# Patient Record
Sex: Male | Born: 1980
Health system: Southern US, Community
[De-identification: ages and names within clinical notes are randomized; demographics above are authoritative.]

## PROBLEM LIST (undated history)

## (undated) DIAGNOSIS — R002 Palpitations: Secondary | ICD-10-CM

## (undated) DIAGNOSIS — I7789 Other specified disorders of arteries and arterioles: Secondary | ICD-10-CM

## (undated) DIAGNOSIS — I441 Atrioventricular block, second degree: Secondary | ICD-10-CM

## (undated) DIAGNOSIS — Z95810 Presence of automatic (implantable) cardiac defibrillator: Secondary | ICD-10-CM

## (undated) DIAGNOSIS — F329 Major depressive disorder, single episode, unspecified: Secondary | ICD-10-CM

## (undated) DIAGNOSIS — I4892 Unspecified atrial flutter: Secondary | ICD-10-CM

## (undated) DIAGNOSIS — I480 Paroxysmal atrial fibrillation: Secondary | ICD-10-CM

## (undated) DIAGNOSIS — F419 Anxiety disorder, unspecified: Secondary | ICD-10-CM

## (undated) DIAGNOSIS — I5022 Chronic systolic (congestive) heart failure: Secondary | ICD-10-CM

## (undated) DIAGNOSIS — J302 Other seasonal allergic rhinitis: Secondary | ICD-10-CM

## (undated) DIAGNOSIS — T7840XA Allergy, unspecified, initial encounter: Secondary | ICD-10-CM

## (undated) DIAGNOSIS — I428 Other cardiomyopathies: Secondary | ICD-10-CM

## (undated) DIAGNOSIS — F32A Depression, unspecified: Secondary | ICD-10-CM

## (undated) DIAGNOSIS — I10 Essential (primary) hypertension: Secondary | ICD-10-CM

## (undated) DIAGNOSIS — I484 Atypical atrial flutter: Secondary | ICD-10-CM

## (undated) DIAGNOSIS — G4733 Obstructive sleep apnea (adult) (pediatric): Principal | ICD-10-CM

## (undated) DIAGNOSIS — G4730 Sleep apnea, unspecified: Secondary | ICD-10-CM

## (undated) DIAGNOSIS — I152 Hypertension secondary to endocrine disorders: Secondary | ICD-10-CM

## (undated) DIAGNOSIS — Z7901 Long term (current) use of anticoagulants: Secondary | ICD-10-CM

## (undated) DIAGNOSIS — E1159 Type 2 diabetes mellitus with other circulatory complications: Secondary | ICD-10-CM

## (undated) DIAGNOSIS — L309 Dermatitis, unspecified: Secondary | ICD-10-CM

## (undated) HISTORY — DX: Other seasonal allergic rhinitis: J30.2

## (undated) HISTORY — DX: Obstructive sleep apnea (adult) (pediatric): G47.33

## (undated) HISTORY — DX: Other cardiomyopathies: I42.8

## (undated) HISTORY — DX: Palpitations: R00.2

## (undated) HISTORY — DX: Other specified disorders of arteries and arterioles: I77.89

## (undated) HISTORY — DX: Atypical atrial flutter: I48.4

## (undated) HISTORY — DX: Anxiety disorder, unspecified: F41.9

## (undated) HISTORY — DX: Chronic systolic (congestive) heart failure: I50.22

## (undated) HISTORY — DX: Hypertension secondary to endocrine disorders: I15.2

## (undated) HISTORY — DX: Sleep apnea, unspecified: G47.30

## (undated) HISTORY — DX: Allergy, unspecified, initial encounter: T78.40XA

## (undated) HISTORY — DX: Depression, unspecified: F32.A

## (undated) HISTORY — DX: Long term (current) use of anticoagulants: Z79.01

## (undated) HISTORY — DX: Type 2 diabetes mellitus with other circulatory complications: E11.59

---
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
## 2009-03-19 ENCOUNTER — Inpatient Hospital Stay (HOSPITAL_COMMUNITY): Admission: EM | Admit: 2009-03-19 | Discharge: 2009-03-26 | Payer: Self-pay | Admitting: Cardiology

## 2009-03-19 ENCOUNTER — Encounter: Payer: Self-pay | Admitting: Emergency Medicine

## 2009-03-19 IMAGING — CR DG CHEST 2V
2 series · 2 of 2 positions shown · non-contrast
Comparison: None

CLINICAL DATA: Shortness of breath and cough.

CHEST - 2 VIEW

[w chest pa]
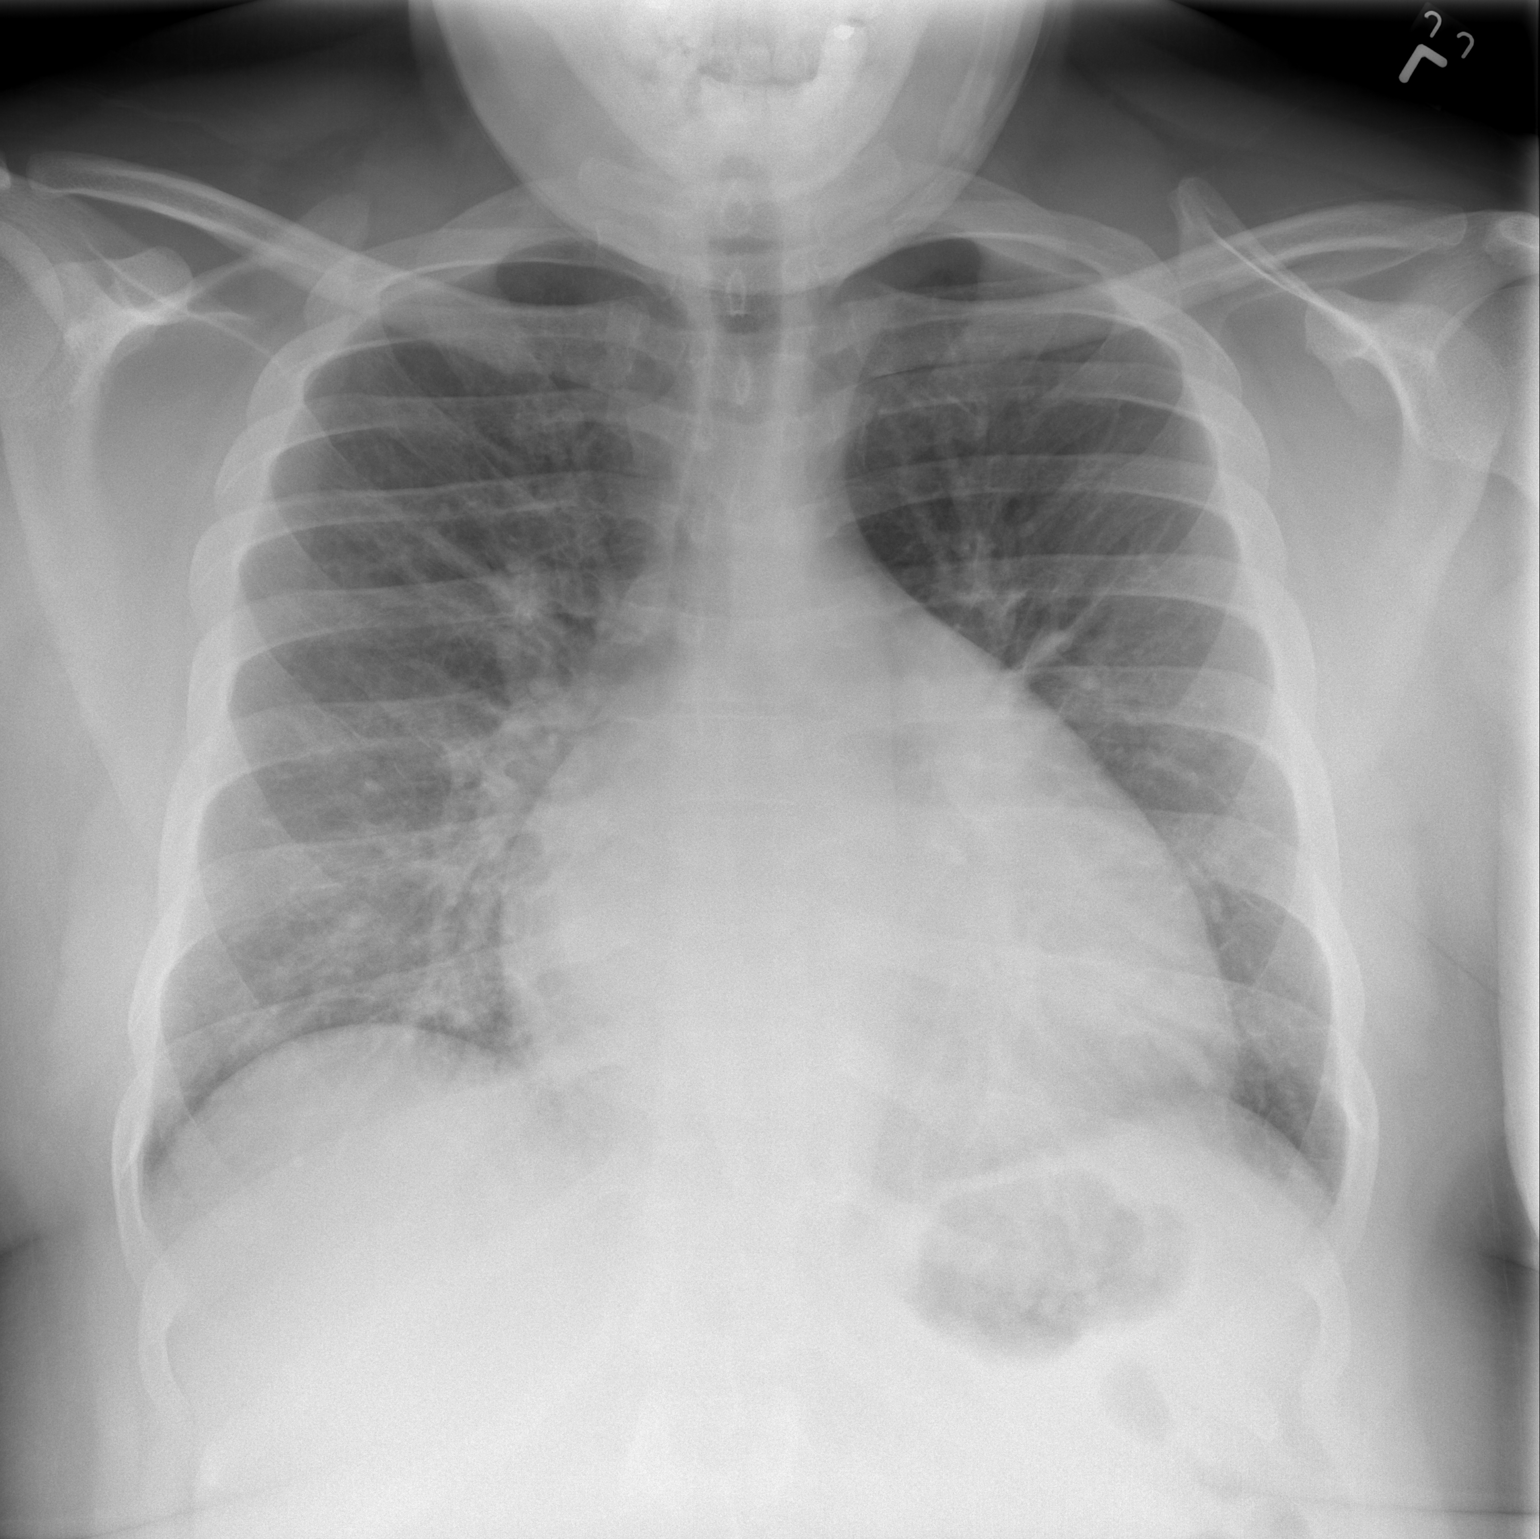

[w chest lat *]
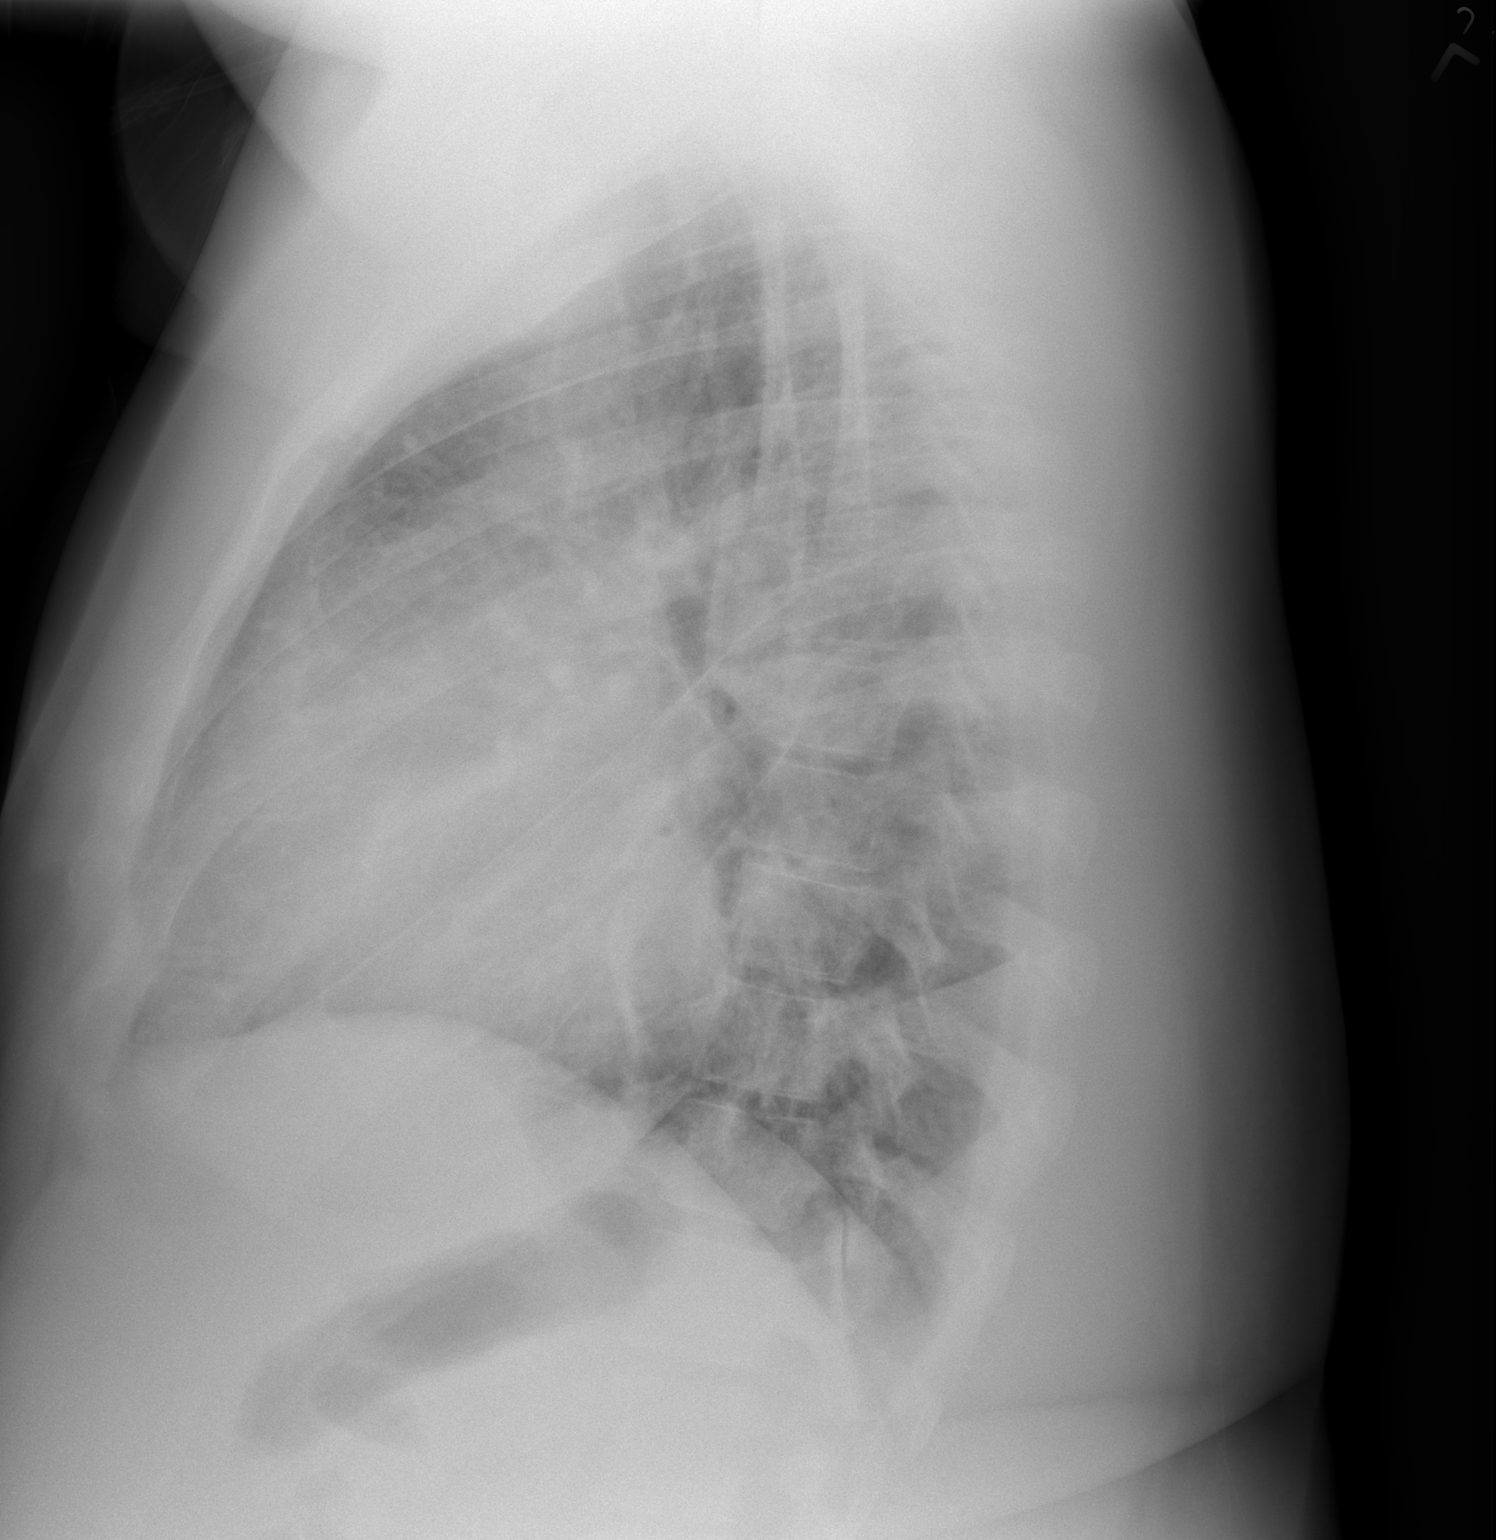

[2 of 2 positions shown; findings below may reference images not displayed]

FINDINGS: The patient has marked cardiomegaly with bilateral
interstitial pulmonary edema, mild.  There are no effusions.  The
globular configuration heart suggests that the patient could have a
pericardial effusion.

Bony structures are normal.
IMPRESSION: Marked enlargement of the cardiac silhouette  with mild vascular
congestion and mild interstitial edema.

## 2009-03-20 ENCOUNTER — Encounter (INDEPENDENT_AMBULATORY_CARE_PROVIDER_SITE_OTHER): Payer: Self-pay | Admitting: Cardiology

## 2009-03-20 IMAGING — CT CT ANGIO CHEST
4 of 6 series · 14 of 30 positions shown · IV contrast (100 ML OMNI 300)
Comparison: Chest radiograph 03/19/2009.

CLINICAL DATA: 28-year-old male with chest pain and shortness of
breath.  History of congestive heart failure.

CT ANGIOGRAPHY CHEST WITH CONTRAST
TECHNIQUE: Multidetector CT imaging of the chest was performed
using the standard protocol during bolus administration of
intravenous contrast. Multiplanar CT image reconstructions
including MIPs were obtained to evaluate the vascular anatomy.
Contrast: 100 ml [id number].

[Series 2: pe · axial · 0.77mm/px · z∈[-285,-78]mm · 6 of 250 slices shown]
[im 42/250  lung]
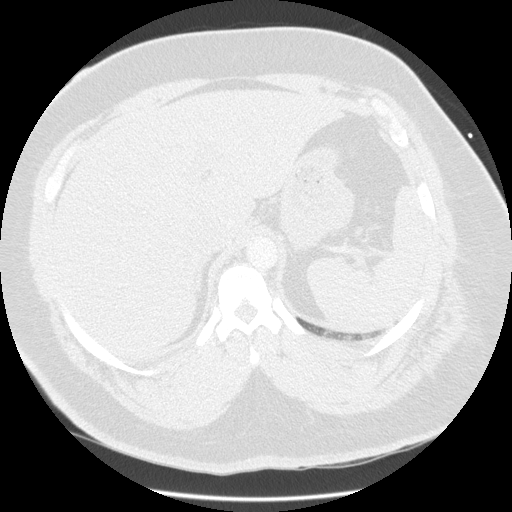
[im 84/250  mediastinal]
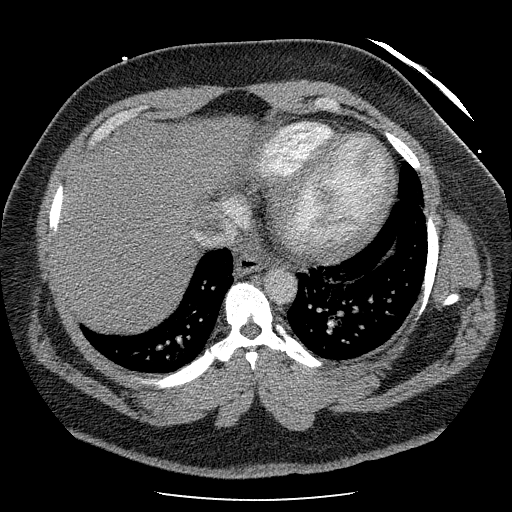
[im 125/250  lung]
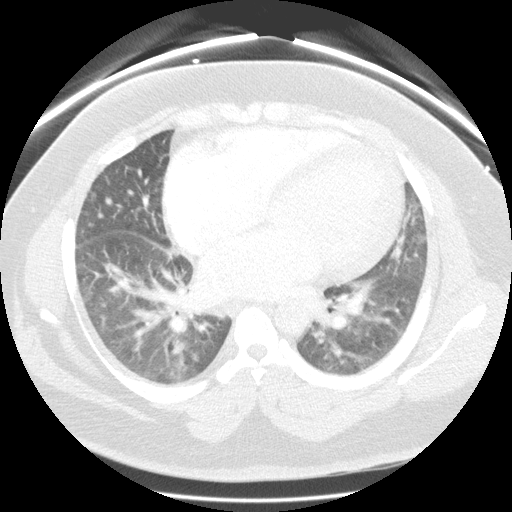
[im 135/250  mediastinal]
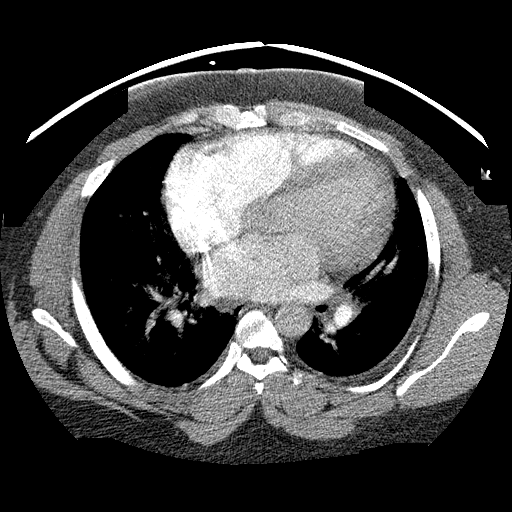
[im 167/250  lung]
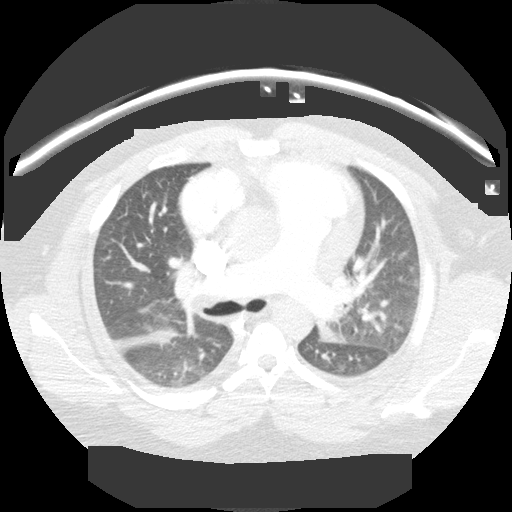
[im 208/250  mediastinal]
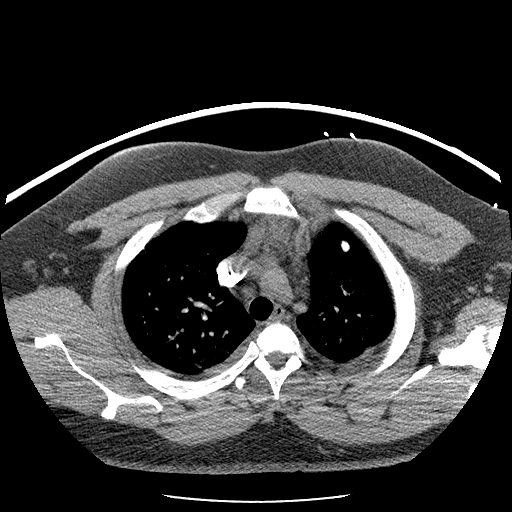

[Series 3: recon 2: pe · axial · 0.77mm/px · z∈[-180,-168]mm · 2 of 125 slices shown]
[im 63/125  lung]
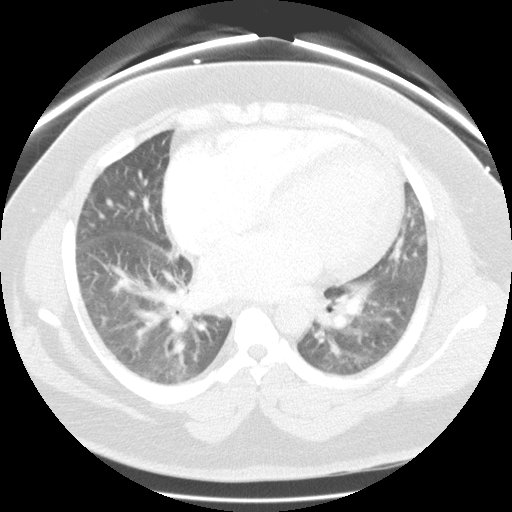
[im 68/125  lung]
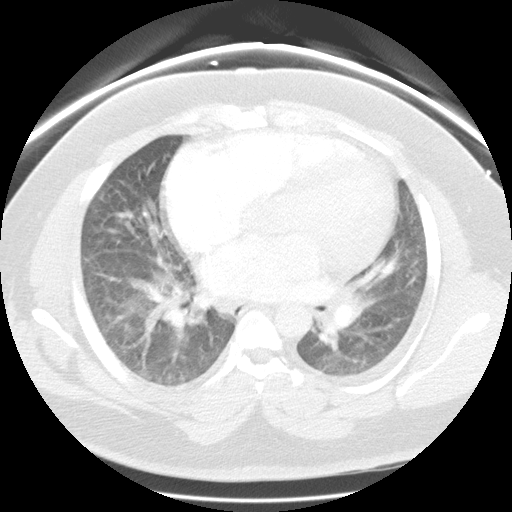

[Series 202: reformatted · sagittal · 0.77mm/px · 3 of 197 slices shown (1 of 2)]
[im 50/197  lung]
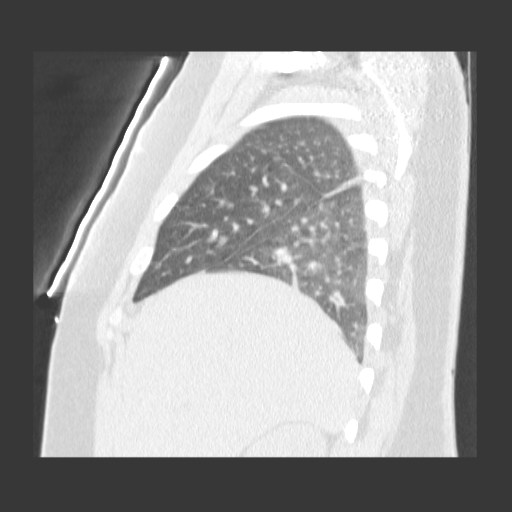
[im 99/197  lung]
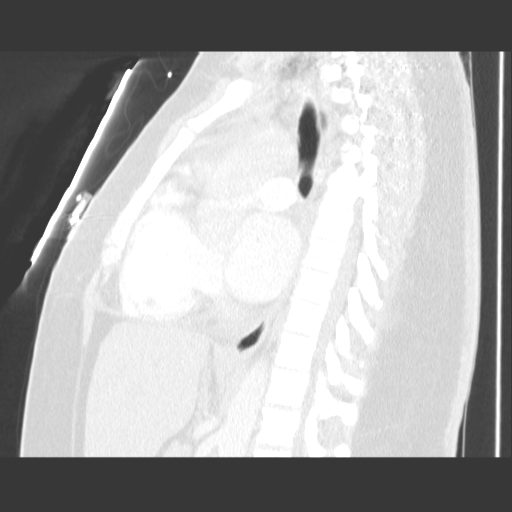
[im 148/197  lung]
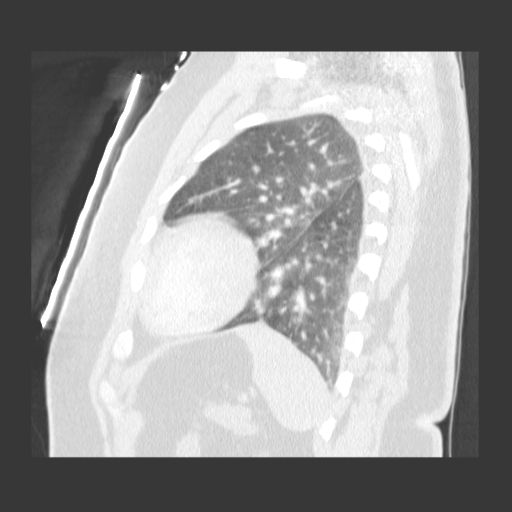

[Series 204: reformatted · sagittal · 0.77mm/px · 3 of 188 slices shown (2 of 2)]
[im 47/188  lung]
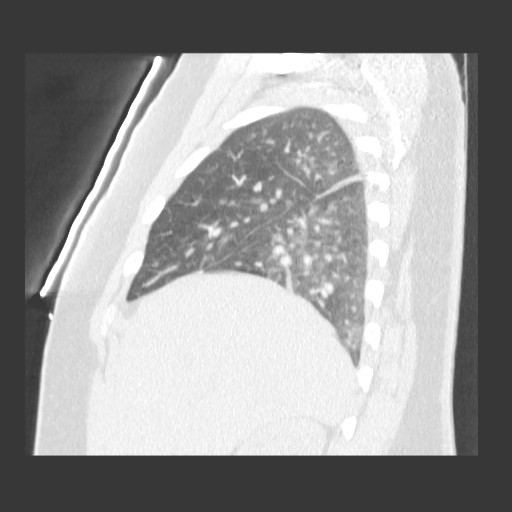
[im 94/188  lung]
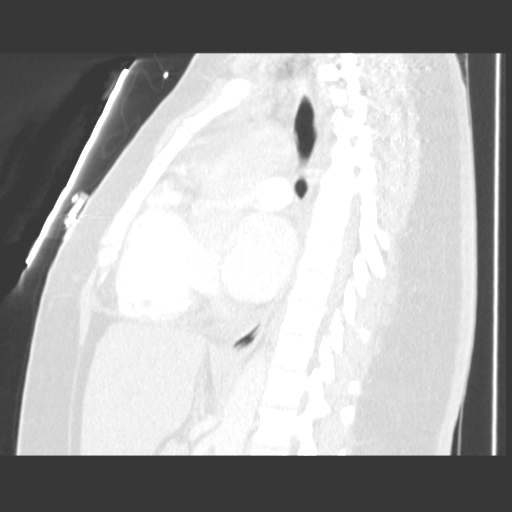
[im 141/188  lung]
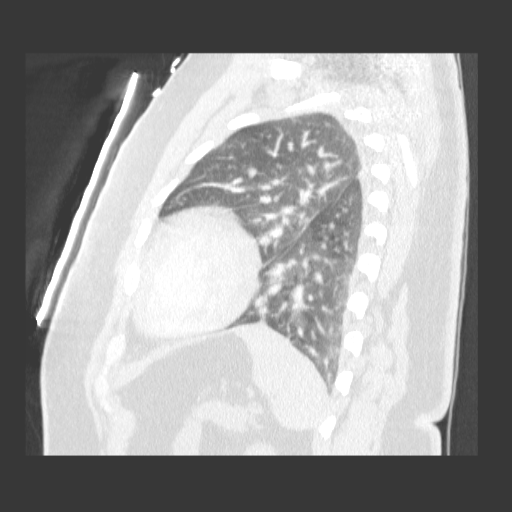

[14 of 30 positions shown; findings below may reference images not displayed]

FINDINGS: Adequate contrast bolus timing in the pulmonary arterial
tree.  Mild respiratory motion artifact. No focal filling defect
identified in the pulmonary arterial tree to suggest the presence
of acute pulmonary embolism.

Diffuse ground-glass opacity with superimposed dependent
atelectasis and mild peribronchovascular atelectasis bilaterally.
No airspace consolidation.  Major airways are patent.

Marked cardiomegaly.  No pericardial or pleural effusion.  Probable
residual thymus in the superior mediastinum given this patient's
age.  No lymphadenopathy.  Visualized upper abdominal viscera are
within normal limits.

No acute osseous abnormality identified.

Review of the MIP images confirms the above findings.
IMPRESSION: 1. No evidence of acute pulmonary embolus.
2.  Marked cardiomegaly.  Probable acute pulmonary edema with
superimposed atelectasis.  No pleural or pericardial effusion.

## 2009-03-20 IMAGING — CR DG CHEST 1V PORT
1 series · 1 of 1 positions shown · non-contrast
Comparison: 03/19/2009

CLINICAL DATA: Atrial flutter and CHF.

PORTABLE CHEST - 1 VIEW

[AP]
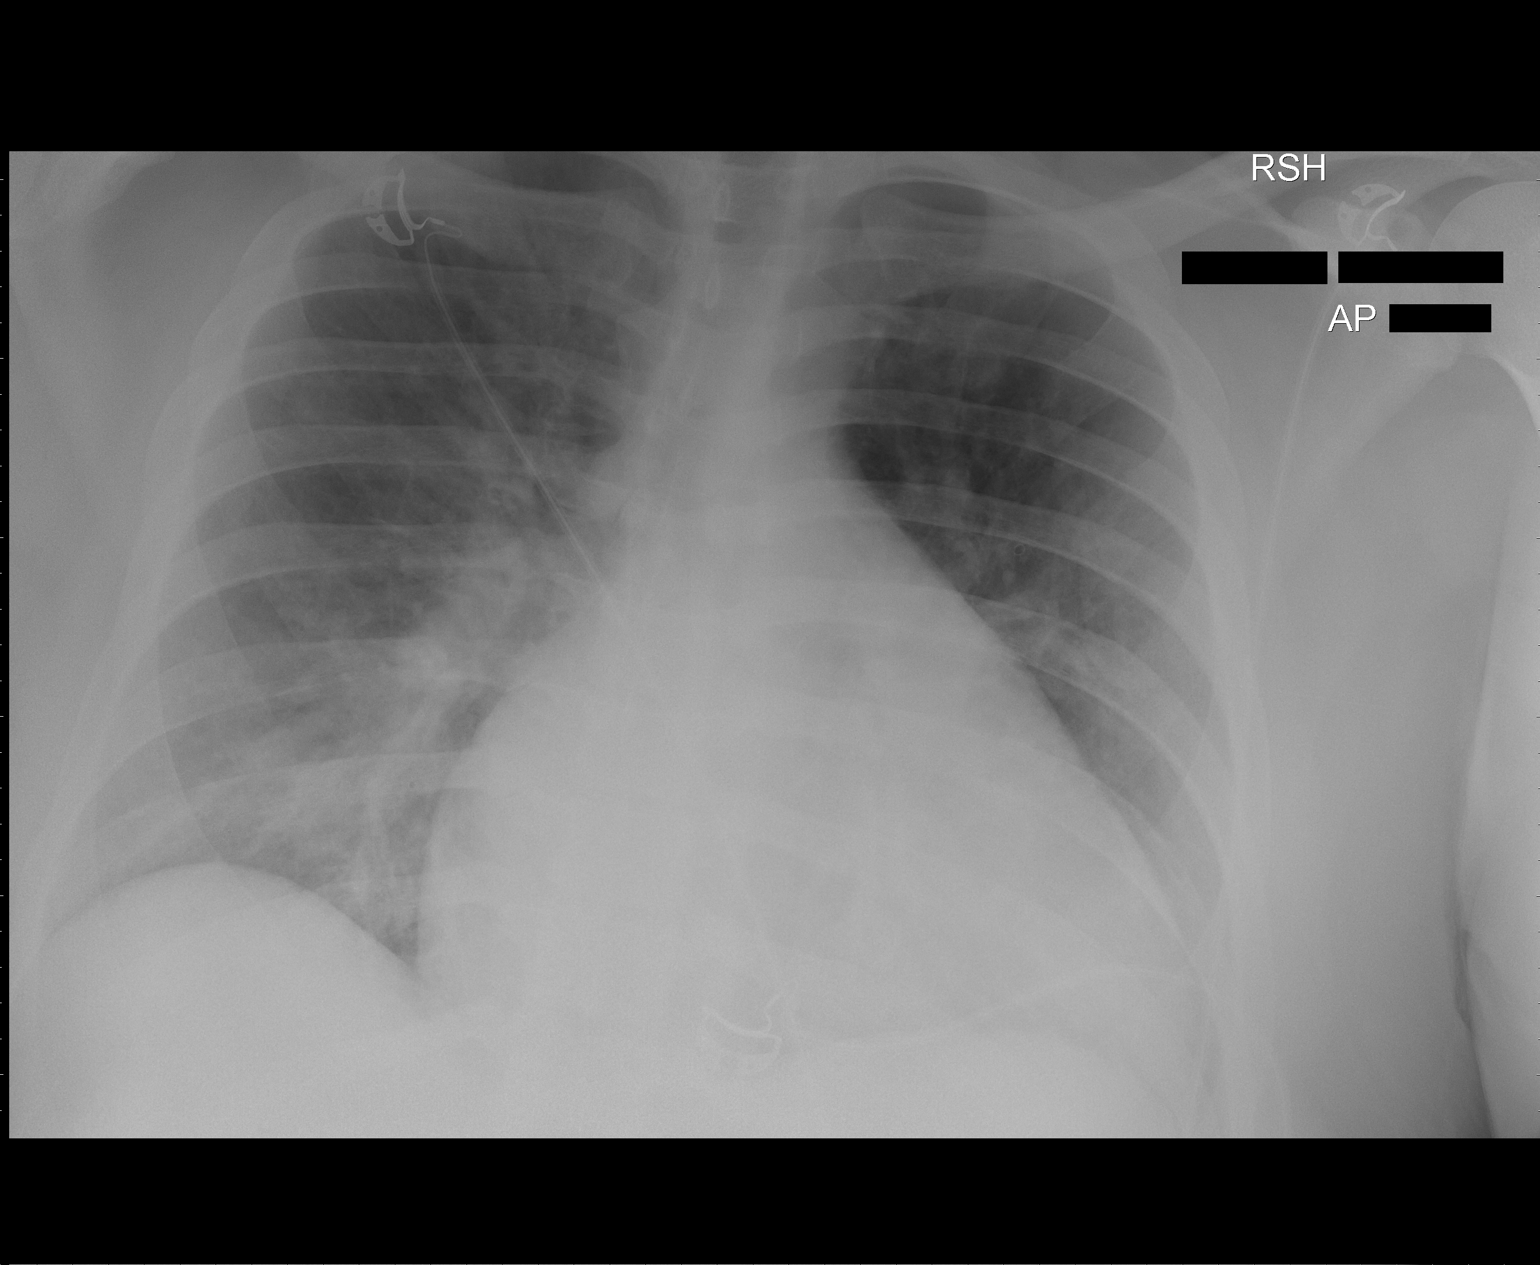

[1 of 1 positions shown; findings below may reference images not displayed]

FINDINGS: The heart remains enlarged with persistent vascular
congestion and mild interstitial edema.  No pleural effusions.
Streaky bibasilar atelectasis.  No pneumothorax.
IMPRESSION: Stable cardiac enlargement, vascular congestion and mild
interstitial edema.

## 2009-03-21 IMAGING — CR DG CHEST 1V PORT
1 series · 1 of 1 positions shown · non-contrast
Comparison: Portable chest x-ray of 03/20/2009

CLINICAL DATA: Atrial flutter, hypotension

PORTABLE CHEST - 1 VIEW

[view not recorded]
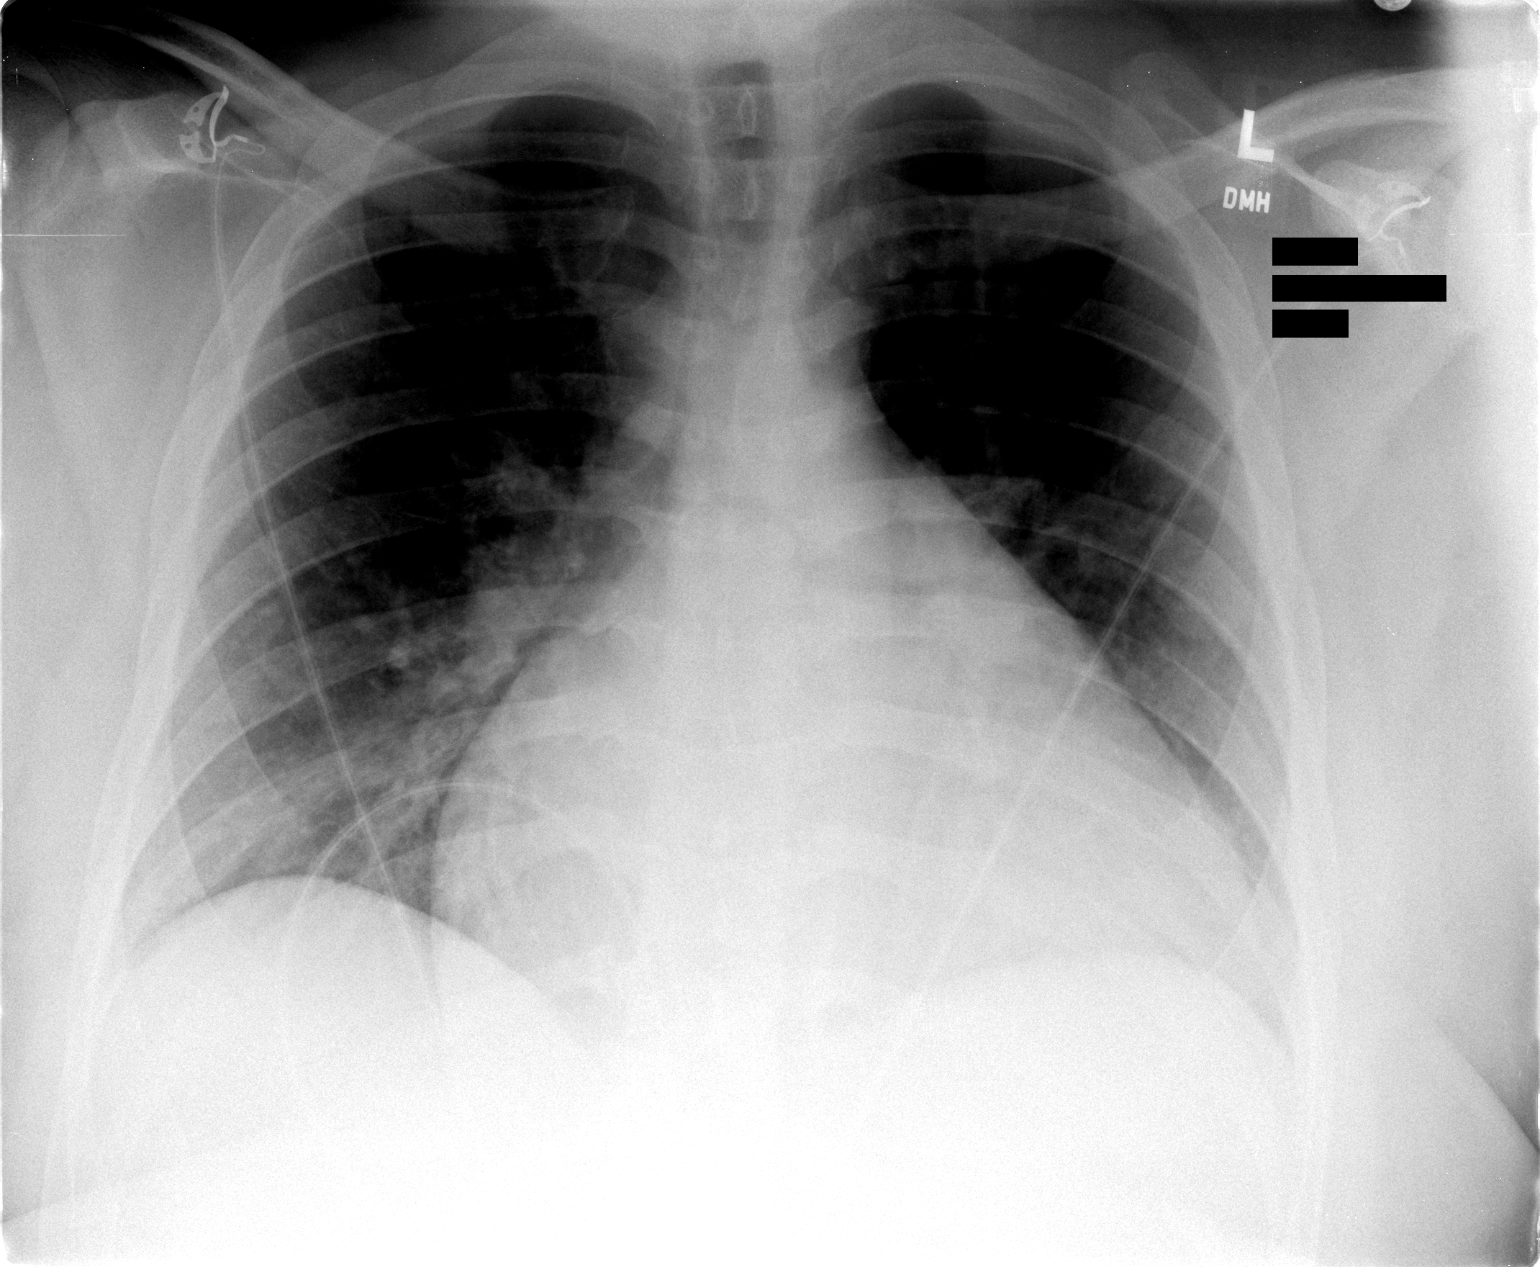

[1 of 1 positions shown; findings below may reference images not displayed]

FINDINGS: Moderate cardiomegaly is stable.  The degree of pulmonary
vascular congestion has improved slightly.  No effusion is seen.
No bony abnormality is noted.
IMPRESSION: Stable moderate cardiomegaly.  Some improvement in pulmonary
vascular congestion.

## 2009-03-24 ENCOUNTER — Encounter (INDEPENDENT_AMBULATORY_CARE_PROVIDER_SITE_OTHER): Payer: Self-pay | Admitting: Cardiology

## 2009-04-14 ENCOUNTER — Ambulatory Visit: Admission: RE | Admit: 2009-04-14 | Discharge: 2009-04-14 | Payer: Self-pay | Admitting: Cardiology

## 2010-02-10 ENCOUNTER — Encounter: Admission: RE | Admit: 2010-02-10 | Discharge: 2010-02-10 | Payer: Self-pay | Admitting: Cardiovascular Disease

## 2010-02-10 IMAGING — CR DG CHEST 2V
2 series · 2 of 2 positions shown · non-contrast
Comparison: 03/21/2009

CLINICAL DATA: Congestive heart failure.  Atrial fibrillation.

CHEST - 2 VIEW

[w chest pa]
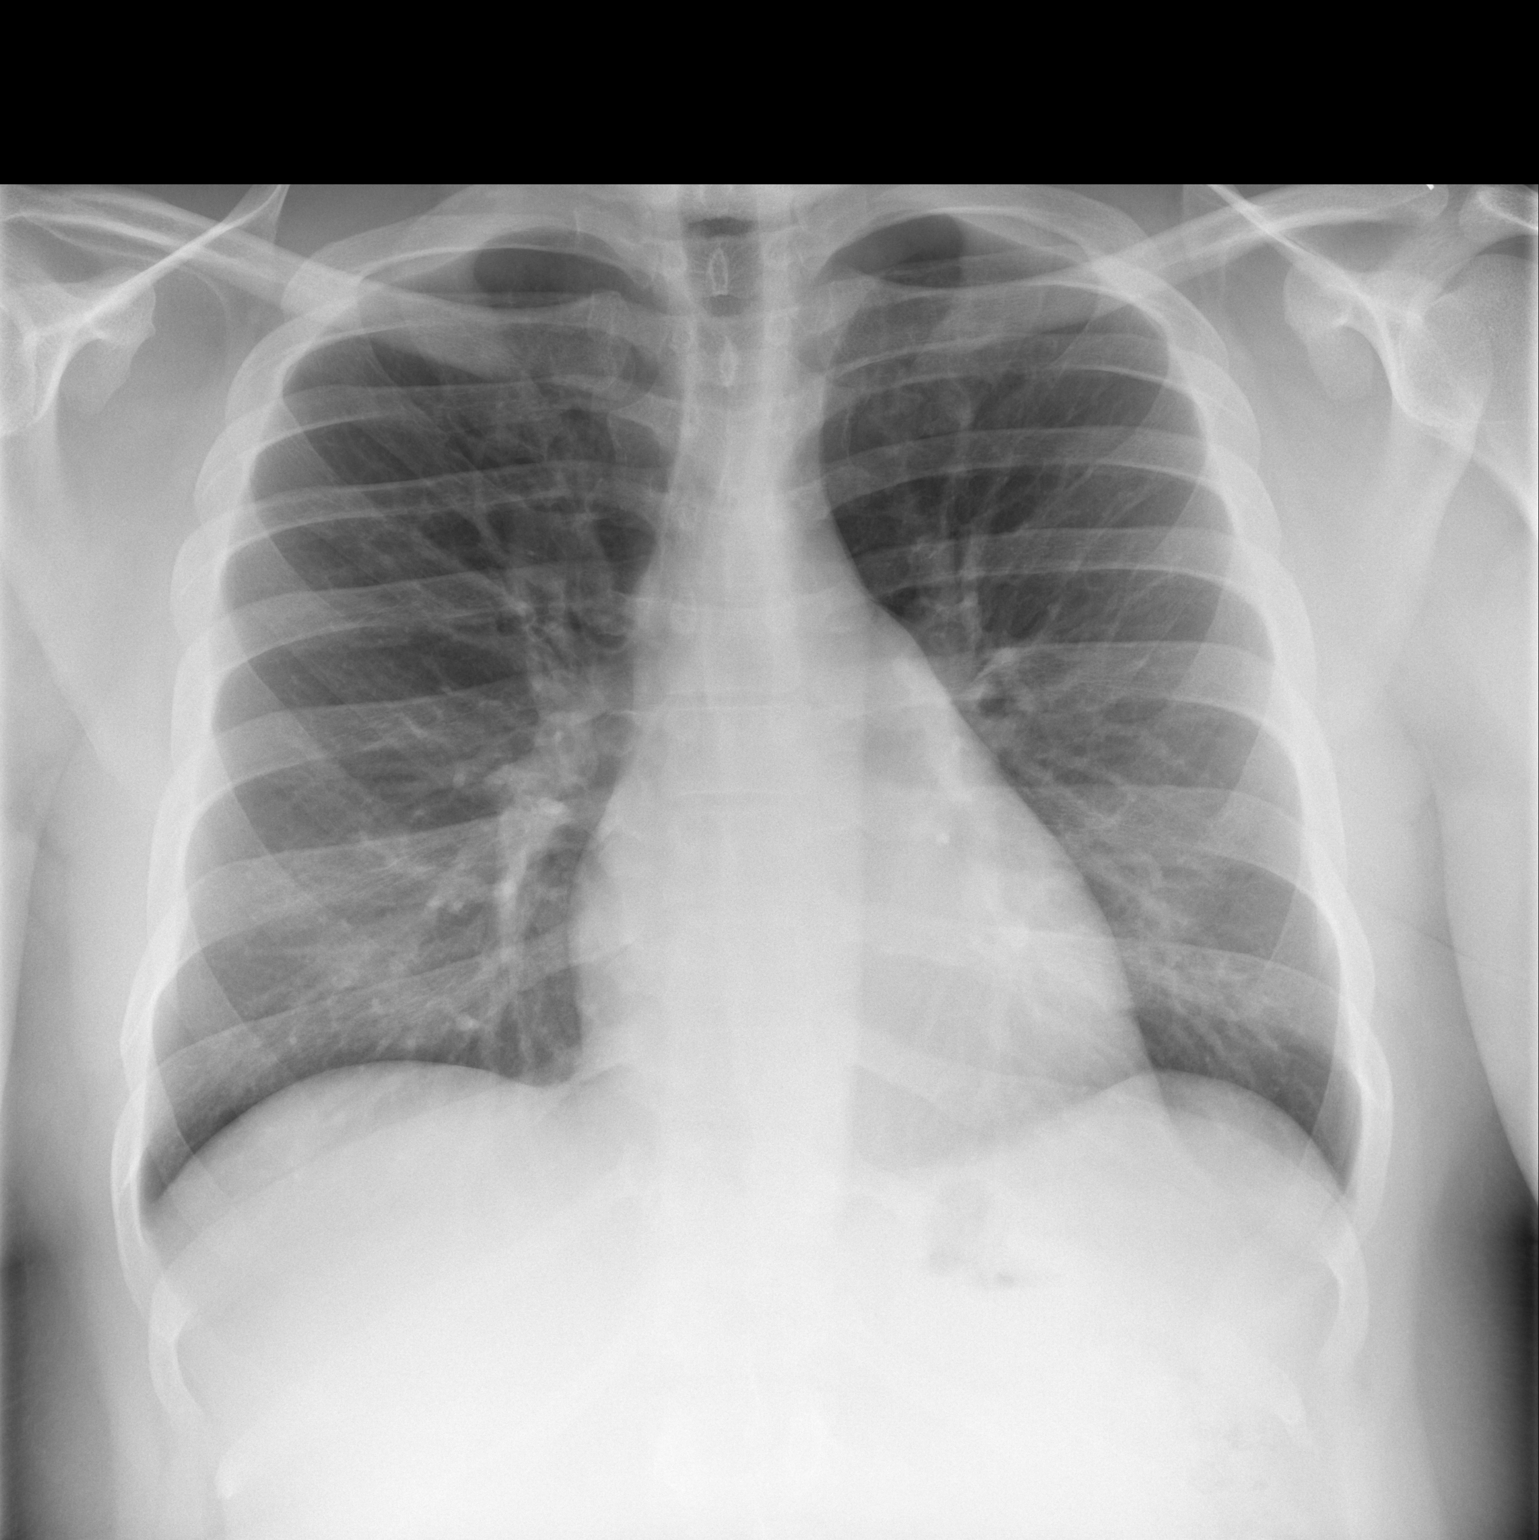

[w chest lat]
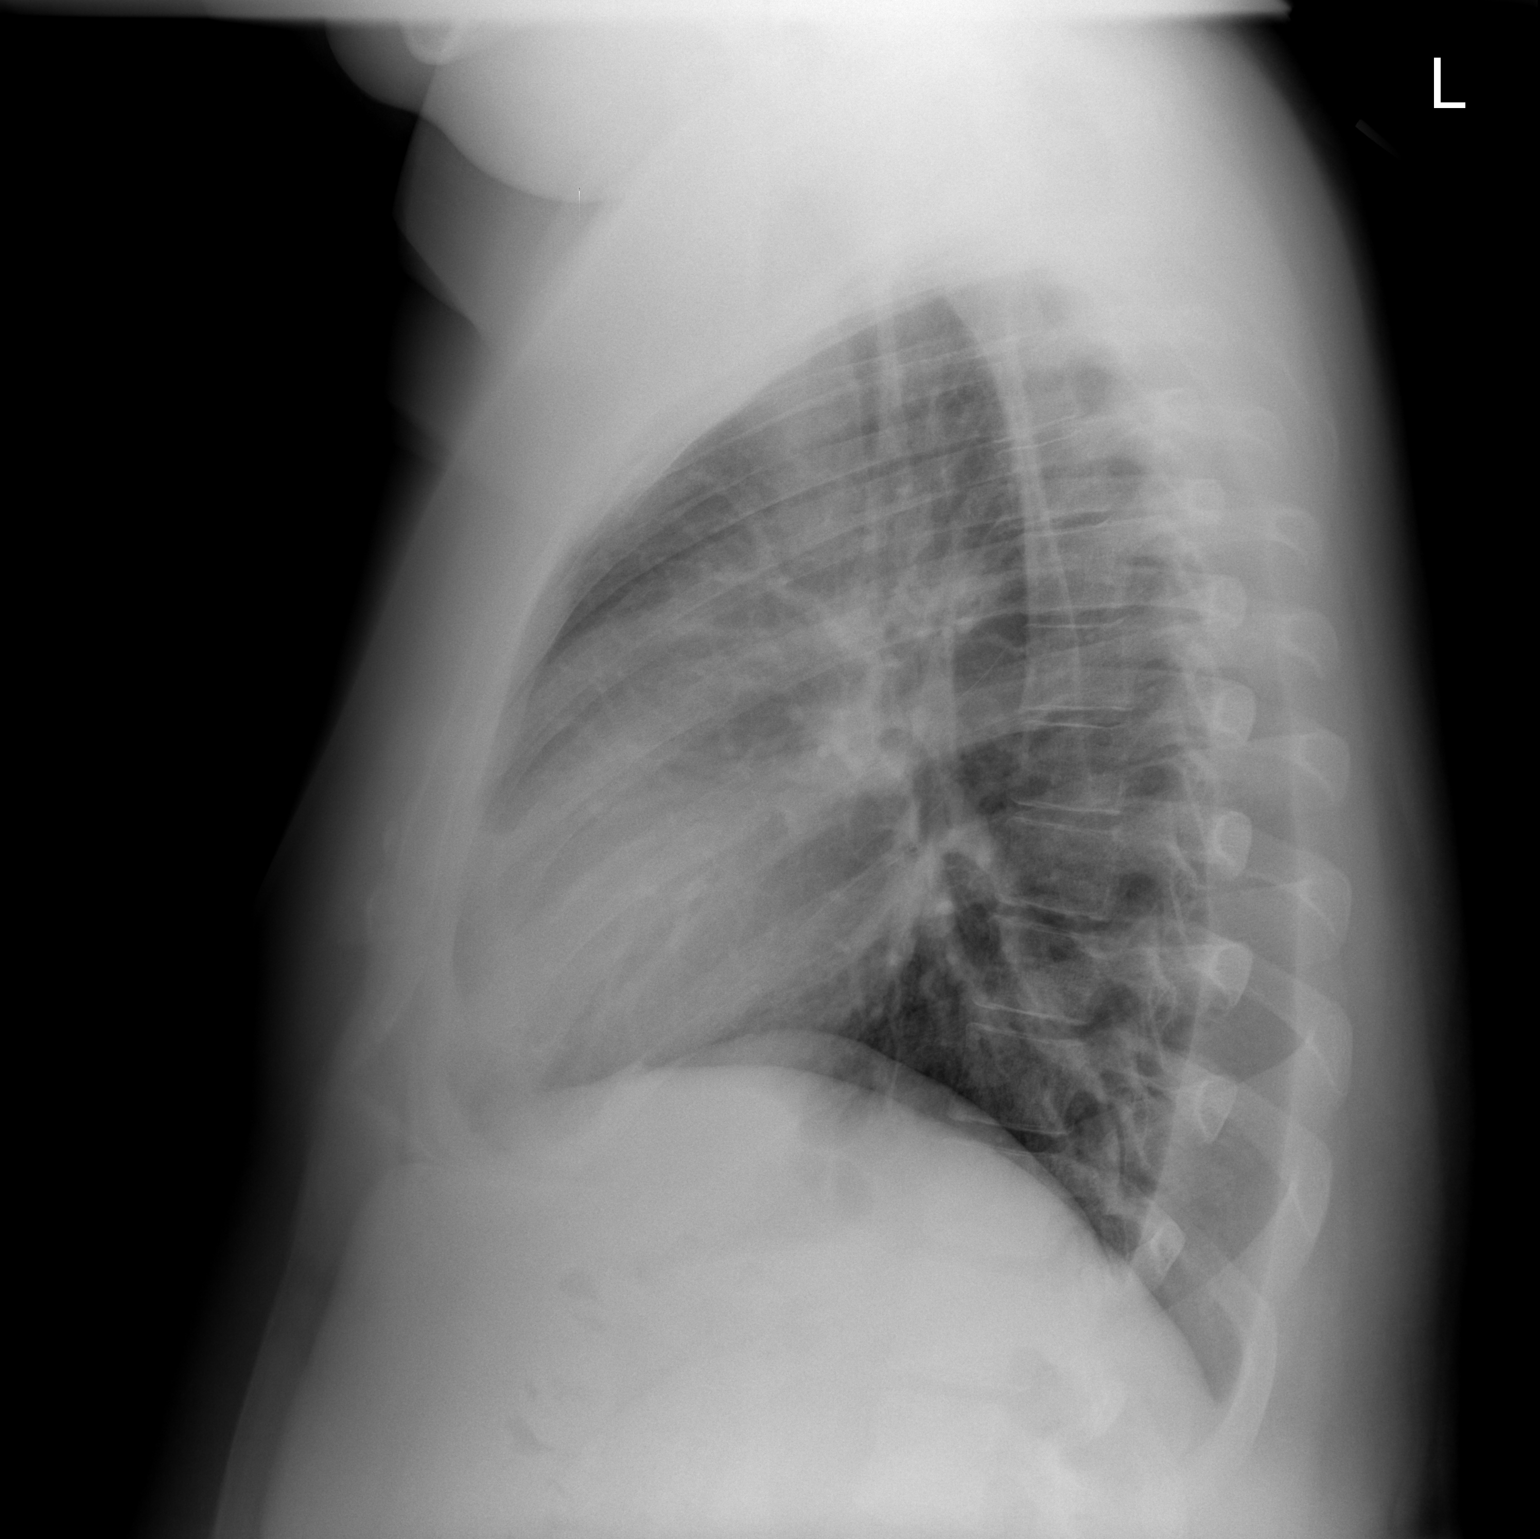

[2 of 2 positions shown; findings below may reference images not displayed]

FINDINGS: Significant decrease in size of cardiac silhouette seen
since prior exam.  The heart size and mediastinal contours are
within normal limits.  Both lungs are clear.  The visualized
skeletal structures are unremarkable.
IMPRESSION: No active cardiopulmonary disease.

## 2010-02-16 ENCOUNTER — Ambulatory Visit (HOSPITAL_COMMUNITY)
Admission: RE | Admit: 2010-02-16 | Discharge: 2010-02-17 | Payer: Self-pay | Source: Home / Self Care | Admitting: Cardiovascular Disease

## 2010-02-17 HISTORY — PX: CARDIAC DEFIBRILLATOR PLACEMENT: SHX171

## 2010-02-17 IMAGING — CR DG CHEST 2V
2 series · 2 of 2 positions shown · non-contrast
Comparison: 02/10/2010.

CLINICAL DATA: Congestive heart failure.  Status post pacemaker
placement.

CHEST - 2 VIEW

[w chest pa]
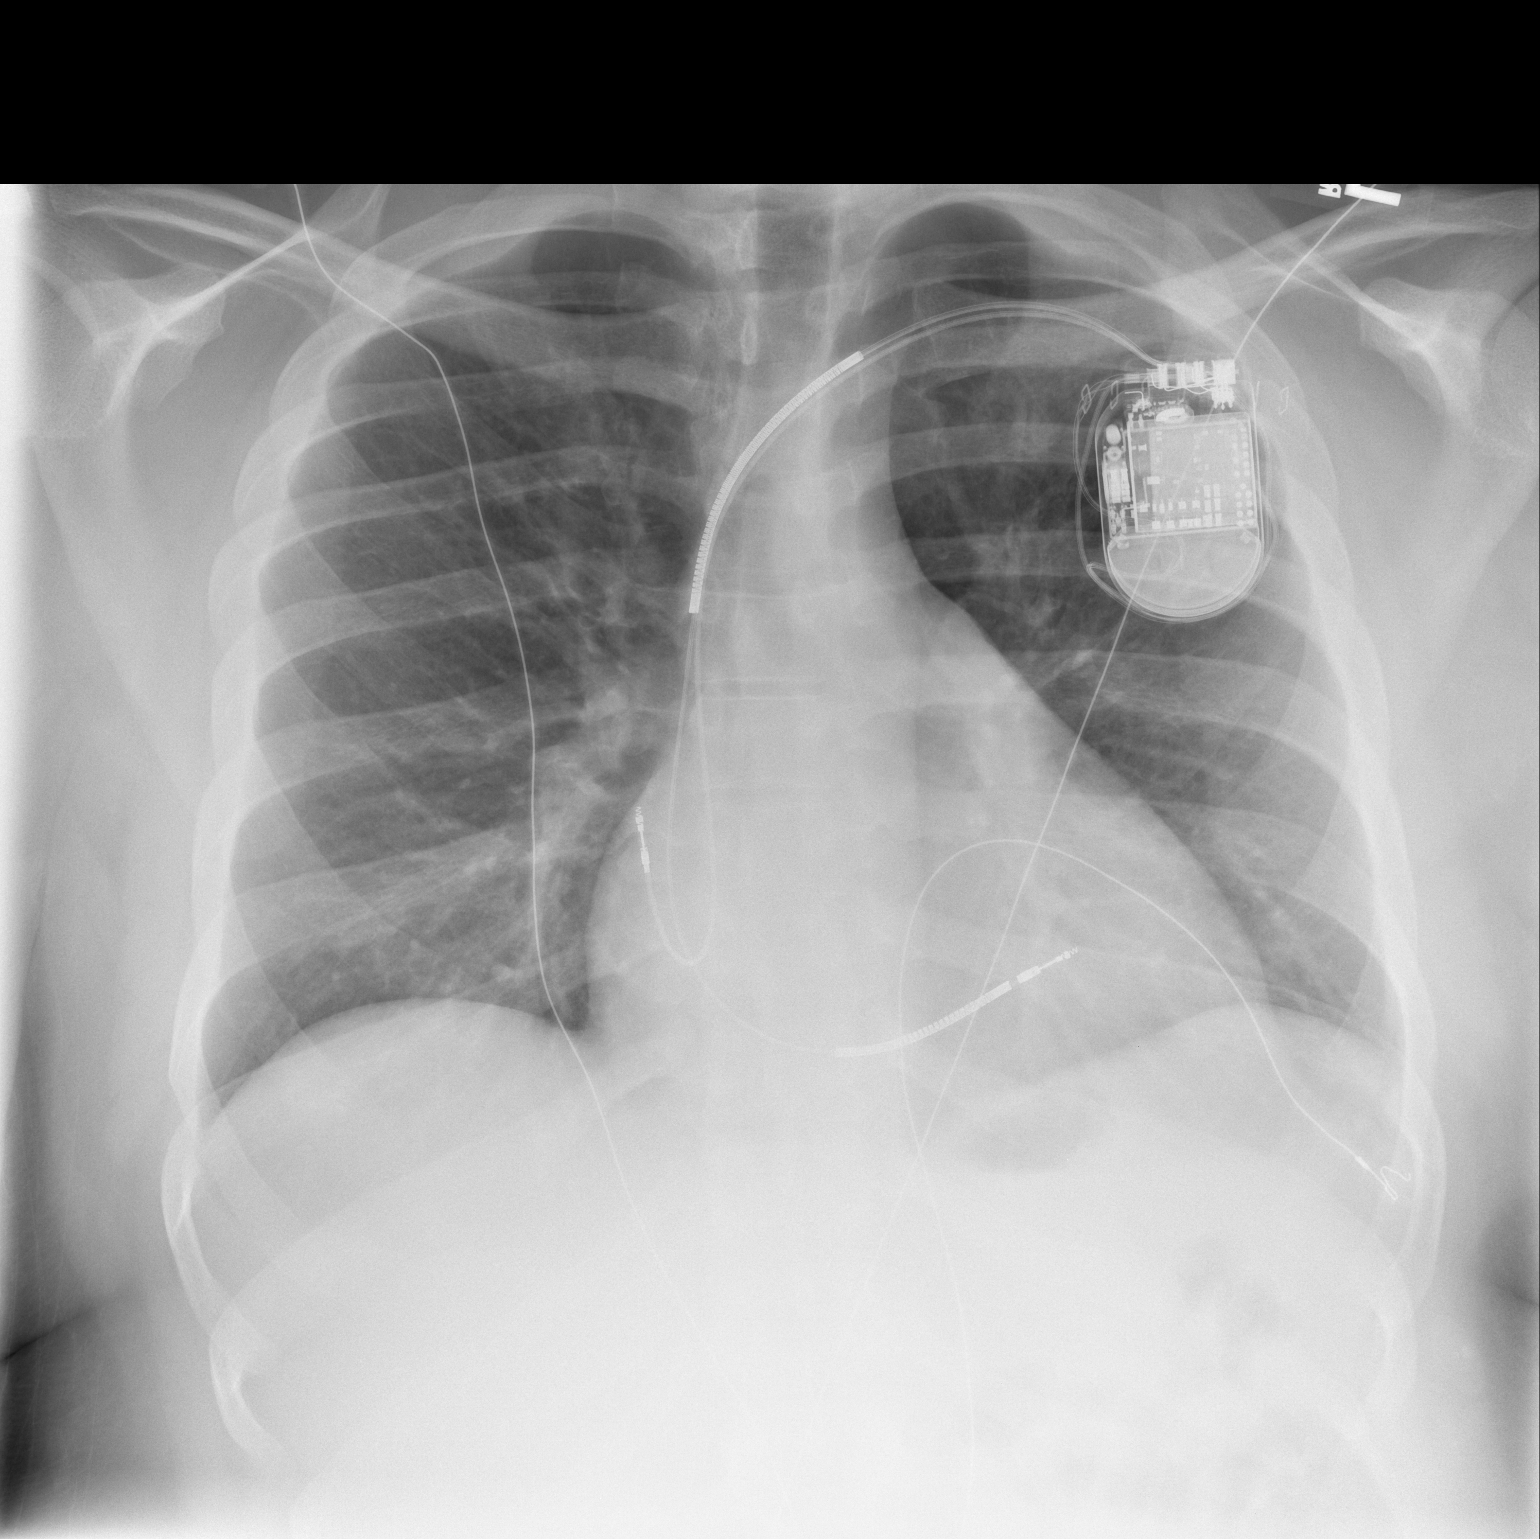

[w chest lat]
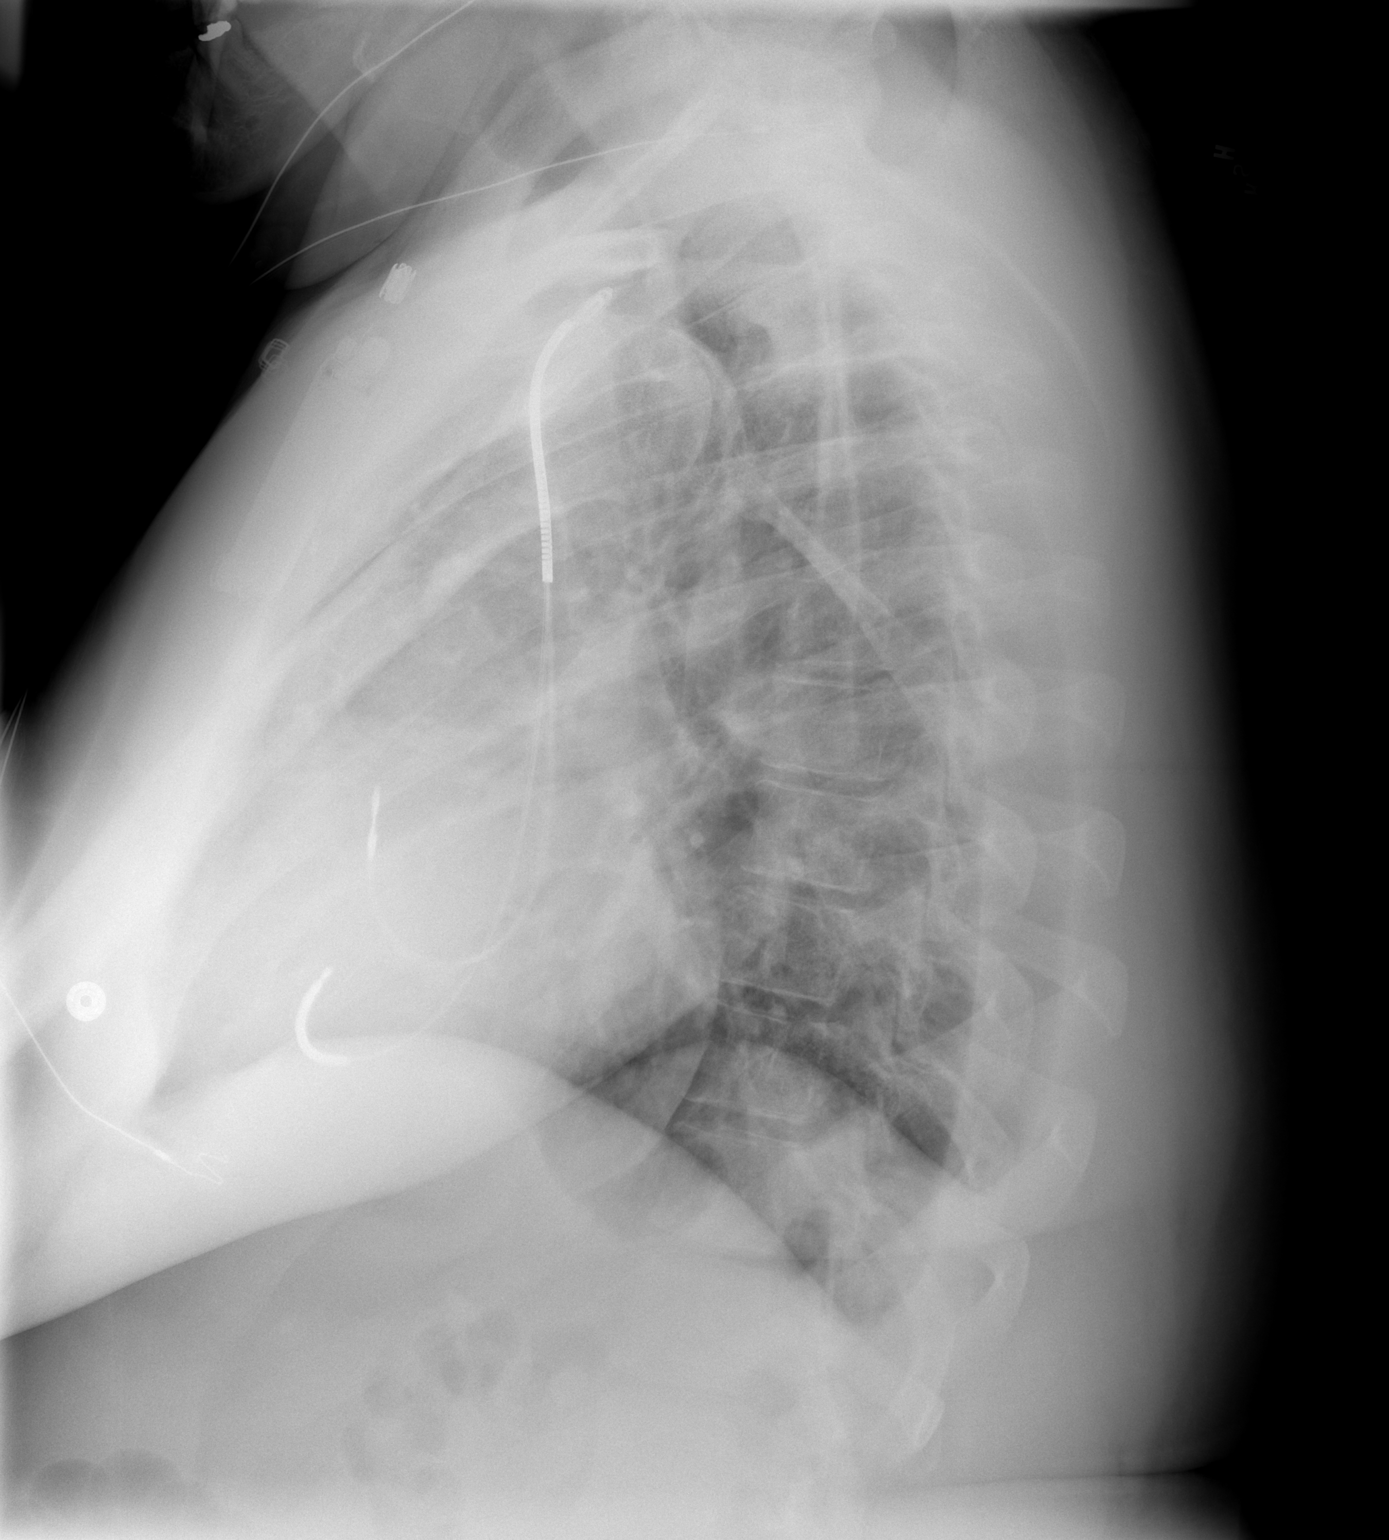

[2 of 2 positions shown; findings below may reference images not displayed]

FINDINGS: Heart size is mildly enlarged.  A dual lead pacemaker /
AICD has been placed.  The lung volumes are low.  Minimal bibasilar
atelectasis is evident.  There is no pneumothorax.  The visualized
soft tissues and bony thorax are unremarkable.
IMPRESSION: 1.  Interval placement of a dual lead pacemaker / AICD via a
subclavian approach without radiographic evidence for complication.
2.  Low volumes and mild bibasilar atelectasis.

## 2010-07-23 LAB — PROTIME-INR
INR: 1.18 (ref 0.00–1.49)
Prothrombin Time: 15.2 seconds (ref 11.6–15.2)

## 2010-07-23 LAB — SURGICAL PCR SCREEN: MRSA, PCR: NEGATIVE

## 2010-08-12 LAB — HEPATIC FUNCTION PANEL
ALT: 27 U/L (ref 0–53)
Alkaline Phosphatase: 35 U/L — ABNORMAL LOW (ref 39–117)
Indirect Bilirubin: 1.2 mg/dL — ABNORMAL HIGH (ref 0.3–0.9)
Total Protein: 7.1 g/dL (ref 6.0–8.3)

## 2010-08-12 LAB — IRON AND TIBC
Saturation Ratios: 16 % — ABNORMAL LOW (ref 20–55)
TIBC: 278 ug/dL (ref 215–435)

## 2010-08-12 LAB — APTT: aPTT: 32 seconds (ref 24–37)

## 2010-08-12 LAB — CBC
HCT: 43.6 % (ref 39.0–52.0)
HCT: 37.8 % — ABNORMAL LOW (ref 39.0–52.0)
HCT: 39.1 % (ref 39.0–52.0)
HCT: 40.5 % (ref 39.0–52.0)
HCT: 40.8 % (ref 39.0–52.0)
Hemoglobin: 12.4 g/dL — ABNORMAL LOW (ref 13.0–17.0)
Hemoglobin: 12.8 g/dL — ABNORMAL LOW (ref 13.0–17.0)
Hemoglobin: 13.4 g/dL (ref 13.0–17.0)
MCHC: 34 g/dL (ref 30.0–36.0)
MCV: 84.5 fL (ref 78.0–100.0)
MCV: 84.8 fL (ref 78.0–100.0)
Platelets: 253 10*3/uL (ref 150–400)
Platelets: 271 10*3/uL (ref 150–400)
Platelets: 214 10*3/uL (ref 150–400)
Platelets: 222 10*3/uL (ref 150–400)
Platelets: 251 10*3/uL (ref 150–400)
RBC: 4.78 MIL/uL (ref 4.22–5.81)
RBC: 4.82 MIL/uL (ref 4.22–5.81)
RDW: 14.3 % (ref 11.5–15.5)
RDW: 14.5 % (ref 11.5–15.5)
RDW: 14.4 % (ref 11.5–15.5)
RDW: 14.4 % (ref 11.5–15.5)
RDW: 14.7 % (ref 11.5–15.5)
RDW: 14.7 % (ref 11.5–15.5)
WBC: 4.2 10*3/uL (ref 4.0–10.5)
WBC: 4.7 10*3/uL (ref 4.0–10.5)
WBC: 4 10*3/uL (ref 4.0–10.5)
WBC: 4.5 10*3/uL (ref 4.0–10.5)
WBC: 4.8 10*3/uL (ref 4.0–10.5)
WBC: 5 10*3/uL (ref 4.0–10.5)
WBC: 5.5 10*3/uL (ref 4.0–10.5)

## 2010-08-12 LAB — RAPID URINE DRUG SCREEN, HOSP PERFORMED
Cocaine: NOT DETECTED
Tetrahydrocannabinol: NOT DETECTED

## 2010-08-12 LAB — BASIC METABOLIC PANEL
BUN: 11 mg/dL (ref 6–23)
Calcium: 9.1 mg/dL (ref 8.4–10.5)
Calcium: 8.4 mg/dL (ref 8.4–10.5)
Chloride: 97 mEq/L (ref 96–112)
Creatinine, Ser: 0.94 mg/dL (ref 0.4–1.5)
GFR calc Af Amer: >60 mL/min (ref >60)
GFR calc non Af Amer: >60 mL/min (ref >60)
GFR calc non Af Amer: >60 mL/min (ref >60)
GFR calc non Af Amer: >60 mL/min (ref >60)
Glucose, Bld: 98 mg/dL (ref 70–99)
Glucose, Bld: 95 mg/dL (ref 70–99)
Potassium: 3.7 mEq/L (ref 3.5–5.1)
Potassium: 3.7 mEq/L (ref 3.5–5.1)
Sodium: 134 mEq/L — ABNORMAL LOW (ref 135–145)
Sodium: 139 mEq/L (ref 135–145)

## 2010-08-12 LAB — POCT CARDIAC MARKERS
CKMB, poc: <1 ng/mL — ABNORMAL LOW (ref 1.0–8.0)
Troponin i, poc: <0.05 ng/mL (ref 0.00–0.09)

## 2010-08-12 LAB — CK TOTAL AND CKMB (NOT AT ARMC)
CK, MB: 1.7 ng/mL (ref 0.3–4.0)
Relative Index: 0.8 (ref 0.0–2.5)
Total CK: 217 U/L (ref 7–232)

## 2010-08-12 LAB — COMPREHENSIVE METABOLIC PANEL
ALT: 33 U/L (ref 0–53)
AST: 18 U/L (ref 0–37)
AST: 19 U/L (ref 0–37)
Albumin: 3.5 g/dL (ref 3.5–5.2)
Albumin: 3.7 g/dL (ref 3.5–5.2)
CO2: 22 mEq/L (ref 19–32)
CO2: 25 mEq/L (ref 19–32)
Calcium: 8.5 mg/dL (ref 8.4–10.5)
Calcium: 8.8 mg/dL (ref 8.4–10.5)
Chloride: 104 mEq/L (ref 96–112)
Creatinine, Ser: 0.94 mg/dL (ref 0.4–1.5)
Creatinine, Ser: 0.97 mg/dL (ref 0.4–1.5)
GFR calc Af Amer: >60 mL/min (ref >60)
GFR calc Af Amer: >60 mL/min (ref >60)
GFR calc non Af Amer: >60 mL/min (ref >60)
GFR calc non Af Amer: >60 mL/min (ref >60)
Sodium: 134 mEq/L — ABNORMAL LOW (ref 135–145)
Sodium: 136 mEq/L (ref 135–145)
Total Bilirubin: 2.1 mg/dL — ABNORMAL HIGH (ref 0.3–1.2)
Total Protein: 6.6 g/dL (ref 6.0–8.3)

## 2010-08-12 LAB — TROPONIN I
Troponin I: 0.02 ng/mL (ref 0.00–0.06)
Troponin I: 0.02 ng/mL (ref 0.00–0.06)

## 2010-08-12 LAB — DIFFERENTIAL
Eosinophils Absolute: 0.2 10*3/uL (ref 0.0–0.7)
Eosinophils Relative: 2 % (ref 0–5)
Eosinophils Relative: 3 % (ref 0–5)
Lymphocytes Relative: 40 % (ref 12–46)
Lymphocytes Relative: 41 % (ref 12–46)
Lymphs Abs: 1.9 10*3/uL (ref 0.7–4.0)
Lymphs Abs: 2.2 10*3/uL (ref 0.7–4.0)
Monocytes Absolute: 0.4 10*3/uL (ref 0.1–1.0)
Monocytes Absolute: 0.6 10*3/uL (ref 0.1–1.0)
Monocytes Relative: 9 % (ref 3–12)
Neutro Abs: 2.3 10*3/uL (ref 1.7–7.7)

## 2010-08-12 LAB — PROTIME-INR
INR: 1.81 — ABNORMAL HIGH (ref 0.00–1.49)
INR: 2.29 — ABNORMAL HIGH (ref 0.00–1.49)
INR: 1.12 (ref 0.00–1.49)
INR: 1.24 (ref 0.00–1.49)
INR: 1.7 — ABNORMAL HIGH (ref 0.00–1.49)
Prothrombin Time: 20.8 seconds — ABNORMAL HIGH (ref 11.6–15.2)
Prothrombin Time: 25 seconds — ABNORMAL HIGH (ref 11.6–15.2)
Prothrombin Time: 14.3 seconds (ref 11.6–15.2)
Prothrombin Time: 15.5 seconds — ABNORMAL HIGH (ref 11.6–15.2)

## 2010-08-12 LAB — LIPID PANEL
HDL: 32 mg/dL — ABNORMAL LOW (ref >39)
Total CHOL/HDL Ratio: 5.3 RATIO

## 2010-08-12 LAB — URINE MICROSCOPIC-ADD ON

## 2010-08-12 LAB — MAGNESIUM
Magnesium: 2 mg/dL (ref 1.5–2.5)
Magnesium: 2.3 mg/dL (ref 1.5–2.5)

## 2010-08-12 LAB — HEMOGLOBIN A1C: Mean Plasma Glucose: 123 mg/dL

## 2010-08-12 LAB — BRAIN NATRIURETIC PEPTIDE
Pro B Natriuretic peptide (BNP): 129 pg/mL — ABNORMAL HIGH (ref 0.0–100.0)
Pro B Natriuretic peptide (BNP): 172 pg/mL — ABNORMAL HIGH (ref 0.0–100.0)
Pro B Natriuretic peptide (BNP): 185 pg/mL — ABNORMAL HIGH (ref 0.0–100.0)

## 2010-08-12 LAB — URINALYSIS, ROUTINE W REFLEX MICROSCOPIC
Hgb urine dipstick: NEGATIVE
Leukocytes, UA: NEGATIVE
Urobilinogen, UA: 1 mg/dL (ref 0.0–1.0)

## 2010-08-12 LAB — HEPARIN LEVEL (UNFRACTIONATED)
Heparin Unfractionated: 0.44 IU/mL (ref 0.30–0.70)
Heparin Unfractionated: 0.72 IU/mL — ABNORMAL HIGH (ref 0.30–0.70)
Heparin Unfractionated: 0.28 IU/mL — ABNORMAL LOW (ref 0.30–0.70)
Heparin Unfractionated: 0.44 IU/mL (ref 0.30–0.70)
Heparin Unfractionated: 0.59 IU/mL (ref 0.30–0.70)

## 2010-08-12 LAB — TSH: TSH: 1.988 u[IU]/mL (ref 0.350–4.500)

## 2010-08-12 LAB — POCT I-STAT, CHEM 8
Calcium, Ion: 1.19 mmol/L (ref 1.12–1.32)
Chloride: 103 mEq/L (ref 96–112)
Glucose, Bld: 89 mg/dL (ref 70–99)
HCT: 44 % (ref 39.0–52.0)
TCO2: 25 mmol/L (ref 0–100)

## 2012-01-18 ENCOUNTER — Emergency Department (HOSPITAL_COMMUNITY): Payer: Medicaid Other

## 2012-01-18 ENCOUNTER — Encounter (HOSPITAL_COMMUNITY): Payer: Self-pay | Admitting: *Deleted

## 2012-01-18 ENCOUNTER — Emergency Department (HOSPITAL_COMMUNITY)
Admission: EM | Admit: 2012-01-18 | Discharge: 2012-01-18 | Disposition: A | Discharge status: Home / Self Care | Payer: Medicaid Other | Attending: Emergency Medicine | Admitting: Emergency Medicine

## 2012-01-18 DIAGNOSIS — S62609B Fracture of unspecified phalanx of unspecified finger, initial encounter for open fracture: Secondary | ICD-10-CM

## 2012-01-18 DIAGNOSIS — W268XXA Contact with other sharp object(s), not elsewhere classified, initial encounter: Secondary | ICD-10-CM | POA: Insufficient documentation

## 2012-01-18 DIAGNOSIS — Z950 Presence of cardiac pacemaker: Secondary | ICD-10-CM | POA: Insufficient documentation

## 2012-01-18 DIAGNOSIS — Z7901 Long term (current) use of anticoagulants: Secondary | ICD-10-CM | POA: Insufficient documentation

## 2012-01-18 DIAGNOSIS — Z23 Encounter for immunization: Secondary | ICD-10-CM | POA: Insufficient documentation

## 2012-01-18 DIAGNOSIS — Z7982 Long term (current) use of aspirin: Secondary | ICD-10-CM | POA: Insufficient documentation

## 2012-01-18 IMAGING — CR DG FINGER THUMB 2+V*L*
2 series · 2 of 2 positions shown · non-contrast
Comparison: None.

CLINICAL DATA: Thumb laceration.

LEFT THUMB 2+V

[view not recorded (1 of 2)]
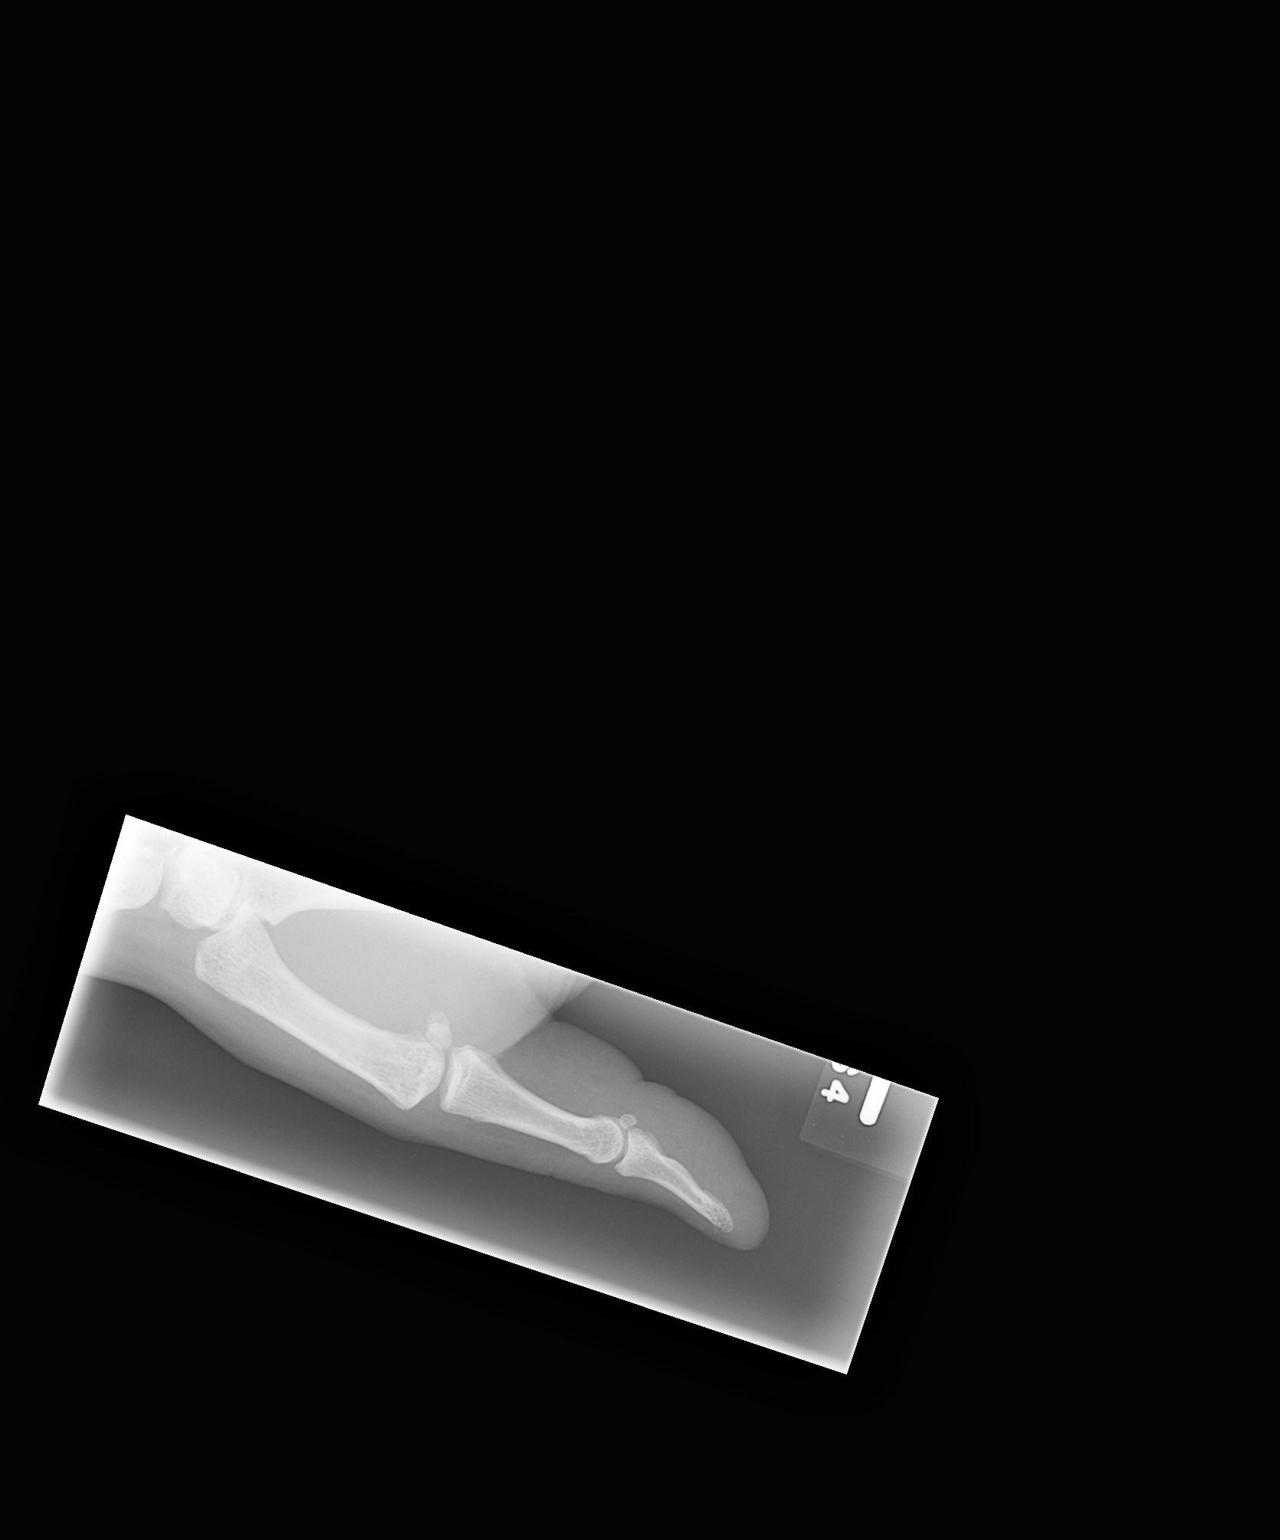

[view not recorded (2 of 2)]
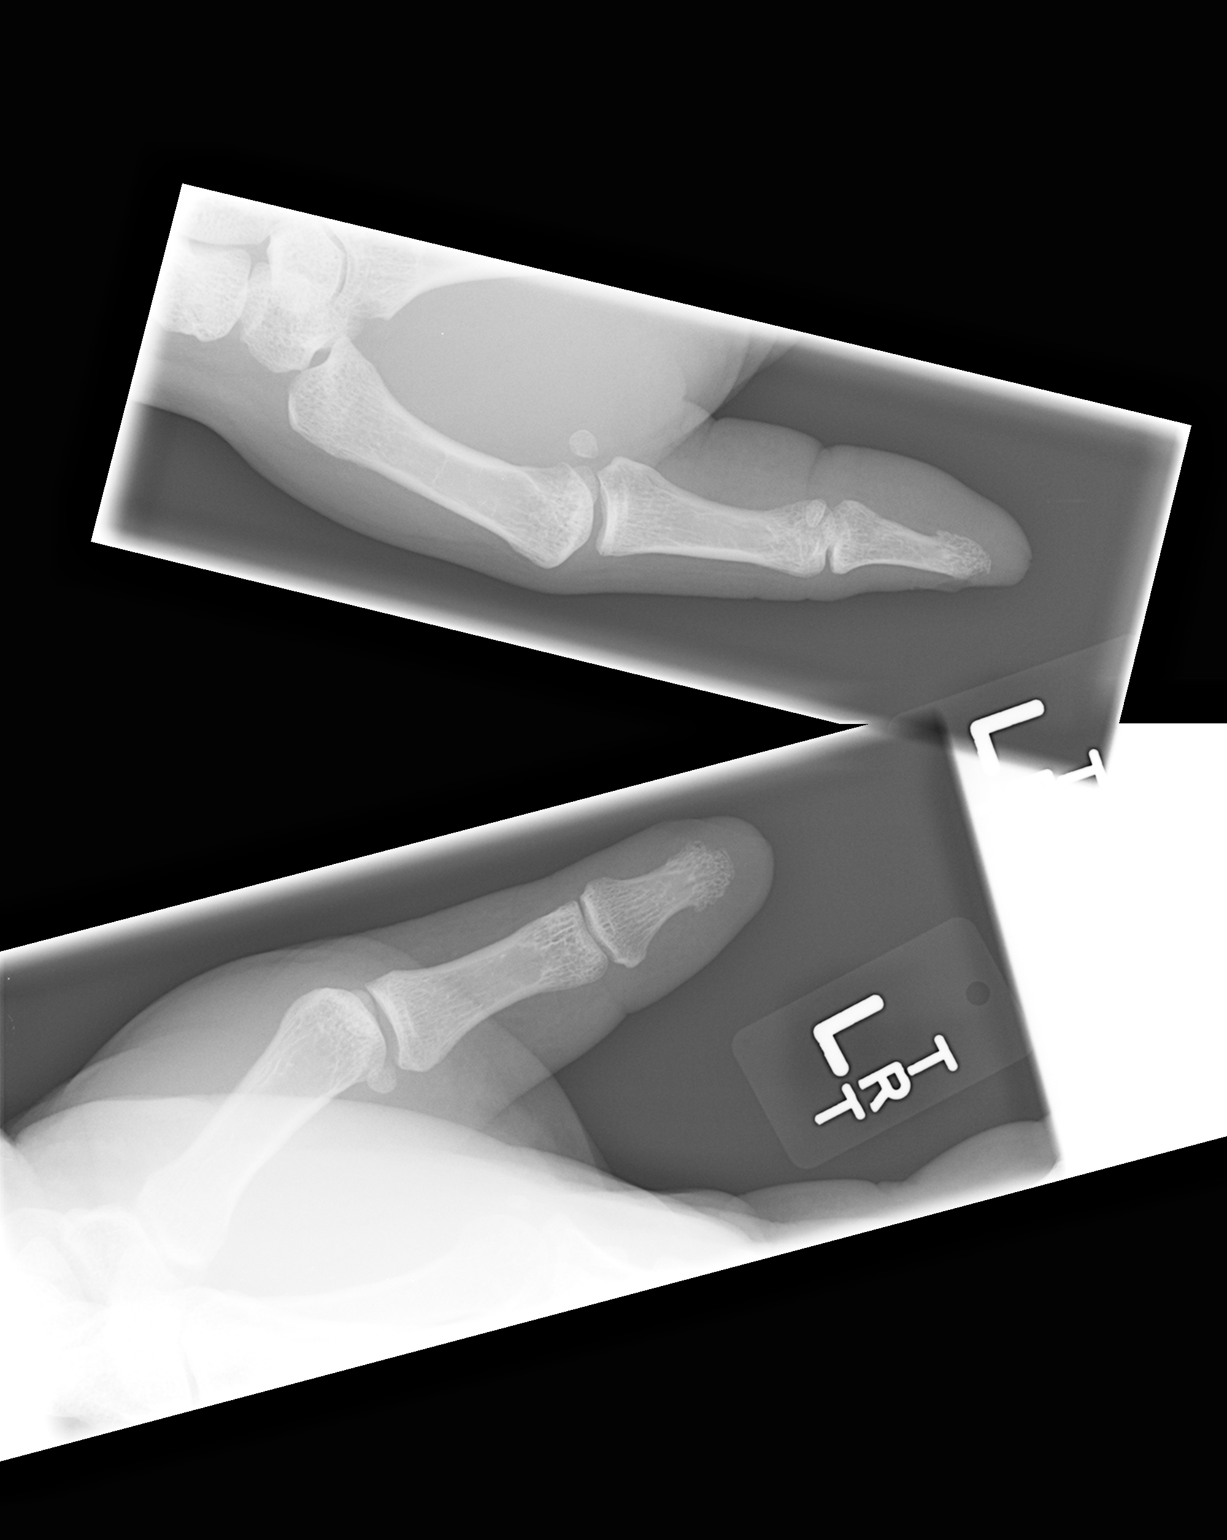

[2 of 2 positions shown; findings below may reference images not displayed]

FINDINGS: Probable small avulsed fragment off the dorsal aspect of
the tip of the left thumb distal phalanx, seen only on the lateral
view.  No additional acute bony abnormality.  Joint spaces are
maintained.  Normal bone mineralization.  No radiopaque foreign
bodies.
IMPRESSION: Probable small avulsed fragment off the dorsal tip of the left
thumb distal phalanx.

## 2012-01-18 MED ORDER — TETANUS-DIPHTHERIA TOXOIDS TD 5-2 LFU IM INJ
INJECTION | INTRAMUSCULAR | Status: AC
  Administered 2012-01-18: 0.5 mL via INTRAMUSCULAR
  Filled 2012-01-18: qty 0.5

## 2012-01-18 MED ORDER — TETANUS-DIPHTH-ACELL PERTUSSIS 5-2.5-18.5 LF-MCG/0.5 IM SUSP: 0.5000 mL | Freq: Once | INTRAMUSCULAR | Status: DC

## 2012-01-18 MED ORDER — BACITRACIN ZINC 500 UNIT/GM EX OINT
1.0000 "application " | TOPICAL_OINTMENT | Freq: Two times a day (BID) | CUTANEOUS | Status: DC
  Administered 2012-01-18: 1 via TOPICAL
  Filled 2012-01-18: qty 0.9

## 2012-01-18 MED ORDER — BUPIVACAINE HCL (PF) 0.5 % IJ SOLN
INTRAMUSCULAR | Status: AC
  Administered 2012-01-18: 11:00:00
  Filled 2012-01-18: qty 30

## 2012-01-18 MED ORDER — HYDROCODONE-ACETAMINOPHEN 5-325 MG PO TABS: 1.0000 | ORAL_TABLET | Freq: Four times a day (QID) | ORAL | Status: AC | PRN

## 2012-01-18 MED ORDER — CEPHALEXIN 500 MG PO CAPS: 500.0000 mg | ORAL_CAPSULE | Freq: Four times a day (QID) | ORAL | Status: AC

## 2012-01-18 NOTE — ED Provider Notes (Signed)
History     CSN: [id number]  Arrival date & time 01/18/12  0900   First MD Initiated Contact with Patient 01/18/12 [phone]      Chief Complaint  Patient presents with  . Laceration    (Consider location/radiation/quality/duration/timing/severity/associated sxs/prior treatment) HPI Comments: "messing around with my crossbow" and when he pulled the trigger the string struck his L thumb.  DT not UTD.  R hand dominant.  Patient is a 31 y.o. male presenting with skin laceration. The history is provided by the patient. No language interpreter was used.  Laceration  The incident occurred 1 to 2 hours ago. Pain location: L thumb. The laceration is 2 cm in size. The pain is moderate. The pain has been constant since onset. He reports no foreign bodies present. His tetanus status is out of date.    Past Medical History  Diagnosis Date  . CHF (congestive heart failure)   . Pacemaker     Past Surgical History  Procedure Date  . Pacemaker placement     No family history on file.  History  Substance Use Topics  . Smoking status: Never Smoker   . Smokeless tobacco: Not on file  . Alcohol Use: No      Review of Systems  Skin: Positive for wound.  Hematological: Bruises/bleeds easily.  All other systems reviewed and are negative.    Allergies  Review of patient's allergies indicates no known allergies.  Home Medications   Current Outpatient Rx  Name Route Sig Dispense Refill  . ASPIRIN EC 81 MG PO TBEC Oral Take 81 mg by mouth daily.    Marland Kitchen CARVEDILOL 25 MG PO TABS Oral Take 25 mg by mouth 2 (two) times daily with a meal.    . FUROSEMIDE 40 MG PO TABS Oral Take 40 mg by mouth daily as needed. Swelling    . LISINOPRIL 20 MG PO TABS Oral Take 20 mg by mouth 2 (two) times daily.    Marland Kitchen SPIRONOLACTONE 25 MG PO TABS Oral Take 25 mg by mouth daily.    . WARFARIN SODIUM 5 MG PO TABS Oral Take 5 mg by mouth daily.    . CEPHALEXIN 500 MG PO CAPS Oral Take 1 capsule (500 mg total) by  mouth 4 (four) times daily. 40 capsule 0  . HYDROCODONE-ACETAMINOPHEN 5-325 MG PO TABS Oral Take 1 tablet by mouth every 6 (six) hours as needed for pain. 20 tablet 0    BP 105/62  Pulse 57  Temp 98.6 F (37 C)  Resp 18  Ht 6\' 5"  (1.956 m)  Wt 360 lb (163.295 kg)  BMI 42.69 kg/m2  SpO2 100%  Physical Exam  Nursing note and vitals reviewed. Constitutional: He is oriented to person, place, and time. He appears well-developed and well-nourished.  HENT:  Head: Normocephalic and atraumatic.  Eyes: EOM are normal.  Neck: Normal range of motion.  Cardiovascular: Normal rate, regular rhythm, normal heart sounds and intact distal pulses.   Pulmonary/Chest: Effort normal and breath sounds normal. No respiratory distress.  Abdominal: Soft. He exhibits no distension. There is no tenderness.  Musculoskeletal:       Left hand: He exhibits decreased range of motion, tenderness and laceration. normal sensation noted. Normal strength noted.       Distal half of L thumb nail avulsed.  Continuously oozing blood.   Digital block performed with 8 ml of sensorcaine 0.5% without epi.  Laceration evident transversely across nail bed.  Wound edges are well-approximated.  Does not require suturing.  Neurological: He is alert and oriented to person, place, and time.  Skin: Skin is warm and dry.  Psychiatric: He has a normal mood and affect. Judgment normal.    ED Course  Procedures (including critical care time)  Labs Reviewed - No data to display Dg Finger Thumb Left  01/18/2012  *RADIOLOGY REPORT*  Clinical Data: Thumb laceration.  LEFT THUMB 2+V  Comparison: None.  Findings: Probable small avulsed fragment off the dorsal aspect of the tip of the left thumb distal phalanx, seen only on the lateral view.  No additional acute bony abnormality.  Joint spaces are maintained.  Normal bone mineralization.  No radiopaque foreign bodies.  IMPRESSION: Probable small avulsed fragment off the dorsal tip of the  left thumb distal phalanx.   Original Report Authenticated By: Cyndie Chime, M.D.      1. Open fracture of finger       MDM  rx-keflex  rx-hydrocodone Ice, elevation F/u with dr. Romeo Apple prn        Evalina Field, PA 01/18/12 1147

## 2012-01-18 NOTE — ED Notes (Addendum)
C/o laceration to left thumb, pt states that he was shooting his cross bow this am and his thumb got caught, bleeding noted in triage, pressure dressing applied, laceration across nail area of left thumb pt states that he takes blood thinners

## 2012-01-21 NOTE — ED Provider Notes (Signed)
Medical screening examination/treatment/procedure(s) were conducted as a shared visit with non-physician practitioner(s) and myself.  I personally evaluated the patient during the encounter.  Open fracture of distal aspect of left thumb. Wound is out of prior suturing. Good wound care. Refer to orthopedics. Tetanus up to date  Donnetta Hutching, MD 01/21/12 [phone]

## 2012-07-18 ENCOUNTER — Ambulatory Visit: Payer: Self-pay | Admitting: Cardiovascular Disease

## 2012-07-27 ENCOUNTER — Ambulatory Visit (INDEPENDENT_AMBULATORY_CARE_PROVIDER_SITE_OTHER): Payer: Medicaid Other | Admitting: Physician Assistant

## 2012-07-27 ENCOUNTER — Encounter: Payer: Self-pay | Admitting: Physician Assistant

## 2012-07-27 ENCOUNTER — Telehealth: Payer: Self-pay | Admitting: Physician Assistant

## 2012-07-27 VITALS — BP 126/70 | HR 68 | Temp 98.4°F | Ht 74.0 in | Wt >= 6400 oz

## 2012-07-27 DIAGNOSIS — R21 Rash and other nonspecific skin eruption: Secondary | ICD-10-CM | POA: Insufficient documentation

## 2012-07-27 LAB — COMPREHENSIVE METABOLIC PANEL
Alkaline Phosphatase: 47 U/L (ref 39–117)
BUN: 10 mg/dL (ref 6–23)
Creat: 0.78 mg/dL (ref 0.50–1.35)
Glucose, Bld: 88 mg/dL (ref 70–99)
Sodium: 137 mEq/L (ref 135–145)
Total Bilirubin: 0.8 mg/dL (ref 0.3–1.2)

## 2012-07-27 LAB — POCT CBC
HCT, POC: 40.2 % — AB (ref 43.5–53.7)
Lymph, poc: 1.9 (ref 0.6–3.4)
MCH, POC: 28.2 pg (ref 27–31.2)
MCHC: 33.4 g/dL (ref 31.8–35.4)
MCV: 84.4 fL (ref 80–97)
POC Granulocyte: 2.1 (ref 2–6.9)
POC LYMPH PERCENT: 43.2 %L (ref 10–50)
RDW, POC: 14.6 %
WBC: 4.4 10*3/uL — AB (ref 4.6–10.2)

## 2012-07-27 LAB — SEDIMENTATION RATE: Sed Rate: 18 mm/hr — ABNORMAL HIGH (ref 0–16)

## 2012-07-27 MED ORDER — BETAMETHASONE DIPROPIONATE 0.05 % EX CREA: TOPICAL_CREAM | Freq: Two times a day (BID) | CUTANEOUS | Status: DC

## 2012-07-27 NOTE — Telephone Encounter (Signed)
Has a rash. WTBS

## 2012-07-27 NOTE — Progress Notes (Signed)
  Subjective:    Patient ID: Danny Hinton, male    DOB: 1980/12/16, 32 y.o.   MRN: [id number]  HPI rash since age 70; used to scrap with a knife, has used cortisone 10;     Review of Systems     Objective:   Physical Exam  Brown/black velvety patches on left tibia (6x8 cm), right olecrenon process, knuckles and along the throat        Assessment & Plan:  Dermatitis r/o acanthosis nigrans  Orders Placed This Encounter  Procedures  . Comprehensive metabolic panel  . ANA  . Sedimentation Rate  . Ambulatory referral to Dermatology    Referral Priority:  Routine    Referral Type:  Consultation    Referral Reason:  Specialty Services Required    Requested Specialty:  Dermatology    Number of Visits Requested:  1  . POCT CBC   Meds ordered this encounter  Medications  . betamethasone dipropionate (DIPROLENE) 0.05 % cream    Sig: Apply topically 2 (two) times daily.    Dispense:  30 g    Refill:  0    Order Specific Question:  Supervising Provider    Answer:  Ernestina Penna [phone]

## 2012-07-28 NOTE — Progress Notes (Signed)
Quick Note:  Labs were within normal limits ______ 

## 2012-08-02 ENCOUNTER — Ambulatory Visit: Payer: Self-pay | Admitting: Physician Assistant

## 2012-08-02 ENCOUNTER — Telehealth: Payer: Self-pay | Admitting: *Deleted

## 2012-08-02 DIAGNOSIS — L309 Dermatitis, unspecified: Secondary | ICD-10-CM

## 2012-08-02 MED ORDER — BETAMETHASONE VALERATE 0.1 % EX OINT: TOPICAL_OINTMENT | Freq: Two times a day (BID) | CUTANEOUS | Status: DC

## 2012-08-02 NOTE — Telephone Encounter (Signed)
Try another med

## 2012-08-02 NOTE — Telephone Encounter (Signed)
Pharmacist stated betamethasone 0.1% ointment was pretty comparable.  Prescription changed and one refill authorized.

## 2012-08-02 NOTE — Telephone Encounter (Signed)
Insurance won't cover Diprolene and they won't share with the pharmacy what is covered.  Would you like to try another medication or go through the prior authorization process?

## 2012-08-02 NOTE — Telephone Encounter (Signed)
Insurance wouldn't cover betamethasone either so script switched to clobetasol.

## 2012-08-03 MED ORDER — CLOBETASOL PROPIONATE 0.05 % EX CREA: TOPICAL_CREAM | Freq: Two times a day (BID) | CUTANEOUS | Status: DC

## 2012-08-03 NOTE — Addendum Note (Signed)
Addended by: Carren Rang F on: 08/03/2012 03:19 PM   Modules accepted: Orders, Medications

## 2012-08-05 LAB — ICD DEVICE OBSERVATION

## 2012-08-11 ENCOUNTER — Encounter: Payer: Self-pay | Admitting: *Deleted

## 2012-08-11 NOTE — Telephone Encounter (Signed)
This encounter was created in error - please disregard.

## 2012-08-23 DIAGNOSIS — L259 Unspecified contact dermatitis, unspecified cause: Secondary | ICD-10-CM | POA: Diagnosis not present

## 2012-09-06 ENCOUNTER — Telehealth: Payer: Self-pay | Admitting: Nurse Practitioner

## 2012-09-06 ENCOUNTER — Ambulatory Visit (INDEPENDENT_AMBULATORY_CARE_PROVIDER_SITE_OTHER): Payer: Medicare Other | Admitting: General Practice

## 2012-09-06 ENCOUNTER — Encounter: Payer: Self-pay | Admitting: General Practice

## 2012-09-06 VITALS — BP 113/75 | HR 73 | Temp 99.0°F | Ht 75.0 in | Wt 395.5 lb

## 2012-09-06 DIAGNOSIS — J309 Allergic rhinitis, unspecified: Secondary | ICD-10-CM

## 2012-09-06 NOTE — Telephone Encounter (Signed)
appt made for today 

## 2012-09-06 NOTE — Patient Instructions (Signed)
Allergic Rhinitis  Allergic rhinitis is when the mucous membranes in the nose respond to allergens. Allergens are particles in the air that cause your body to have an allergic reaction. This causes you to release allergic antibodies. Through a chain of events, these eventually cause you to release histamine into the blood stream (hence the use of antihistamines). Although meant to be protective to the body, it is this release that causes your discomfort, such as frequent sneezing, congestion and an itchy runny nose.    CAUSES    The pollen allergens may come from grasses, trees, and weeds. This is seasonal allergic rhinitis, or "hay fever." Other allergens cause year-round allergic rhinitis (perennial allergic rhinitis) such as house dust mite allergen, pet dander and mold spores.    SYMPTOMS     Nasal stuffiness (congestion).   Runny, itchy nose with sneezing and tearing of the eyes.   There is often an itching of the mouth, eyes and ears.  It cannot be cured, but it can be controlled with medications.  DIAGNOSIS    If you are unable to determine the offending allergen, skin or blood testing may find it.  TREATMENT     Avoid the allergen.   Medications and allergy shots (immunotherapy) can help.   Hay fever may often be treated with antihistamines in pill or nasal spray forms. Antihistamines block the effects of histamine. There are over-the-counter medicines that may help with nasal congestion and swelling around the eyes. Check with your caregiver before taking or giving this medicine.  If the treatment above does not work, there are many new medications your caregiver can prescribe. Stronger medications may be used if initial measures are ineffective. Desensitizing injections can be used if medications and avoidance fails. Desensitization is when a patient is given ongoing shots until the body becomes less sensitive to the allergen. Make sure you follow up with your caregiver if problems continue.   SEEK MEDICAL CARE IF:     You develop fever (more than 100.5 F (38.1 C).   You develop a cough that does not stop easily (persistent).   You have shortness of breath.   You start wheezing.   Symptoms interfere with normal daily activities.  Document Released: 01/19/2001 Document Revised: 07/19/2011 Document Reviewed: 07/31/2008  Exit[employer] Patient Information 2013 [employer].

## 2012-09-06 NOTE — Progress Notes (Signed)
  Subjective:    Patient ID: Danny Hinton, male    DOB: October 21, 1980, 32 y.o.   MRN: [id number]  HPI Patient presents today with sneezing, watery eyes, red at times, and runny nose. Reports that he was out of claritin and restarted taking on Friday. Reports symptoms are controlled when taking claritin.    Review of Systems  Constitutional: Negative for fever and chills.  Respiratory: Negative for cough.   Cardiovascular: Negative for chest pain.  Skin: Negative.        Objective:   Physical Exam  Constitutional: He is oriented to person, place, and time. He appears well-developed and well-nourished.  HENT:  Nose: Right sinus exhibits no maxillary sinus tenderness and no frontal sinus tenderness. Left sinus exhibits no maxillary sinus tenderness and no frontal sinus tenderness.  Cardiovascular: Normal rate, regular rhythm and normal heart sounds.   No murmur heard. Pulmonary/Chest: Effort normal and breath sounds normal.  Neurological: He is alert and oriented to person, place, and time.  Skin: Skin is warm and dry.          Assessment & Plan:  Continue claritin as directed Avoid allergens RTO if symptoms worsen Patient verbalized understanding Raymon Mutton, FNP-C

## 2012-09-20 NOTE — Telephone Encounter (Signed)
Pt was seen 07/27/2012 by Helene Kelp

## 2012-10-03 ENCOUNTER — Other Ambulatory Visit (HOSPITAL_COMMUNITY): Payer: Self-pay | Admitting: Cardiovascular Disease

## 2012-10-04 ENCOUNTER — Other Ambulatory Visit: Payer: Self-pay | Admitting: *Deleted

## 2012-10-04 MED ORDER — SPIRONOLACTONE 25 MG PO TABS: 25.0000 mg | ORAL_TABLET | Freq: Every day | ORAL | Status: DC

## 2012-10-04 MED ORDER — WARFARIN SODIUM 5 MG PO TABS: 5.0000 mg | ORAL_TABLET | Freq: Every day | ORAL | Status: DC

## 2012-10-04 NOTE — Telephone Encounter (Signed)
Coumadin and spironolactone refilled w/5 refills.

## 2012-10-16 ENCOUNTER — Encounter (HOSPITAL_COMMUNITY): Payer: Self-pay | Admitting: Pharmacy Technician

## 2012-10-16 NOTE — Pre-Procedure Instructions (Signed)
Danny Hinton  10/16/2012   Your procedure is scheduled on:  Monday October 23, 2012.  Report to [facility] at 7:00 AM.  Call this number if you have problems the morning of surgery: [phone]   Remember:   Do not eat food or drink liquids after midnight.   Take these medicines the morning of surgery with A SIP OF WATER: Carvedilol (Coreg)   Do not wear jewelry  Do not wear lotions, powders, or colognes.  Men may shave face and neck.  Do not bring valuables to the hospital.  [facility] is not responsible  for any belongings or valuables.  Contacts, dentures or bridgework may not be worn into surgery.  Leave suitcase in the car. After surgery it may be brought to your room.  For patients admitted to the hospital, checkout time is 11:00 AM the day of discharge.   Patients discharged the day of surgery will not be allowed to drive home.  Name and phone number of your driver: Family/Friend  Special Instructions: Shower using CHG 2 nights before surgery and the night before surgery.  If you shower the day of surgery use CHG.  Use special wash - you have one bottle of CHG for all showers.  You should use approximately 1/3 of the bottle for each shower.   Please read over the following fact sheets that you were given: Pain Booklet, Coughing and Deep Breathing and Surgical Site Infection Prevention

## 2012-10-17 ENCOUNTER — Ambulatory Visit (HOSPITAL_COMMUNITY)
Admission: RE | Admit: 2012-10-17 | Discharge: 2012-10-17 | Disposition: A | Discharge status: Home / Self Care | Payer: Medicare Other | Source: Ambulatory Visit | Attending: Anesthesiology | Admitting: Anesthesiology

## 2012-10-17 ENCOUNTER — Encounter (HOSPITAL_COMMUNITY): Payer: Self-pay

## 2012-10-17 ENCOUNTER — Encounter (HOSPITAL_COMMUNITY)
Admission: RE | Admit: 2012-10-17 | Discharge: 2012-10-17 | Disposition: A | Discharge status: Home / Self Care | Payer: Medicare Other | Source: Ambulatory Visit | Attending: Oral Surgery | Admitting: Oral Surgery

## 2012-10-17 DIAGNOSIS — Z01810 Encounter for preprocedural cardiovascular examination: Secondary | ICD-10-CM | POA: Diagnosis not present

## 2012-10-17 DIAGNOSIS — Z01818 Encounter for other preprocedural examination: Secondary | ICD-10-CM | POA: Insufficient documentation

## 2012-10-17 DIAGNOSIS — Z01812 Encounter for preprocedural laboratory examination: Secondary | ICD-10-CM | POA: Diagnosis not present

## 2012-10-17 DIAGNOSIS — I10 Essential (primary) hypertension: Secondary | ICD-10-CM | POA: Diagnosis not present

## 2012-10-17 HISTORY — DX: Essential (primary) hypertension: I10

## 2012-10-17 HISTORY — DX: Paroxysmal atrial fibrillation: I48.0

## 2012-10-17 HISTORY — DX: Dermatitis, unspecified: L30.9

## 2012-10-17 LAB — CBC
HCT: 38.9 % — ABNORMAL LOW (ref 39.0–52.0)
MCV: 85.7 fL (ref 78.0–100.0)
RBC: 4.54 MIL/uL (ref 4.22–5.81)
WBC: 4.7 10*3/uL (ref 4.0–10.5)

## 2012-10-17 LAB — BASIC METABOLIC PANEL
CO2: 25 mEq/L (ref 19–32)
Chloride: 101 mEq/L (ref 96–112)
Sodium: 135 mEq/L (ref 135–145)

## 2012-10-17 LAB — APTT: aPTT: 46 seconds — ABNORMAL HIGH (ref 24–37)

## 2012-10-17 IMAGING — CR DG CHEST 2V
2 series · 2 of 2 positions shown · non-contrast
Comparison: February 17, 2010

CLINICAL DATA: Hypertension; history of cardiac arrhythmia

CHEST - 2 VIEW

[view not recorded (1 of 2)]
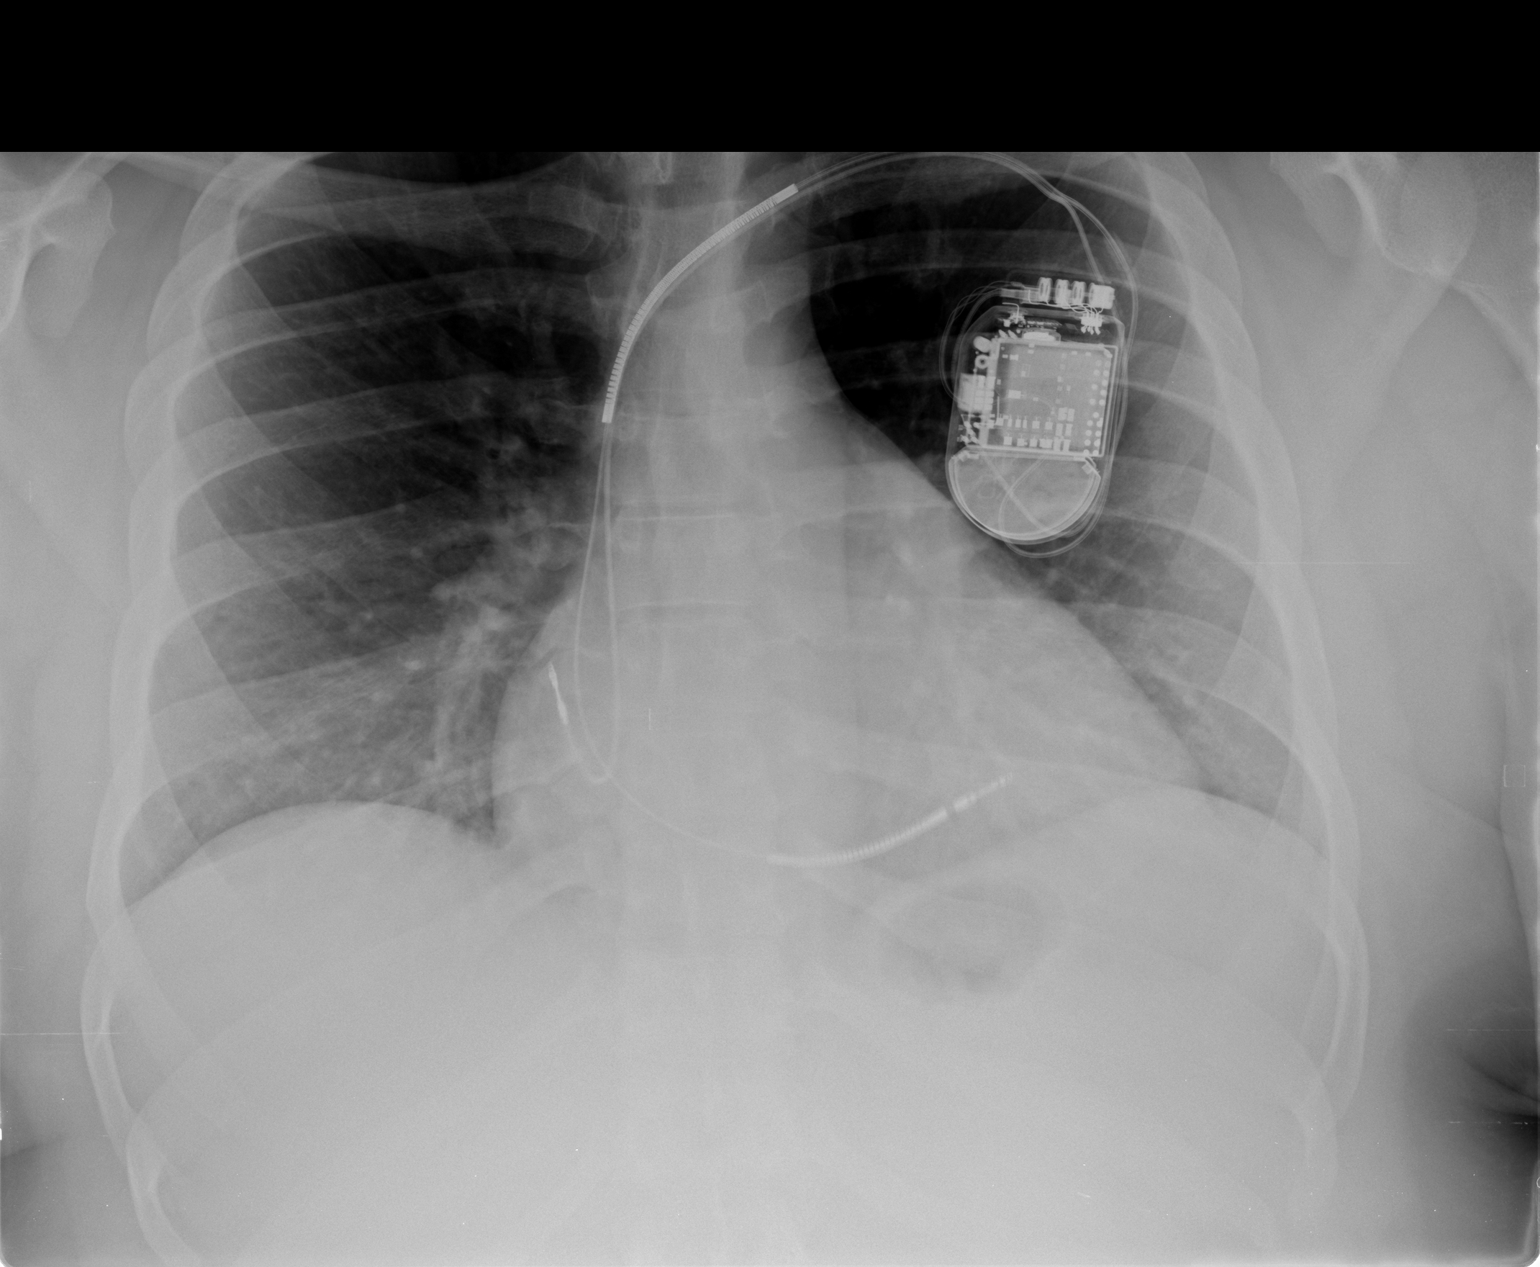

[view not recorded (2 of 2)]
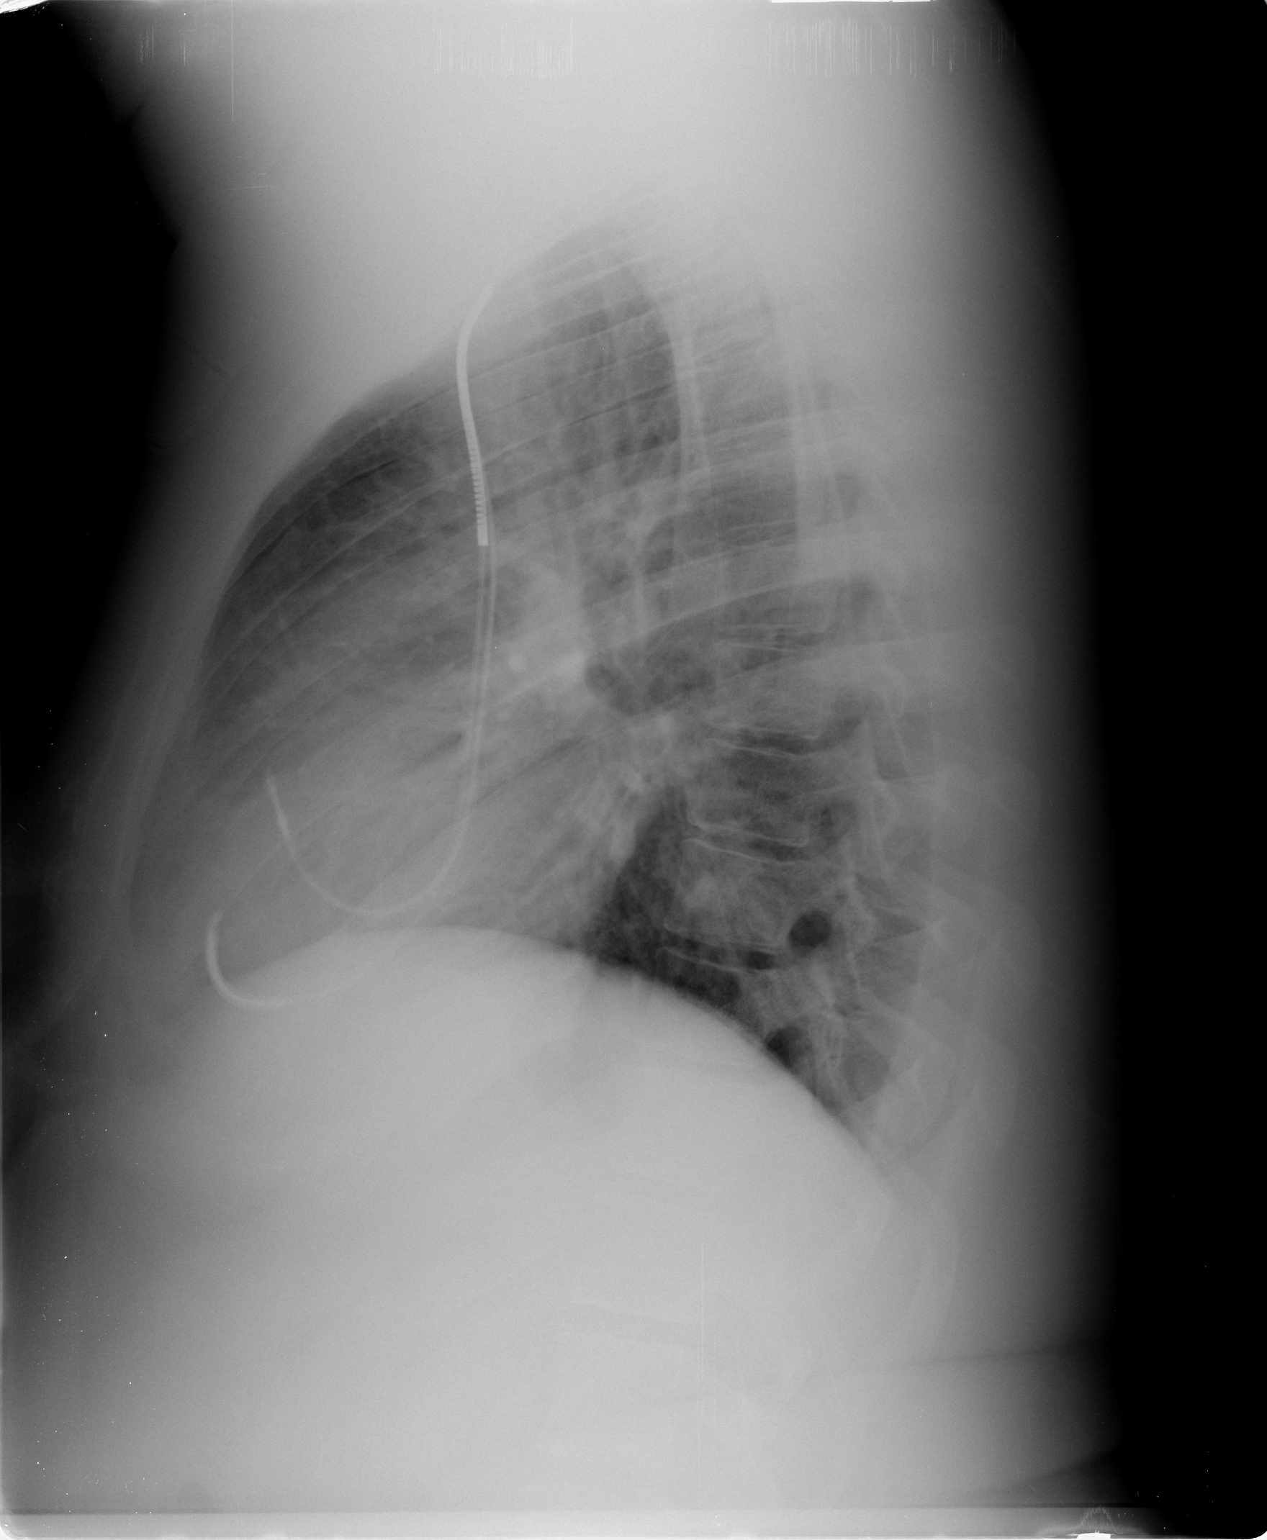

[2 of 2 positions shown; findings below may reference images not displayed]

FINDINGS: Pacemaker leads were attached to the right atrium and
right ventricle.  Heart is mildly enlarged with normal pulmonary
vascularity.  No adenopathy.  Lungs clear.  No bony lesions.
IMPRESSION: Pacemaker leads as described.  No pneumothorax.  Lungs
clear.  Heart mildly enlarged but stable.

## 2012-10-17 NOTE — Progress Notes (Signed)
Patient informed Nurse that he will be stopping coumadin tomorrow 10/18/12 per physicians orders. Nurse informed patient that if the PT/INR was abnormal that it may be repeated DOS. Patient verbalized understanding.

## 2012-10-17 NOTE — Progress Notes (Signed)
Patient denied having a stress test or cardiac cath, but informed Nurse that he had a sleep study and it was negative. Pacemaker/ICD ([employer]) form faxed to Dr. Royann Shivers at [place].

## 2012-10-18 ENCOUNTER — Encounter (HOSPITAL_COMMUNITY): Payer: Self-pay

## 2012-10-18 NOTE — Progress Notes (Signed)
Anesthesia chart review:  Patient is a 32 year old male scheduled for extraction of five teeth by Dr. Barbette Merino on 10/23/12.  History includes morbid obesity, non-smoker, HTN, non-ischemic cardiomyopathy, CHF, [employer]. [employer] AICD 02/17/10, PAF. He had an unremarkable sleep study in 2010.  Cardiologist is Dr. Royann Shivers who cleared patient for this procedure with low to moderate CV risk.  He was told he could hold his Coumadin 5 days preoperatively.    EKG on 10/17/12 showed NSR, minimal voltage criteria for LVH.  Echo on 01/08/10 [facility]) showed moderately dilated left ventricle, normal left ventricular wall thickness, left ventricular systolic function is severely reduced with EF approximately 30%. Doppler flow pattern is suggestive of impaired LV relaxation. Severe global hypokinesis of the left ventricle. No regional wall motion abnormalities noted. The right ventricle is mildly dilated. Right ventricular systolic function is mildly reduced. Trace mitral regurgitation. Mild tricuspid regurgitation. RVSP is normal. Mild pulmonic valvular regurgitation. Moderate aortic root dilation.  CXR on 10/17/12 showed: Pacemaker leads as described. No pneumothorax. Lungs clear. Heart mildly enlarged but stable.  Preoperative labs noted.  He is stopping Coumdin on 10/18/12 in anticipation for surgery. Plan to recheck PT/PTT on arrival.    If no acute changes in his CV/CHF status and follow-up labs are acceptable then I would anticipate that he could proceed as planned.  Velna Ochs [facility]Anesthesiology Phone [phone] 10/18/2012 1:09 PM

## 2012-10-19 NOTE — Progress Notes (Signed)
Re-faxed implanted cardiac device sheet to [facility]

## 2012-10-19 NOTE — H&P (Signed)
HISTORY AND PHYSICAL  Danny Hinton is a 32 y.o. male patient with CC: Dental pain  No diagnosis found.  Past Medical History  Diagnosis Date  . CHF (congestive heart failure)   . Hypertension   . Pacemaker     ICD; St Jude  . Eczema   . Non-ischemic cardiomyopathy     Dr. Royann Hinton, [facility]  . PAF (paroxysmal atrial fibrillation)     No current facility-administered medications for this encounter.   Current Outpatient Prescriptions  Medication Sig Dispense Refill  . Ascorbic Acid (VITAMIN C PO) Take 1 tablet by mouth daily.      Marland Kitchen aspirin EC 81 MG tablet Take 81 mg by mouth daily.      . carvedilol (COREG) 25 MG tablet Take 25 mg by mouth 2 (two) times daily with a meal.      . clobetasol cream (TEMOVATE) 0.05 % Apply 1 application topically 2 (two) times daily as needed (for eczema).      . furosemide (LASIX) 40 MG tablet Take 40 mg by mouth daily as needed (for swelling). Swelling      . lisinopril (PRINIVIL,ZESTRIL) 20 MG tablet Take 20 mg by mouth 2 (two) times daily.      . Omega-3 Fatty Acids (FISH OIL) 1000 MG CAPS Take 1,000 mg by mouth 2 (two) times daily.      Marland Kitchen spironolactone (ALDACTONE) 25 MG tablet Take 1 tablet (25 mg total) by mouth daily.  30 tablet  5  . warfarin (COUMADIN) 5 MG tablet Take 1 tablet (5 mg total) by mouth daily.  30 tablet  5   No Known Allergies Active Problems:   * No active hospital problems. *  Vitals: There were no vitals taken for this visit. Lab results:No results found for this or any previous visit (from the past 24 hour(s)). Radiology Results: Dg Chest 2 View  10/17/2012   *RADIOLOGY REPORT*  Clinical Data: Hypertension; history of cardiac arrhythmia  CHEST - 2 VIEW  Comparison:  February 17, 2010  Findings: Pacemaker leads were attached to the right atrium and right ventricle.  Heart is mildly enlarged with normal pulmonary vascularity.  No adenopathy.  Lungs clear.  No bony lesions.  IMPRESSION: Pacemaker leads as described.  No  pneumothorax.  Lungs clear.  Heart mildly enlarged but stable.   Original Report Authenticated By: Bretta Bang, M.D.   General appearance: alert, cooperative and morbidly obese Head: Normocephalic, without obvious abnormality, atraumatic Eyes: conjunctivae/corneas clear. PERRL, EOM's intact. Fundi benign. Ears: normal TM's and external ear canals both ears Nose: Nares normal. Septum midline. Mucosa normal. No drainage or sinus tenderness. Throat: lips, mucosa, and tongue normal; teeth and gums normal and Dental caries Teeth #'s 1, 16, 17, 31, 32 Neck: no adenopathy, supple, symmetrical, trachea midline and thyroid not enlarged, symmetric, no tenderness/mass/nodules Resp: clear to auscultation bilaterally Cardio: regular rate and rhythm, S1, S2 normal, no murmur, click, rub or gallop  Assessment: 32 BM Morbid obesity, HTN, CHF, Pacemaker, Non-ischemic cardiomyopathy with non-restorable teeth #'s 1, 16, 17, 31, 32.  Plan: Dental extractions teeth 1, 16, 17, 31, 32. General anesthesia. Day surgery   Danny Hinton 10/19/2012

## 2012-10-20 ENCOUNTER — Telehealth: Payer: Self-pay | Admitting: Cardiovascular Disease

## 2012-10-20 NOTE — Telephone Encounter (Signed)
Scheduled for surgery on Monday-still waiting for ICD form to come back-Please fax to-[phone]-Need this asap!i

## 2012-10-20 NOTE — Telephone Encounter (Signed)
Per S. Lelon Perla, CMA, form faxed yesterday.  Call received from [facility] and stated the received the fax sent yesterday.  Disregard fax received.

## 2012-10-20 NOTE — Telephone Encounter (Signed)
Returned call.  Marylene Land stated they have been faxing form since the 10th and have not received a response.  Informed form not received.  Will refax.  RN rechecked Dr. Erin Hearing inbox and found form.  Will give to B. Lassiter, CMA for Dr. Royann Shivers to review today.

## 2012-10-20 NOTE — Progress Notes (Addendum)
Nurse instructed patient to arrive at short stay at 0530. Patient verbalized understanding.

## 2012-10-22 MED ORDER — DEXTROSE 5 % IV SOLN
3.0000 g | INTRAVENOUS | Status: AC
  Administered 2012-10-23: 3 g via INTRAVENOUS
  Filled 2012-10-22: qty 3000

## 2012-10-23 ENCOUNTER — Encounter (HOSPITAL_COMMUNITY)
Admission: RE | Disposition: A | Discharge status: Home / Self Care | Payer: Self-pay | Source: Ambulatory Visit | Attending: Oral Surgery

## 2012-10-23 ENCOUNTER — Encounter (HOSPITAL_COMMUNITY): Payer: Self-pay | Admitting: *Deleted

## 2012-10-23 ENCOUNTER — Encounter (HOSPITAL_COMMUNITY): Payer: Self-pay | Admitting: Vascular Surgery

## 2012-10-23 ENCOUNTER — Ambulatory Visit (HOSPITAL_COMMUNITY)
Admission: RE | Admit: 2012-10-23 | Discharge: 2012-10-23 | Disposition: A | Discharge status: Home / Self Care | Payer: Medicaid Other | Source: Ambulatory Visit | Attending: Oral Surgery | Admitting: Oral Surgery

## 2012-10-23 ENCOUNTER — Ambulatory Visit (HOSPITAL_COMMUNITY): Payer: Medicaid Other | Admitting: Anesthesiology

## 2012-10-23 DIAGNOSIS — Z7982 Long term (current) use of aspirin: Secondary | ICD-10-CM | POA: Insufficient documentation

## 2012-10-23 DIAGNOSIS — R21 Rash and other nonspecific skin eruption: Secondary | ICD-10-CM

## 2012-10-23 DIAGNOSIS — L259 Unspecified contact dermatitis, unspecified cause: Secondary | ICD-10-CM | POA: Insufficient documentation

## 2012-10-23 DIAGNOSIS — I10 Essential (primary) hypertension: Secondary | ICD-10-CM | POA: Insufficient documentation

## 2012-10-23 DIAGNOSIS — K029 Dental caries, unspecified: Secondary | ICD-10-CM

## 2012-10-23 DIAGNOSIS — Z95810 Presence of automatic (implantable) cardiac defibrillator: Secondary | ICD-10-CM | POA: Insufficient documentation

## 2012-10-23 DIAGNOSIS — I428 Other cardiomyopathies: Secondary | ICD-10-CM | POA: Insufficient documentation

## 2012-10-23 DIAGNOSIS — Z79899 Other long term (current) drug therapy: Secondary | ICD-10-CM | POA: Insufficient documentation

## 2012-10-23 DIAGNOSIS — I471 Supraventricular tachycardia: Secondary | ICD-10-CM | POA: Diagnosis not present

## 2012-10-23 DIAGNOSIS — I509 Heart failure, unspecified: Secondary | ICD-10-CM | POA: Diagnosis not present

## 2012-10-23 DIAGNOSIS — Z7901 Long term (current) use of anticoagulants: Secondary | ICD-10-CM | POA: Insufficient documentation

## 2012-10-23 DIAGNOSIS — I4891 Unspecified atrial fibrillation: Secondary | ICD-10-CM | POA: Insufficient documentation

## 2012-10-23 HISTORY — DX: Presence of automatic (implantable) cardiac defibrillator: Z95.810

## 2012-10-23 HISTORY — PX: TOOTH EXTRACTION: SHX859

## 2012-10-23 LAB — APTT: aPTT: 34 seconds (ref 24–37)

## 2012-10-23 LAB — PROTIME-INR: INR: 1.29 (ref 0.00–1.49)

## 2012-10-23 SURGERY — DENTAL RESTORATION/EXTRACTIONS
Anesthesia: General | Site: Mouth | Wound class: Clean Contaminated

## 2012-10-23 MED ORDER — SODIUM CHLORIDE 0.9 % IR SOLN
Status: DC | PRN
  Administered 2012-10-23: 1

## 2012-10-23 MED ORDER — LACTATED RINGERS IV SOLN
INTRAVENOUS | Status: DC | PRN
  Administered 2012-10-23: 07:00:00 via INTRAVENOUS

## 2012-10-23 MED ORDER — MIDAZOLAM HCL 5 MG/5ML IJ SOLN
INTRAMUSCULAR | Status: DC | PRN
  Administered 2012-10-23: 1 mg via INTRAVENOUS

## 2012-10-23 MED ORDER — LIDOCAINE HCL (CARDIAC) 20 MG/ML IV SOLN
INTRAVENOUS | Status: DC | PRN
  Administered 2012-10-23: 80 mg via INTRAVENOUS

## 2012-10-23 MED ORDER — OXYMETAZOLINE HCL 0.05 % NA SOLN
NASAL | Status: AC
  Filled 2012-10-23: qty 15

## 2012-10-23 MED ORDER — LIDOCAINE-EPINEPHRINE 2 %-1:100000 IJ SOLN
INTRAMUSCULAR | Status: AC
  Filled 2012-10-23: qty 1

## 2012-10-23 MED ORDER — SUCCINYLCHOLINE CHLORIDE 20 MG/ML IJ SOLN
INTRAMUSCULAR | Status: DC | PRN
  Administered 2012-10-23: 140 mg via INTRAVENOUS

## 2012-10-23 MED ORDER — PROPOFOL 10 MG/ML IV BOLUS
INTRAVENOUS | Status: DC | PRN
  Administered 2012-10-23: 300 mg via INTRAVENOUS

## 2012-10-23 MED ORDER — WARFARIN SODIUM 5 MG PO TABS: ORAL_TABLET | ORAL | Status: DC

## 2012-10-23 MED ORDER — PHENYLEPHRINE HCL 10 MG/ML IJ SOLN
INTRAMUSCULAR | Status: DC | PRN
  Administered 2012-10-23: 80 ug via INTRAVENOUS

## 2012-10-23 MED ORDER — OXYCODONE-ACETAMINOPHEN 5-325 MG PO TABS: 1.0000 | ORAL_TABLET | ORAL | Status: DC | PRN

## 2012-10-23 MED ORDER — ONDANSETRON HCL 4 MG/2ML IJ SOLN
INTRAMUSCULAR | Status: DC | PRN
  Administered 2012-10-23: 4 mg via INTRAVENOUS

## 2012-10-23 MED ORDER — FENTANYL CITRATE 0.05 MG/ML IJ SOLN
INTRAMUSCULAR | Status: DC | PRN
  Administered 2012-10-23: 150 ug via INTRAVENOUS

## 2012-10-23 MED ORDER — LIDOCAINE-EPINEPHRINE 2 %-1:100000 IJ SOLN
INTRAMUSCULAR | Status: DC | PRN
  Administered 2012-10-23: 20 mL

## 2012-10-23 MED ORDER — HYDROMORPHONE HCL PF 1 MG/ML IJ SOLN: 0.2500 mg | INTRAMUSCULAR | Status: DC | PRN

## 2012-10-23 SURGICAL SUPPLY — 37 items
BLADE SURG 15 STRL LF DISP TIS (BLADE) IMPLANT
BLADE SURG 15 STRL SS (BLADE)
BUR CROSS CUT (BURR)
BUR CROSS CUT FISSURE 1.6 (BURR) ×2 IMPLANT
BUR EGG ELITE 4.0 (BURR) IMPLANT
BUR SRG MED 1.6XXCUT FSSR (BURR) IMPLANT
BUR SURG 4X8 MED (BURR) IMPLANT
BURR SRG MED 1.6XXCUT FSSR (BURR)
BURR SURG 4X8 MED (BURR)
CANISTER SUCTION 2500CC (MISCELLANEOUS) ×2 IMPLANT
CLOTH BEACON ORANGE TIMEOUT ST (SAFETY) ×2 IMPLANT
COVER SURGICAL LIGHT HANDLE (MISCELLANEOUS) ×2 IMPLANT
DECANTER SPIKE VIAL GLASS SM (MISCELLANEOUS) ×2 IMPLANT
GAUZE PACKING FOLDED 2  STR (GAUZE/BANDAGES/DRESSINGS) ×1
GAUZE PACKING FOLDED 2 STR (GAUZE/BANDAGES/DRESSINGS) ×1 IMPLANT
GAUZE SPONGE 4X4 16PLY XRAY LF (GAUZE/BANDAGES/DRESSINGS) IMPLANT
GLOVE BIO SURGEON STRL SZ 6.5 (GLOVE) ×2 IMPLANT
GLOVE BIO SURGEON STRL SZ7 (GLOVE) IMPLANT
GLOVE BIO SURGEON STRL SZ7.5 (GLOVE) ×2 IMPLANT
GLOVE BIOGEL PI IND STRL 7.0 (GLOVE) ×1 IMPLANT
GLOVE BIOGEL PI INDICATOR 7.0 (GLOVE) ×1
GOWN STRL NON-REIN LRG LVL3 (GOWN DISPOSABLE) ×2 IMPLANT
GOWN STRL REIN XL XLG (GOWN DISPOSABLE) ×2 IMPLANT
KIT BASIN OR (CUSTOM PROCEDURE TRAY) ×2 IMPLANT
KIT ROOM TURNOVER OR (KITS) ×2 IMPLANT
NEEDLE 22X1 1/2 (OR ONLY) (NEEDLE) ×2 IMPLANT
NEEDLE BLUNT 16X1.5 OR ONLY (NEEDLE) IMPLANT
NS IRRIG 1000ML POUR BTL (IV SOLUTION) ×2 IMPLANT
PAD ARMBOARD 7.5X6 YLW CONV (MISCELLANEOUS) ×4 IMPLANT
SUT CHROMIC 3 0 PS 2 (SUTURE) ×2 IMPLANT
SYR 50ML SLIP (SYRINGE) IMPLANT
TOWEL OR 17X24 6PK STRL BLUE (TOWEL DISPOSABLE) ×2 IMPLANT
TOWEL OR 17X26 10 PK STRL BLUE (TOWEL DISPOSABLE) ×2 IMPLANT
TRAY ENT MC OR (CUSTOM PROCEDURE TRAY) ×2 IMPLANT
TUBING IRRIGATION (MISCELLANEOUS) ×2 IMPLANT
WATER STERILE IRR 1000ML POUR (IV SOLUTION) IMPLANT
YANKAUER SUCT BULB TIP NO VENT (SUCTIONS) ×2 IMPLANT

## 2012-10-23 NOTE — Op Note (Signed)
10/23/2012  8:03 AM  PATIENT:  Danny Hinton  32 y.o. male  PRE-OPERATIVE DIAGNOSIS:  NON RESTORABLE TEETH #'s 1, 16, 17, 30, 31  POST-OPERATIVE DIAGNOSIS:  SAME  PROCEDURE:  Procedure(s): EXTRACTION TEETH 1, 16, 17, 30, 31  SURGEON:  Surgeon(s): [name], DDS  ANESTHESIA:   local and general  EBL:  minimal  DRAINS: none   SPECIMEN:  No Specimen  COUNTS:  YES  PLAN OF CARE: Discharge to home after PACU  PATIENT DISPOSITION:  PACU - hemodynamically stable.   PROCEDURE DETAILS: Dictation #[id number]  [name], DMD 10/23/2012 8:03 AM

## 2012-10-23 NOTE — Op Note (Signed)
NAME:  Danny Hinton, Danny Hinton                   ACCOUNT NO.:  [id number]  MEDICAL RECORD NO.:  [id number]  LOCATION:  [facility]                         FACILITY:  [facility]  PHYSICIAN:  [name], M.D.  DATE OF BIRTH:  1980-12-08  DATE OF PROCEDURE:  10/23/2012 DATE OF DISCHARGE:                              OPERATIVE REPORT   PREOPERATIVE DIAGNOSIS:  Nonrestorable teeth #1, #16, #17, #30, #31.  POSTOPERATIVE DIAGNOSIS:  Nonrestorable teeth #1, #16, #17, #30, #31.  PROCEDURE:  Extraction of teeth #1, #16, #17, #30, #31.  SURGEON:  [name], M.D.  ANESTHESIA:  General.  INDICATIONS FOR PROCEDURE:  Danny Hinton is a 32 year old male who is referred to me by his general dentist for removal of multiple nonrestorable teeth.  He has past medical history significant for congestive heart failure, hypertension, pacemaker, eczema, nonischemic cardiomyopathy, and paroxysmal atrial fibrillation, currently takes Coumadin 5 mg per day.  Because of the patient's medical condition and the need for adequate anesthesia, it was recommended that the surgery be performed using general anesthesia with airway protection via intubation.  PROCEDURE:  The patient was taken to the operating room, placed on the table in a supine position.  The patient was placed into a state of general anesthesia via intravenous medications and oral endotracheal tube was placed and marked.  The eyes were protected.  The patient was draped for the procedure.  Time-out was performed.  Posterior pharynx was suctioned and a throat pack was placed.  2% lidocaine with 1:100,000 epinephrine was infiltrated in an inferior alveolar block on the right and left side and a buccal and palatal infiltration of the maxilla around the teeth to be removed.  Bite block was placed in the right side of the mouth and a sweetheart retractor was used to retract the tongue. A 15-blade was used to make an incision around teeth #16 and #17.  The periosteum was  reflected from around these teeth.  The teeth were elevated with a 301 elevator and removed from the mouth with a dental forceps.  Sockets were curetted, irrigated, and closed with 3-0 chromic. The bite-block was repositioned to the other side of the mouth and endotracheal tube was also repositioned, then a 15-blade was used to make an incision around teeth #30 and #31, and around tooth #1.  The periosteum was reflected.  The teeth were elevated.  Tooth #30 required sectioning with the Stryker handpiece with a fissure bur under irrigation.  The teeth were then removed using the forceps.  The sockets were curetted, irrigated, and closed with 3-0 chromic.  The oral cavity was then irrigated and suctioned.  Throat pack was removed.  The patient was awakened, taken to the recovery room, breathing spontaneously in good condition.  EBL:  Minimum.  COMPLICATIONS:  None.  SPECIMENS:  None.     [name], M.D.     SMJ/MEDQ  D:  10/23/2012  T:  10/23/2012  Job:  [id number]

## 2012-10-23 NOTE — Anesthesia Preprocedure Evaluation (Signed)
Anesthesia Evaluation  Patient identified by MRN, date of birth, ID band Patient awake    Reviewed: Allergy & Precautions, H&P , NPO status , Patient's Chart, lab work & pertinent test results  Airway Mallampati: II      Dental   Pulmonary neg pulmonary ROS,          Cardiovascular hypertension, +CHF + pacemaker + Cardiac Defibrillator Rhythm:Regular Rate:Normal     Neuro/Psych    GI/Hepatic Neg liver ROS,   Endo/Other  negative endocrine ROS  Renal/GU      Musculoskeletal   Abdominal   Peds  Hematology   Anesthesia Other Findings   Reproductive/Obstetrics                           Anesthesia Physical Anesthesia Plan  ASA: IV  Anesthesia Plan: General   Post-op Pain Management:    Induction: Intravenous  Airway Management Planned: Oral ETT  Additional Equipment:   Intra-op Plan:   Post-operative Plan: Possible Post-op intubation/ventilation  Informed Consent: I have reviewed the patients History and Physical, chart, labs and discussed the procedure including the risks, benefits and alternatives for the proposed anesthesia with the patient or authorized representative who has indicated his/her understanding and acceptance.     Plan Discussed with: CRNA, Anesthesiologist and Surgeon  Anesthesia Plan Comments:         Anesthesia Quick Evaluation

## 2012-10-23 NOTE — Anesthesia Procedure Notes (Signed)
Procedure Name: Intubation Date/Time: 10/23/2012 7:44 AM Performed by: Marena Chancy Pre-anesthesia Checklist: Patient identified, Timeout performed, Emergency Drugs available, Suction available and Patient being monitored Patient Re-evaluated:Patient Re-evaluated prior to inductionOxygen Delivery Method: Circle system utilized Preoxygenation: Pre-oxygenation with 100% oxygen Intubation Type: IV induction Ventilation: Oral airway inserted - appropriate to patient size and Mask ventilation without difficulty Tube type: Oral Number of attempts: 1 Airway Equipment and Method: Video-laryngoscopy Placement Confirmation: breath sounds checked- equal and bilateral and positive ETCO2 Secured at: 24 cm Tube secured with: Tape Dental Injury: Teeth and Oropharynx as per pre-operative assessment

## 2012-10-23 NOTE — H&P (Signed)
H&P documentation  -History and Physical Reviewed  -Patient has been re-examined  -No change in the plan of care  Danny Hinton M  

## 2012-10-23 NOTE — Preoperative (Signed)
Beta Blockers   Reason not to administer Beta Blockers:took coreg today  

## 2012-10-23 NOTE — Anesthesia Postprocedure Evaluation (Signed)
  Anesthesia Post-op Note  Patient: Danny Hinton  Procedure(s) Performed: Procedure(s): EXTRACTION TEETH 1, 16, 17, 30, 31 (N/A)  Patient Location: PACU  Anesthesia Type:General  Level of Consciousness: awake  Airway and Oxygen Therapy: Patient Spontanous Breathing  Post-op Pain: mild  Post-op Assessment: Post-op Vital signs reviewed  Post-op Vital Signs: Reviewed  Complications: No apparent anesthesia complications

## 2012-10-23 NOTE — Transfer of Care (Signed)
Immediate Anesthesia Transfer of Care Note  Patient: Danny Hinton  Procedure(s) Performed: Procedure(s): EXTRACTION TEETH 1, 16, 17, 30, 31 (N/A)  Patient Location: PACU  Anesthesia Type:General  Level of Consciousness: awake, alert  and oriented  Airway & Oxygen Therapy: Patient Spontanous Breathing and Patient connected to face mask oxygen  Post-op Assessment: Report given to PACU RN and Post -op Vital signs reviewed and stable  Post vital signs: Reviewed and stable  Complications: No apparent anesthesia complications

## 2012-10-24 ENCOUNTER — Encounter (HOSPITAL_COMMUNITY): Payer: Self-pay | Admitting: Oral Surgery

## 2012-11-06 ENCOUNTER — Ambulatory Visit (INDEPENDENT_AMBULATORY_CARE_PROVIDER_SITE_OTHER): Payer: Medicare Other | Admitting: Pharmacist

## 2012-11-06 DIAGNOSIS — Z7901 Long term (current) use of anticoagulants: Secondary | ICD-10-CM

## 2012-11-06 DIAGNOSIS — I4891 Unspecified atrial fibrillation: Secondary | ICD-10-CM

## 2012-11-06 NOTE — Patient Instructions (Signed)
Anticoagulation Dose Instructions as of 11/06/2012     Danny Hinton Tue Wed Thu Fri Sat   New Dose 5 mg 5 mg 5 mg 5 mg 5 mg 5 mg 5 mg    Description       10mg  for 1 day, then resume 5mg  daily       INR was 1.9 today

## 2012-12-25 ENCOUNTER — Telehealth: Payer: Self-pay | Admitting: Pharmacist

## 2012-12-25 NOTE — Telephone Encounter (Signed)
Patient due protime - missed appt 12/07/12. Called to remind - LM to call office to make appt

## 2012-12-27 ENCOUNTER — Encounter: Payer: Self-pay | Admitting: Cardiology

## 2012-12-27 DIAGNOSIS — I5022 Chronic systolic (congestive) heart failure: Secondary | ICD-10-CM

## 2012-12-27 DIAGNOSIS — I5042 Chronic combined systolic (congestive) and diastolic (congestive) heart failure: Secondary | ICD-10-CM | POA: Insufficient documentation

## 2012-12-27 DIAGNOSIS — Z95810 Presence of automatic (implantable) cardiac defibrillator: Secondary | ICD-10-CM | POA: Insufficient documentation

## 2012-12-27 DIAGNOSIS — I10 Essential (primary) hypertension: Secondary | ICD-10-CM | POA: Insufficient documentation

## 2012-12-27 DIAGNOSIS — I428 Other cardiomyopathies: Secondary | ICD-10-CM | POA: Insufficient documentation

## 2012-12-27 DIAGNOSIS — E1159 Type 2 diabetes mellitus with other circulatory complications: Secondary | ICD-10-CM | POA: Insufficient documentation

## 2012-12-27 DIAGNOSIS — Z7901 Long term (current) use of anticoagulants: Secondary | ICD-10-CM | POA: Insufficient documentation

## 2012-12-28 ENCOUNTER — Encounter: Payer: Self-pay | Admitting: Cardiovascular Disease

## 2012-12-29 ENCOUNTER — Ambulatory Visit (INDEPENDENT_AMBULATORY_CARE_PROVIDER_SITE_OTHER): Payer: Medicare Other | Admitting: Cardiovascular Disease

## 2012-12-29 ENCOUNTER — Encounter: Payer: Self-pay | Admitting: Cardiovascular Disease

## 2012-12-29 VITALS — BP 114/60 | HR 65 | Resp 20 | Ht 77.0 in | Wt 392.6 lb

## 2012-12-29 DIAGNOSIS — I428 Other cardiomyopathies: Secondary | ICD-10-CM | POA: Diagnosis not present

## 2012-12-29 DIAGNOSIS — I4891 Unspecified atrial fibrillation: Secondary | ICD-10-CM

## 2012-12-29 DIAGNOSIS — I509 Heart failure, unspecified: Secondary | ICD-10-CM | POA: Diagnosis not present

## 2012-12-29 DIAGNOSIS — Z95810 Presence of automatic (implantable) cardiac defibrillator: Secondary | ICD-10-CM | POA: Diagnosis not present

## 2012-12-29 DIAGNOSIS — I5022 Chronic systolic (congestive) heart failure: Secondary | ICD-10-CM

## 2012-12-29 DIAGNOSIS — I10 Essential (primary) hypertension: Secondary | ICD-10-CM

## 2012-12-29 LAB — ICD DEVICE OBSERVATION
AL IMPEDENCE ICD: 440 Ohm
ATRIAL PACING ICD: 0 pct
BAMS-0001: 180 {beats}/min
RV LEAD IMPEDENCE ICD: 410 Ohm
TZON-0003ATACH: 333.3 ms

## 2012-12-29 NOTE — Patient Instructions (Addendum)
We will continue to monitor your ICD via the [facility] monitor. You will receive letters after the automatic transmissions informing you that it was received, and when the next transmission is scheduled to automatically send again.   Your physician recommends that you schedule a follow-up appointment in: 12 months

## 2012-12-29 NOTE — Progress Notes (Signed)
In office ICD interrogation. Normal function. No changes made this session. 

## 2012-12-31 ENCOUNTER — Encounter: Payer: Self-pay | Admitting: Cardiovascular Disease

## 2012-12-31 NOTE — Progress Notes (Signed)
Patient ID: Danny Hinton, male   DOB: 1980-06-30, 32 y.o.   MRN: [id number]    Reason for office visit Congestive heart failure followup  Danny has severe nonischemic cardiomyopathy with an ejection fraction of 30% or less. He has had repeated episodes of paroxysmal atrial fibrillation and is on chronic anticoagulation. He does not have known coronary disease. currently done well since his last appointment. He has been granted disability which has eased his financial problems a little bit but still has a lot of concerns. His wife has multiple sclerosis and also is on disability. He has not had any overt acute heart failure exacerbations and is fairly compliant with sodium restriction. His weight remains steady. He has not made any progress with true weight loss and remains severely obese.   A full ICD interrogation was performed in the office today. Has a dual-chamber St. Jude defibrillator that is functioning normally. The device has not recorded recent episodes of atrial fibrillation. His corvue thoracic impedance monitor shows a couple of episodes of possible hypervolemia that both resolved spontaneously. One of these episodes is ongoing but the patient does not have any signs or symptoms of heart failure. Generator and lead parameters are within normal range. Pacing thresholds were actively tested but no permanent changes are made to the device settings during this visit    No Known Allergies  Current Outpatient Prescriptions  Medication Sig Dispense Refill  . Ascorbic Acid (VITAMIN C PO) Take 1 tablet by mouth daily.      Marland Kitchen aspirin EC 81 MG tablet Take 81 mg by mouth daily.      . carvedilol (COREG) 25 MG tablet Take 25 mg by mouth 2 (two) times daily with a meal.      . clobetasol cream (TEMOVATE) 0.05 % Apply 1 application topically 2 (two) times daily as needed (for eczema).      . furosemide (LASIX) 40 MG tablet Take 40 mg by mouth daily as needed (for swelling). Swelling      . lisinopril  (PRINIVIL,ZESTRIL) 20 MG tablet Take 20 mg by mouth 2 (two) times daily.      . Omega-3 Fatty Acids (FISH OIL) 1000 MG CAPS Take 1,000 mg by mouth 2 (two) times daily.      Marland Kitchen spironolactone (ALDACTONE) 25 MG tablet Take 1 tablet (25 mg total) by mouth daily.  30 tablet  5  . warfarin (COUMADIN) 5 MG tablet Resume Warfarin tomorrow, October 24, 2012.  30 tablet  5   No current facility-administered medications for this visit.    Past Medical History  Diagnosis Date  . Chronic systolic congestive heart failure   . Hypertension   . Eczema   . PAF (paroxysmal atrial fibrillation)   . Automatic implantable cardioverter-defibrillator in situ     St Jude  . Chronic anticoagulation   . Nonischemic cardiomyopathy     EF 30% 2011 echo    Past Surgical History  Procedure Laterality Date  . Cardiac defibrillator placement  02/17/10    St. Jude Medical 45DR, model number [id number], serial number [id number]  . Tooth extraction N/A 10/23/2012    Procedure: EXTRACTION TEETH 1, 16, 17, 30, 31;  Surgeon: [name], DDS;  Location: [facility] OR;  Service: Oral Surgery;  Laterality: N/A;    Family History  Problem Relation Age of Onset  . Cancer Brother   . Heart disease Maternal Grandfather   . Diabetes Paternal Grandmother     History   Social History  .  Marital Status: Married    Spouse Name: N/A    Number of Children: N/A  . Years of Education: N/A   Occupational History  . Not on file.   Social History Main Topics  . Smoking status: Never Smoker   . Smokeless tobacco: Not on file  . Alcohol Use: No  . Drug Use: No  . Sexual Activity: Not on file   Other Topics Concern  . Not on file   Social History Narrative  . No narrative on file    Review of systems: The patient specifically denies any chest pain at rest or with exertion, dyspnea at rest or with exertion, orthopnea, paroxysmal nocturnal dyspnea, syncope, palpitations, focal neurological deficits, intermittent claudication,  lower extremity edema, unexplained weight gain, cough, hemoptysis or wheezing.  The patient also denies abdominal pain, nausea, vomiting, dysphagia, diarrhea, constipation, polyuria, polydipsia, dysuria, hematuria, frequency, urgency, abnormal bleeding or bruising, fever, chills, unexpected weight changes, mood swings, change in skin or hair texture, change in voice quality, auditory or visual problems, allergic reactions or rashes, new musculoskeletal complaints other than usual "aches and pains".   PHYSICAL EXAM BP 114/60  Pulse 65  Resp 20  Ht 6\' 5"  (1.956 m)  Wt 392 lb 9.6 oz (178.082 kg)  BMI 46.55 kg/m2  General: Alert, oriented x3, no distress, morbidly obese Head: no evidence of trauma, PERRL, EOMI, no exophtalmos or lid lag, no myxedema, no xanthelasma; normal ears, nose and oropharynx Neck: normal jugular venous pulsations and no hepatojugular reflux; brisk carotid pulses without delay and no carotid bruits Chest: clear to auscultation, no signs of consolidation by percussion or palpation, normal fremitus, symmetrical and full respiratory excursions Cardiovascular: Unable to define position and quality of the apical impulse, regular rhythm, normal first and second heart sounds, no murmurs, rubs or gallops Abdomen: no tenderness or distention, no masses by palpation, no abnormal pulsatility or arterial bruits, normal bowel sounds, no hepatosplenomegaly Extremities: no clubbing, cyanosis or edema; 2+ radial, ulnar and brachial pulses bilaterally; 2+ right femoral, posterior tibial and dorsalis pedis pulses; 2+ left femoral, posterior tibial and dorsalis pedis pulses; no subclavian or femoral bruits Neurological: grossly nonfocal   EKG: Normal sinus rhythm, borderline QRS duration 100 ms, normal QT, no repolarization abnormality  Lipid Panel     Component Value Date/Time   CHOL  Value: 170        ATP III CLASSIFICATION:  <200     mg/dL   Desirable  478-295  mg/dL   Borderline High   >=621    mg/dL   High        30/86/5784 0410   TRIG 110 03/20/2009 0410   HDL 32* 03/20/2009 0410   CHOLHDL 5.3 03/20/2009 0410   VLDL 22 03/20/2009 0410   LDLCALC  Value: 116        Total Cholesterol/HDL:CHD Risk Coronary Heart Disease Risk Table                     Men   Women  1/2 Average Risk   3.4   3.3  Average Risk       5.0   4.4  2 X Average Risk   9.6   7.1  3 X Average Risk  23.4   11.0        Use the calculated Patient Ratio above and the CHD Risk Table to determine the patient's CHD Risk.        ATP III CLASSIFICATION (LDL):  <100  mg/dL   Optimal  161-096  mg/dL   Near or Above                    Optimal  130-159  mg/dL   Borderline  045-409  mg/dL   High  >811     mg/dL   Very High* 91/47/8295 0410    BMET    Component Value Date/Time   NA 135 10/17/2012 0921   K 4.1 10/17/2012 0921   CL 101 10/17/2012 0921   CO2 25 10/17/2012 0921   GLUCOSE 94 10/17/2012 0921   BUN 10 10/17/2012 0921   CREATININE 0.75 10/17/2012 0921   CREATININE 0.78 07/27/2012 1330   CALCIUM 9.0 10/17/2012 0921   GFRNONAA >90 10/17/2012 0921   GFRAA >90 10/17/2012 0921     ASSESSMENT AND PLAN Atrial fibrillation On chronic warfarin and beta blocker therapy no symptoms to suggest embolic events. INR checked today.  Chronic systolic congestive heart failure He maintains euvolemia on a low dose of loop diuretic. He is on maximum doses of beta blocker and ACE inhibitors as well as Aldactone. NYHA functional class II. 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 His medications with put him at high risk of hemodynamic complications when exposed to outside CT and dehydration.  Non-ischemic cardiomyopathy EF most recently estimated to be around 30%  Automatic implantable cardioverter-defibrillator in situ St. Jude fortify dual chamber device implanted October 2011. Monitored via the Merlin remote system. Normal function.  Orders  Placed This Encounter  Procedures  . ICD Device Observation  . EKG 12-Lead   No orders of the defined types were placed in this encounter.    Junious Silk, MD, [facility] [phone] office [phone] pager

## 2012-12-31 NOTE — Assessment & Plan Note (Signed)
He maintains euvolemia on a low dose of loop diuretic. He is on maximum doses of beta blocker and ACE inhibitors as well as Aldactone. NYHA functional class II. 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 His medications with put him at high risk of hemodynamic complications when exposed to outside CT and dehydration.

## 2012-12-31 NOTE — Assessment & Plan Note (Signed)
Good control

## 2012-12-31 NOTE — Assessment & Plan Note (Signed)
On chronic warfarin and beta blocker therapy no symptoms to suggest embolic events. INR checked today.

## 2012-12-31 NOTE — Assessment & Plan Note (Signed)
EF most recently estimated to be around 30%

## 2012-12-31 NOTE — Assessment & Plan Note (Signed)
St. Jude fortify dual chamber device implanted October 2011. Monitored via the [employer] remote system. Normal function.

## 2013-01-11 ENCOUNTER — Telehealth: Payer: Self-pay | Admitting: Family Medicine

## 2013-01-12 NOTE — Telephone Encounter (Signed)
NTBS.

## 2013-01-12 NOTE — Telephone Encounter (Signed)
patient aware to make an appointment

## 2013-01-29 ENCOUNTER — Telehealth: Payer: Self-pay | Admitting: Pharmacist

## 2013-01-29 NOTE — Telephone Encounter (Signed)
Protime due - last was 12/29/12 in [facility].  Patient called to make appt to recheck.   Spoke with patient and appt made for 02/07/2013 at 8:15am

## 2013-02-07 ENCOUNTER — Ambulatory Visit (INDEPENDENT_AMBULATORY_CARE_PROVIDER_SITE_OTHER): Payer: Medicare Other | Admitting: Pharmacist

## 2013-02-07 DIAGNOSIS — Z23 Encounter for immunization: Secondary | ICD-10-CM

## 2013-02-07 DIAGNOSIS — I4891 Unspecified atrial fibrillation: Secondary | ICD-10-CM | POA: Diagnosis not present

## 2013-02-07 DIAGNOSIS — Z7901 Long term (current) use of anticoagulants: Secondary | ICD-10-CM | POA: Diagnosis not present

## 2013-02-07 LAB — POCT INR: INR: 2.9

## 2013-02-07 NOTE — Patient Instructions (Signed)
Anticoagulation Dose Instructions as of 02/07/2013     Danny Hinton Tue Wed Thu Fri Sat   New Dose 5 mg 5 mg 5 mg 5 mg 5 mg 5 mg 5 mg    Description       Continue 5mg  or 1 tablet daily       INR was 2.9 today

## 2013-02-21 ENCOUNTER — Encounter: Payer: Self-pay | Admitting: Cardiovascular Disease

## 2013-03-12 ENCOUNTER — Encounter: Payer: Self-pay | Admitting: Family Medicine

## 2013-03-12 ENCOUNTER — Ambulatory Visit (INDEPENDENT_AMBULATORY_CARE_PROVIDER_SITE_OTHER): Payer: Medicare Other | Admitting: Family Medicine

## 2013-03-12 VITALS — BP 117/73 | HR 73 | Temp 98.6°F | Ht 77.0 in | Wt 386.0 lb

## 2013-03-12 DIAGNOSIS — L259 Unspecified contact dermatitis, unspecified cause: Secondary | ICD-10-CM

## 2013-03-12 DIAGNOSIS — Z7901 Long term (current) use of anticoagulants: Secondary | ICD-10-CM

## 2013-03-12 DIAGNOSIS — I509 Heart failure, unspecified: Secondary | ICD-10-CM

## 2013-03-12 DIAGNOSIS — I4891 Unspecified atrial fibrillation: Secondary | ICD-10-CM

## 2013-03-12 DIAGNOSIS — J069 Acute upper respiratory infection, unspecified: Secondary | ICD-10-CM

## 2013-03-12 DIAGNOSIS — L309 Dermatitis, unspecified: Secondary | ICD-10-CM

## 2013-03-12 MED ORDER — AMOXICILLIN 875 MG PO TABS: 875.0000 mg | ORAL_TABLET | Freq: Two times a day (BID) | ORAL | Status: DC

## 2013-03-12 MED ORDER — CLOBETASOL PROPIONATE 0.05 % EX CREA: 1.0000 "application " | TOPICAL_CREAM | Freq: Two times a day (BID) | CUTANEOUS | Status: DC | PRN

## 2013-03-12 NOTE — Patient Instructions (Addendum)
Upper Respiratory Infection, Adult An upper respiratory infection (URI) is also known as the common cold. It is often caused by a type of germ (virus). Colds are easily spread (contagious). You can pass it to others by kissing, coughing, sneezing, or drinking out of the same glass. Usually, you get better in 1 or 2 weeks.  HOME CARE   Only take medicine as told by your doctor.  Use a warm mist humidifier or breathe in steam from a hot shower.  Drink enough water and fluids to keep your pee (urine) clear or pale yellow.  Get plenty of rest.  Return to work when your temperature is back to normal or as told by your doctor. You may use a face mask and wash your hands to stop your cold from spreading. GET HELP RIGHT AWAY IF:   After the first few days, you feel you are getting worse.  You have questions about your medicine.  You have chills, shortness of breath, or brown or red spit (mucus).  You have yellow or brown snot (nasal discharge) or pain in the face, especially when you bend forward.  You have a fever, puffy (swollen) neck, pain when you swallow, or white spots in the back of your throat.  You have a bad headache, ear pain, sinus pain, or chest pain.  You have a high-pitched whistling sound when you breathe in and out (wheezing).  You have a lasting cough or cough up blood.  You have sore muscles or a stiff neck. MAKE SURE YOU:   Understand these instructions.  Will watch your condition.  Will get help right away if you are not doing well or get worse. Document Released: 10/13/2007 Document Revised: 07/19/2011 Document Reviewed: 08/31/2010 [facility] Patient Information 2014 [place], [place].  Anticoagulation Dose Instructions as of 03/12/2013     Glynis Smiles Tue Wed Thu Fri Sat   New Dose 5 mg 5 mg 5 mg 5 mg 5 mg 5 mg 5 mg    Description       Take 2 coumadin tablets today and then continue one po qd and then follow Up in 1 months

## 2013-03-12 NOTE — Progress Notes (Signed)
  Subjective:    Patient ID: [name] Dellis, male    DOB: 04/29/1981, 32 y.o.   MRN: [id number]  HPI This 32 y.o. male presents for evaluation of protime, CHF, uri sx's, And routine visit.  He is due for protime today.  He has seen Cardiology 3 months ago.  He has been doing fine otherwise. He admits he missed a dose of coumadin and usually will take 2 when this happens.  He has been doing fine otherwise.   Review of Systems C/o uri sx's and productive mucopurulent cough. No chest pain, SOB, HA, dizziness, vision change, N/V, diarrhea, constipation, dysuria, urinary urgency or frequency, myalgias, arthralgias or rash.     Objective:   Physical Exam Vital signs noted  Well developed well nourished male.  HEENT - Head atraumatic Normocephalic                Eyes - PERRLA, Conjuctiva - clear Sclera- Clear EOMI                Ears - EAC's Wnl TM's Wnl Gross Hearing WNL                Nose - Nares patent                 Throat - oropharanx wnl Respiratory - Lungs CTA bilateral Cardiac - RRR S1 and S2 without murmur GI - Abdomen soft Nontender and bowel sounds active x 4 Extremities - No edema. Neuro - Grossly intact.       Assessment & Plan:  URI (upper respiratory infection) - Plan: amoxicillin (AMOXIL) 875 MG tablet po bid x 10 day.  Eczema - Plan: clobetasol cream (TEMOVATE) 0.05 %  CHF (congestive heart failure) - Follow up with Cardiology  Atrial fibrillation - Continue coumadin therapy and follow up with Cardiology  Long term (current) use of anticoagulants - Take 2 coumadin tonight and then one every day and then follow Up in a month.

## 2013-03-26 ENCOUNTER — Telehealth: Payer: Self-pay | Admitting: Family Medicine

## 2013-03-26 NOTE — Telephone Encounter (Signed)
Symptoms have improved but he continues to have a mild cough and nasal congestion. This is a seasonal problem for him. He has CHF and is on multiple medications therefore most OTC meds aren't an option. Suggested that he may need to come back in for a f/u but that I would forward this to his provider to see if he had any suggestions for safe OTC meds.

## 2013-03-26 NOTE — Telephone Encounter (Signed)
Try mucinex otc 2 po bid

## 2013-03-27 NOTE — Telephone Encounter (Signed)
Patient is aware 

## 2013-04-01 ENCOUNTER — Other Ambulatory Visit: Payer: Self-pay | Admitting: Cardiovascular Disease

## 2013-04-02 NOTE — Telephone Encounter (Signed)
Rx was sent to pharmacy electronically. 

## 2013-04-16 ENCOUNTER — Ambulatory Visit (INDEPENDENT_AMBULATORY_CARE_PROVIDER_SITE_OTHER): Payer: Medicare Other

## 2013-04-16 ENCOUNTER — Ambulatory Visit (INDEPENDENT_AMBULATORY_CARE_PROVIDER_SITE_OTHER): Payer: Medicare Other | Admitting: Pharmacist

## 2013-04-16 DIAGNOSIS — I5022 Chronic systolic (congestive) heart failure: Secondary | ICD-10-CM

## 2013-04-16 DIAGNOSIS — I509 Heart failure, unspecified: Secondary | ICD-10-CM | POA: Diagnosis not present

## 2013-04-16 DIAGNOSIS — I4891 Unspecified atrial fibrillation: Secondary | ICD-10-CM

## 2013-04-16 DIAGNOSIS — I428 Other cardiomyopathies: Secondary | ICD-10-CM

## 2013-04-16 DIAGNOSIS — Z7901 Long term (current) use of anticoagulants: Secondary | ICD-10-CM

## 2013-04-16 LAB — ICD DEVICE OBSERVATION

## 2013-04-16 NOTE — Patient Instructions (Signed)
Anticoagulation Dose Instructions as of 04/16/2013     Danny Hinton Tue Wed Thu Fri Sat   New Dose 5 mg 5 mg 5 mg 5 mg 5 mg 5 mg 5 mg    Description       Continue one tablet daily.      INR was 2.2 today

## 2013-04-27 ENCOUNTER — Encounter: Payer: Self-pay | Admitting: *Deleted

## 2013-04-27 LAB — MDC_IDC_ENUM_SESS_TYPE_REMOTE
Battery Remaining Percentage: 68 %
Lead Channel Impedance Value: 440 Ohm
Lead Channel Sensing Intrinsic Amplitude: 11.7 mV
Lead Channel Setting Pacing Amplitude: 2 V
Lead Channel Setting Pacing Pulse Width: 0.5 ms
Zone Setting Detection Interval: 333.3 ms

## 2013-05-14 ENCOUNTER — Other Ambulatory Visit: Payer: Self-pay | Admitting: *Deleted

## 2013-05-14 ENCOUNTER — Telehealth: Payer: Self-pay | Admitting: Cardiovascular Disease

## 2013-05-14 MED ORDER — SPIRONOLACTONE 25 MG PO TABS: 25.0000 mg | ORAL_TABLET | Freq: Every day | ORAL | Status: DC

## 2013-05-14 NOTE — Telephone Encounter (Signed)
Refill already completed.

## 2013-05-14 NOTE — Telephone Encounter (Signed)
Need refill on Spironolactone 25 mg #30

## 2013-05-21 ENCOUNTER — Encounter: Payer: Medicare Other | Admitting: *Deleted

## 2013-05-28 ENCOUNTER — Encounter: Payer: Self-pay | Admitting: *Deleted

## 2013-05-29 ENCOUNTER — Encounter: Payer: Self-pay | Admitting: Pharmacist Clinician (PhC)/ Clinical Pharmacy Specialist

## 2013-06-04 ENCOUNTER — Ambulatory Visit: Payer: Medicare Other | Admitting: Family Medicine

## 2013-06-04 DIAGNOSIS — IMO0002 Reserved for concepts with insufficient information to code with codable children: Secondary | ICD-10-CM | POA: Diagnosis not present

## 2013-06-04 DIAGNOSIS — M545 Low back pain, unspecified: Secondary | ICD-10-CM | POA: Diagnosis not present

## 2013-06-18 ENCOUNTER — Telehealth: Payer: Self-pay | Admitting: Pharmacist

## 2013-06-18 NOTE — Telephone Encounter (Signed)
Patient needed early morning appt and there were only 15 minute slots available.  Will check protime 06/21/13 and make appt then in future for AWV

## 2013-06-27 ENCOUNTER — Telehealth: Payer: Self-pay | Admitting: Pharmacist

## 2013-06-27 NOTE — Telephone Encounter (Signed)
Patient no show for last 2 protime appts. Need to reschedule.  Left message on vm

## 2013-07-10 ENCOUNTER — Other Ambulatory Visit: Payer: Self-pay | Admitting: Nurse Practitioner

## 2013-07-10 MED ORDER — LINDANE 1 % EX LOTN: 1.0000 "application " | TOPICAL_LOTION | Freq: Once | CUTANEOUS | Status: DC

## 2013-07-26 ENCOUNTER — Other Ambulatory Visit: Payer: Self-pay | Admitting: Cardiovascular Disease

## 2013-08-13 ENCOUNTER — Encounter: Payer: Self-pay | Admitting: *Deleted

## 2013-08-17 ENCOUNTER — Encounter: Payer: Self-pay | Admitting: Cardiovascular Disease

## 2013-08-17 ENCOUNTER — Ambulatory Visit (INDEPENDENT_AMBULATORY_CARE_PROVIDER_SITE_OTHER): Payer: Medicare Other | Admitting: *Deleted

## 2013-08-17 DIAGNOSIS — I5022 Chronic systolic (congestive) heart failure: Secondary | ICD-10-CM

## 2013-08-17 DIAGNOSIS — I509 Heart failure, unspecified: Secondary | ICD-10-CM | POA: Diagnosis not present

## 2013-08-17 DIAGNOSIS — Z95810 Presence of automatic (implantable) cardiac defibrillator: Secondary | ICD-10-CM

## 2013-08-17 DIAGNOSIS — I428 Other cardiomyopathies: Secondary | ICD-10-CM

## 2013-08-22 LAB — MDC_IDC_ENUM_SESS_TYPE_REMOTE
Battery Remaining Longevity: 69 mo
Battery Remaining Percentage: 65 %
Battery Voltage: 2.96 V
Brady Statistic AS VS Percent: 99 %
HIGH POWER IMPEDANCE MEASURED VALUE: 43 Ohm
Lead Channel Impedance Value: 450 Ohm
Lead Channel Pacing Threshold Amplitude: 1.75 V
Lead Channel Pacing Threshold Pulse Width: 0.5 ms
Lead Channel Sensing Intrinsic Amplitude: 3.5 mV
Lead Channel Setting Pacing Amplitude: 3 V
Lead Channel Setting Sensing Sensitivity: 0.5 mV
MDC IDC MSMT LEADCHNL RV IMPEDANCE VALUE: 440 Ohm
MDC IDC MSMT LEADCHNL RV PACING THRESHOLD AMPLITUDE: 0.75 V
MDC IDC MSMT LEADCHNL RV PACING THRESHOLD PULSEWIDTH: 0.5 ms
MDC IDC MSMT LEADCHNL RV SENSING INTR AMPL: 11.7 mV
MDC IDC PG SERIAL: 621444
MDC IDC SESS DTM: 20150410203957
MDC IDC SET LEADCHNL RV PACING AMPLITUDE: 2 V
MDC IDC SET LEADCHNL RV PACING PULSEWIDTH: 0.5 ms
MDC IDC SET ZONE DETECTION INTERVAL: 270 ms
MDC IDC STAT BRADY AP VP PERCENT: 1 %
MDC IDC STAT BRADY AP VS PERCENT: 1 %
MDC IDC STAT BRADY AS VP PERCENT: 1 %
MDC IDC STAT BRADY RA PERCENT PACED: 1 %
MDC IDC STAT BRADY RV PERCENT PACED: 1 %

## 2013-08-31 ENCOUNTER — Encounter: Payer: Self-pay | Admitting: *Deleted

## 2013-09-03 ENCOUNTER — Ambulatory Visit (INDEPENDENT_AMBULATORY_CARE_PROVIDER_SITE_OTHER): Payer: Medicare Other | Admitting: Pharmacist

## 2013-09-03 DIAGNOSIS — I4891 Unspecified atrial fibrillation: Secondary | ICD-10-CM

## 2013-09-03 DIAGNOSIS — Z7901 Long term (current) use of anticoagulants: Secondary | ICD-10-CM

## 2013-09-03 LAB — POCT INR: INR: 2

## 2013-09-03 NOTE — Patient Instructions (Signed)
Anticoagulation Dose Instructions as of 09/03/2013     Danny Hinton Tue Wed Thu Fri Sat   New Dose 5 mg 5 mg 5 mg 5 mg 5 mg 5 mg 5 mg    Description       Continue one tablet daily.      INR was 2.0

## 2013-09-21 ENCOUNTER — Other Ambulatory Visit: Payer: Self-pay | Admitting: Family Medicine

## 2013-09-21 ENCOUNTER — Encounter: Payer: Self-pay | Admitting: Family Medicine

## 2013-09-21 ENCOUNTER — Telehealth: Payer: Self-pay | Admitting: Family Medicine

## 2013-09-21 ENCOUNTER — Ambulatory Visit (INDEPENDENT_AMBULATORY_CARE_PROVIDER_SITE_OTHER): Payer: Medicare Other | Admitting: Family Medicine

## 2013-09-21 VITALS — BP 129/83 | HR 77 | Temp 99.1°F | Ht 77.0 in | Wt 398.8 lb

## 2013-09-21 DIAGNOSIS — J329 Chronic sinusitis, unspecified: Secondary | ICD-10-CM | POA: Diagnosis not present

## 2013-09-21 MED ORDER — METHYLPREDNISOLONE ACETATE 80 MG/ML IJ SUSP
80.0000 mg | Freq: Once | INTRAMUSCULAR | Status: AC
  Administered 2013-09-21: 80 mg via INTRAMUSCULAR

## 2013-09-21 MED ORDER — AMOXICILLIN 875 MG PO TABS: 875.0000 mg | ORAL_TABLET | Freq: Two times a day (BID) | ORAL | Status: DC

## 2013-09-21 NOTE — Telephone Encounter (Signed)
appt made

## 2013-09-21 NOTE — Telephone Encounter (Signed)
ntbs

## 2013-09-21 NOTE — Telephone Encounter (Signed)
Danny Hinton - looks like he got some AMOX in the past NKDA

## 2013-09-24 NOTE — Progress Notes (Signed)
   Subjective:    Patient ID: Danny Hinton, male    DOB: [birth year], 33 y.o.   MRN: [id number]  HPI  This 33 y.o. male presents for evaluation of facial pressure and mucopurulent nasal discharge.  Review of Systems    No chest pain, SOB, HA, dizziness, vision change, N/V, diarrhea, constipation, dysuria, urinary urgency or frequency, myalgias, arthralgias or rash.  Objective:   Physical Exam   Vital signs noted  Well developed well nourished male.  HEENT - Head atraumatic Normocephalic                Eyes - PERRLA, Conjuctiva - clear Sclera- Clear EOMI                Ears - EAC's Wnl TM's Wnl Gross Hearing WNL                Nose - Nares patent                 Throat - oropharanx wnl Respiratory - Lungs CTA bilateral Cardiac - RRR S1 and S2 without murmur GI - Abdomen soft Nontender and bowel sounds active x 4 Extremities - No edema. Neuro - Grossly intact.     Assessment & Plan:  Sinusitis - Plan: amoxicillin (AMOXIL) 875 MG tablet, methylPREDNISolone acetate (DEPO-MEDROL) injection 80 mg  Push po fluids, rest, tylenol and motrin otc prn as directed for fever, arthralgias, and myalgias.  Follow up prn if sx's continue or persist.  Deatra Canter FNP

## 2013-10-10 ENCOUNTER — Other Ambulatory Visit: Payer: Self-pay | Admitting: Cardiovascular Disease

## 2013-10-10 NOTE — Telephone Encounter (Signed)
Rx was sent to pharmacy electronically. 

## 2013-11-05 ENCOUNTER — Ambulatory Visit: Payer: Self-pay

## 2013-11-16 ENCOUNTER — Telehealth: Payer: Self-pay | Admitting: Cardiology

## 2013-11-16 NOTE — Telephone Encounter (Signed)
Defibrillator fired. Walking in the yard, felt a "pinch", maybe palpitations and then was shocked.  No loss of consciousness. Feels fine afterwards. First time it has fired. Admits to missing his meds past 2 day (didn't feel like taking them) but now has restarted. Denies chest pain, fluid overload. Has followup on Monday with his PCP to check labs (INR, Chem7). No urgent need to come to ER but needs follow up with cardiology soon. If recurrent shock, syncope, pre-syncope, chest pain, palpitations, he is to come to ER. He voices understanding.  CC to primary cardiologist and nursing to schedule followup.

## 2013-11-18 NOTE — Telephone Encounter (Signed)
Danny Hinton, can you please have him do a download or come for device check ASAP? Amber, copied you since I am not sure Danny Hinton is back Monday Thank you! MCr

## 2013-11-19 ENCOUNTER — Ambulatory Visit (INDEPENDENT_AMBULATORY_CARE_PROVIDER_SITE_OTHER): Payer: Medicare Other | Admitting: *Deleted

## 2013-11-19 ENCOUNTER — Encounter: Payer: Self-pay | Admitting: Pharmacist

## 2013-11-19 ENCOUNTER — Ambulatory Visit (INDEPENDENT_AMBULATORY_CARE_PROVIDER_SITE_OTHER): Payer: Medicare Other | Admitting: Pharmacist

## 2013-11-19 VITALS — BP 110/70 | HR 70 | Ht 75.0 in | Wt 386.8 lb

## 2013-11-19 DIAGNOSIS — I509 Heart failure, unspecified: Secondary | ICD-10-CM

## 2013-11-19 DIAGNOSIS — I428 Other cardiomyopathies: Secondary | ICD-10-CM

## 2013-11-19 DIAGNOSIS — I480 Paroxysmal atrial fibrillation: Secondary | ICD-10-CM

## 2013-11-19 DIAGNOSIS — I5022 Chronic systolic (congestive) heart failure: Secondary | ICD-10-CM

## 2013-11-19 DIAGNOSIS — Z7901 Long term (current) use of anticoagulants: Secondary | ICD-10-CM

## 2013-11-19 DIAGNOSIS — Z0000 Encounter for general adult medical examination without abnormal findings: Secondary | ICD-10-CM

## 2013-11-19 LAB — MDC_IDC_ENUM_SESS_TYPE_INCLINIC
Brady Statistic RA Percent Paced: 0.03 %
Brady Statistic RV Percent Paced: 0 %
Date Time Interrogation Session: 20150713142602
HIGH POWER IMPEDANCE MEASURED VALUE: 45.2207
Lead Channel Impedance Value: 437.5 Ohm
Lead Channel Impedance Value: 462.5 Ohm
Lead Channel Pacing Threshold Amplitude: 0.75 V
Lead Channel Pacing Threshold Amplitude: 0.75 V
Lead Channel Pacing Threshold Amplitude: 1.25 V
Lead Channel Pacing Threshold Pulse Width: 0.5 ms
Lead Channel Sensing Intrinsic Amplitude: 11.7 mV
Lead Channel Setting Pacing Amplitude: 2 V
MDC IDC MSMT BATTERY REMAINING LONGEVITY: 72 mo
MDC IDC MSMT LEADCHNL RA PACING THRESHOLD AMPLITUDE: 1.25 V
MDC IDC MSMT LEADCHNL RA PACING THRESHOLD PULSEWIDTH: 1 ms
MDC IDC MSMT LEADCHNL RA PACING THRESHOLD PULSEWIDTH: 1 ms
MDC IDC MSMT LEADCHNL RA SENSING INTR AMPL: 4 mV
MDC IDC MSMT LEADCHNL RV PACING THRESHOLD PULSEWIDTH: 0.5 ms
MDC IDC PG SERIAL: 621444
MDC IDC SET LEADCHNL RA PACING AMPLITUDE: 2.5 V
MDC IDC SET LEADCHNL RV PACING PULSEWIDTH: 0.5 ms
MDC IDC SET LEADCHNL RV SENSING SENSITIVITY: 0.5 mV
Zone Setting Detection Interval: 270 ms

## 2013-11-19 MED ORDER — WARFARIN SODIUM 5 MG PO TABS: ORAL_TABLET | ORAL | Status: DC

## 2013-11-19 NOTE — Progress Notes (Signed)
ICD check in clinic (N/C). Normal device function. Thresholds and sensing consistent with previous device measurements. Impedance trends stable over time. No evidence of any ventricular arrhythmias or mode switches since ICD shock on 7-10. Patient admits stopping Lisinopril and Coreg x 2 days due to GI upset. Discussed importance of taking Coreg with pt. Pt voiced understanding. Patient also aware of driving restrictions due to ATP/Shock. Histogram distribution appropriate for patient and level of activity. VF intervals increased from 12->20 per [facility]. Device programmed at appropriate safety margins. Device programmed to optimize intrinsic conduction. Estimated longevity 4.9-6 years. Pt enrolled in remote follow-up. Plan to follow up with [facility] on 10-27.

## 2013-11-19 NOTE — Telephone Encounter (Signed)
Informed patient that [facility] wants to extend his VF intervals from 12 to 20. Patient states that he will call back later today to set up a time to make the change to his device.

## 2013-11-19 NOTE — Telephone Encounter (Signed)
Patient seen 7-13. Intervals extended. Patient admits to holding Coreg x 2 days. Patient informed about the importance of this medication. Patient voiced understanding. Plan to F/U with [facility] on 10-27.

## 2013-11-19 NOTE — Progress Notes (Signed)
Subjective:    Danny Hinton is a 33 y.o. male who presents for Medicare Initial Wellness Visit and recheck INR due to chronic anticoagulation secondary to paroxysmal atrial fibrillation.  Preventive Screening-Counseling & Management  Tobacco History  Smoking status  . Never Smoker   Smokeless tobacco  . Never Used   Current Problems (verified) Patient Active Problem List   Diagnosis Date Noted  . Chronic systolic congestive heart failure   . Hypertension   . Non-ischemic cardiomyopathy   . Automatic implantable cardioverter-defibrillator in situ   . Chronic anticoagulation   . Rash and nonspecific skin eruption 07/27/2012  . Paroxysmal atrial fibrillation 07/18/2012  . Long term (current) use of anticoagulants 07/18/2012    Medications Prior to Visit Current Outpatient Prescriptions on File Prior to Visit  Medication Sig Dispense Refill  . aspirin EC 81 MG tablet Take 81 mg by mouth daily.      . carvedilol (COREG) 25 MG tablet Take 1 tablet (25 mg total) by mouth 2 (two) times daily with a meal.  180 tablet  1  . clobetasol cream (TEMOVATE) 0.05 % Apply 1 application topically 2 (two) times daily as needed (for eczema).  30 g  5  . furosemide (LASIX) 40 MG tablet TAKE ONE TABLET BY MOUTH EVERY DAY AS NEEDED FOR SWELLING  30 tablet  1  . lisinopril (PRINIVIL,ZESTRIL) 20 MG tablet TAKE ONE TABLET BY MOUTH TWICE DAILY  60 tablet  5  . Omega-3 Fatty Acids (FISH OIL) 1000 MG CAPS Take 1,000 mg by mouth 2 (two) times daily.      Marland Kitchen spironolactone (ALDACTONE) 25 MG tablet Take 1 tablet (25 mg total) by mouth daily.  30 tablet  5  . Ascorbic Acid (VITAMIN C PO) Take 1 tablet by mouth daily.       No current facility-administered medications on file prior to visit.    Current Medications (verified) Current Outpatient Prescriptions  Medication Sig Dispense Refill  . aspirin EC 81 MG tablet Take 81 mg by mouth daily.      . carvedilol (COREG) 25 MG tablet Take 1 tablet (25 mg total) by  mouth 2 (two) times daily with a meal.  180 tablet  1  . clobetasol cream (TEMOVATE) 0.05 % Apply 1 application topically 2 (two) times daily as needed (for eczema).  30 g  5  . furosemide (LASIX) 40 MG tablet TAKE ONE TABLET BY MOUTH EVERY DAY AS NEEDED FOR SWELLING  30 tablet  1  . lisinopril (PRINIVIL,ZESTRIL) 20 MG tablet TAKE ONE TABLET BY MOUTH TWICE DAILY  60 tablet  5  . Omega-3 Fatty Acids (FISH OIL) 1000 MG CAPS Take 1,000 mg by mouth 2 (two) times daily.      Marland Kitchen spironolactone (ALDACTONE) 25 MG tablet Take 1 tablet (25 mg total) by mouth daily.  30 tablet  5  . warfarin (COUMADIN) 5 MG tablet Take 1 tablet daily or as directed by anticogulation clinic  90 tablet  0  . Ascorbic Acid (VITAMIN C PO) Take 1 tablet by mouth daily.       No current facility-administered medications for this visit.     Allergies (verified) Review of patient's allergies indicates no known allergies.   PAST HISTORY  Family History Family History  Problem Relation Age of Onset  . Cancer Brother 18    hodgekins lymphoma  . Heart disease Maternal Grandfather   . Diabetes Paternal Grandmother   . Heart disease Mother  artial flutter    Social History History  Substance Use Topics  . Smoking status: Never Smoker   . Smokeless tobacco: Never Used  . Alcohol Use: No    Are there smokers in your home (other than you)?  Yes  Risk Factors Current exercise habits: Home exercise routine includes walking 0.5 hrs per day.  Dietary issues discussed: has recently stopped soda intake and eating smaller portions   Cardiac risk factors: male gender, obesity (BMI >= 30 kg/m2), sedentary lifestyle and smoking/ tobacco exposure.  Depression Screen (Note: if answer to either of the following is "Yes", a more complete depression screening is indicated)   Q1: Over the past two weeks, have you felt down, depressed or hopeless? Yes  Q2: Over the past two weeks, have you felt little interest or pleasure in  doing things? No  Have you lost interest or pleasure in daily life? No  Do you often feel hopeless? No  Do you cry easily over simple problems? No  Activities of Daily Living In your present state of health, do you have any difficulty performing the following activities?:  Driving? No Managing money?  No Feeding yourself? No Getting from bed to chair? No Climbing a flight of stairs? No Preparing food and eating?: No Bathing or showering? No Getting dressed: No Getting to the toilet? No Using the toilet:No Moving around from place to place: No In the past year have you fallen or had a near fall?:No   Are you sexually active?  Yes  Do you have more than one partner?  No  Hearing Difficulties: No Do you often ask people to speak up or repeat themselves? No Do you experience ringing or noises in your ears? No Do you have difficulty understanding soft or whispered voices? No   Do you feel that you have a problem with memory? No  Do you often misplace items? No  Do you feel safe at home?  Yes  Cognitive Testing  Alert? Yes  Normal Appearance?Yes  Oriented to person? Yes  Place? Yes   Time? Yes  Recall of three objects?  Yes  Can perform simple calculations? Yes  Displays appropriate judgment?Yes  Can read the correct time from a watch face?Yes   Advanced Directives have been discussed with the patient? Yes   List the Names of Other Physician/Practitioners you currently use: 1.  Dr. Royann Shivers - Cardiologist 2.  [facility] in [place] - optomitrist  Indicate any recent Medical Services you may have received from other than [facility] providers in the past year (date may be approximate).  Immunization History  Administered Date(s) Administered  . Influenza,inj,Quad PF,36+ Mos 02/07/2013  . Td 01/18/2012    Screening Tests Health Maintenance  Topic Date Due  . Influenza Vaccine  12/08/2013  . Tetanus/tdap  01/17/2022    All answers were reviewed with the  patient and necessary referrals were made:  Henrene Pastor, [facility]   11/19/2013   History reviewed: allergies, current medications, past family history, past medical history, past social history, past surgical history and problem list   Objective:     Blood pressure 110/70, pulse 70, height 6\' 3"  (1.905 m), weight 386 lb 12.8 oz (175.451 kg). Body mass index is 48.35 kg/(m^2).  INR was 2.0 today  Assessment:     Initial Annual Medicare Wellness Visit Therapeutic Anticoagulation  Paroxysmal Atrial Fibrillation / Chronic anticoagulation therpy  Obesity - has lost 13# since last visit 09/21/13     Plan:  During the course of the visit the patient was educated and counseled about appropriate screening and preventive services including:    Pneumococcal vaccine   Influenza vaccine  Td vaccine  Diabetes screening  Nutrition counseling   Advanced directives: [facility] packet given  Patient is commended on weight loss and encouraged  to continue calorie restricting diet.  He is also to continue exercise (going today to have pacemaker reset to allow for increased exercise)  Diet review for nutrition referral? Patient declines at current time as he is seeing success with this current nutrition plan Discussed Pneumonia vaccine with patient - he refused today but I gave him information to take home to read about pneumonia vaccine and patients that it is recommended for.   Patient Instructions (the written plan) was given to the patient.  Medicare Attestation I have personally reviewed: The patient's medical and social history Their use of alcohol, tobacco or illicit drugs Their current medications and supplements The patient's functional ability including ADLs,fall risks, home safety risks, cognitive, and hearing and visual impairment Diet and physical activities Evidence for depression or mood disorders  The patient's weight, height, BMI, and HR / BP have been  recorded in the chart.  I have made referrals, counseling, and provided education to the patient based on review of the above and I have provided the patient with a written personalized care plan for preventive services.     Henrene Pastorckard, Janett Kamath, [facility]   11/19/2013

## 2013-11-19 NOTE — Patient Instructions (Addendum)
Anticoagulation Dose Instructions as of 11/19/2013     Danny Hinton Tue Wed Thu Fri Sat   New Dose 5 _0  mg 5 mg    Description       Continue one tablet daily.      INR was 2.0 today   Preventive Care for Adults A healthy lifestyle and preventive care can promote health and wellness. Preventive health guidelines for men include the following key practices:  A routine yearly physical is a good way to check with your health care provider about your health and preventative screening. It is a chance to share any concerns and updates on your health and to receive a thorough exam.  Visit your dentist for a routine exam and preventative care every 6 months. Brush your teeth twice a day and floss once a day. Good oral hygiene prevents tooth decay and gum disease.  The frequency of eye exams is based on your age, health, family medical history, use of contact lenses, and other factors. Follow your health care provider's recommendations for frequency of eye exams.  Eat a healthy diet. Foods such as vegetables, fruits, whole grains, low-fat dairy products, and lean protein foods contain the nutrients you need without too many calories. Decrease your intake of foods high in solid fats, added sugars, and salt. Eat the right amount of calories for you.Get information about a proper diet from your health care provider, if necessary.  Regular physical exercise is one of the most important things you can do for your health. Most adults should get at least 150 minutes of moderate-intensity exercise (any activity that increases your heart rate and causes you to sweat) each week. In addition, most adults need muscle-strengthening exercises on 2 or more days a week.  Maintain a healthy weight. The body mass index (BMI) is a screening tool to identify possible weight problems. It provides an estimate of body fat based on height and weight. Your health care provider can find your BMI and can help  you achieve or maintain a healthy weight.For adults 20 years and older:  A BMI below 18.5 is considered underweight.  A BMI of 18.5 to 24.9 is normal.  A BMI of 25 to 29.9 is considered overweight.  A BMI of 30 and above is considered obese.  Maintain normal blood lipids and cholesterol levels by exercising and minimizing your intake of saturated fat. Eat a balanced diet with plenty of fruit and vegetables. Blood tests for lipids and cholesterol should begin at age 36 and be repeated every 5 years. If your lipid or cholesterol levels are high, you are over 50, or you are at high risk for heart disease, you may need your cholesterol levels checked more frequently.Ongoing high lipid and cholesterol levels should be treated with medicines if diet and exercise are not working.  If you smoke, find out from your health care provider how to quit. If you do not use tobacco, do not start.  Lung cancer screening is recommended for adults aged 67-80 years who are at high risk for developing lung cancer because of a history of smoking. A yearly low-dose CT scan of the lungs is recommended for people who have at least a 30-pack-year history of smoking and are a current smoker or have quit within the past 15 years. A pack year of smoking is smoking an average of 1 pack of cigarettes a day for 1 year (for example: 1 pack a  day for 30 years or 2 packs a day for 15 years). Yearly screening should continue until the smoker has stopped smoking for at least 15 years. Yearly screening should be stopped for people who develop a health problem that would prevent them from having lung cancer treatment.  If you choose to drink alcohol, do not have more than 2 drinks per day. One drink is considered to be 12 ounces (355 mL) of beer, 5 ounces (148 mL) of wine, or 1.5 ounces (44 mL) of liquor.  Avoid use of street drugs. Do not share needles with anyone. Ask for help if you need support or instructions about stopping the  use of drugs.  High blood pressure causes heart disease and increases the risk of stroke. Your blood pressure should be checked at least every 1-2 years. Ongoing high blood pressure should be treated with medicines, if weight loss and exercise are not effective.  If you are 90-67 years old, ask your health care provider if you should take aspirin to prevent heart disease.  Diabetes screening involves taking a blood sample to check your fasting blood sugar level. This should be done once every 3 years, after age 47, if you are within normal weight and without risk factors for diabetes. Testing should be considered at a younger age or be carried out more frequently if you are overweight and have at least 1 risk factor for diabetes.  Colorectal cancer can be detected and often prevented. Most routine colorectal cancer screening begins at the age of 45 and continues through age 42. However, your health care provider may recommend screening at an earlier age if you have risk factors for colon cancer. On a yearly basis, your health care provider may provide home test kits to check for hidden blood in the stool. Use of a small camera at the end of a tube to directly examine the colon (sigmoidoscopy or colonoscopy) can detect the earliest forms of colorectal cancer. Talk to your health care provider about this at age 39, when routine screening begins. Direct exam of the colon should be repeated every 5-10 years through age 84, unless early forms of precancerous polyps or small growths are found.  People who are at an increased risk for hepatitis B should be screened for this virus. You are considered at high risk for hepatitis B if:  You were born in a country where hepatitis B occurs often. Talk with your health care provider about which countries are considered high risk.  Your parents were born in a high-risk country and you have not received a shot to protect against hepatitis B (hepatitis B  vaccine).  You have HIV or AIDS.  You use needles to inject street drugs.  You live with, or have sex with, someone who has hepatitis B.  You are a man who has sex with other men (MSM).  You get hemodialysis treatment.  You take certain medicines for conditions such as cancer, organ transplantation, and autoimmune conditions.  Hepatitis C blood testing is recommended for all people born from [birth year] through 1965 and any individual with known risks for hepatitis C.  Practice safe sex. Use condoms and avoid high-risk sexual practices to reduce the spread of sexually transmitted infections (STIs). STIs include gonorrhea, chlamydia, syphilis, trichomonas, herpes, HPV, and human immunodeficiency virus (HIV). Herpes, HIV, and HPV are viral illnesses that have no cure. They can result in disability, cancer, and death.  If you are at risk of being infected with HIV,  it is recommended that you take a prescription medicine daily to prevent HIV infection. This is called preexposure prophylaxis (PrEP). You are considered at risk if:  You are a man who has sex with other men (MSM) and have other risk factors.  You are a heterosexual man, are sexually active, and are at increased risk for HIV infection.  You take drugs by injection.  You are sexually active with a partner who has HIV.  Talk with your health care provider about whether you are at high risk of being infected with HIV. If you choose to begin PrEP, you should first be tested for HIV. You should then be tested every 3 months for as long as you are taking PrEP.  A one-time screening for abdominal aortic aneurysm (AAA) and surgical repair of large AAAs by ultrasound are recommended for men ages 16 to 68 years who are current or former smokers.  Healthy men should no longer receive prostate-specific antigen (PSA) blood tests as part of routine cancer screening. Talk with your health care provider about prostate cancer screening.  Testicular  cancer screening is not recommended for adult males who have no symptoms. Screening includes self-exam, a health care provider exam, and other screening tests. Consult with your health care provider about any symptoms you have or any concerns you have about testicular cancer.  Use sunscreen. Apply sunscreen liberally and repeatedly throughout the day. You should seek shade when your shadow is shorter than you. Protect yourself by wearing long sleeves, pants, a wide-brimmed hat, and sunglasses year round, whenever you are outdoors.  Once a month, do a whole-body skin exam, using a mirror to look at the skin on your back. Tell your health care provider about new moles, moles that have irregular borders, moles that are larger than a pencil eraser, or moles that have changed in shape or color.  Stay current with required vaccines (immunizations).  Influenza vaccine. All adults should be immunized every year.  Tetanus, diphtheria, and acellular pertussis (Td, Tdap) vaccine. An adult who has not previously received Tdap or who does not know his vaccine status should receive 1 dose of Tdap. This initial dose should be followed by tetanus and diphtheria toxoids (Td) booster doses every 10 years. Adults with an unknown or incomplete history of completing a 3-dose immunization series with Td-containing vaccines should begin or complete a primary immunization series including a Tdap dose. Adults should receive a Td booster every 10 years.  Varicella vaccine. An adult without evidence of immunity to varicella should receive 2 doses or a second dose if he has previously received 1 dose.  Human papillomavirus (HPV) vaccine. Males aged 40-21 years who have not received the vaccine previously should receive the 3-dose series. Males aged 22-26 years may be immunized. Immunization is recommended through the age of 81 years for any male who has sex with males and did not get any or all doses earlier. Immunization is  recommended for any person with an immunocompromised condition through the age of 36 years if he did not get any or all doses earlier. During the 3-dose series, the second dose should be obtained 4-8 weeks after the first dose. The third dose should be obtained 24 weeks after the first dose and 16 weeks after the second dose.  Zoster vaccine. One dose is recommended for adults aged 77 years or older unless certain conditions are present.  Measles, mumps, and rubella (MMR) vaccine. Adults born before 50 generally are considered immune to  measles and mumps. Adults born in [birth year] or later should have 1 or more doses of MMR vaccine unless there is a contraindication to the vaccine or there is laboratory evidence of immunity to each of the three diseases. A routine second dose of MMR vaccine should be obtained at least 28 days after the first dose for students attending postsecondary schools, health care workers, or international travelers. People who received inactivated measles vaccine or an unknown type of measles vaccine during 1963-1967 should receive 2 doses of MMR vaccine. People who received inactivated mumps vaccine or an unknown type of mumps vaccine before 1979 and are at high risk for mumps infection should consider immunization with 2 doses of MMR vaccine. Unvaccinated health care workers born before [birth year] who lack laboratory evidence of measles, mumps, or rubella immunity or laboratory confirmation of disease should consider measles and mumps immunization with 2 doses of MMR vaccine or rubella immunization with 1 dose of MMR vaccine.  Pneumococcal 13-valent conjugate (PCV13) vaccine. When indicated, a person who is uncertain of his immunization history and has no record of immunization should receive the PCV13 vaccine. An adult aged 61 years or older who has certain medical conditions and has not been previously immunized should receive 1 dose of PCV13 vaccine. This PCV13 should be followed with a dose  of pneumococcal polysaccharide (PPSV23) vaccine. The PPSV23 vaccine dose should be obtained at least 8 weeks after the dose of PCV13 vaccine. An adult aged 31 years or older who has certain medical conditions and previously received 1 or more doses of PPSV23 vaccine should receive 1 dose of PCV13. The PCV13 vaccine dose should be obtained 1 or more years after the last PPSV23 vaccine dose.  Pneumococcal polysaccharide (PPSV23) vaccine. When PCV13 is also indicated, PCV13 should be obtained first. All adults aged 30 years and older should be immunized. An adult younger than age 60 years who has certain medical conditions should be immunized. Any person who resides in a nursing home or long-term care facility should be immunized. An adult smoker should be immunized. People with an immunocompromised condition and certain other conditions should receive both PCV13 and PPSV23 vaccines. People with human immunodeficiency virus (HIV) infection should be immunized as soon as possible after diagnosis. Immunization during chemotherapy or radiation therapy should be avoided. Routine use of PPSV23 vaccine is not recommended for American Indians, [place] Natives, or people younger than 65 years unless there are medical conditions that require PPSV23 vaccine. When indicated, people who have unknown immunization and have no record of immunization should receive PPSV23 vaccine. One-time revaccination 5 years after the first dose of PPSV23 is recommended for people aged 19-64 years who have chronic kidney failure, nephrotic syndrome, asplenia, or immunocompromised conditions. People who received 1-2 doses of PPSV23 before age 24 years should receive another dose of PPSV23 vaccine at age 16 years or later if at least 5 years have passed since the previous dose. Doses of PPSV23 are not needed for people immunized with PPSV23 at or after age 61 years.  Meningococcal vaccine. Adults with asplenia or persistent complement component  deficiencies should receive 2 doses of quadrivalent meningococcal conjugate (MenACWY-D) vaccine. The doses should be obtained at least 2 months apart. Microbiologists working with certain meningococcal bacteria, [employer] recruits, people at risk during an outbreak, and people who travel to or live in countries with a high rate of meningitis should be immunized. A first-year college student up through age 71 years who is living in a residence  hall should receive a dose if he did not receive a dose on or after his 16th birthday. Adults who have certain high-risk conditions should receive one or more doses of vaccine.  Hepatitis A vaccine. Adults who wish to be protected from this disease, have certain high-risk conditions, work with hepatitis A-infected animals, work in hepatitis A research labs, or travel to or work in countries with a high rate of hepatitis A should be immunized. Adults who were previously unvaccinated and who anticipate close contact with an international adoptee during the first 60 days after arrival in the [place] from a country with a high rate of hepatitis A should be immunized.  Hepatitis B vaccine. Adults should be immunized if they wish to be protected from this disease, have certain high-risk conditions, may be exposed to blood or other infectious body fluids, are household contacts or sex partners of hepatitis B positive people, are clients or workers in certain care facilities, or travel to or work in countries with a high rate of hepatitis B.  Haemophilus influenzae type b (Hib) vaccine. A previously unvaccinated person with asplenia or sickle cell disease or having a scheduled splenectomy should receive 1 dose of Hib vaccine. Regardless of previous immunization, a recipient of a hematopoietic stem cell transplant should receive a 3-dose series 6-12 months after his successful transplant. Hib vaccine is not recommended for adults with HIV infection. Preventive Service /  Frequency Ages 103 to 59  Blood pressure check.** / Every 1 to 2 years.  Lipid and cholesterol check.** / Every 5 years beginning at age 35.  Hepatitis C blood test.** / For any individual with known risks for hepatitis C.  Skin self-exam. / Monthly.  Influenza vaccine. / Every year.  Tetanus, diphtheria, and acellular pertussis (Tdap, Td) vaccine.** / Consult your health care provider. 1 dose of Td every 10 years.  Varicella vaccine.** / Consult your health care provider.  HPV vaccine. / 3 doses over 6 months, if 54 or younger.  Measles, mumps, rubella (MMR) vaccine.** / You need at least 1 dose of MMR if you were born in 1957 or later. You may also need a second dose.  Pneumococcal 13-valent conjugate (PCV13) vaccine.** / Consult your health care provider.  Pneumococcal polysaccharide (PPSV23) vaccine.** / 1 to 2 doses if you smoke cigarettes or if you have certain conditions.  Meningococcal vaccine.** / 1 dose if you are age 41 to 39 years and a Market researcher living in a residence hall, or have one of several medical conditions. You may also need additional booster doses.  Hepatitis A vaccine.** / Consult your health care provider.  Hepatitis B vaccine.** / Consult your health care provider.  Haemophilus influenzae type b (Hib) vaccine.** / Consult your health care provider.

## 2013-11-26 ENCOUNTER — Telehealth: Payer: Self-pay | Admitting: Cardiovascular Disease

## 2013-11-26 NOTE — Telephone Encounter (Signed)
Pt has a pacemaker and defibrillator.Pt wants to know what his limit is before it will go off?

## 2013-11-27 NOTE — Telephone Encounter (Signed)
Informed pt the defib will start delivering therapy at 222bpm. Pt voiced understanding.

## 2013-11-28 ENCOUNTER — Telehealth: Payer: Self-pay | Admitting: Cardiovascular Disease

## 2013-11-28 NOTE — Telephone Encounter (Signed)
Pt said he was talking to you yesterday and was disconnected. Please call him today.

## 2013-11-30 NOTE — Telephone Encounter (Signed)
Spoke to pt yesterday about ICD zone.

## 2013-12-01 ENCOUNTER — Encounter: Payer: Self-pay | Admitting: Family Medicine

## 2013-12-01 ENCOUNTER — Ambulatory Visit (INDEPENDENT_AMBULATORY_CARE_PROVIDER_SITE_OTHER): Payer: Medicare Other | Admitting: Family Medicine

## 2013-12-01 VITALS — BP 100/66 | HR 66 | Temp 97.8°F | Ht 75.0 in | Wt 382.0 lb

## 2013-12-01 DIAGNOSIS — J301 Allergic rhinitis due to pollen: Secondary | ICD-10-CM | POA: Diagnosis not present

## 2013-12-01 DIAGNOSIS — I4891 Unspecified atrial fibrillation: Secondary | ICD-10-CM | POA: Diagnosis not present

## 2013-12-01 DIAGNOSIS — I480 Paroxysmal atrial fibrillation: Secondary | ICD-10-CM

## 2013-12-01 DIAGNOSIS — Z7901 Long term (current) use of anticoagulants: Secondary | ICD-10-CM | POA: Diagnosis not present

## 2013-12-01 MED ORDER — FLUTICASONE PROPIONATE 50 MCG/ACT NA SUSP: NASAL | Status: DC

## 2013-12-01 MED ORDER — AZELASTINE HCL 0.1 % NA SOLN: NASAL | Status: DC

## 2013-12-01 NOTE — Progress Notes (Signed)
Subjective:    Patient ID: Danny Hinton, male    DOB: Mar 27, 1981, 33 y.o.   MRN: [id number]  HPI Patient here today for congestion and drainage that started about 3 weeks ago. This patient has a history of congestive heart failure and has a defibrillator and is on Coumadin. He denies fever or sore throat or headache. This has been going on for 3-4 weeks. The drainage and congestion is clear in color.       Patient Active Problem List   Diagnosis Date Noted  . Severe obesity (BMI >= 40) 11/19/2013  . Chronic systolic congestive heart failure   . Hypertension   . Non-ischemic cardiomyopathy   . Automatic implantable cardioverter-defibrillator in situ   . Chronic anticoagulation   . Rash and nonspecific skin eruption 07/27/2012  . Paroxysmal atrial fibrillation 07/18/2012  . Long term (current) use of anticoagulants 07/18/2012   Outpatient Encounter Prescriptions as of 12/01/2013  Medication Sig  . Ascorbic Acid (VITAMIN C PO) Take 1 tablet by mouth daily.  Marland Kitchen aspirin EC 81 MG tablet Take 81 mg by mouth daily.  . carvedilol (COREG) 25 MG tablet Take 1 tablet (25 mg total) by mouth 2 (two) times daily with a meal.  . clobetasol cream (TEMOVATE) 0.05 % Apply 1 application topically 2 (two) times daily as needed (for eczema).  . furosemide (LASIX) 40 MG tablet TAKE ONE TABLET BY MOUTH EVERY DAY AS NEEDED FOR SWELLING  . lisinopril (PRINIVIL,ZESTRIL) 20 MG tablet TAKE ONE TABLET BY MOUTH TWICE DAILY  . Omega-3 Fatty Acids (FISH OIL) 1000 MG CAPS Take 1,000 mg by mouth 2 (two) times daily.  Marland Kitchen spironolactone (ALDACTONE) 25 MG tablet Take 1 tablet (25 mg total) by mouth daily.  Marland Kitchen warfarin (COUMADIN) 5 MG tablet Take 1 tablet daily or as directed by anticogulation clinic    Review of Systems  Constitutional: Negative.  Negative for fever.  HENT: Positive for congestion, postnasal drip and sinus pressure. Negative for sore throat.   Eyes: Negative.   Respiratory: Positive for cough.     Cardiovascular: Negative.   Gastrointestinal: Negative.   Endocrine: Negative.   Genitourinary: Negative.   Musculoskeletal: Negative.   Skin: Negative.   Allergic/Immunologic: Negative.   Neurological: Negative.  Negative for headaches.  Hematological: Negative.   Psychiatric/Behavioral: Negative.        Objective:   Physical Exam  Nursing note and vitals reviewed. Constitutional: He is oriented to person, place, and time. He appears well-developed and well-nourished. No distress.  Pleasant, overweight, alert  HENT:  Head: Normocephalic and atraumatic.  Right Ear: External ear normal.  Left Ear: External ear normal.  Mouth/Throat: Oropharynx is clear and moist. No oropharyngeal exudate.  Nasal congestion and turbinate swelling bilaterally left greater than right  Eyes: Conjunctivae and EOM are normal. Pupils are equal, round, and reactive to light. Right eye exhibits no discharge. Left eye exhibits no discharge. No scleral icterus.  Neck: Normal range of motion. Neck supple. No thyromegaly present.  Cardiovascular: Normal rate, regular rhythm and normal heart sounds.   No murmur heard. Pulmonary/Chest: Effort normal and breath sounds normal. No respiratory distress. He has no wheezes.  Musculoskeletal: Normal range of motion.  Lymphadenopathy:    He has cervical adenopathy (1 small lymph nodeat the left angle of the jaw).  Neurological: He is alert and oriented to person, place, and time.  Skin: Skin is warm and dry. No rash noted.  Psychiatric: He has a normal mood and affect. His  behavior is normal. Judgment and thought content normal.   BP 100/66  Pulse 66  Temp(Src) 97.8 F (36.6 C) (Oral)  Ht 6\' 3"  (1.905 m)  Wt 382 lb (173.274 kg)  BMI 47.75 kg/m2        Assessment & Plan:  1. Allergic rhinitis due to pollen - azelastine (ASTELIN) 0.1 % nasal spray; 2 sprays each nostril at bedtime  Dispense: 30 mL; Refill: 12 - fluticasone (FLONASE) 50 MCG/ACT nasal spray;  1-2 sprays each nostril at bedtime  Dispense: 16 g; Refill: 6  2. Paroxysmal atrial fibrillation  3. Long term (current) use of anticoagulants  Patient Instructions  Drink plenty of fluids Use saline nose spray during the day Use the prescription as sprays at nighttime before going to bed Mucinex maximum strength, blue and white in color, 1 twice daily with a large glass of water for cough and congestion   Nyra Capeson W. Sabri Teal MD

## 2013-12-01 NOTE — Patient Instructions (Signed)
Drink plenty of fluids Use saline nose spray during the day Use the prescription as sprays at nighttime before going to bed Mucinex maximum strength, blue and white in color, 1 twice daily with a large glass of water for cough and congestion

## 2013-12-04 ENCOUNTER — Encounter: Payer: Self-pay | Admitting: Cardiovascular Disease

## 2013-12-10 ENCOUNTER — Encounter: Payer: Self-pay | Admitting: Family Medicine

## 2013-12-10 ENCOUNTER — Ambulatory Visit (INDEPENDENT_AMBULATORY_CARE_PROVIDER_SITE_OTHER): Payer: Medicare Other | Admitting: Family Medicine

## 2013-12-10 VITALS — BP 128/70 | HR 64 | Temp 98.9°F | Ht 75.0 in | Wt 381.0 lb

## 2013-12-10 DIAGNOSIS — J069 Acute upper respiratory infection, unspecified: Secondary | ICD-10-CM

## 2013-12-10 DIAGNOSIS — R197 Diarrhea, unspecified: Secondary | ICD-10-CM

## 2013-12-10 MED ORDER — AZITHROMYCIN 250 MG PO TABS: ORAL_TABLET | ORAL | Status: DC

## 2013-12-10 MED ORDER — CHOLESTYRAMINE LIGHT 4 G PO PACK: 4.0000 g | PACK | Freq: Two times a day (BID) | ORAL | Status: DC

## 2013-12-11 NOTE — Progress Notes (Signed)
   Subjective:    Patient ID: Danny Hinton, male    DOB: 01-19-1981, 33 y.o.   MRN: [id number]  HPI This 33 y.o. male presents for evaluation of c/o diarrhea which is chronic after he eats and URI sx's..   Review of Systems C/o uri and diarrhea No chest pain, SOB, HA, dizziness, vision change, N/V, constipation, dysuria, urinary urgency or frequency, myalgias, arthralgias or rash.     Objective:   Physical Exam  Vital signs noted  Well developed well nourished male.  HEENT - Head atraumatic Normocephalic                Eyes - PERRLA, Conjuctiva - clear Sclera- Clear EOMI                Ears - EAC's Wnl TM's Wnl Gross Hearing WNL                Nose - Nares patent                 Throat - oropharanx wnl Respiratory - Lungs CTA bilateral Cardiac - RRR S1 and S2 without murmur GI - Abdomen soft Nontender and bowel sounds active x 4 Extremities - No edema. Neuro - Grossly intact.      Assessment & Plan:  Diarrhea - Plan: cholestyramine light (PREVALITE) 4 G packet  URI (upper respiratory infection) - Plan: azithromycin (ZITHROMAX) 250 MG tablet  Deatra Canter FNP

## 2013-12-27 ENCOUNTER — Emergency Department (HOSPITAL_COMMUNITY): Payer: Medicare Other

## 2013-12-27 ENCOUNTER — Encounter (HOSPITAL_COMMUNITY): Payer: Self-pay | Admitting: Emergency Medicine

## 2013-12-27 ENCOUNTER — Inpatient Hospital Stay (HOSPITAL_COMMUNITY)
Admission: EM | Admit: 2013-12-27 | Discharge: 2013-12-29 | DRG: 309 | Disposition: A | Discharge status: Home / Self Care | Payer: Medicare Other | Attending: Cardiology | Admitting: Cardiology

## 2013-12-27 DIAGNOSIS — Z6841 Body Mass Index (BMI) 40.0 and over, adult: Secondary | ICD-10-CM | POA: Diagnosis not present

## 2013-12-27 DIAGNOSIS — I959 Hypotension, unspecified: Secondary | ICD-10-CM | POA: Diagnosis present

## 2013-12-27 DIAGNOSIS — Z7901 Long term (current) use of anticoagulants: Secondary | ICD-10-CM

## 2013-12-27 DIAGNOSIS — E876 Hypokalemia: Secondary | ICD-10-CM | POA: Diagnosis present

## 2013-12-27 DIAGNOSIS — Z8249 Family history of ischemic heart disease and other diseases of the circulatory system: Secondary | ICD-10-CM

## 2013-12-27 DIAGNOSIS — I369 Nonrheumatic tricuspid valve disorder, unspecified: Secondary | ICD-10-CM

## 2013-12-27 DIAGNOSIS — I480 Paroxysmal atrial fibrillation: Secondary | ICD-10-CM | POA: Diagnosis present

## 2013-12-27 DIAGNOSIS — I509 Heart failure, unspecified: Secondary | ICD-10-CM | POA: Diagnosis present

## 2013-12-27 DIAGNOSIS — Z833 Family history of diabetes mellitus: Secondary | ICD-10-CM

## 2013-12-27 DIAGNOSIS — Z7982 Long term (current) use of aspirin: Secondary | ICD-10-CM

## 2013-12-27 DIAGNOSIS — I5042 Chronic combined systolic (congestive) and diastolic (congestive) heart failure: Secondary | ICD-10-CM | POA: Diagnosis present

## 2013-12-27 DIAGNOSIS — Z79899 Other long term (current) drug therapy: Secondary | ICD-10-CM | POA: Diagnosis not present

## 2013-12-27 DIAGNOSIS — I517 Cardiomegaly: Secondary | ICD-10-CM | POA: Diagnosis not present

## 2013-12-27 DIAGNOSIS — I4892 Unspecified atrial flutter: Secondary | ICD-10-CM | POA: Diagnosis present

## 2013-12-27 DIAGNOSIS — L259 Unspecified contact dermatitis, unspecified cause: Secondary | ICD-10-CM | POA: Diagnosis present

## 2013-12-27 DIAGNOSIS — F4310 Post-traumatic stress disorder, unspecified: Secondary | ICD-10-CM | POA: Diagnosis present

## 2013-12-27 DIAGNOSIS — I10 Essential (primary) hypertension: Secondary | ICD-10-CM

## 2013-12-27 DIAGNOSIS — I4891 Unspecified atrial fibrillation: Secondary | ICD-10-CM | POA: Diagnosis not present

## 2013-12-27 DIAGNOSIS — R002 Palpitations: Secondary | ICD-10-CM | POA: Diagnosis not present

## 2013-12-27 DIAGNOSIS — I5022 Chronic systolic (congestive) heart failure: Secondary | ICD-10-CM

## 2013-12-27 DIAGNOSIS — Z807 Family history of other malignant neoplasms of lymphoid, hematopoietic and related tissues: Secondary | ICD-10-CM | POA: Diagnosis not present

## 2013-12-27 DIAGNOSIS — R000 Tachycardia, unspecified: Secondary | ICD-10-CM | POA: Diagnosis not present

## 2013-12-27 DIAGNOSIS — Z95810 Presence of automatic (implantable) cardiac defibrillator: Secondary | ICD-10-CM | POA: Diagnosis present

## 2013-12-27 DIAGNOSIS — I428 Other cardiomyopathies: Secondary | ICD-10-CM | POA: Diagnosis present

## 2013-12-27 HISTORY — DX: Atrioventricular block, second degree: I44.1

## 2013-12-27 HISTORY — DX: Unspecified atrial flutter: I48.92

## 2013-12-27 HISTORY — DX: Morbid (severe) obesity due to excess calories: E66.01

## 2013-12-27 LAB — CBC WITH DIFFERENTIAL/PLATELET
BASOS ABS: 0 10*3/uL (ref 0.0–0.1)
BASOS PCT: 0 % (ref 0–1)
EOS PCT: 2 % (ref 0–5)
Eosinophils Absolute: 0.1 10*3/uL (ref 0.0–0.7)
HEMATOCRIT: 43.3 % (ref 39.0–52.0)
Hemoglobin: 14.3 g/dL (ref 13.0–17.0)
Lymphocytes Relative: 30 % (ref 12–46)
Lymphs Abs: 1.7 10*3/uL (ref 0.7–4.0)
MCH: 27.7 pg (ref 26.0–34.0)
MCHC: 33 g/dL (ref 30.0–36.0)
MCV: 83.9 fL (ref 78.0–100.0)
MONO ABS: 0.5 10*3/uL (ref 0.1–1.0)
Monocytes Relative: 8 % (ref 3–12)
NEUTROS ABS: 3.4 10*3/uL (ref 1.7–7.7)
Neutrophils Relative %: 60 % (ref 43–77)
Platelets: 283 10*3/uL (ref 150–400)
RBC: 5.16 MIL/uL (ref 4.22–5.81)
RDW: 14.3 % (ref 11.5–15.5)
WBC: 5.7 10*3/uL (ref 4.0–10.5)

## 2013-12-27 LAB — URINALYSIS, ROUTINE W REFLEX MICROSCOPIC
BILIRUBIN URINE: NEGATIVE
GLUCOSE, UA: NEGATIVE mg/dL
KETONES UR: NEGATIVE mg/dL
Leukocytes, UA: NEGATIVE
NITRITE: NEGATIVE
PH: 5 (ref 5.0–8.0)
Protein, ur: NEGATIVE mg/dL
SPECIFIC GRAVITY, URINE: 1.01 (ref 1.005–1.030)
Urobilinogen, UA: 0.2 mg/dL (ref 0.0–1.0)

## 2013-12-27 LAB — PROTIME-INR
INR: 1.96 — ABNORMAL HIGH (ref 0.00–1.49)
Prothrombin Time: 22.3 seconds — ABNORMAL HIGH (ref 11.6–15.2)

## 2013-12-27 LAB — COMPREHENSIVE METABOLIC PANEL
ALK PHOS: 50 U/L (ref 39–117)
ALT: 21 U/L (ref 0–53)
ANION GAP: 14 (ref 5–15)
AST: 17 U/L (ref 0–37)
Albumin: 4.1 g/dL (ref 3.5–5.2)
BILIRUBIN TOTAL: 0.7 mg/dL (ref 0.3–1.2)
BUN: 16 mg/dL (ref 6–23)
CO2: 24 mEq/L (ref 19–32)
Calcium: 9.4 mg/dL (ref 8.4–10.5)
Chloride: 99 mEq/L (ref 96–112)
Creatinine, Ser: 0.94 mg/dL (ref 0.50–1.35)
Glucose, Bld: 124 mg/dL — ABNORMAL HIGH (ref 70–99)
Potassium: 3.6 mEq/L — ABNORMAL LOW (ref 3.7–5.3)
Sodium: 137 mEq/L (ref 137–147)
TOTAL PROTEIN: 8.4 g/dL — AB (ref 6.0–8.3)

## 2013-12-27 LAB — URINE MICROSCOPIC-ADD ON

## 2013-12-27 LAB — TROPONIN I: Troponin I: <0.3 ng/mL (ref <0.30)

## 2013-12-27 LAB — TSH: TSH: 2.46 u[IU]/mL (ref 0.350–4.500)

## 2013-12-27 LAB — MRSA PCR SCREENING: MRSA by PCR: NEGATIVE

## 2013-12-27 LAB — PRO B NATRIURETIC PEPTIDE: Pro B Natriuretic peptide (BNP): 37.5 pg/mL (ref 0–125)

## 2013-12-27 LAB — MAGNESIUM: MAGNESIUM: 1.9 mg/dL (ref 1.5–2.5)

## 2013-12-27 IMAGING — CR DG CHEST 1V PORT
1 series · 1 of 1 positions shown · non-contrast
Comparison: 10/17/2012

CLINICAL DATA: Tachycardia

EXAM:
PORTABLE CHEST - 1 VIEW

[portable]
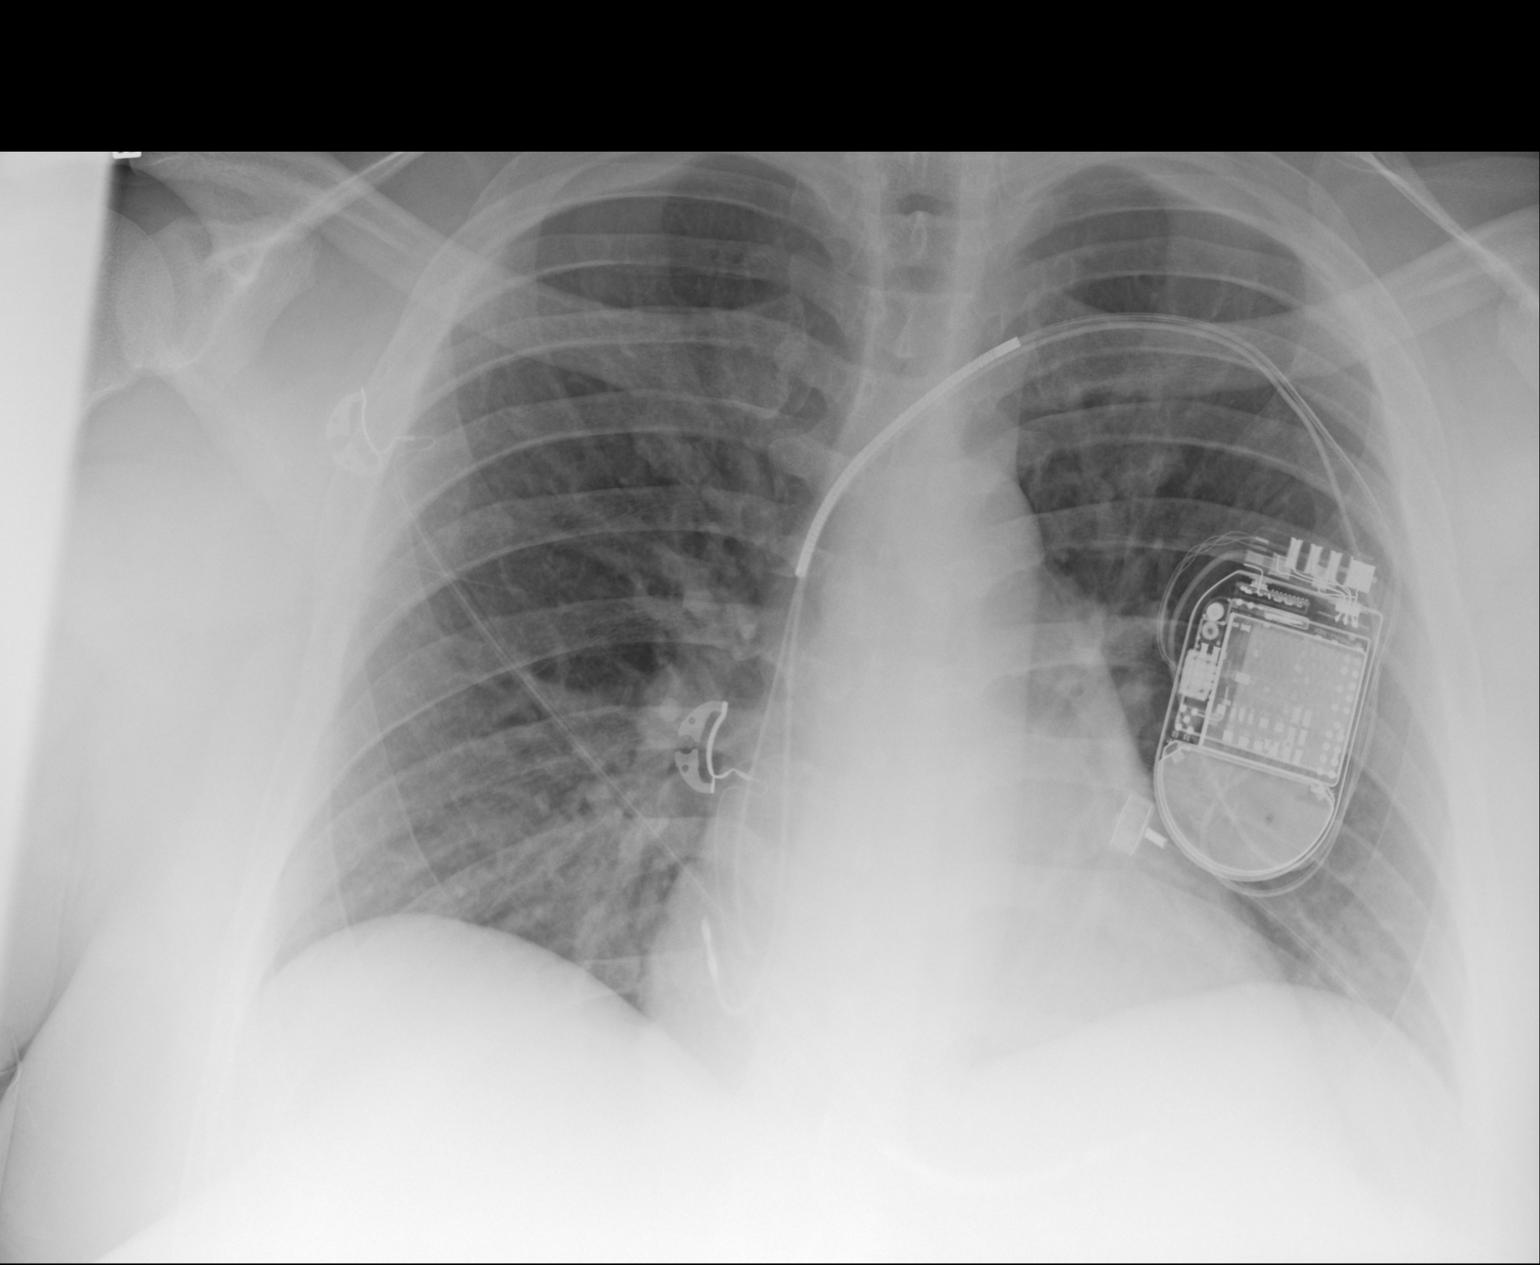

[1 of 1 positions shown; findings below may reference images not displayed]

FINDINGS: There is a dual-chamber pacer ICD from the left which has unchanged
lead orientation. Mild cardiomegaly which is stable from previous.
There is no edema, consolidation, effusion, or pneumothorax.
IMPRESSION: No active disease.

## 2013-12-27 MED ORDER — WARFARIN SODIUM 5 MG PO TABS
5.0000 mg | ORAL_TABLET | Freq: Once | ORAL | Status: AC
  Administered 2013-12-27: 5 mg via ORAL
  Filled 2013-12-27: qty 1

## 2013-12-27 MED ORDER — WARFARIN - PHARMACIST DOSING INPATIENT: Status: DC

## 2013-12-27 MED ORDER — SODIUM CHLORIDE 0.9 % IV SOLN
Freq: Once | INTRAVENOUS | Status: AC
  Administered 2013-12-27: 03:00:00 via INTRAVENOUS

## 2013-12-27 MED ORDER — SODIUM CHLORIDE 0.9 % IV SOLN
INTRAVENOUS | Status: DC
  Administered 2013-12-27: 05:00:00 via INTRAVENOUS

## 2013-12-27 MED ORDER — SODIUM CHLORIDE 0.9 % IV SOLN: INTRAVENOUS | Status: DC

## 2013-12-27 MED ORDER — SODIUM CHLORIDE 0.9 % IJ SOLN
3.0000 mL | Freq: Two times a day (BID) | INTRAMUSCULAR | Status: DC
  Administered 2013-12-27 – 2013-12-28 (×2): 3 mL via INTRAVENOUS

## 2013-12-27 MED ORDER — DILTIAZEM HCL 100 MG IV SOLR
2.5000 mg/h | Freq: Once | INTRAVENOUS | Status: AC
  Administered 2013-12-27: 2.5 mg/h via INTRAVENOUS
  Filled 2013-12-27: qty 100

## 2013-12-27 MED ORDER — DILTIAZEM HCL 25 MG/5ML IV SOLN
5.0000 mg | Freq: Once | INTRAVENOUS | Status: AC
  Administered 2013-12-27: 5 mg via INTRAVENOUS

## 2013-12-27 MED ORDER — SODIUM CHLORIDE 0.9 % IV SOLN
INTRAVENOUS | Status: DC
  Administered 2013-12-27: 07:00:00 via INTRAVENOUS
  Filled 2013-12-27: qty 1000

## 2013-12-27 MED ORDER — SODIUM CHLORIDE 0.9 % IJ SOLN: 3.0000 mL | INTRAMUSCULAR | Status: DC | PRN

## 2013-12-27 MED ORDER — SODIUM CHLORIDE 0.9 % IV SOLN: 250.0000 mL | INTRAVENOUS | Status: DC | PRN

## 2013-12-27 MED ORDER — ALUM & MAG HYDROXIDE-SIMETH 200-200-20 MG/5ML PO SUSP
15.0000 mL | ORAL | Status: DC | PRN
  Administered 2013-12-27: 15 mL via ORAL
  Filled 2013-12-27: qty 30

## 2013-12-27 MED ORDER — POTASSIUM CHLORIDE IN NACL 20-0.9 MEQ/L-% IV SOLN: INTRAVENOUS | Status: DC

## 2013-12-27 MED ORDER — SODIUM CHLORIDE 0.9 % IV BOLUS (SEPSIS)
500.0000 mL | Freq: Once | INTRAVENOUS | Status: AC
  Administered 2013-12-27: 500 mL via INTRAVENOUS

## 2013-12-27 MED ORDER — DEXTROSE 5 % IV SOLN
2.5000 mg/h | INTRAVENOUS | Status: DC
  Administered 2013-12-28: 2.5 mg/h via INTRAVENOUS

## 2013-12-27 MED ORDER — DILTIAZEM HCL 25 MG/5ML IV SOLN
5.0000 mg | Freq: Once | INTRAVENOUS | Status: AC
  Administered 2013-12-27: 5 mg via INTRAVENOUS
  Filled 2013-12-27: qty 5

## 2013-12-27 MED ORDER — ASPIRIN EC 81 MG PO TBEC
81.0000 mg | DELAYED_RELEASE_TABLET | Freq: Every day | ORAL | Status: DC
  Administered 2013-12-27: 81 mg via ORAL
  Filled 2013-12-27: qty 1

## 2013-12-27 MED ORDER — PNEUMOCOCCAL VAC POLYVALENT 25 MCG/0.5ML IJ INJ
0.5000 mL | INJECTION | INTRAMUSCULAR | Status: AC
  Administered 2013-12-28: 0.5 mL via INTRAMUSCULAR
  Filled 2013-12-27 (×2): qty 0.5

## 2013-12-27 MED ORDER — DILTIAZEM HCL 100 MG IV SOLR
5.0000 mg/h | INTRAVENOUS | Status: DC
  Filled 2013-12-27: qty 100

## 2013-12-27 MED ORDER — PANTOPRAZOLE SODIUM 40 MG PO TBEC
40.0000 mg | DELAYED_RELEASE_TABLET | Freq: Every day | ORAL | Status: DC
  Administered 2013-12-27 – 2013-12-29 (×3): 40 mg via ORAL
  Filled 2013-12-27 (×3): qty 1

## 2013-12-27 MED ORDER — POTASSIUM CHLORIDE CRYS ER 20 MEQ PO TBCR
40.0000 meq | EXTENDED_RELEASE_TABLET | Freq: Once | ORAL | Status: AC
  Administered 2013-12-27: 40 meq via ORAL
  Filled 2013-12-27: qty 2

## 2013-12-27 NOTE — Progress Notes (Signed)
Called report to [place], Charity fundraiser on 2H at [facility].  Verbalized understanding.  [facility] to transport patient to facility when truck arrives.  [place], Candelaria Stagers 12/27/2013

## 2013-12-27 NOTE — Consult Note (Signed)
Cardiology Consultation Note  Patient ID: Danny Hinton, MRN: [id number], DOB/AGE: July 17, 1980 33 y.o. Admit date: 12/27/2013   Date of Consult: 12/27/2013 Primary Physician: Rudi Heap, MD Primary Cardiologist: Dr. Royann Shivers  Chief Complaint: Palpitations Reason for Consult: Recurrence of atrial flutter  HPI: Danny Hinton is a 33 y/o M with morbid obesity, HTN, history of presumed NICM diagnosed in 2010 when he was admitted with rapid atrial flutter, and possible h/o PAF. He was 28 at the time when he presented to [facility] in 2010 c/o SOB and EF was found to be 10-20% by echo, 15% by TEE; he underwent DCCV at that time. UDS was negative then along with with normal TSH. He had second degree type 2 AV block during sleeping hours which has raised the suspicion of obstructive sleep apnea as a culprit given his body habitus. It was thought perhaps his cardiomyopathy was viral in etiology. Since that time he has received a dual-chamber St. Jude ICD in 2011 and per Dr. Erin Hearing notes has had recurrent paroxysmal atrial fibrillation as well. Last Echo 01/2010: mod dilated LV, EF 30%, mod aortic root dilitation, no significant valvular disease. He is anticoagulated with warfarin, INR 1.96.  He has been doing well recently with the exception of a day in July. The evening of 7/10 he had an ICD shock after missing Coreg/lisinopril for 2 days. I discussed his device interrogation with EP as notes were not clear to the arrhythmia treated; per their review this was likely an inappropriate device discharge for an atrial arrhythmia, possibly 1:1 flutter within his VF zone. His VF intervals were increased from 12->20 per Dr. Salena Saner at that time. The patient denies any significant symptoms surrounding that episode other than the shock itself.  Yesterday he spent some time out in the sun washing his car and sweating a lot, which was more activity than he's done lately. Around 10pm he noticed his heart was beating "funny" and checked his  pulse by his FitBit watch - it was ranging 105-1teens. He came to the ER for further eval and was found to be in atrial flutter with variable block. Diltiazem bolus 5mg  x 2 were given along with drip at 2.5mg /hr. His other meds have been held due to soft BP. He denies any CP, SOB, nausea, bleeding issues including BRBPR or hematuria. He reports compliance with meds recently. Denies orthopnea, weight changes, dyspnea.  Past Medical History  Diagnosis Date  . Chronic systolic congestive heart failure     a. suspected NICM - dx 2010. EF 15% by TEE, 10-20% by echo at that time. b. s/p St. Jude AICD 2011. c. Echo 01/2010: mod dilated LV, EF 30%, mod aortic root dilitation, no significant valvular disease.  Marland Kitchen Hypertension   . Eczema   . PAF (paroxysmal atrial fibrillation)   . Automatic implantable cardioverter-defibrillator in situ     a. St Jude in 2011.  Marland Kitchen Chronic anticoagulation   . Nonischemic cardiomyopathy   . Eczema   . Paroxysmal atrial flutter   . Mobitz type 2 second degree atrioventricular block     a. During sleeping hours in 2010 suspected due to ? OSA.      Most Recent Cardiac Studies: Echo 01/2010 as above Do not see hx of cath or nuc.   Surgical History:  Past Surgical History  Procedure Laterality Date  . Cardiac defibrillator placement  02/17/10    St. Jude Medical 45DR, model number [id number], serial number [id number]  . Tooth extraction N/A 10/23/2012  Procedure: EXTRACTION TEETH 1, 16, 17, 30, 31;  Surgeon: [name], DDS;  Location: [facility] OR;  Service: Oral Surgery;  Laterality: N/A;     Home Meds: Prior to Admission medications   Medication Sig Start Date End Date Taking? Authorizing Provider  Ascorbic Acid (VITAMIN C PO) Take 1 tablet by mouth daily.   Yes Historical Provider, MD  aspirin EC 81 MG tablet Take 81 mg by mouth daily.   Yes Historical Provider, MD  azelastine (ASTELIN) 0.1 % nasal spray 2 sprays each nostril at bedtime 12/01/13  Yes Ernestina Penna, MD   carvedilol (COREG) 25 MG tablet Take 1 tablet (25 mg total) by mouth 2 (two) times daily with a meal. 07/26/13  Yes Mihai Croitoru, MD  clobetasol cream (TEMOVATE) 0.05 % Apply 1 application topically 2 (two) times daily as needed (for eczema). 03/12/13  Yes Deatra Canter, FNP  fluticasone Aleda Grana) 50 MCG/ACT nasal spray 1-2 sprays each nostril at bedtime 12/01/13  Yes Ernestina Penna, MD  furosemide (LASIX) 40 MG tablet TAKE ONE TABLET BY MOUTH EVERY DAY AS NEEDED FOR SWELLING 10/10/13  Yes Mihai Croitoru, MD  lisinopril (PRINIVIL,ZESTRIL) 20 MG tablet TAKE ONE TABLET BY MOUTH TWICE DAILY 04/01/13  Yes Mihai Croitoru, MD  spironolactone (ALDACTONE) 25 MG tablet Take 1 tablet (25 mg total) by mouth daily. 05/14/13  Yes Mihai Croitoru, MD  warfarin (COUMADIN) 5 MG tablet Take 1 tablet daily or as directed by anticogulation clinic 11/19/13  Yes Tammy Eckard, PHARMD  azithromycin (ZITHROMAX) 250 MG tablet Take 2 po first day and then one po qd x 4 days 12/10/13   Deatra Canter, FNP  cholestyramine light (PREVALITE) 4 G packet Take 1 packet (4 g total) by mouth 2 (two) times daily. 12/10/13   Deatra Canter, FNP  Omega-3 Fatty Acids (FISH OIL) 1000 MG CAPS Take 1,000 mg by mouth 2 (two) times daily.    Historical Provider, MD    Inpatient Medications:  . aspirin EC  81 mg Oral Daily  . [START ON 12/28/2013] pneumococcal 23 valent vaccine  0.5 mL Intramuscular Tomorrow-1000  . sodium chloride  3 mL Intravenous Q12H  . Warfarin - Pharmacist Dosing Inpatient   Does not apply Q24H   . diltiazem (CARDIZEM) infusion      Allergies: No Known Allergies  History   Social History  . Marital Status: Married    Spouse Name: N/A    Number of Children: N/A  . Years of Education: N/A   Occupational History  . Not on file.   Social History Main Topics  . Smoking status: Never Smoker   . Smokeless tobacco: Never Used  . Alcohol Use: No  . Drug Use: No  . Sexual Activity: Yes   Other Topics Concern    . Not on file   Social History Narrative  . No narrative on file     Family History  Problem Relation Age of Onset  . Cancer Brother 18    hodgekins lymphoma  . Heart disease Maternal Grandfather   . Diabetes Paternal Grandmother   . Heart disease Mother     artial flutter     Review of Systems: General: negative for chills, fever, night sweats or weight changes.  Cardiovascular: see above Dermatological: negative for rash Respiratory: negative for cough or wheezing Urologic: negative for hematuria Abdominal: negative for nausea, vomiting, diarrhea, bright red blood per rectum, melena, or hematemesis Neurologic: negative for visual changes, syncope, or dizziness  All other systems reviewed and are otherwise negative except as noted above.  Labs:  Recent Labs  12/27/13 0141 12/27/13 0603  TROPONINI <0.30 <0.30   Lab Results  Component Value Date   WBC 5.7 12/27/2013   HGB 14.3 12/27/2013   HCT 43.3 12/27/2013   MCV 83.9 12/27/2013   PLT 283 12/27/2013    Recent Labs Lab 12/27/13 0141  NA 137  K 3.6*  CL 99  CO2 24  BUN 16  CREATININE 0.94  CALCIUM 9.4  PROT 8.4*  BILITOT 0.7  ALKPHOS 50  ALT 21  AST 17  GLUCOSE 124*      Radiology/Studies:  Dg Chest Portable 1 View  12/27/2013   CLINICAL DATA:  Tachycardia  EXAM: PORTABLE CHEST - 1 VIEW  COMPARISON:  10/17/2012  FINDINGS: There is a dual-chamber pacer ICD from the left which has unchanged lead orientation. Mild cardiomegaly which is stable from previous. There is no edema, consolidation, effusion, or pneumothorax.  IMPRESSION: No active disease.   Electronically Signed   By: Tiburcio Pea M.D.   On: 12/27/2013 02:19   EKG: 2:1 atrial flutter 113bpm, nnonspecific ST-T changes F/u this AM: atrial flutter with variable AV block, nonspecific ST-T changes  Physical Exam: Blood pressure 104/56, pulse 49, temperature 98.4 F (36.9 C), temperature source Oral, resp. rate 18, height 6\' 4"  (1.93 m), weight  379 lb 10.1 oz (172.2 kg), SpO2 99.00%. General: Well developed obese AAM, in no acute distress. Head: Normocephalic, atraumatic, sclera non-icteric, no xanthomas, nares are without discharge.  Neck: Negative for carotid bruits. JVD not elevated. Lungs: Clear bilaterally to auscultation without wheezes, rales, or rhonchi. Breathing is unlabored. Heart: Irregularly irregular, rate controlled, with S1 S2. No murmurs, rubs, or gallops appreciated. Abdomen: Soft, non-tender, non-distended with normoactive bowel sounds. No hepatomegaly. No rebound/guarding. No obvious abdominal masses. Msk:  Strength and tone appear normal for age. Extremities: No clubbing or cyanosis. No edema.  Distal pedal pulses are 2+ and equal bilaterally. Neuro: Alert and oriented X 3. No facial asymmetry. No focal deficit. Moves all extremities spontaneously. Psych:  Responds to questions appropriately with a normal affect.   Assessment and Plan:   1. Recurrent paroxysmal atrial flutter 2. Borderline hypotension 3. Recent ICD shock 11/2008 for ? 1:1 atrial flutter 4. Reported h/o PAF 5. Chronic systolic CHF due to suspected NICM - last EF 30% in 2011, echo pending 6. Hypokalemia 7. Morbid obesity Body mass index is 46.23 kg/(m^2). 8. Small Hgb in UA, will need to be repeated  2D echo pending. BP currently soft and prohibits med titration thus his usual HF medicines have been held. He is currently rate controlled and comfortable on IV diltiazem, however, this is not a good choice for him long-term with LV dysfunction. 2D echo results are pending. He currently appears euvolemic. Suspect he may need a sleep study for OSA if he has not already had one given h/o Mobitz 2 in 2010 and body habitus. Replete K. Do not think he needs both aspirin and Coumadin but will clarify with MD. If +Hgb in UA persists, will need to f/u PCP. Will discuss further plan with MD regarding EP evaluation.  Signed, Ronie Spies PA-C 12/27/2013, 9:20  AM   Attending note:  Patient seen and examined. Reviewed available records and discussed the case with Ms. Jetta Lout. I agree with her assessment. Danny Hinton presents with recurrent, symptomatic atrial flutter despite compliance with regular medical regimen for nonischemic cardiomyopathy. He also had a  device shock in July that was likely due to 1:1 atrial flutter in the VF zone. He is on chronic Coumadin, good dose of Coreg at 25 mg twice daily. History includes PAF, although there seems to be better documentation for atrial flutter over time. He has been placed on intravenous diltiazem for rate control, fairly effective, although blood pressure limits reinstitution of baseline medical regimen at this point. He is otherwise clinically stable without chest pain or breathlessness. Cardiac markers argue against ACS, pro-BNP is normal, and INR is 1.96. Chest x-ray without acute disease. ECG now shows rate-controlled typical atrial flutter with variable block.  I reviewed the situation with the patient and his multiple family members present. Recommendation is for transfer to our cardiology service at [facility] for EP consultation and management. At a minimum, antiarrhythmic therapy should be considered for suppression of atrial arrhythmias, although he might also be a good candidate for ablation. Followup echocardiogram should be obtained. For now we will continue current regimen pending evaluation by EP.  Jonelle SidleSamuel G. McDowell, M.D., F.A.C.C.

## 2013-12-27 NOTE — Progress Notes (Signed)
FYI it appears from the [facility] that the patient received two 500 cc boluses over 2 hours each. Will plan saline lock from this point forward given h/o CHF. Dayna Dunn PA-C

## 2013-12-27 NOTE — H&P (Signed)
PCP:   Rudi Heap, MD   Chief Complaint:  palpitations  HPI: 33 yo male h/o nicm ef 30%, chronic systolic chf, pafib on coumadin, AICD placement 2 years ago comes in with palpitations tonight.  No cp, no sob.  No le edema or swelling.  He worked outside in the heat all day tiddying up his yard and sweated a lot.  Did not really drink a lot of fluids either.  His aicd did not go off today, but he keeps on top of his bp and hr at home.  His sbp usually runs around 120.  His hr is always well controlled, but today he noted (he has bought a wrist monitor that he monitors his hr with) palpitations and his hr was staying at 115 so he came to the ED.  He has not urinated much today. His is compliant with his meds and never misses a dose.  Has not had an echo in 2 years, but at one point his ef was around 20-25%.  He has a scheduled appt with his cardiologist in  in October for routine yearly check up.  He is actively trying to loose weight and has lost 25 lbs so far in the last 6 months by dieting.  He was told his nicm was caused by a viral illness.  Review of Systems:  Positive and negative as per HPI otherwise all other systems are negative  Past Medical History: Past Medical History  Diagnosis Date  . Chronic systolic congestive heart failure   . Hypertension   . Eczema   . PAF (paroxysmal atrial fibrillation)   . Automatic implantable cardioverter-defibrillator in situ     St Jude  . Chronic anticoagulation   . Nonischemic cardiomyopathy     EF 30% 2011 echo  . Eczema    Past Surgical History  Procedure Laterality Date  . Cardiac defibrillator placement  02/17/10    St. Jude Medical 45DR, model number [id number], serial number [id number]  . Tooth extraction N/A 10/23/2012    Procedure: EXTRACTION TEETH 1, 16, 17, 30, 31;  Surgeon: [name], DDS;  Location: [facility] OR;  Service: Oral Surgery;  Laterality: N/A;    Medications: Prior to Admission medications   Medication  Sig Start Date End Date Taking? Authorizing Provider  Ascorbic Acid (VITAMIN C PO) Take 1 tablet by mouth daily.   Yes Historical Provider, MD  aspirin EC 81 MG tablet Take 81 mg by mouth daily.   Yes Historical Provider, MD  azelastine (ASTELIN) 0.1 % nasal spray 2 sprays each nostril at bedtime 12/01/13  Yes Ernestina Penna, MD  carvedilol (COREG) 25 MG tablet Take 1 tablet (25 mg total) by mouth 2 (two) times daily with a meal. 07/26/13  Yes Mihai Croitoru, MD  clobetasol cream (TEMOVATE) 0.05 % Apply 1 application topically 2 (two) times daily as needed (for eczema). 03/12/13  Yes Deatra Canter, FNP  fluticasone Aleda Grana) 50 MCG/ACT nasal spray 1-2 sprays each nostril at bedtime 12/01/13  Yes Ernestina Penna, MD  furosemide (LASIX) 40 MG tablet TAKE ONE TABLET BY MOUTH EVERY DAY AS NEEDED FOR SWELLING 10/10/13  Yes Mihai Croitoru, MD  lisinopril (PRINIVIL,ZESTRIL) 20 MG tablet TAKE ONE TABLET BY MOUTH TWICE DAILY 04/01/13  Yes Mihai Croitoru, MD  spironolactone (ALDACTONE) 25 MG tablet Take 1 tablet (25 mg total) by mouth daily. 05/14/13  Yes Mihai Croitoru, MD  warfarin (COUMADIN) 5 MG tablet Take 1 tablet daily or as directed by anticogulation  clinic 11/19/13  Yes Tammy Eckard, PHARMD  azithromycin (ZITHROMAX) 250 MG tablet Take 2 po first day and then one po qd x 4 days 12/10/13   Deatra CanterWilliam J Oxford, FNP  cholestyramine light (PREVALITE) 4 G packet Take 1 packet (4 g total) by mouth 2 (two) times daily. 12/10/13   Deatra CanterWilliam J Oxford, FNP  Omega-3 Fatty Acids (FISH OIL) 1000 MG CAPS Take 1,000 mg by mouth 2 (two) times daily.    Historical Provider, MD    Allergies:  No Known Allergies  Social History:  reports that he has never smoked. He has never used smokeless tobacco. He reports that he does not drink alcohol or use illicit drugs.  Family History: Family History  Problem Relation Age of Onset  . Cancer Brother 18    hodgekins lymphoma  . Heart disease Maternal Grandfather   . Diabetes Paternal  Grandmother   . Heart disease Mother     artial flutter    Physical Exam: Filed Vitals:   12/27/13 0345 12/27/13 0400 12/27/13 0415 12/27/13 0430  BP: 92/67 102/65 96/66 90/60   Pulse: 113 117 104 87  Temp:      TempSrc:      Resp: 17 22 16 21   Height:      Weight:      SpO2: 96% 96% 96% 95%   General appearance: alert, cooperative and no distress Head: Normocephalic, without obvious abnormality, atraumatic Eyes: negative Nose: Nares normal. Septum midline. Mucosa normal. No drainage or sinus tenderness. Neck: no JVD and supple, symmetrical, trachea midline Lungs: clear to auscultation bilaterally Heart: irregularly irregular rhythm Abdomen: soft, non-tender; bowel sounds normal; no masses,  no organomegaly Extremities: extremities normal, atraumatic, no cyanosis or edema Pulses: 2+ and symmetric Skin: Skin color, texture, turgor normal. No rashes or lesions Neurologic: Grossly normal    Labs on Admission:   Recent Labs  12/27/13 0141  NA 137  K 3.6*  CL 99  CO2 24  GLUCOSE 124*  BUN 16  CREATININE 0.94  CALCIUM 9.4    Recent Labs  12/27/13 0141  AST 17  ALT 21  ALKPHOS 50  BILITOT 0.7  PROT 8.4*  ALBUMIN 4.1    Recent Labs  12/27/13 0141  WBC 5.7  NEUTROABS 3.4  HGB 14.3  HCT 43.3  MCV 83.9  PLT 283    Recent Labs  12/27/13 0141  TROPONINI <0.30   Radiological Exams on Admission: Dg Chest Portable 1 View  12/27/2013   CLINICAL DATA:  Tachycardia  EXAM: PORTABLE CHEST - 1 VIEW  COMPARISON:  10/17/2012  FINDINGS: There is a dual-chamber pacer ICD from the left which has unchanged lead orientation. Mild cardiomegaly which is stable from previous. There is no edema, consolidation, effusion, or pneumothorax.  IMPRESSION: No active disease.   Electronically Signed   By: Tiburcio PeaJonathan  Watts M.D.   On: 12/27/2013 02:19    Assessment/Plan  33 yo male with nicm last ef 30%/aicd with palpitations/aflutter/afib  Principal Problem:   Paroxysmal  atrial fibrillation-  Place on cardizem gtt.  Has gotten 500 cc ivf bolus as bp is soft, getting another 500cc ns bolus.  Pt with compensated chf for now.  Cardiology consulted for am.  Have ordered echo to reevaluate his systolic function and complete serial cardiac enzymes.  Pharm consulted for coumadin management.  Place in stepdown.  Ck tsh and mag level also. Suspect this was provoked by strenous activity and excessive heat today.  Will provide some more gentle ivf  in addition to the liter he has already received for the next 12 hours.  Active Problems:  Stable unless o/w noted   Long term (current) use of anticoagulants   Chronic systolic congestive heart failure-  Compensated at this time,  Will need to restart his diuretics likely in the am or later today.   Non-ischemic cardiomyopathy   Automatic implantable cardioverter-defibrillator in situ- interrogation performed, no firing of device, aflutter.   Chronic anticoagulation   Atrial flutter with uncontrolled response  Most home meds held until reconciliation by pharm, could also increase his coreg as outpt, will await cardiology team recommendation.  Tiaria Biby A 12/27/2013, 5:06 AM

## 2013-12-27 NOTE — Progress Notes (Signed)
  Echocardiogram 2D Echocardiogram has been performed.  Danny Hinton 12/27/2013, 4:56 PM

## 2013-12-27 NOTE — ED Provider Notes (Signed)
CSN: [id number]     Arrival date & time 12/27/13  0125 History   First MD Initiated Contact with Patient 12/27/13 0150     Chief Complaint  Patient presents with  . Tachycardia     (Consider location/radiation/quality/duration/timing/severity/associated sxs/prior Treatment) HPI Comments: Patient complains of racing heart since about 10 PM. Denies any chest pain or shortness of breath. He has a history of nonischemic cardiomyopathy with AICD in place. His EF is about 30% by his report. He has proximal atrial fibrillation and is on Coumadin. Denies any recent medication changes. States compliance. Good by mouth intake and urine output. Endorses not drinking that much water today and working outside. Denies any dizziness or lightheadedness but states he just feels funny. Denies any abdominal pain, back pain, fever, chills or cough. Has not felt any AICD shocks.  The history is provided by the patient.    Past Medical History  Diagnosis Date  . Chronic systolic congestive heart failure   . Hypertension   . Eczema   . PAF (paroxysmal atrial fibrillation)   . Automatic implantable cardioverter-defibrillator in situ     St Jude  . Chronic anticoagulation   . Nonischemic cardiomyopathy     EF 30% 2011 echo  . Eczema    Past Surgical History  Procedure Laterality Date  . Cardiac defibrillator placement  02/17/10    [employer] 45DR, model number [id number], serial number [id number]  . Tooth extraction N/A 10/23/2012    Procedure: EXTRACTION TEETH 1, 16, 17, 30, 31;  Surgeon: [name], DDS;  Location: [facility] OR;  Service: Oral Surgery;  Laterality: N/A;   Family History  Problem Relation Age of Onset  . Cancer Brother 18    hodgekins lymphoma  . Heart disease Maternal Grandfather   . Diabetes Paternal Grandmother   . Heart disease Mother     artial flutter   History  Substance Use Topics  . Smoking status: Never Smoker   . Smokeless tobacco: Never Used  . Alcohol Use: No     Review of Systems  Constitutional: Positive for fatigue. Negative for fever, activity change and appetite change.  HENT: Negative for congestion and rhinorrhea.   Eyes: Negative for visual disturbance.  Respiratory: Negative for cough, chest tightness and shortness of breath.   Cardiovascular: Positive for palpitations. Negative for chest pain.  Gastrointestinal: Negative for nausea, vomiting and abdominal pain.  Genitourinary: Negative for dysuria and hematuria.  Musculoskeletal: Negative for arthralgias and myalgias.  Skin: Negative for wound.  Neurological: Positive for weakness. Negative for dizziness, light-headedness and headaches.  A complete 10 system review of systems was obtained and all systems are negative except as noted in the HPI and PMH.      Allergies  Review of patient's allergies indicates no known allergies.  Home Medications   Prior to Admission medications   Medication Sig Start Date End Date Taking? Authorizing Provider  Ascorbic Acid (VITAMIN C PO) Take 1 tablet by mouth daily.   Yes Historical Provider, MD  aspirin EC 81 MG tablet Take 81 mg by mouth daily.   Yes Historical Provider, MD  azelastine (ASTELIN) 0.1 % nasal spray 2 sprays each nostril at bedtime 12/01/13  Yes Ernestina Penna, MD  carvedilol (COREG) 25 MG tablet Take 1 tablet (25 mg total) by mouth 2 (two) times daily with a meal. 07/26/13  Yes Mihai Croitoru, MD  clobetasol cream (TEMOVATE) 0.05 % Apply 1 application topically 2 (two) times daily as needed (  for eczema). 03/12/13  Yes Deatra CanterWilliam J Oxford, FNP  fluticasone Aleda Grana(FLONASE) 50 MCG/ACT nasal spray 1-2 sprays each nostril at bedtime 12/01/13  Yes Ernestina Pennaonald W Moore, MD  furosemide (LASIX) 40 MG tablet TAKE ONE TABLET BY MOUTH EVERY DAY AS NEEDED FOR SWELLING 10/10/13  Yes Mihai Croitoru, MD  lisinopril (PRINIVIL,ZESTRIL) 20 MG tablet TAKE ONE TABLET BY MOUTH TWICE DAILY 04/01/13  Yes Mihai Croitoru, MD  spironolactone (ALDACTONE) 25 MG tablet Take 1  tablet (25 mg total) by mouth daily. 05/14/13  Yes Mihai Croitoru, MD  warfarin (COUMADIN) 5 MG tablet Take 1 tablet daily or as directed by anticogulation clinic 11/19/13  Yes Tammy Eckard, PHARMD  azithromycin (ZITHROMAX) 250 MG tablet Take 2 po first day and then one po qd x 4 days 12/10/13   Deatra CanterWilliam J Oxford, FNP  cholestyramine light (PREVALITE) 4 G packet Take 1 packet (4 g total) by mouth 2 (two) times daily. 12/10/13   Deatra CanterWilliam J Oxford, FNP  Omega-3 Fatty Acids (FISH OIL) 1000 MG CAPS Take 1,000 mg by mouth 2 (two) times daily.    Historical Provider, MD   BP 104/56  Pulse 49  Temp(Src) 98.4 F (36.9 C) (Oral)  Resp 18  Ht 6\' 4"  (1.93 m)  Wt 379 lb 10.1 oz (172.2 kg)  BMI 46.23 kg/m2  SpO2 99% Physical Exam  Nursing note and vitals reviewed. Constitutional: He is oriented to person, place, and time. He appears well-developed and well-nourished. No distress.  HENT:  Head: Normocephalic and atraumatic.  Mouth/Throat: Oropharynx is clear and moist. No oropharyngeal exudate.  Eyes: Conjunctivae and EOM are normal. Pupils are equal, round, and reactive to light.  Neck: Normal range of motion. Neck supple.  No meningismus.  Cardiovascular: Normal rate, regular rhythm, normal heart sounds and intact distal pulses.   No murmur heard. Regular tachycardia  Pulmonary/Chest: Effort normal and breath sounds normal. No respiratory distress.  Abdominal: Soft. There is no tenderness. There is no rebound and no guarding.  Musculoskeletal: Normal range of motion. He exhibits no edema and no tenderness.  Neurological: He is alert and oriented to person, place, and time. No cranial nerve deficit. He exhibits normal muscle tone. Coordination normal.  No ataxia on finger to nose bilaterally. No pronator drift. 5/5 strength throughout. CN 2-12 intact. Negative Romberg. Equal grip strength. Sensation intact. Gait is normal.   Skin: Skin is warm.  Psychiatric: He has a normal mood and affect. His behavior  is normal.    ED Course  Procedures (including critical care time) Labs Review Labs Reviewed  COMPREHENSIVE METABOLIC PANEL - Abnormal; Notable for the following:    Potassium 3.6 (*)    Glucose, Bld 124 (*)    Total Protein 8.4 (*)    All other components within normal limits  PROTIME-INR - Abnormal; Notable for the following:    Prothrombin Time 22.3 (*)    INR 1.96 (*)    All other components within normal limits  URINALYSIS, ROUTINE W REFLEX MICROSCOPIC - Abnormal; Notable for the following:    Hgb urine dipstick SMALL (*)    All other components within normal limits  MRSA PCR SCREENING  CBC WITH DIFFERENTIAL  TROPONIN I  PRO B NATRIURETIC PEPTIDE  URINE MICROSCOPIC-ADD ON  TROPONIN I  MAGNESIUM  TROPONIN I  TROPONIN I  TSH    Imaging Review Dg Chest Portable 1 View  12/27/2013   CLINICAL DATA:  Tachycardia  EXAM: PORTABLE CHEST - 1 VIEW  COMPARISON:  10/17/2012  FINDINGS: There is a dual-chamber pacer ICD from the left which has unchanged lead orientation. Mild cardiomegaly which is stable from previous. There is no edema, consolidation, effusion, or pneumothorax.  IMPRESSION: No active disease.   Electronically Signed   By: Tiburcio Pea M.D.   On: 12/27/2013 02:19     EKG Interpretation   Date/Time:  Thursday December 27 2013 01:44:16 EDT Ventricular Rate:  113 PR Interval:  183 QRS Duration: 92 QT Interval:  356 QTC Calculation: 488 R Axis:   -6 Text Interpretation:  2:1 atrial flutter ST depr, consider ischemia,  inferior leads Prolonged QT interval Confirmed by Manus Gunning  MD, America Sandall  ([id number]) on 12/27/2013 3:25:38 AM      MDM   Final diagnoses:  Atrial flutter with controlled response   Palpitations since 10 PM with history of nonischemic heart mouth daily and paroxysmal atrial fibrillation. No chest pain or shortness of breath.  EKG shows sinus tachycardia versus atrial flutter at 112.  Interrogation of the ICD shows atrial flutter for the past 4  hours. No episodes of ventricular tachycardia  IV Cardizem bolus given without change in heart rate. Cardizem drip will be initiated. INR subtherapeutic. BP remains soft in the 90s. Gentle fluids given.  Case discussed with on-call cardiology follow Dr. Tresa Endo. He feels patient's rate in the 110-120 range is acceptable even if he is in atrial flutter. It is not recommended aggressive rate control but states low-dose Cardizem is appropriate despite patient's low EF.  1 liter NS given.  Last EF 30%. CXR clear and lungs clear on exam. Suspect atrial flutter onset from dehydration and working outside today. Rate remains variable from 80s-115.  Admission d.w Dr. Onalee Hua.    CRITICAL CARE Performed by: Glynn Octave Total critical care time: 45 Critical care time was exclusive of separately billable procedures and treating other patients. Critical care was necessary to treat or prevent imminent or life-threatening deterioration. Critical care was time spent personally by me on the following activities: development of treatment plan with patient and/or surrogate as well as nursing, discussions with consultants, evaluation of patient's response to treatment, examination of patient, obtaining history from patient or surrogate, ordering and performing treatments and interventions, ordering and review of laboratory studies, ordering and review of radiographic studies, pulse oximetry and re-evaluation of patient's condition.   Glynn Octave, MD 12/27/13 [phone]

## 2013-12-27 NOTE — ED Notes (Signed)
Pt. Reports he was watching tv and felt like heart rate was elevated. Pt. Denies pain, denies sob.

## 2013-12-27 NOTE — Plan of Care (Signed)
Problem: Phase I Progression Outcomes Goal: Ventricular heart rate < 120/min Outcome: Completed/Met Date Met:  12/27/13 Patient on Cardizem gtt   Goal: Anticoagulation Therapy per MD order Outcome: Progressing Coumadin therapy Goal: Heart rate or rhythm control medication Outcome: Progressing HR 90-115 Goal: Pain controlled with appropriate interventions Outcome: Not Applicable Date Met:  07/20/79 No complaints of pain Goal: Initial discharge plan identified Outcome: [place] with spouse

## 2013-12-27 NOTE — Progress Notes (Signed)
  PROGRESS NOTE  Danny Hinton LTR:[id number] DOB: [birth year] DOA: 12/27/2013 PCP: Rudi Heap, MD  Summary: 33 year old male with history of nonischemic cardiomyopathy, LVEF 30%, paroxysmal atrial fibrillation maintained on warfarin, presented with palpitations and was found to have mild atrial fibrillation/flutter with rapid response. He reports compliance.  Assessment/Plan: 1. Paroxysmal atrial fibrillation with rapid ventricular response. On IV diltiazem. Likely triggered by strenuous activity in the heat and poor fluid intake. 2. NICM LVEF 30%, AICD in place 3. HTN   Heart rate predominantly 90s to 100 this morning. Cardiology consultation pending. Followup TSH and echocardiogram.  Code Status: full code DVT prophylaxis: warfarin Family Communication: Discussed with family at bedside Disposition Plan: home  Brendia Sacks, MD  [facility]  Pager [phone] If 7PM-7AM, please contact night-coverage at [email or web address], password [facility] 12/27/2013, 8:19 AM  LOS: 0 days   Consultants:  Cardiology   Procedures:    Antibiotics:    HPI/Subjective: Overall feels somewhat better. Less short of breath with exertion. No chest pain.  Objective: Filed Vitals:   12/27/13 0530 12/27/13 0534 12/27/13 0603 12/27/13 0700  BP:  97/65 90/69 104/56  Pulse: 114  101 49  Temp:   98.4 F (36.9 C)   TempSrc:   Oral   Resp: 23   18  Height:   6\' 4"  (1.93 m)   Weight:   172.2 kg (379 lb 10.1 oz)   SpO2: 98%  99% 99%    Intake/Output Summary (Last 24 hours) at 12/27/13 0819 Last data filed at 12/27/13 0328  Gross per 24 hour  Intake      0 ml  Output    350 ml  Net   -350 ml     Filed Weights   12/27/13 0142 12/27/13 0603  Weight: 171.913 kg (379 lb) 172.2 kg (379 lb 10.1 oz)    Exam:     Afebrile, heart rate 80-100s. Gen. Appears calm, comfortable.  Psych. Alert. Speech fluent and clear. Grossly normal mentation.  Eyes. Appears grossly  unremarkable  Cardiovascular. Irregular, tachycardic. No murmur, rub or gallop. No lower extremity edema. Telemetry irregular rhythm, suspect atrial flutter.  Respiratory. Clear to auscultation bilaterally. No wheezes, rales or rhonchi. Normal respiratory effort.  Data Reviewed:  Chemistry: Potassium 3.6. Complete metabolic panel otherwise unremarkable. Magnesium normal. Troponins negative thus far.  Heme: CBC within normal limits  Scheduled Meds: . aspirin EC  81 mg Oral Daily  . [START ON 12/28/2013] pneumococcal 23 valent vaccine  0.5 mL Intramuscular Tomorrow-1000  . sodium chloride  3 mL Intravenous Q12H  . Warfarin - Pharmacist Dosing Inpatient   Does not apply Q24H   Continuous Infusions: . 0.9 % NaCl with KCl 20 mEq / L    . diltiazem (CARDIZEM) infusion      Principal Problem:   Paroxysmal atrial fibrillation Active Problems:   Long term (current) use of anticoagulants   Chronic systolic congestive heart failure   Non-ischemic cardiomyopathy   Automatic implantable cardioverter-defibrillator in situ   Chronic anticoagulation   Atrial flutter with controlled response   Time spent 20 minutes

## 2013-12-27 NOTE — Progress Notes (Signed)
ANTICOAGULATION CONSULT NOTE - Initial Consult  Pharmacy Consult for Coumadin (chronic Rx PTA) Indication: atrial fibrillation  No Known Allergies  Patient Measurements: Height: 6\' 4"  (193 cm) Weight: 379 lb 10.1 oz (172.2 kg) IBW/kg (Calculated) : 86.8  Vital Signs: Temp: 98.4 F (36.9 C) (08/20 0730) Temp src: Oral (08/20 0730) BP: 104/56 mmHg (08/20 0700) Pulse Rate: 49 (08/20 0700)  Labs:  Recent Labs  12/27/13 0141 12/27/13 0603  HGB 14.3  --   HCT 43.3  --   PLT 283  --   LABPROT 22.3*  --   INR 1.96*  --   CREATININE 0.94  --   TROPONINI <0.30 <0.30   Estimated Creatinine Clearance: 193.1 ml/min (by C-G formula based on Cr of 0.94).  Medical History: Past Medical History  Diagnosis Date  . Chronic systolic congestive heart failure     a. suspected NICM - dx 2010. EF 15% by TEE, 10-20% by echo at that time. b. s/p St. Jude AICD 2011. c. Echo 01/2010: mod dilated LV, EF 30%, mod aortic root dilitation, no significant valvular disease.  Marland Kitchen Hypertension   . Eczema   . PAF (paroxysmal atrial fibrillation)   . Automatic implantable cardioverter-defibrillator in situ     a. St Jude in 2011.  Marland Kitchen Chronic anticoagulation   . Nonischemic cardiomyopathy   . Eczema   . Paroxysmal atrial flutter   . Mobitz type 2 second degree atrioventricular block     a. During sleeping hours in 2010 suspected due to ? OSA.  . Morbid obesity    Medications:  Prescriptions prior to admission  Medication Sig Dispense Refill  . Ascorbic Acid (VITAMIN C PO) Take 1 tablet by mouth daily.      Marland Kitchen aspirin EC 81 MG tablet Take 81 mg by mouth daily.      Marland Kitchen azelastine (ASTELIN) 0.1 % nasal spray 2 sprays each nostril at bedtime  30 mL  12  . carvedilol (COREG) 25 MG tablet Take 1 tablet (25 mg total) by mouth 2 (two) times daily with a meal.  180 tablet  1  . clobetasol cream (TEMOVATE) 0.05 % Apply 1 application topically 2 (two) times daily as needed (for eczema).  30 g  5  . fluticasone  (FLONASE) 50 MCG/ACT nasal spray 1-2 sprays each nostril at bedtime  16 g  6  . furosemide (LASIX) 40 MG tablet Take 40 mg by mouth daily as needed for fluid.      Marland Kitchen lisinopril (PRINIVIL,ZESTRIL) 20 MG tablet Take 20 mg by mouth 2 (two) times daily.      . Omega-3 Fatty Acids (FISH OIL) 1000 MG CAPS Take 1,000 mg by mouth 2 (two) times daily.      Marland Kitchen spironolactone (ALDACTONE) 25 MG tablet Take 1 tablet (25 mg total) by mouth daily.  30 tablet  5  . warfarin (COUMADIN) 5 MG tablet Take 5 mg by mouth every evening.       Assessment: 33yo male on chronic Coumadin PTA for h/o afib.  Home dose listed above.  INR is slightly below goal on admission.    Goal of Therapy:  INR 2-3 Monitor platelets by anticoagulation protocol: Yes   Plan:  Coumadin 5mg  po today x 1 dose (home regimen) INR daily  Margo Aye, Joyel Chenette A 12/27/2013,10:29 AM

## 2013-12-27 NOTE — Progress Notes (Signed)
[facility] here to transport patient to [facility]. [place], Candelaria Stagers 12/27/2013

## 2013-12-27 NOTE — Progress Notes (Signed)
UR chart review completed.  

## 2013-12-27 NOTE — Progress Notes (Signed)
Patient arrived in transfer from [facility].  On low dose IV diltiazem 2.5 mg/hr. Rhythm atrial flutter with controlled ventricular response. In no distress. Per Dr. Ival Bible consult/admission  note patient will need EP consult in am.

## 2013-12-27 NOTE — ED Notes (Signed)
Pt states approx 10 pm he noticed that his heart felt fast, states he has a defib/pacemaker and states he just felt funny for a while and it just has never subsided.  Pt denies pain, or sob, "just feel funny"

## 2013-12-27 NOTE — H&P (Signed)
Cardiology consultation note from today will serve as a history and physical.     Cardiology Consultation Note   Patient ID: [name] Haughey, MRN: [id number], DOB/AGE: March 16, 1981 33 y.o.  Admit date: 12/27/2013 Date of Consult: 12/27/2013  Primary Physician: Rudi HeapMOORE, DONALD, MD  Primary Cardiologist: Dr. Royann Shiversroitoru  Chief Complaint: Palpitations  Reason for Consult: Recurrence of atrial flutter   HPI: Mr. Rana SnareLowe is a 33 y/o M with morbid obesity, HTN, history of presumed NICM diagnosed in 2010 when he was admitted with rapid atrial flutter, and possible h/o PAF. He was 28 at the time when [facility] c/o SOB and EF was found to be 10-20% by echo, 15% by TEE; he underwent DCCV at that time. UDS was negative then along with with normal TSH. He had second degree type 2 AV block during sleeping hours which has raised the suspicion of obstructive sleep apnea as a culprit given his body habitus. It was thought perhaps his cardiomyopathy was viral in etiology. Since that time he has received a dual-chamber St. Jude ICD in 2011 and per Dr. Erin Hearingroitoru's notes has had recurrent paroxysmal atrial fibrillation as well. Last Echo 01/2010: mod dilated LV, EF 30%, mod aortic root dilitation, no significant valvular disease. He is anticoagulated with warfarin, INR 1.96.  He has been doing well recently with the exception of a day in July. The evening of 7/10 he had an ICD shock after missing Coreg/lisinopril for 2 days. I discussed his device interrogation with EP as notes were not clear to the arrhythmia treated; per their review this was likely an inappropriate device discharge for an atrial arrhythmia, possibly 1:1 flutter within his VF zone. His VF intervals were increased from 12->20 per Dr. Salena Saner at that time. The patient denies any significant symptoms surrounding that episode other than the shock itself.  Yesterday he spent some time out in the sun washing his car and sweating a lot, which was more activity than he's  done lately. Around 10pm he noticed his heart was beating "funny" and checked his pulse by his FitBit watch - it was ranging 105-1teens. He came to the ER for further eval and was found to be in atrial flutter with variable block. Diltiazem bolus 5mg  x 2 were given along with drip at 2.5mg /hr. His other meds have been held due to soft BP. He denies any CP, SOB, nausea, bleeding issues including BRBPR or hematuria. He reports compliance with meds recently. Denies orthopnea, weight changes, dyspnea.   Past Medical History   Diagnosis  Date   .  Chronic systolic congestive heart failure      a. suspected NICM - dx 2010. EF 15% by TEE, 10-20% by echo at that time. b. s/p St. Jude AICD 2011. c. Echo 01/2010: mod dilated LV, EF 30%, mod aortic root dilitation, no significant valvular disease.   Marland Kitchen.  Hypertension    .  Eczema    .  PAF (paroxysmal atrial fibrillation)    .  Automatic implantable cardioverter-defibrillator in situ      a. St Jude in 2011.   Marland Kitchen.  Chronic anticoagulation    .  Nonischemic cardiomyopathy    .  Eczema    .  Paroxysmal atrial flutter    .  Mobitz type 2 second degree atrioventricular block      a. During sleeping hours in 2010 suspected due to ? OSA.    Most Recent Cardiac Studies:  Echo 01/2010 as above  Do  not see hx of cath or nuc.   Surgical History:  Past Surgical History   Procedure  Laterality  Date   .  Cardiac defibrillator placement   02/17/10     St. Jude Medical 45DR, model number [id number], serial number [id number]   .  Tooth extraction  N/A  10/23/2012     Procedure: EXTRACTION TEETH 1, 16, 17, 30, 31; Surgeon: [name], DDS; Location: [facility] OR; Service: Oral Surgery; Laterality: N/A;    Home Meds:  Prior to Admission medications   Medication  Sig  Start Date  End Date  Taking?  Authorizing Provider   Ascorbic Acid (VITAMIN C PO)  Take 1 tablet by mouth daily.    Yes  Historical Provider, MD   aspirin EC 81 MG tablet  Take 81 mg by mouth daily.    Yes   Historical Provider, MD   azelastine (ASTELIN) 0.1 % nasal spray  2 sprays each nostril at bedtime  12/01/13   Yes  Ernestina Penna, MD   carvedilol (COREG) 25 MG tablet  Take 1 tablet (25 mg total) by mouth 2 (two) times daily with a meal.  07/26/13   Yes  Mihai Croitoru, MD   clobetasol cream (TEMOVATE) 0.05 %  Apply 1 application topically 2 (two) times daily as needed (for eczema).  03/12/13   Yes  Deatra Canter, FNP   fluticasone Aleda Grana) 50 MCG/ACT nasal spray  1-2 sprays each nostril at bedtime  12/01/13   Yes  Ernestina Penna, MD   furosemide (LASIX) 40 MG tablet  TAKE ONE TABLET BY MOUTH EVERY DAY AS NEEDED FOR SWELLING  10/10/13   Yes  Mihai Croitoru, MD   lisinopril (PRINIVIL,ZESTRIL) 20 MG tablet  TAKE ONE TABLET BY MOUTH TWICE DAILY  04/01/13   Yes  Mihai Croitoru, MD   spironolactone (ALDACTONE) 25 MG tablet  Take 1 tablet (25 mg total) by mouth daily.  05/14/13   Yes  Mihai Croitoru, MD   warfarin (COUMADIN) 5 MG tablet  Take 1 tablet daily or as directed by anticogulation clinic  11/19/13   Yes  Tammy Eckard, PHARMD   azithromycin (ZITHROMAX) 250 MG tablet  Take 2 po first day and then one po qd x 4 days  12/10/13    Deatra Canter, FNP   cholestyramine light (PREVALITE) 4 G packet  Take 1 packet (4 g total) by mouth 2 (two) times daily.  12/10/13    Deatra Canter, FNP   Omega-3 Fatty Acids (FISH OIL) 1000 MG CAPS  Take 1,000 mg by mouth 2 (two) times daily.     Historical Provider, MD   Inpatient Medications:  .  aspirin EC  81 mg  Oral  Daily   .  [START ON 12/28/2013] pneumococcal 23 valent vaccine  0.5 mL  Intramuscular  Tomorrow-1000   .  sodium chloride  3 mL  Intravenous  Q12H   .  Warfarin - Pharmacist Dosing Inpatient   Does not apply  Q24H    .  diltiazem (CARDIZEM) infusion     Allergies: No Known Allergies  History    Social History   .  Marital Status:  Married     Spouse Name:  N/A     Number of Children:  N/A   .  Years of Education:  N/A    Occupational History     .  Not on file.    Social History Main Topics   .  Smoking status:  Never Smoker   .  Smokeless tobacco:  Never Used   .  Alcohol Use:  No   .  Drug Use:  No   .  Sexual Activity:  Yes    Other Topics  Concern   .  Not on file    Social History Narrative   .  No narrative on file    Family History   Problem  Relation  Age of Onset   .  Cancer  Brother  18     hodgekins lymphoma   .  Heart disease  Maternal Grandfather    .  Diabetes  Paternal Grandmother    .  Heart disease  Mother      artial flutter    Review of Systems:  General: negative for chills, fever, night sweats or weight changes.  Cardiovascular: see above  Dermatological: negative for rash  Respiratory: negative for cough or wheezing  Urologic: negative for hematuria  Abdominal: negative for nausea, vomiting, diarrhea, bright red blood per rectum, melena, or hematemesis  Neurologic: negative for visual changes, syncope, or dizziness  All other systems reviewed and are otherwise negative except as noted above.  Labs:   Recent Labs   12/27/13 0141  12/27/13 0603   TROPONINI  <0.30  <0.30    Lab Results   Component  Value  Date    WBC  5.7  12/27/2013    HGB  14.3  12/27/2013    HCT  43.3  12/27/2013    MCV  83.9  12/27/2013    PLT  283  12/27/2013    Recent Labs  Lab  12/27/13 0141   NA  137   K  3.6*   CL  99   CO2  24   BUN  16   CREATININE  0.94   CALCIUM  9.4   PROT  8.4*   BILITOT  0.7   ALKPHOS  50   ALT  21   AST  17   GLUCOSE  124*    Radiology/Studies:  Dg Chest Portable 1 View  12/27/2013 CLINICAL DATA: Tachycardia EXAM: PORTABLE CHEST - 1 VIEW COMPARISON: 10/17/2012 FINDINGS: There is a dual-chamber pacer ICD from the left which has unchanged lead orientation. Mild cardiomegaly which is stable from previous. There is no edema, consolidation, effusion, or pneumothorax. IMPRESSION: No active disease. Electronically Signed By: Tiburcio Pea M.D. On: 12/27/2013 02:19   EKG: 2:1  atrial flutter 113bpm, nnonspecific ST-T changes  F/u this AM: atrial flutter with variable AV block, nonspecific ST-T changes   Physical Exam:  Blood pressure 104/56, pulse 49, temperature 98.4 F (36.9 C), temperature source Oral, resp. rate 18, height 6\' 4"  (1.93 m), weight 379 lb 10.1 oz (172.2 kg), SpO2 99.00%.  General: Well developed obese AAM, in no acute distress.  Head: Normocephalic, atraumatic, sclera non-icteric, no xanthomas, nares are without discharge.  Neck: Negative for carotid bruits. JVD not elevated.  Lungs: Clear bilaterally to auscultation without wheezes, rales, or rhonchi. Breathing is unlabored.  Heart: Irregularly irregular, rate controlled, with S1 S2. No murmurs, rubs, or gallops appreciated.  Abdomen: Soft, non-tender, non-distended with normoactive bowel sounds. No hepatomegaly. No rebound/guarding. No obvious abdominal masses.  Msk: Strength and tone appear normal for age.  Extremities: No clubbing or cyanosis. No edema. Distal pedal pulses are 2+ and equal bilaterally.  Neuro: Alert and oriented X 3. No facial asymmetry. No focal deficit. Moves all extremities spontaneously.  Psych: Responds to questions  appropriately with a normal affect.    Assessment and Plan:  1. Recurrent paroxysmal atrial flutter  2. Borderline hypotension  3. Recent ICD shock 11/2008 for ? 1:1 atrial flutter  4. Reported h/o PAF  5. Chronic systolic CHF due to suspected NICM - last EF 30% in 2011, echo pending  6. Hypokalemia  7. Morbid obesity Body mass index is 46.23 kg/(m^2).  8. Small Hgb in UA, will need to be repeated   2D echo pending. BP currently soft and prohibits med titration thus his usual HF medicines have been held. He is currently rate controlled and comfortable on IV diltiazem, however, this is not a good choice for him long-term with LV dysfunction. 2D echo results are pending. He currently appears euvolemic. Suspect he may need a sleep study for OSA if he has not  already had one given h/o Mobitz 2 in 2010 and body habitus. Replete K. Do not think he needs both aspirin and Coumadin but will clarify with MD. If +Hgb in UA persists, will need to f/u PCP. Will discuss further plan with MD regarding EP evaluation.   Signed,  Ronie Spies PA-C  12/27/2013, 9:20 AM   Attending note:  Patient seen and examined. Reviewed available records and discussed the case with Ms. Jetta Lout. I agree with her assessment. Mr. Dealba presents with recurrent, symptomatic atrial flutter despite compliance with regular medical regimen for nonischemic cardiomyopathy. He also had a device shock in July that was likely due to 1:1 atrial flutter in the VF zone. He is on chronic Coumadin, good dose of Coreg at 25 mg twice daily. History includes PAF, although there seems to be better documentation for atrial flutter over time. He has been placed on intravenous diltiazem for rate control, fairly effective, although blood pressure limits reinstitution of baseline medical regimen at this point. He is otherwise clinically stable without chest pain or breathlessness. Cardiac markers argue against ACS, pro-BNP is normal, and INR is 1.96. Chest x-ray without acute disease. ECG now shows rate-controlled typical atrial flutter with variable block.   I reviewed the situation with the patient and his multiple family members present. Recommendation is for transfer to our cardiology service at [facility] for EP consultation and management. At a minimum, antiarrhythmic therapy should be considered for suppression of atrial arrhythmias, although he might also be a good candidate for ablation. Followup echocardiogram should be obtained. For now we will continue current regimen pending evaluation by EP.   Jonelle Sidle, M.D., F.A.C.C.

## 2013-12-28 ENCOUNTER — Telehealth: Payer: Self-pay | Admitting: Cardiovascular Disease

## 2013-12-28 DIAGNOSIS — I4891 Unspecified atrial fibrillation: Secondary | ICD-10-CM | POA: Diagnosis not present

## 2013-12-28 LAB — BASIC METABOLIC PANEL
ANION GAP: 14 (ref 5–15)
Anion gap: 11 (ref 5–15)
BUN: 11 mg/dL (ref 6–23)
BUN: 12 mg/dL (ref 6–23)
CHLORIDE: 101 meq/L (ref 96–112)
CO2: 21 mEq/L (ref 19–32)
CO2: 21 meq/L (ref 19–32)
CREATININE: 0.86 mg/dL (ref 0.50–1.35)
Calcium: 9.3 mg/dL (ref 8.4–10.5)
Calcium: 9.1 mg/dL (ref 8.4–10.5)
Chloride: 103 mEq/L (ref 96–112)
Creatinine, Ser: 0.81 mg/dL (ref 0.50–1.35)
GFR calc Af Amer: >90 mL/min (ref >90)
GFR calc Af Amer: >90 mL/min (ref >90)
GFR calc non Af Amer: >90 mL/min (ref >90)
GFR calc non Af Amer: >90 mL/min (ref >90)
GLUCOSE: 111 mg/dL — AB (ref 70–99)
Glucose, Bld: 85 mg/dL (ref 70–99)
Potassium: 4 mEq/L (ref 3.7–5.3)
Potassium: 4 mEq/L (ref 3.7–5.3)
Sodium: 135 mEq/L — ABNORMAL LOW (ref 137–147)
Sodium: 136 mEq/L — ABNORMAL LOW (ref 137–147)

## 2013-12-28 LAB — CBC
HCT: 42.3 % (ref 39.0–52.0)
Hemoglobin: 13.5 g/dL (ref 13.0–17.0)
MCH: 27.7 pg (ref 26.0–34.0)
MCHC: 31.9 g/dL (ref 30.0–36.0)
MCV: 86.9 fL (ref 78.0–100.0)
Platelets: 259 10*3/uL (ref 150–400)
RBC: 4.87 MIL/uL (ref 4.22–5.81)
RDW: 14.7 % (ref 11.5–15.5)
WBC: 4.1 10*3/uL (ref 4.0–10.5)

## 2013-12-28 LAB — URINALYSIS, ROUTINE W REFLEX MICROSCOPIC
Bilirubin Urine: NEGATIVE
Glucose, UA: NEGATIVE mg/dL
Ketones, ur: NEGATIVE mg/dL
Leukocytes, UA: NEGATIVE
Nitrite: NEGATIVE
Protein, ur: NEGATIVE mg/dL
Specific Gravity, Urine: 1.032 — ABNORMAL HIGH (ref 1.005–1.030)
Urobilinogen, UA: 0.2 mg/dL (ref 0.0–1.0)
pH: 6 (ref 5.0–8.0)

## 2013-12-28 LAB — PROTIME-INR
INR: 2.09 — ABNORMAL HIGH (ref 0.00–1.49)
INR: 2.14 — AB (ref 0.00–1.49)
Prothrombin Time: 23.5 seconds — ABNORMAL HIGH (ref 11.6–15.2)
Prothrombin Time: 23.9 seconds — ABNORMAL HIGH (ref 11.6–15.2)

## 2013-12-28 LAB — URINE MICROSCOPIC-ADD ON

## 2013-12-28 MED ORDER — WARFARIN SODIUM 5 MG PO TABS
5.0000 mg | ORAL_TABLET | Freq: Once | ORAL | Status: AC
  Administered 2013-12-28: 5 mg via ORAL
  Filled 2013-12-28: qty 1

## 2013-12-28 MED ORDER — SODIUM CHLORIDE 0.9 % IV SOLN
INTRAVENOUS | Status: DC
  Administered 2013-12-29: 06:00:00 via INTRAVENOUS

## 2013-12-28 MED ORDER — SODIUM CHLORIDE 0.9 % IJ SOLN
3.0000 mL | Freq: Two times a day (BID) | INTRAMUSCULAR | Status: DC
  Administered 2013-12-28: 3 mL via INTRAVENOUS

## 2013-12-28 MED ORDER — SODIUM CHLORIDE 0.9 % IJ SOLN: 3.0000 mL | INTRAMUSCULAR | Status: DC | PRN

## 2013-12-28 MED ORDER — CARVEDILOL 12.5 MG PO TABS
12.5000 mg | ORAL_TABLET | Freq: Two times a day (BID) | ORAL | Status: DC
  Administered 2013-12-29: 12.5 mg via ORAL
  Filled 2013-12-28 (×4): qty 1

## 2013-12-28 MED ORDER — SODIUM CHLORIDE 0.9 % IV SOLN: 250.0000 mL | INTRAVENOUS | Status: DC

## 2013-12-28 MED ORDER — DIGOXIN 125 MCG PO TABS
0.1250 mg | ORAL_TABLET | Freq: Every day | ORAL | Status: DC
  Administered 2013-12-28 – 2013-12-29 (×2): 0.125 mg via ORAL
  Filled 2013-12-28 (×2): qty 1

## 2013-12-28 NOTE — Telephone Encounter (Signed)
Pt wanted you to know he was admitted to [facility] for atrial Fib.Please be sure to let Dr Salena Saner know,he would love to talk to him.

## 2013-12-28 NOTE — Telephone Encounter (Signed)
Will make dr c aware 

## 2013-12-28 NOTE — Progress Notes (Signed)
Patient appears anxious in regards to hospital stay and current condition. Stated he did not want to move very much in order to prevent his heart rate from rising. He does not want to be shocked by his ICD. Patient states his rate goes over 100 when standing. Explained to patient that is a normal response with movement. Patient focused on the readings from his bedside monitor and has made calls to the nurse station whenever it alarms. Patient monitor settings have been changed to avoid frequent alarms and provide less anxiety for the patient.

## 2013-12-28 NOTE — Progress Notes (Signed)
ANTICOAGULATION CONSULT NOTE - Follow Up Consult  Pharmacy Consult for Coumadin Indication: atrial fibrillation  No Known Allergies  Patient Measurements: Height: 6\' 4"  (193 cm) Weight: 377 lb 10.4 oz (171.3 kg) IBW/kg (Calculated) : 86.8  Vital Signs: Temp: 97.7 F (36.5 C) (08/21 0700) Temp src: Oral (08/21 0700) BP: 92/47 mmHg (08/21 0700) Pulse Rate: 74 (08/21 0700)  Labs:  Recent Labs  12/27/13 0141 12/27/13 0603 12/27/13 1157 12/28/13 0605  HGB 14.3  --   --  13.5  HCT 43.3  --   --  42.3  PLT 283  --   --  259  LABPROT 22.3*  --   --  23.5*  INR 1.96*  --   --  2.09*  CREATININE 0.94  --   --  0.81  TROPONINI <0.30 <0.30 <0.30  --     Estimated Creatinine Clearance: 223.3 ml/min (by C-G formula based on Cr of 0.81).  Assessment: 33yom admitted with recurrent aflutter continues on his home coumadin. INR is therapeutic on home dose of 5mg  daily. He is awaiting EP consultation. CBC stable. No bleeding reported.  Goal of Therapy:  INR 2-3 Monitor platelets by anticoagulation protocol: Yes   Plan:  1) Coumadin 5mg  x 1 tonight 2) INR in AM  Fredrik Rigger 12/28/2013,8:41 AM

## 2013-12-28 NOTE — Consult Note (Signed)
  ELECTROPHYSIOLOGY CONSULT NOTE  Patient ID: Danny Hinton, MRN: [id number], DOB/AGE: 01/13/1981 33 y.o. Admit date: 12/27/2013 Date of Consult: 12/28/2013  Primary Physician: MOORE, DONALD, MD Primary Cardiologist: mc  Chief Complaint: ATRIAL FLUTTER   HPI Danny Hinton is a 33 y.o. male  Seen in consultation for atrial flutter.  He was admitted yesterday because of palpitations associated with shortness of breath and lightheadedness. He was found to be in atrial flutter at that hospital and transferred to [place].  He has a history of atrial flutter. He initially presented in 2010 with atrial flutter. Ejection fraction at that time was 10-20% he underwent cardioversion. He apparently has maintained sinus rhythm until early July 2015 he developed similar symptoms as noted above and in received an ICD shock.  Device interrogation demonstrated that there was one-to-one atrial flutter and shock. He had not been taking his medications. His device was reprogrammed to increase his detection rate. This resolved his symptoms and treated for himself a significant amount of anxiety related to the possibility of ICD shock.  Interrogation of his device also demonstrated that it had 2 episodes in the last month along (greater than 24 hours) atrial fibrillation; maximum heart rate from 120s. He was unaware of both of these episodes.  At baseline he has relatively good exercise tolerance. He denies peripheral edema or nocturnal dyspnea.  He's had a recent sleep study that was unrevealing.  Echocardiogram done this hospitalization demonstrated ejection fraction 20-25% with moderate aortic root dilatation the right ventricle is also mildly to moderately dilated as is the right atrium.  Past Medical History  Diagnosis Date  . Chronic systolic congestive heart failure     a. suspected NICM - dx 2010. EF 15% by TEE, 10-20% by echo at that time. b. s/p St. Jude AICD 2011. c. Echo 01/2010: mod dilated LV, EF  30%, mod aortic root dilitation, no significant valvular disease.  . Hypertension   . Eczema   . PAF (paroxysmal atrial fibrillation)   . Automatic implantable cardioverter-defibrillator in situ     a. St Jude in 2011.  . Chronic anticoagulation   . Nonischemic cardiomyopathy   . Eczema   . Paroxysmal atrial flutter   . Mobitz type 2 second degree atrioventricular block     a. During sleeping hours in 2010 suspected due to ? OSA.  . Morbid obesity       Surgical History:  Past Surgical History  Procedure Laterality Date  . Cardiac defibrillator placement  02/17/10    St. Jude Medical 45DR, model number [id number], serial number [id number]  . Tooth extraction N/A 10/23/2012    Procedure: EXTRACTION TEETH 1, 16, 17, 30, 31;  Surgeon: Scott M Jensen, DDS;  Location: [facility] OR;  Service: Oral Surgery;  Laterality: N/A;     Home Meds: Prior to Admission medications   Medication Sig Start Date End Date Taking? Authorizing Provider  Ascorbic Acid (VITAMIN C PO) Take 1 tablet by mouth daily.   Yes Historical Provider, MD  aspirin EC 81 MG tablet Take 81 mg by mouth daily.   Yes Historical Provider, MD  azelastine (ASTELIN) 0.1 % nasal spray 2 sprays each nostril at bedtime 12/01/13  Yes Donald W Moore, MD  carvedilol (COREG) 25 MG tablet Take 1 tablet (25 mg total) by mouth 2 (two) times daily with a meal. 07/26/13  Yes Mihai Croitoru, MD  clobetasol cream (TEMOVATE) 0.05 % Apply 1 application topically 2 (two) times daily as needed (for eczema).   03/12/13  Yes Deatra Canter, FNP  fluticasone Aleda Grana) 50 MCG/ACT nasal spray 1-2 sprays each nostril at bedtime 12/01/13  Yes Ernestina Penna, MD  furosemide (LASIX) 40 MG tablet Take 40 mg by mouth daily as needed for fluid.   Yes Historical Provider, MD  lisinopril (PRINIVIL,ZESTRIL) 20 MG tablet Take 20 mg by mouth 2 (two) times daily.   Yes Historical Provider, MD  Omega-3 Fatty Acids (FISH OIL) 1000 MG CAPS Take 1,000 mg by mouth 2 (two) times daily.    Yes Historical Provider, MD  spironolactone (ALDACTONE) 25 MG tablet Take 1 tablet (25 mg total) by mouth daily. 05/14/13  Yes Mihai Croitoru, MD  warfarin (COUMADIN) 5 MG tablet Take 5 mg by mouth every evening.   Yes Historical Provider, MD    Inpatient Medications:  . pantoprazole  40 mg Oral Daily  . sodium chloride  3 mL Intravenous Q12H  . warfarin  5 mg Oral ONCE-1800  . Warfarin - Pharmacist Dosing Inpatient   Does not apply Q24H    Allergies: No Known Allergies  History   Social History  . Marital Status: Married    Spouse Name: N/A    Number of Children: N/A  . Years of Education: N/A   Occupational History  . Not on file.   Social History Main Topics  . Smoking status: Never Smoker   . Smokeless tobacco: Never Used  . Alcohol Use: No  . Drug Use: No  . Sexual Activity: Yes   Other Topics Concern  . Not on file   Social History Narrative  . No narrative on file     Family History  Problem Relation Age of Onset  . Cancer Brother 18    hodgekins lymphoma  . Heart disease Maternal Grandfather   . Diabetes Paternal Grandmother   . Heart disease Mother     artial flutter     ROS:  Please see the history of present illness.     All other systems reviewed and negative.    Physical Exam: Blood pressure 89/54, pulse 78, temperature 97.5 F (36.4 C), temperature source Oral, resp. rate 18, height 6\' 4"  (1.93 m), weight 377 lb 10.4 oz (171.3 kg), SpO2 100.00%. General: Well developed, morbidly obese age appearing African American male in no acute distress. Head: Normocephalic, atraumatic, sclera non-icteric, no xanthomas, nares are without discharge. EENT: normal Lymph Nodes:  none Back: without scoliosis/kyphosis  no CVA tendersness Neck: Negative for carotid bruits. JVD not . Lu discerniblengs: Clear bilaterally to auscultation without wheezes, rales, or rhonchi. Breathing is unlabored. Heart: Irregularly irregular rate and rhythm without significant  murmur , rubs, or gallops appreciated. Abdomen: Soft, non-tender, non-distended with normoactive bowel sounds. No hepatomegaly. No rebound/guarding. No obvious abdominal masses. Msk:  Strength and tone appear normal for age. Extremities: No clubbing or cyanosis. No * edema.  Distal pedal pulses are 2+ and equal bilaterally. Skin: Warm and Dry Neuro: Alert and oriented X 3. CN III-XII intact Grossly normal sensory and motor function . Psych:  Responds to questions appropriately with a normal affect.      Labs: Cardiac Enzymes  Recent Labs  12/27/13 0141 12/27/13 0603 12/27/13 1157  TROPONINI <0.30 <0.30 <0.30   CBC Lab Results  Component Value Date   WBC 4.1 12/28/2013   HGB 13.5 12/28/2013   HCT 42.3 12/28/2013   MCV 86.9 12/28/2013   PLT 259 12/28/2013   PROTIME:  Recent Labs  12/27/13 0141 12/28/13 0605  LABPROT  22.3* 23.5*  INR 1.96* 2.09*   Chemistry  Recent Labs Lab 12/27/13 0141 12/28/13 0605  NA 137 135*  K 3.6* 4.0  CL 99 103  CO2 24 21  BUN 16 11  CREATININE 0.94 0.81  CALCIUM 9.4 9.3  PROT 8.4*  --   BILITOT 0.7  --   ALKPHOS 50  --   ALT 21  --   AST 17  --   GLUCOSE 124* 111*   Lipids Lab Results  Component Value Date   CHOL  Value: 170        ATP III CLASSIFICATION:  <200     mg/dL   Desirable  [id number]  mg/dL   Borderline High  >=130>=240    mg/dL   High        86/57/846911/03/2009   HDL 32* 03/20/2009   LDLCALC  Value: 116        Total Cholesterol/HDL:CHD Risk Coronary Heart Disease Risk Table                     Men   Women  1/2 Average Risk   3.4   3.3  Average Risk       5.0   4.4  2 X Average Risk   9.6   7.1  3 X Average Risk  23.4   11.0        Use the calculated Patient Ratio above and the CHD Risk Table to determine the patient's CHD Risk.        ATP III CLASSIFICATION (LDL):  <100     mg/dL   Optimal  [id number]  mg/dL   Near or Above                    Optimal  130-159  mg/dL   Borderline  [id number]  mg/dL   High  >010>190     mg/dL   Very High* 27/25/366411/03/2009    TRIG 110 03/20/2009   BNP Pro B Natriuretic peptide (BNP)  Date/Time Value Ref Range Status  12/27/2013  1:41 AM 37.5  0 - 125 pg/mL Final  03/24/2009 12:09 PM 107.0* 0.0 - 100.0 pg/mL Final  03/24/2009  3:20 AM 129.0* 0.0 - 100.0 pg/mL Final  03/22/2009  7:06 PM 185.0* 0.0 - 100.0 pg/mL Final   Miscellaneous Lab Results  Component Value Date   DDIMER  Value: 1.85        AT THE INHOUSE ESTABLISHED CUTOFF VALUE OF 0.48 ug/mL FEU, THIS ASSAY HAS BEEN DOCUMENTED IN THE LITERATURE TO HAVE A SENSITIVITY AND NEGATIVE PREDICTIVE VALUE OF AT LEAST 98 TO 99%.  THE TEST RESULT SHOULD BE CORRELATED WITH AN ASSESSMENT OF THE CLINICAL PROBABILITY OF DVT / VTE.* 03/19/2009    Radiology/Studies:  Dg Chest Portable 1 View  12/27/2013   CLINICAL DATA:  Tachycardia  EXAM: PORTABLE CHEST - 1 VIEW  COMPARISON:  10/17/2012  FINDINGS: There is a dual-chamber pacer ICD from the left which has unchanged lead orientation. Mild cardiomegaly which is stable from previous. There is no edema, consolidation, effusion, or pneumothorax.  IMPRESSION: No active disease.   Electronically Signed   By: Tiburcio PeaJonathan  Watts M.D.   On: 12/27/2013 02:19    EKG: *atrial flutter typical with AA rate of 250   Assessment and Plan:  Atrial flutter/atrial fibrillation  Nonischemic cardiomyopathy  Congestive heart failure-chronic-systolic  Morbid obesity  Hypotension   Implantable defibrillator-dual-chamber-St. Jude  PTSD secondary to ICD shock-inappropriate  The patient was admitted  with atrial flutter and variable conduction with some one-to-one noted. It was associated with symptoms of dyspnea or so with the recollection of prior ICD shock for similar symptoms.  Review of his defibrillator demonstrates recurrent episodes also prolonged atrial fibrillation associated with significantly slower heart rates however. And strategies include ongoing anticoagulation and antiarrhythmic therapy for his atrial arrhythmia.  However,  given the fact that he has had inappropriate shocks for one-to-one atrial flutter I think it is reasonable to undertake ablation of his atrial flutter substrate realizing that he will be left with his atrial fibrillation problem but that this has been associated with significantly slower heart rates. The second will have a profound impact on his PTSD.  Hypotension has resulted in the holding of his outpatient heart failure medications   WE will stop his IV diltiazem and resume his beta blockers.  I will add digoxin. Will anticipate resuming his ACE inhibitors  Given his size, this will need to be undertaken with anesthesia support. Will anticipate DC cardioversion in the morning with subsequent discharge and outpatient scheduling  Steven Klein   

## 2013-12-28 NOTE — Telephone Encounter (Signed)
Thanks

## 2013-12-29 ENCOUNTER — Inpatient Hospital Stay (HOSPITAL_COMMUNITY): Payer: Medicare Other | Admitting: Anesthesiology

## 2013-12-29 ENCOUNTER — Encounter (HOSPITAL_COMMUNITY): Payer: Self-pay | Admitting: Anesthesiology

## 2013-12-29 ENCOUNTER — Encounter (HOSPITAL_COMMUNITY)
Admission: EM | Disposition: A | Discharge status: Home / Self Care | Payer: Self-pay | Source: Home / Self Care | Attending: Cardiology

## 2013-12-29 ENCOUNTER — Encounter (HOSPITAL_COMMUNITY): Payer: Medicare Other | Admitting: Anesthesiology

## 2013-12-29 HISTORY — PX: CARDIOVERSION: SHX1299

## 2013-12-29 LAB — BASIC METABOLIC PANEL
Anion gap: 14 (ref 5–15)
BUN: 13 mg/dL (ref 6–23)
CO2: 20 meq/L (ref 19–32)
Calcium: 8.9 mg/dL (ref 8.4–10.5)
Chloride: 103 mEq/L (ref 96–112)
Creatinine, Ser: 0.89 mg/dL (ref 0.50–1.35)
GFR calc Af Amer: >90 mL/min (ref >90)
GLUCOSE: 118 mg/dL — AB (ref 70–99)
POTASSIUM: 4.3 meq/L (ref 3.7–5.3)
Sodium: 137 mEq/L (ref 137–147)

## 2013-12-29 LAB — CBC
HCT: 42.5 % (ref 39.0–52.0)
Hemoglobin: 13.7 g/dL (ref 13.0–17.0)
MCH: 27.1 pg (ref 26.0–34.0)
MCHC: 32.2 g/dL (ref 30.0–36.0)
MCV: 84 fL (ref 78.0–100.0)
Platelets: 243 10*3/uL (ref 150–400)
RBC: 5.06 MIL/uL (ref 4.22–5.81)
RDW: 14.4 % (ref 11.5–15.5)
WBC: 4.6 10*3/uL (ref 4.0–10.5)

## 2013-12-29 LAB — PROTIME-INR
INR: 2.52 — ABNORMAL HIGH (ref 0.00–1.49)
PROTHROMBIN TIME: 27.2 s — AB (ref 11.6–15.2)

## 2013-12-29 SURGERY — CARDIOVERSION
Anesthesia: General

## 2013-12-29 MED ORDER — DIGOXIN 125 MCG PO TABS: 0.1250 mg | ORAL_TABLET | Freq: Every day | ORAL | Status: DC

## 2013-12-29 MED ORDER — PROPOFOL 10 MG/ML IV BOLUS
INTRAVENOUS | Status: DC | PRN
  Administered 2013-12-29: 100 mg via INTRAVENOUS

## 2013-12-29 MED ORDER — LIDOCAINE HCL (CARDIAC) 20 MG/ML IV SOLN
INTRAVENOUS | Status: AC
  Administered 2013-12-29: 100 mg
  Filled 2013-12-29: qty 5

## 2013-12-29 MED ORDER — LISINOPRIL 10 MG PO TABS: 10.0000 mg | ORAL_TABLET | Freq: Every day | ORAL | Status: DC

## 2013-12-29 MED ORDER — WARFARIN SODIUM 5 MG PO TABS
5.0000 mg | ORAL_TABLET | Freq: Once | ORAL | Status: DC
  Filled 2013-12-29: qty 1

## 2013-12-29 MED ORDER — LIDOCAINE HCL (CARDIAC) 20 MG/ML IV SOLN
INTRAVENOUS | Status: DC | PRN
  Administered 2013-12-29: 100 mg via INTRAVENOUS

## 2013-12-29 MED ORDER — PHENYLEPHRINE HCL 10 MG/ML IJ SOLN
INTRAMUSCULAR | Status: DC | PRN
  Administered 2013-12-29 (×3): 80 ug via INTRAVENOUS

## 2013-12-29 MED ORDER — CARVEDILOL 12.5 MG PO TABS: 12.5000 mg | ORAL_TABLET | Freq: Two times a day (BID) | ORAL | Status: DC

## 2013-12-29 MED ORDER — LIDOCAINE HCL (PF) 1 % IJ SOLN
INTRAMUSCULAR | Status: AC
  Filled 2013-12-29: qty 5

## 2013-12-29 MED ORDER — SPIRONOLACTONE 25 MG PO TABS: 12.5000 mg | ORAL_TABLET | Freq: Every day | ORAL | Status: DC

## 2013-12-29 NOTE — Transfer of Care (Signed)
Immediate Anesthesia Transfer of Care Note  Patient: Danny Hinton  Procedure(s) Performed: Procedure(s): CARDIOVERSION (N/A)  Patient Location: ICU  Anesthesia Type:General  Level of Consciousness: awake  Airway & Oxygen Therapy: Patient Spontanous Breathing and Patient connected to nasal cannula oxygen  Post-op Assessment: Report given to PACU RN  Post vital signs: Reviewed and stable  Complications:no complications

## 2013-12-29 NOTE — Discharge Summary (Signed)
Physician Discharge Summary    Cardiologist:  Croitoru  Patient ID: Danny Hinton MRN: [id number] DOB/AGE: Jun 06, 1980 33 y.o.  Admit date: 12/27/2013 Discharge date: 12/29/2013  Admission Diagnoses:    Paroxysmal atrial fibrillation  Discharge Diagnoses:  Principal Problem:   Paroxysmal atrial fibrillation Active Problems:   Long term (current) use of anticoagulants   Chronic systolic congestive heart failure   Non-ischemic cardiomyopathy   Automatic implantable cardioverter-defibrillator in situ   Chronic anticoagulation   Atrial flutter with controlled response   Atrial flutter   hypokalemia  Discharged Condition: stable  Hospital Course:   [name] is a 33 y/o M with morbid obesity, HTN, history of presumed NICM diagnosed in 2010 when he was admitted with rapid atrial flutter, and possible h/o PAF. He was 28 at the time when [facility] c/o SOB and EF was found to be 10-20% by echo, 15% by TEE; he underwent DCCV at that time. UDS was negative then along with with normal TSH. He had second degree type 2 AV block during sleeping hours which has raised the suspicion of obstructive sleep apnea as a culprit given his body habitus. It was thought perhaps his cardiomyopathy was viral in etiology. Since that time he has received a dual-chamber St. Jude ICD in 2011 and per Dr. Erin Hearingroitoru's notes has had recurrent paroxysmal atrial fibrillation as well. Last Echo 01/2010: mod dilated LV, EF 30%, mod aortic root dilitation, no significant valvular disease. He is anticoagulated with warfarin, INR 1.96.   He has been doing well recently with the exception of a day in July. The evening of 7/10 he had an ICD shock after missing Coreg/lisinopril for 2 days. I discussed his device interrogation with EP as notes were not clear to the arrhythmia treated; per their review this was likely an inappropriate device discharge for an atrial arrhythmia, possibly 1:1 flutter within his VF zone. His VF  intervals were increased from 12->20 per Dr. Salena Saner at that time. The patient denies any significant symptoms surrounding that episode other than the shock itself.  Yesterday he spent some time out in the sun washing his car and sweating a lot, which was more activity than he's done lately. Around 10pm he noticed his heart was beating "funny" and checked his pulse by his FitBit watch - it was ranging 105-1teens. He came to the ER for further eval and was found to be in atrial flutter with variable block. Diltiazem bolus 5mg  x 2 were given along with drip at 2.5mg /hr. His other meds have been held due to soft BP. He denies any CP, SOB, nausea, bleeding issues including BRBPR or hematuria. He reports compliance with meds recently. Denies orthopnea, weight changes, dyspnea.   Patient was admitted and initially started on IV diltiazem which resulted in good rate controlled.  Some of his BP meds were held due to hypotension.  Dilt was stopped and beta blocker resumed at a lower dose.  Digoxin was added.  He underwent DCCV to NSR.  Dr. Graciela HusbandsKlein recommends, "given the fact that he has had inappropriate shocks for one-to-one atrial flutter I think it is reasonable to undertake ablation of his atrial flutter substrate realizing that he will be left with his atrial fibrillation problem but that this has been associated with significantly slower heart rates. The second will have a profound impact on his PTSD."  Lisinopril restarted at lower dose as was spironolactone.  2D echo revealed mild LVH with upper normal chamber size and  LVEF approximately, 20-25%. Indeterminate diastolic function - atrial flutter present. Mild to moderate aortic root dilatation. Mild to moderate RV dilatation, device wire present.  Mild tricuspid regurgitation with PASP 20 mmHg. Trivial posterior pericardial effusion.  The patient was seen by Dr. Graciela Husbands who felt he was stable for DC home.  Continue Lasix PRN.     Consults: EP  Significant Diagnostic  Studies:  Study Conclusions  - Left ventricle: The cavity size was at the upper limits of normal. Wall thickness was increased in a pattern of mild LVH. Systolic function was severely reduced. The estimated ejection fraction was in the range of 20% to 25%. Diffuse hypokinesis. The study is not technically sufficient to allow evaluation of LV diastolic function. - Aorta: Aortic root dimension: 41 - 46 mm (ED). - Aortic root: The aortic root was mildly to moderately dilated. - Mitral valve: There was trivial regurgitation. - Right ventricle: The cavity size was mildly to moderately dilated. Pacer wire or catheter noted in right ventricle. - Right atrium: The atrium was mildly dilated. - Atrial septum: No defect or patent foramen ovale was identified. - Tricuspid valve: There was mild regurgitation. - Pulmonary arteries: PA peak pressure: 20 mm Hg (S). - Pericardium, extracardiac: A trivial pericardial effusion was identified posterior to the heart.  Impressions:  - Mild LVH with upper normal chamber size and LVEF approximately 20-25%. Indeterminate diastolic function - atrial flutter present. Mild to moderate aortic root dilatation. Mild to moderate RV dilatation, device wire present. Mild tricuspid regurgitation with PASP 20 mmHg. Trivial posterior pericardial effusion.   Treatments: See above.  Discharge Exam: Blood pressure 89/48, pulse 66, temperature 98 F (36.7 C), temperature source Oral, resp. rate 18, height 6\' 4"  (1.93 m), weight 375 lb 7.1 oz (170.3 kg), SpO2 95.00%.   Disposition: 01-Home or Self Care      Discharge Instructions   Diet - low sodium heart healthy    Complete by:  As directed      Increase activity slowly    Complete by:  As directed             Medication List         aspirin EC 81 MG tablet  Take 81 mg by mouth daily.     azelastine 0.1 % nasal spray  Commonly known as:  ASTELIN  2 sprays each nostril at bedtime     carvedilol  12.5 MG tablet  Commonly known as:  COREG  Take 1 tablet (12.5 mg total) by mouth 2 (two) times daily with a meal.     clobetasol cream 0.05 %  Commonly known as:  TEMOVATE  Apply 1 application topically 2 (two) times daily as needed (for eczema).     digoxin 0.125 MG tablet  Commonly known as:  LANOXIN  Take 1 tablet (0.125 mg total) by mouth daily.     Fish Oil 1000 MG Caps  Take 1,000 mg by mouth 2 (two) times daily.     fluticasone 50 MCG/ACT nasal spray  Commonly known as:  FLONASE  1-2 sprays each nostril at bedtime     furosemide 40 MG tablet  Commonly known as:  LASIX  Take 40 mg by mouth daily as needed for fluid.     lisinopril 10 MG tablet  Commonly known as:  PRINIVIL,ZESTRIL  Take 1 tablet (10 mg total) by mouth daily.     spironolactone 25 MG tablet  Commonly known as:  ALDACTONE  Take 0.5 tablets (12.5  mg total) by mouth daily.     VITAMIN C PO  Take 1 tablet by mouth daily.     warfarin 5 MG tablet  Commonly known as:  COUMADIN  Take 5 mg by mouth every evening.        Greater than 30 minutes was spent completing the patient's discharge.    SignedWilburt Finlay, PAC 12/29/2013, 10:31 AM

## 2013-12-29 NOTE — CV Procedure (Signed)
Danny Hinton [id number]  [id number]  l    Preop Dx afib Post op DX  NSR   Procedure  DC Cardioversion   Pt was sedated by anesthesia receiving 100 mg Propafol  A synchronized shock 200 joules restored sinus Rhythm   Pt tolerated without difficulty   Devi ce reprogrammed to lower rae of 60   Sherryl Manges, MD 12/29/2013 8:18 AM

## 2013-12-29 NOTE — Anesthesia Preprocedure Evaluation (Addendum)
Anesthesia Evaluation  Patient identified by MRN, date of birth, ID band Patient awake    Airway Mallampati: I TM Distance: >3 FB Neck ROM: Full    Dental  (+) Dental Advisory Given, Teeth Intact   Pulmonary          Cardiovascular Exercise Tolerance: Poor hypertension, Pt. on medications and Pt. on home beta blockers +CHF + dysrhythmias Atrial Fibrillation + Cardiac Defibrillator     Neuro/Psych    GI/Hepatic   Endo/Other  Morbid obesity  Renal/GU      Musculoskeletal   Abdominal   Peds  Hematology   Anesthesia Other Findings   Reproductive/Obstetrics                        Anesthesia Physical Anesthesia Plan  ASA: IV  Anesthesia Plan: General   Post-op Pain Management:    Induction: Intravenous  Airway Management Planned: Mask  Additional Equipment:   Intra-op Plan:   Post-operative Plan:   Informed Consent: I have reviewed the patients History and Physical, chart, labs and discussed the procedure including the risks, benefits and alternatives for the proposed anesthesia with the patient or authorized representative who has indicated his/her understanding and acceptance.     Plan Discussed with: CRNA, Surgeon and Anesthesiologist  Anesthesia Plan Comments:         Anesthesia Quick Evaluation

## 2013-12-29 NOTE — Progress Notes (Signed)
Pt ambulated twice around unit with no complaints and remained in sinus rhythm. 2 PIV d'c'd catheter tip intact. D/C instructions given and verbalizes understanding. Sent via WC to private vehicle.

## 2013-12-29 NOTE — Progress Notes (Signed)
ANTICOAGULATION CONSULT NOTE - Follow Up Consult  Pharmacy Consult for Coumadin Indication: atrial fibrillation  No Known Allergies  Patient Measurements: Height: 6\' 4"  (193 cm) Weight: 375 lb 7.1 oz (170.3 kg) IBW/kg (Calculated) : 86.8  Vital Signs: Temp: 98 F (36.7 C) (08/22 0400) Temp src: Oral (08/22 0400) BP: 89/48 mmHg (08/22 0700) Pulse Rate: 66 (08/22 0700)  Labs:  Recent Labs  12/27/13 0141 12/27/13 0603 12/27/13 1157 12/28/13 0605 12/28/13 1830 12/29/13 0244  HGB 14.3  --   --  13.5  --  13.7  HCT 43.3  --   --  42.3  --  42.5  PLT 283  --   --  259  --  243  LABPROT 22.3*  --   --  23.5* 23.9* 27.2*  INR 1.96*  --   --  2.09* 2.14* 2.52*  CREATININE 0.94  --   --  0.81 0.86 0.89  TROPONINI <0.30 <0.30 <0.30  --   --   --     Estimated Creatinine Clearance: 202.6 ml/min (by C-G formula based on Cr of 0.89).  Assessment: 33yom admitted with recurrent aflutter continues on his home coumadin. INR is therapeutic on home dose of 5mg  daily. For cardioversion and aflutter ablation today. CBC stable. No bleeding reported.  Goal of Therapy:  INR 2-3 Monitor platelets by anticoagulation protocol: Yes   Plan:  1) Coumadin 5mg  x 1 tonight 2) INR in AM  Fredrik Rigger 12/29/2013,8:04 AM

## 2013-12-29 NOTE — Progress Notes (Addendum)
Patient Name: Danny Hinton      SUBJECTIVE: heart rate goes up with standing  Past Medical History  Diagnosis Date  . Chronic systolic congestive heart failure     a. suspected NICM - dx 2010. EF 15% by TEE, 10-20% by echo at that time. b. s/p St. Jude AICD 2011. c. Echo 01/2010: mod dilated LV, EF 30%, mod aortic root dilitation, no significant valvular disease.  Marland Kitchen Hypertension   . Eczema   . PAF (paroxysmal atrial fibrillation)   . Automatic implantable cardioverter-defibrillator in situ     a. St Jude in 2011.  Marland Kitchen Chronic anticoagulation   . Nonischemic cardiomyopathy   . Eczema   . Paroxysmal atrial flutter   . Mobitz type 2 second degree atrioventricular block     a. During sleeping hours in 2010 suspected due to ? OSA.  . Morbid obesity     Scheduled Meds:  Scheduled Meds: . carvedilol  12.5 mg Oral BID WC  . digoxin  0.125 mg Oral Daily  . pantoprazole  40 mg Oral Daily  . sodium chloride  3 mL Intravenous Q12H  . sodium chloride  3 mL Intravenous Q12H  . Warfarin - Pharmacist Dosing Inpatient   Does not apply Q24H   Continuous Infusions: . sodium chloride    . sodium chloride 50 mL/hr at 12/29/13 0600   sodium chloride, alum & mag hydroxide-simeth, sodium chloride, sodium chloride    PHYSICAL EXAM Filed Vitals:   12/29/13 0500 12/29/13 0555 12/29/13 0600 12/29/13 0700  BP: 101/66  108/74 89/48  Pulse: 78  75 66  Temp:      TempSrc:      Resp: 20  17 18   Height:      Weight:  375 lb 7.1 oz (170.3 kg)    SpO2: 97%  99% 95%    Well developed and nourished in no acute distress HENT normal Neck supple with JVP-flat Carotids brisk and full without bruits Clear Irregularly irregular rate and rhythm with controlled  ventricular response, no murmurs or gallops Abd-soft with active BS without hepatomegaly No Clubbing cyanosis edema Skin-warm and dry A & Oriented  Grossly normal sensory and motor function   TELEMETRY: Reviewed telemetry pt in  *afib controlled VR    Intake/Output Summary (Last 24 hours) at 12/29/13 0750 Last data filed at 12/29/13 0700  Gross per 24 hour  Intake   1090 ml  Output    950 ml  Net    140 ml    LABS: Basic Metabolic Panel:  Recent Labs Lab 12/27/13 0141 12/27/13 0604 12/28/13 0605 12/28/13 1830 12/29/13 0244  NA 137  --  135* 136* 137  K 3.6*  --  4.0 4.0 4.3  CL 99  --  103 101 103  CO2 24  --  21 21 20   GLUCOSE 124*  --  111* 85 118*  BUN 16  --  11 12 13   CREATININE 0.94  --  0.81 0.86 0.89  CALCIUM 9.4  --  9.3 9.1 8.9  MG  --  1.9  --   --   --    Cardiac Enzymes:  Recent Labs  12/27/13 0141 12/27/13 0603 12/27/13 1157  TROPONINI <0.30 <0.30 <0.30   CBC:  Recent Labs Lab 12/27/13 0141 12/28/13 0605 12/29/13 0244  WBC 5.7 4.1 4.6  NEUTROABS 3.4  --   --   HGB 14.3 13.5 13.7  HCT 43.3 42.3 42.5  MCV  83.9 86.9 84.0  PLT 283 259 243   PROTIME:  Recent Labs  12/28/13 0605 12/28/13 1830 12/29/13 0244  LABPROT 23.5* 23.9* 27.2*  INR 2.09* 2.14* 2.52*   Liver Function Tests:  Recent Labs  12/27/13 0141  AST 17  ALT 21  ALKPHOS 50  BILITOT 0.7  PROT 8.4*  ALBUMIN 4.1   No results found for this basename: LIPASE, AMYLASE,  in the last 72 hours BNP: BNP (last 3 results)  Recent Labs  12/27/13 0141  PROBNP 37.5   D-Dimer: No results found for this basename: DDIMER,  in the last 72 hours Hemoglobin A1C: No results found for this basename: HGBA1C,  in the last 72 hours Fasting Lipid Panel: No results found for this basename: CHOL, HDL, LDLCALC, TRIG, CHOLHDL, LDLDIRECT,  in the last 72 hours Thyroid Function Tests:  Recent Labs  12/27/13 0603  TSH 2.460   Anemia Panel: No results found for this basename: VITAMINB12, FOLATE, FERRITIN, TIBC, IRON, RETICCTPCT,  in the last 72 hours   Device Interrogation:    ASSESSMENT AND PLAN:  Principal Problem:   Paroxysmal atrial fibrillation Active Problems:   Long term (current) use of  anticoagulants   Chronic systolic congestive heart failure   Non-ischemic cardiomyopathy   Automatic implantable cardioverter-defibrillator in situ   Chronic anticoagulation   Atrial flutter with controlled response   Atrial flutter  For cardioversion this am With anticipated outpt RFCA wth anestheas Discharge will have to decide re outpt meds and hypotension Discharge on lisinopril 10 mg daily and aldactone 12.5 plus carvedilol and dig  Signed, Sherryl MangesSteven Klein MD  12/29/2013

## 2013-12-29 NOTE — Anesthesia Postprocedure Evaluation (Signed)
  Anesthesia Post-op Note  Patient: Danny Hinton  Procedure(s) Performed: Procedure(s): CARDIOVERSION (N/A)  Patient Location: ICU  Anesthesia Type:General  Level of Consciousness: awake and alert   Airway and Oxygen Therapy: Patient Spontanous Breathing and Patient connected to nasal cannula oxygen  Post-op Pain: none  Post-op Assessment: Post-op Vital signs reviewed  Post-op Vital Signs: Reviewed and stable  Last Vitals:  Filed Vitals:   12/29/13 0700  BP: 89/48  Pulse: 66  Temp:   Resp: 18    Complications: No apparent anesthesia complications

## 2013-12-29 NOTE — Addendum Note (Signed)
Addendum created 12/29/13 0853 by Laverle Hobby, MD   Modules edited: Clinical Notes, Problem List   Clinical Notes:  File: [id number]   Problem List:  Loraine Leriche As Reviewed

## 2013-12-29 NOTE — Addendum Note (Signed)
Addendum created 12/29/13 0840 by Atilano Ina, CRNA   Modules edited: Clinical Notes   Clinical Notes:  File: [id number]

## 2013-12-31 ENCOUNTER — Telehealth: Payer: Self-pay | Admitting: Cardiovascular Disease

## 2013-12-31 ENCOUNTER — Encounter (HOSPITAL_COMMUNITY): Payer: Self-pay | Admitting: Internal Medicine

## 2013-12-31 NOTE — Telephone Encounter (Signed)
Updated pt we received his remotes. No new AT/AF since cardioversion on 12/29/13. Pt does not know if/when he will be scheduled for a-flutter ablation.

## 2013-12-31 NOTE — Telephone Encounter (Signed)
New message ° ° ° ° ° ° °Did we get his remote transmission? °

## 2013-12-31 NOTE — Telephone Encounter (Signed)
Pt called in stating that he was suppose to be scheduled for an ablation with Dr.C....Marland Kitchen Please call  Thanks

## 2013-12-31 NOTE — Telephone Encounter (Signed)
Closed encounter °

## 2013-12-31 NOTE — Telephone Encounter (Signed)
Follow up:    Pt called to find out about his last reading please give him a call back.

## 2014-01-04 ENCOUNTER — Other Ambulatory Visit: Payer: Self-pay | Admitting: *Deleted

## 2014-01-04 ENCOUNTER — Telehealth: Payer: Self-pay | Admitting: *Deleted

## 2014-01-04 ENCOUNTER — Encounter: Payer: Self-pay | Admitting: *Deleted

## 2014-01-04 DIAGNOSIS — Z01812 Encounter for preprocedural laboratory examination: Secondary | ICD-10-CM

## 2014-01-04 DIAGNOSIS — I4892 Unspecified atrial flutter: Secondary | ICD-10-CM

## 2014-01-04 NOTE — Telephone Encounter (Signed)
lmtcb  Need to inform/discuss upcoming A Flutter ablation with Dr. Graciela Husbands on 01/18/14.

## 2014-01-04 NOTE — Telephone Encounter (Signed)
Informed patient ablation scheduled for 9/11 w/ Dr. Graciela Husbands. Reviewed letter of instructions with patient and left at front desk for pick up. Pre procedure labs scheduled for 9/4 Patient verbalized understanding and agreeable to plan.

## 2014-01-07 ENCOUNTER — Telehealth: Payer: Self-pay | Admitting: Internal Medicine

## 2014-01-07 NOTE — Telephone Encounter (Signed)
New message    patient calling need device clinic to call him .

## 2014-01-07 NOTE — Telephone Encounter (Signed)
Patient informed that he has been in NSR since hosp on 8-22. Patient voiced understanding.

## 2014-01-11 ENCOUNTER — Other Ambulatory Visit (INDEPENDENT_AMBULATORY_CARE_PROVIDER_SITE_OTHER): Payer: Medicare Other

## 2014-01-11 DIAGNOSIS — Z01812 Encounter for preprocedural laboratory examination: Secondary | ICD-10-CM | POA: Diagnosis not present

## 2014-01-11 DIAGNOSIS — I4892 Unspecified atrial flutter: Secondary | ICD-10-CM | POA: Diagnosis not present

## 2014-01-11 LAB — BASIC METABOLIC PANEL
BUN: 10 mg/dL (ref 6–23)
CHLORIDE: 103 meq/L (ref 96–112)
CO2: 24 meq/L (ref 19–32)
Calcium: 9 mg/dL (ref 8.4–10.5)
Creatinine, Ser: 1 mg/dL (ref 0.4–1.5)
GFR: 117.8 mL/min (ref >60.00)
Glucose, Bld: 88 mg/dL (ref 70–99)
Potassium: 3.9 mEq/L (ref 3.5–5.1)
SODIUM: 135 meq/L (ref 135–145)

## 2014-01-11 LAB — CBC WITH DIFFERENTIAL/PLATELET
BASOS PCT: 0.8 % (ref 0.0–3.0)
Basophils Absolute: 0 10*3/uL (ref 0.0–0.1)
Eosinophils Absolute: 0.2 10*3/uL (ref 0.0–0.7)
Eosinophils Relative: 4.8 % (ref 0.0–5.0)
HCT: 39.5 % (ref 39.0–52.0)
Hemoglobin: 12.9 g/dL — ABNORMAL LOW (ref 13.0–17.0)
LYMPHS PCT: 42.8 % (ref 12.0–46.0)
Lymphs Abs: 1.7 10*3/uL (ref 0.7–4.0)
MCHC: 32.8 g/dL (ref 30.0–36.0)
MCV: 83.1 fl (ref 78.0–100.0)
MONOS PCT: 10 % (ref 3.0–12.0)
Monocytes Absolute: 0.4 10*3/uL (ref 0.1–1.0)
Neutro Abs: 1.6 10*3/uL (ref 1.4–7.7)
Neutrophils Relative %: 41.6 % — ABNORMAL LOW (ref 43.0–77.0)
Platelets: 301 10*3/uL (ref 150.0–400.0)
RBC: 4.76 Mil/uL (ref 4.22–5.81)
RDW: 14.9 % (ref 11.5–15.5)
WBC: 4 10*3/uL (ref 4.0–10.5)

## 2014-01-11 LAB — PROTIME-INR
INR: 3.5 ratio — ABNORMAL HIGH (ref 0.8–1.0)
PROTHROMBIN TIME: 37.9 s — AB (ref 9.6–13.1)

## 2014-01-15 ENCOUNTER — Encounter (HOSPITAL_COMMUNITY): Payer: Self-pay | Admitting: Pharmacy Technician

## 2014-01-15 ENCOUNTER — Telehealth: Payer: Self-pay | Admitting: Cardiovascular Disease

## 2014-01-15 NOTE — Telephone Encounter (Signed)
Please call,question about a medicine that he was not aware he was taking.(Digoxin)

## 2014-01-16 NOTE — Telephone Encounter (Signed)
Did not notice Digoxin on his discharge list but he picked it up from the pharmacy yesterday and started.  Patient informed it was started during his last hospitalization.  Voiced understanding and will take daily.

## 2014-01-17 ENCOUNTER — Ambulatory Visit (INDEPENDENT_AMBULATORY_CARE_PROVIDER_SITE_OTHER): Payer: Medicare Other | Admitting: Pharmacist

## 2014-01-17 DIAGNOSIS — I480 Paroxysmal atrial fibrillation: Secondary | ICD-10-CM

## 2014-01-17 DIAGNOSIS — I4891 Unspecified atrial fibrillation: Secondary | ICD-10-CM | POA: Diagnosis not present

## 2014-01-17 DIAGNOSIS — Z7901 Long term (current) use of anticoagulants: Secondary | ICD-10-CM | POA: Diagnosis not present

## 2014-01-17 LAB — POCT INR: INR: 2.5

## 2014-01-17 NOTE — Patient Instructions (Signed)
Anticoagulation Dose Instructions as of 01/17/2014     Danny Hinton Tue Wed Thu Fri Sat   New Dose 5 mg 5 mg 5 mg 5 mg 5 mg 5 mg 5 mg    Description       Continue one tablet daily.      INR was 2.5 today

## 2014-01-18 ENCOUNTER — Encounter (HOSPITAL_COMMUNITY)
Admission: RE | Disposition: A | Discharge status: Home / Self Care | Payer: Self-pay | Source: Ambulatory Visit | Attending: Internal Medicine

## 2014-01-18 ENCOUNTER — Ambulatory Visit (HOSPITAL_COMMUNITY)
Admission: RE | Admit: 2014-01-18 | Discharge: 2014-01-19 | Disposition: A | Discharge status: Home / Self Care | Payer: Medicare Other | Source: Ambulatory Visit | Attending: Internal Medicine | Admitting: Internal Medicine

## 2014-01-18 ENCOUNTER — Encounter (HOSPITAL_COMMUNITY): Payer: Medicare Other | Admitting: Certified Registered"

## 2014-01-18 ENCOUNTER — Ambulatory Visit (HOSPITAL_COMMUNITY): Payer: Medicare Other | Admitting: Certified Registered"

## 2014-01-18 ENCOUNTER — Ambulatory Visit: Payer: Medicare Other | Admitting: Physician Assistant

## 2014-01-18 ENCOUNTER — Encounter (HOSPITAL_COMMUNITY): Payer: Self-pay | Admitting: Certified Registered"

## 2014-01-18 DIAGNOSIS — I428 Other cardiomyopathies: Secondary | ICD-10-CM | POA: Diagnosis not present

## 2014-01-18 DIAGNOSIS — I5022 Chronic systolic (congestive) heart failure: Secondary | ICD-10-CM | POA: Diagnosis not present

## 2014-01-18 DIAGNOSIS — I4891 Unspecified atrial fibrillation: Secondary | ICD-10-CM | POA: Insufficient documentation

## 2014-01-18 DIAGNOSIS — Z7901 Long term (current) use of anticoagulants: Secondary | ICD-10-CM | POA: Insufficient documentation

## 2014-01-18 DIAGNOSIS — I4892 Unspecified atrial flutter: Secondary | ICD-10-CM | POA: Diagnosis not present

## 2014-01-18 DIAGNOSIS — I509 Heart failure, unspecified: Secondary | ICD-10-CM | POA: Diagnosis not present

## 2014-01-18 DIAGNOSIS — Z95810 Presence of automatic (implantable) cardiac defibrillator: Secondary | ICD-10-CM | POA: Diagnosis not present

## 2014-01-18 DIAGNOSIS — Z7982 Long term (current) use of aspirin: Secondary | ICD-10-CM | POA: Diagnosis not present

## 2014-01-18 DIAGNOSIS — Z6841 Body Mass Index (BMI) 40.0 and over, adult: Secondary | ICD-10-CM | POA: Insufficient documentation

## 2014-01-18 HISTORY — PX: ATRIAL FLUTTER ABLATION: SHX5733

## 2014-01-18 LAB — PROTIME-INR
INR: 2.35 — ABNORMAL HIGH (ref 0.00–1.49)
PROTHROMBIN TIME: 25.7 s — AB (ref 11.6–15.2)

## 2014-01-18 SURGERY — ATRIAL FLUTTER ABLATION
Anesthesia: General

## 2014-01-18 MED ORDER — EPHEDRINE SULFATE 50 MG/ML IJ SOLN
INTRAMUSCULAR | Status: DC | PRN
  Administered 2014-01-18: 10 mg via INTRAVENOUS
  Administered 2014-01-18: 5 mg via INTRAVENOUS
  Administered 2014-01-18: 10 mg via INTRAVENOUS

## 2014-01-18 MED ORDER — ETOMIDATE 2 MG/ML IV SOLN
INTRAVENOUS | Status: DC | PRN
  Administered 2014-01-18: 20 mg via INTRAVENOUS

## 2014-01-18 MED ORDER — BUPIVACAINE HCL (PF) 0.25 % IJ SOLN
INTRAMUSCULAR | Status: AC
  Filled 2014-01-18: qty 30

## 2014-01-18 MED ORDER — WARFARIN SODIUM 5 MG PO TABS
5.0000 mg | ORAL_TABLET | Freq: Every evening | ORAL | Status: DC
  Administered 2014-01-18: 18:00:00 5 mg via ORAL
  Filled 2014-01-18 (×2): qty 1

## 2014-01-18 MED ORDER — CARVEDILOL 12.5 MG PO TABS
12.5000 mg | ORAL_TABLET | Freq: Two times a day (BID) | ORAL | Status: DC
  Administered 2014-01-19: 12.5 mg via ORAL
  Filled 2014-01-18 (×2): qty 1

## 2014-01-18 MED ORDER — ONDANSETRON HCL 4 MG/2ML IJ SOLN: 4.0000 mg | Freq: Four times a day (QID) | INTRAMUSCULAR | Status: DC | PRN

## 2014-01-18 MED ORDER — ACETAMINOPHEN 325 MG PO TABS
650.0000 mg | ORAL_TABLET | ORAL | Status: DC | PRN
Start: 2014-01-18 — End: 2014-01-19

## 2014-01-18 MED ORDER — ARTIFICIAL TEARS OP OINT
TOPICAL_OINTMENT | OPHTHALMIC | Status: DC | PRN
  Administered 2014-01-18: 1 via OPHTHALMIC

## 2014-01-18 MED ORDER — GLYCOPYRROLATE 0.2 MG/ML IJ SOLN
INTRAMUSCULAR | Status: DC | PRN
  Administered 2014-01-18: 1 mg via INTRAVENOUS

## 2014-01-18 MED ORDER — ALUM & MAG HYDROXIDE-SIMETH 200-200-20 MG/5ML PO SUSP
30.0000 mL | ORAL | Status: DC | PRN
  Administered 2014-01-18: 30 mL via ORAL
  Filled 2014-01-18: qty 30

## 2014-01-18 MED ORDER — FENTANYL CITRATE 0.05 MG/ML IJ SOLN
INTRAMUSCULAR | Status: DC | PRN
  Administered 2014-01-18: 100 ug via INTRAVENOUS

## 2014-01-18 MED ORDER — SODIUM CHLORIDE 0.9 % IV SOLN: Freq: Once | INTRAVENOUS | Status: DC

## 2014-01-18 MED ORDER — SODIUM CHLORIDE 0.9 % IV SOLN: 250.0000 mL | INTRAVENOUS | Status: DC | PRN

## 2014-01-18 MED ORDER — PHENYLEPHRINE HCL 10 MG/ML IJ SOLN
INTRAMUSCULAR | Status: DC | PRN
  Administered 2014-01-18 (×2): 80 ug via INTRAVENOUS
  Administered 2014-01-18: 120 ug via INTRAVENOUS
  Administered 2014-01-18: 40 ug via INTRAVENOUS
  Administered 2014-01-18: 120 ug via INTRAVENOUS
  Administered 2014-01-18 (×3): 80 ug via INTRAVENOUS

## 2014-01-18 MED ORDER — AZELASTINE HCL 0.1 % NA SOLN
2.0000 | Freq: Every day | NASAL | Status: DC
  Administered 2014-01-18: 2 via NASAL
  Filled 2014-01-18: qty 30

## 2014-01-18 MED ORDER — SODIUM CHLORIDE 0.9 % IJ SOLN: 3.0000 mL | Freq: Two times a day (BID) | INTRAMUSCULAR | Status: DC

## 2014-01-18 MED ORDER — FLUTICASONE PROPIONATE 50 MCG/ACT NA SUSP
1.0000 | Freq: Every day | NASAL | Status: DC
  Administered 2014-01-18: 1 via NASAL
  Filled 2014-01-18: qty 16

## 2014-01-18 MED ORDER — OFF THE BEAT BOOK
Freq: Once | Status: AC
Start: 2014-01-18 — End: 2014-01-18
  Administered 2014-01-18: 21:00:00
  Filled 2014-01-18: qty 1

## 2014-01-18 MED ORDER — SODIUM CHLORIDE 0.9 % IJ SOLN: 3.0000 mL | INTRAMUSCULAR | Status: DC | PRN

## 2014-01-18 MED ORDER — NEOSTIGMINE METHYLSULFATE 10 MG/10ML IV SOLN
INTRAVENOUS | Status: DC | PRN
  Administered 2014-01-18: 5 mg via INTRAVENOUS

## 2014-01-18 MED ORDER — ASPIRIN EC 81 MG PO TBEC
81.0000 mg | DELAYED_RELEASE_TABLET | Freq: Every day | ORAL | Status: DC
  Administered 2014-01-19: 10:00:00 81 mg via ORAL
  Filled 2014-01-18: qty 1

## 2014-01-18 MED ORDER — SUCCINYLCHOLINE CHLORIDE 20 MG/ML IJ SOLN
INTRAMUSCULAR | Status: DC | PRN
  Administered 2014-01-18: 100 mg via INTRAVENOUS

## 2014-01-18 MED ORDER — LISINOPRIL 20 MG PO TABS
20.0000 mg | ORAL_TABLET | Freq: Two times a day (BID) | ORAL | Status: DC
  Administered 2014-01-19: 20 mg via ORAL
  Filled 2014-01-18 (×3): qty 1

## 2014-01-18 MED ORDER — WARFARIN - PHYSICIAN DOSING INPATIENT: Freq: Every day | Status: DC

## 2014-01-18 MED ORDER — HEPARIN (PORCINE) IN NACL 2-0.9 UNIT/ML-% IJ SOLN
INTRAMUSCULAR | Status: AC
  Filled 2014-01-18: qty 500

## 2014-01-18 MED ORDER — ONDANSETRON HCL 4 MG/2ML IJ SOLN
INTRAMUSCULAR | Status: DC | PRN
  Administered 2014-01-18: 4 mg via INTRAVENOUS

## 2014-01-18 MED ORDER — SODIUM CHLORIDE 0.9 % IV SOLN
INTRAVENOUS | Status: DC
  Administered 2014-01-18: 13:00:00 via INTRAVENOUS

## 2014-01-18 MED ORDER — LIDOCAINE HCL (CARDIAC) 20 MG/ML IV SOLN
INTRAVENOUS | Status: DC | PRN
  Administered 2014-01-18: 80 mg via INTRAVENOUS

## 2014-01-18 MED ORDER — SPIRONOLACTONE 25 MG PO TABS
25.0000 mg | ORAL_TABLET | Freq: Every day | ORAL | Status: DC
  Administered 2014-01-19: 10:00:00 25 mg via ORAL
  Filled 2014-01-18 (×2): qty 1

## 2014-01-18 MED ORDER — FENTANYL CITRATE 0.05 MG/ML IJ SOLN: 25.0000 ug | INTRAMUSCULAR | Status: DC | PRN

## 2014-01-18 MED ORDER — ROCURONIUM BROMIDE 100 MG/10ML IV SOLN
INTRAVENOUS | Status: DC | PRN
  Administered 2014-01-18 (×2): 20 mg via INTRAVENOUS
  Administered 2014-01-18: 30 mg via INTRAVENOUS

## 2014-01-18 MED ORDER — FUROSEMIDE 40 MG PO TABS
40.0000 mg | ORAL_TABLET | Freq: Every day | ORAL | Status: DC | PRN
  Filled 2014-01-18: qty 1

## 2014-01-18 MED ORDER — DIGOXIN 125 MCG PO TABS
0.1250 mg | ORAL_TABLET | Freq: Every day | ORAL | Status: DC
  Administered 2014-01-18: 0.125 mg via ORAL
  Filled 2014-01-18 (×2): qty 1

## 2014-01-18 NOTE — Progress Notes (Signed)
Site area: rt groin Site Prior to Removal:  Level 0 Pressure Applied For: 20 Manual:   yes Patient Status During Pull:  stable Post Pull Site:  Level 0 Post Pull Instructions Given:  yes Post Pull Pulses Present: yes Dressing Applied:  yes Bedrest begins @ 1620 Comments: no complications

## 2014-01-18 NOTE — Anesthesia Preprocedure Evaluation (Addendum)
Anesthesia Evaluation  Patient identified by MRN, date of birth, ID band Patient awake    Reviewed: Allergy & Precautions, H&P , NPO status , Patient's Chart, lab work & pertinent test results, reviewed documented beta blocker date and time   History of Anesthesia Complications Negative for: history of anesthetic complications  Airway Mallampati: I TM Distance: >3 FB Neck ROM: Full    Dental  (+) Teeth Intact, Missing, Chipped, Dental Advisory Given,    Pulmonary neg pulmonary ROS,  breath sounds clear to auscultation        Cardiovascular hypertension, Pt. on home beta blockers and Pt. on medications - angina+CHF + dysrhythmias Atrial Fibrillation + Cardiac Defibrillator Rhythm:Irregular     Neuro/Psych negative neurological ROS  negative psych ROS   GI/Hepatic Neg liver ROS, GERD-  Poorly Controlled,  Endo/Other  Morbid obesity  Renal/GU negative Renal ROS     Musculoskeletal   Abdominal   Peds  Hematology   Anesthesia Other Findings   Reproductive/Obstetrics                        Anesthesia Physical Anesthesia Plan  ASA: III  Anesthesia Plan: General   Post-op Pain Management:    Induction: Intravenous  Airway Management Planned: Oral ETT  Additional Equipment: Arterial line  Intra-op Plan:   Post-operative Plan: Extubation in OR  Informed Consent: I have reviewed the patients History and Physical, chart, labs and discussed the procedure including the risks, benefits and alternatives for the proposed anesthesia with the patient or authorized representative who has indicated his/her understanding and acceptance.   Dental advisory given  Plan Discussed with: CRNA and Surgeon  Anesthesia Plan Comments:         Anesthesia Quick Evaluation

## 2014-01-18 NOTE — CV Procedure (Signed)
[name] E Lebron [id number]  [id number]  Preop IN:OMVEHM flutter  Postop Dx same/   Procedure:EPS with egram mapping and RF ablation  Cx: None   EBL: Minimal    Dictation number [id number]  Sherryl Manges, MD 01/18/2014 3:08 PM

## 2014-01-18 NOTE — Interval H&P Note (Signed)
History and Physical Interval Note:  01/18/2014 12:11 PM  [name] E Redder  has presented today for surgery, with the diagnosis of aflutter  The various methods of treatment have been discussed with the patient and family. After consideration of risks, benefits and other options for treatment, the patient has consented to  Procedure(s): ATRIAL FLUTTER ABLATION (N/A) as a surgical intervention .  The patient's history has been reviewed, patient examined, no change in status, stable for surgery.  I have reviewed the patient's chart and labs.  Questions were answered to the patient's satisfaction.     Sherryl Manges  Pt for atrial flutter ablation given history of 1:1 flutter and inappropriate ICD discharge   He also has atrial fibrillation and this procedure pursued to avoid the former problem  He understands the limitations of the approach  Has some orthostatic lightheadedness  Will proceed with general anesthesia and A line if thought appropriate by anesthesia team

## 2014-01-18 NOTE — Transfer of Care (Signed)
Immediate Anesthesia Transfer of Care Note  Patient: Danny Hinton  Procedure(s) Performed: Procedure(s): ATRIAL FLUTTER ABLATION (N/A)  Patient Location: PACU and Cath Lab  Anesthesia Type:General  Level of Consciousness: awake and oriented  Airway & Oxygen Therapy: Patient Spontanous Breathing and Patient connected to nasal cannula oxygen  Post-op Assessment: Report given to PACU RN  Post vital signs: Reviewed and stable  Complications: No apparent anesthesia complications

## 2014-01-18 NOTE — Progress Notes (Signed)
2 rt femoral venous sheaths removed by Christie Nottingham

## 2014-01-18 NOTE — Anesthesia Procedure Notes (Signed)
Procedure Name: Intubation Date/Time: 01/18/2014 1:56 PM Performed by: Jefm Miles E Pre-anesthesia Checklist: Patient identified, Emergency Drugs available, Suction available, Patient being monitored and Timeout performed Patient Re-evaluated:Patient Re-evaluated prior to inductionOxygen Delivery Method: Circle system utilized Preoxygenation: Pre-oxygenation with 100% oxygen Intubation Type: Rapid sequence and Combination inhalational/ intravenous induction Ventilation: Mask ventilation without difficulty Laryngoscope Size: Mac and 4 Grade View: Grade I Tube type: Oral Tube size: 7.5 mm Number of attempts: 1 Airway Equipment and Method: Stylet and Video-laryngoscopy Placement Confirmation: ETT inserted through vocal cords under direct vision,  positive ETCO2 and breath sounds checked- equal and bilateral Secured at: 23 cm Tube secured with: Tape Dental Injury: Teeth and Oropharynx as per pre-operative assessment  Future Recommendations: Recommend- induction with short-acting agent, and alternative techniques readily available Comments: Elective Video-laryngoscope used due to large tongue, thick neck and facial hair. Grade 1 view with video, anterior larynx noted.

## 2014-01-18 NOTE — H&P (View-Only) (Signed)
ELECTROPHYSIOLOGY CONSULT NOTE  Patient ID: Danny Hinton, MRN: [id number], DOB/AGE: 10/06/1980 33 y.o. Admit date: 12/27/2013 Date of Consult: 12/28/2013  Primary Physician: Rudi Heap, MD Primary Cardiologist: mc  Chief Complaint: ATRIAL FLUTTER   HPI Danny Hinton is a 33 y.o. male  Seen in consultation for atrial flutter.  He was admitted yesterday because of palpitations associated with shortness of breath and lightheadedness. He was found to be in atrial flutter at that hospital and transferred to [place].  He has a history of atrial flutter. He initially presented in 2010 with atrial flutter. Ejection fraction at that time was 10-20% he underwent cardioversion. He apparently has maintained sinus rhythm until early July 2015 he developed similar symptoms as noted above and in received an ICD shock.  Device interrogation demonstrated that there was one-to-one atrial flutter and shock. He had not been taking his medications. His device was reprogrammed to increase his detection rate. This resolved his symptoms and treated for himself a significant amount of anxiety related to the possibility of ICD shock.  Interrogation of his device also demonstrated that it had 2 episodes in the last month along (greater than 24 hours) atrial fibrillation; maximum heart rate from 120s. He was unaware of both of these episodes.  At baseline he has relatively good exercise tolerance. He denies peripheral edema or nocturnal dyspnea.  He's had a recent sleep study that was unrevealing.  Echocardiogram done this hospitalization demonstrated ejection fraction 20-25% with moderate aortic root dilatation the right ventricle is also mildly to moderately dilated as is the right atrium.  Past Medical History  Diagnosis Date  . Chronic systolic congestive heart failure     a. suspected NICM - dx 2010. EF 15% by TEE, 10-20% by echo at that time. b. s/p St. Jude AICD 2011. c. Echo 01/2010: mod dilated LV, EF  30%, mod aortic root dilitation, no significant valvular disease.  Marland Kitchen Hypertension   . Eczema   . PAF (paroxysmal atrial fibrillation)   . Automatic implantable cardioverter-defibrillator in situ     a. St Jude in 2011.  Marland Kitchen Chronic anticoagulation   . Nonischemic cardiomyopathy   . Eczema   . Paroxysmal atrial flutter   . Mobitz type 2 second degree atrioventricular block     a. During sleeping hours in 2010 suspected due to ? OSA.  . Morbid obesity       Surgical History:  Past Surgical History  Procedure Laterality Date  . Cardiac defibrillator placement  02/17/10    St. Jude Medical 45DR, model number [id number], serial number [id number]  . Tooth extraction N/A 10/23/2012    Procedure: EXTRACTION TEETH 1, 16, 17, 30, 31;  Surgeon: [name], DDS;  Location: [facility] OR;  Service: Oral Surgery;  Laterality: N/A;     Home Meds: Prior to Admission medications   Medication Sig Start Date End Date Taking? Authorizing Provider  Ascorbic Acid (VITAMIN C PO) Take 1 tablet by mouth daily.   Yes Historical Provider, MD  aspirin EC 81 MG tablet Take 81 mg by mouth daily.   Yes Historical Provider, MD  azelastine (ASTELIN) 0.1 % nasal spray 2 sprays each nostril at bedtime 12/01/13  Yes Ernestina Penna, MD  carvedilol (COREG) 25 MG tablet Take 1 tablet (25 mg total) by mouth 2 (two) times daily with a meal. 07/26/13  Yes Mihai Croitoru, MD  clobetasol cream (TEMOVATE) 0.05 % Apply 1 application topically 2 (two) times daily as needed (for eczema).  03/12/13  Yes Deatra Canter, FNP  fluticasone Aleda Grana) 50 MCG/ACT nasal spray 1-2 sprays each nostril at bedtime 12/01/13  Yes Ernestina Penna, MD  furosemide (LASIX) 40 MG tablet Take 40 mg by mouth daily as needed for fluid.   Yes Historical Provider, MD  lisinopril (PRINIVIL,ZESTRIL) 20 MG tablet Take 20 mg by mouth 2 (two) times daily.   Yes Historical Provider, MD  Omega-3 Fatty Acids (FISH OIL) 1000 MG CAPS Take 1,000 mg by mouth 2 (two) times daily.    Yes Historical Provider, MD  spironolactone (ALDACTONE) 25 MG tablet Take 1 tablet (25 mg total) by mouth daily. 05/14/13  Yes Mihai Croitoru, MD  warfarin (COUMADIN) 5 MG tablet Take 5 mg by mouth every evening.   Yes Historical Provider, MD    Inpatient Medications:  . pantoprazole  40 mg Oral Daily  . sodium chloride  3 mL Intravenous Q12H  . warfarin  5 mg Oral ONCE-1800  . Warfarin - Pharmacist Dosing Inpatient   Does not apply Q24H    Allergies: No Known Allergies  History   Social History  . Marital Status: Married    Spouse Name: N/A    Number of Children: N/A  . Years of Education: N/A   Occupational History  . Not on file.   Social History Main Topics  . Smoking status: Never Smoker   . Smokeless tobacco: Never Used  . Alcohol Use: No  . Drug Use: No  . Sexual Activity: Yes   Other Topics Concern  . Not on file   Social History Narrative  . No narrative on file     Family History  Problem Relation Age of Onset  . Cancer Brother 18    hodgekins lymphoma  . Heart disease Maternal Grandfather   . Diabetes Paternal Grandmother   . Heart disease Mother     artial flutter     ROS:  Please see the history of present illness.     All other systems reviewed and negative.    Physical Exam: Blood pressure 89/54, pulse 78, temperature 97.5 F (36.4 C), temperature source Oral, resp. rate 18, height 6\' 4"  (1.93 m), weight 377 lb 10.4 oz (171.3 kg), SpO2 100.00%. General: Well developed, morbidly obese age appearing African American male in no acute distress. Head: Normocephalic, atraumatic, sclera non-icteric, no xanthomas, nares are without discharge. EENT: normal Lymph Nodes:  none Back: without scoliosis/kyphosis  no CVA tendersness Neck: Negative for carotid bruits. JVD not . Lu discerniblengs: Clear bilaterally to auscultation without wheezes, rales, or rhonchi. Breathing is unlabored. Heart: Irregularly irregular rate and rhythm without significant  murmur , rubs, or gallops appreciated. Abdomen: Soft, non-tender, non-distended with normoactive bowel sounds. No hepatomegaly. No rebound/guarding. No obvious abdominal masses. Msk:  Strength and tone appear normal for age. Extremities: No clubbing or cyanosis. No * edema.  Distal pedal pulses are 2+ and equal bilaterally. Skin: Warm and Dry Neuro: Alert and oriented X 3. CN III-XII intact Grossly normal sensory and motor function . Psych:  Responds to questions appropriately with a normal affect.      Labs: Cardiac Enzymes  Recent Labs  12/27/13 0141 12/27/13 0603 12/27/13 1157  TROPONINI <0.30 <0.30 <0.30   CBC Lab Results  Component Value Date   WBC 4.1 12/28/2013   HGB 13.5 12/28/2013   HCT 42.3 12/28/2013   MCV 86.9 12/28/2013   PLT 259 12/28/2013   PROTIME:  Recent Labs  12/27/13 0141 12/28/13 0605  LABPROT  22.3* 23.5*  INR 1.96* 2.09*   Chemistry  Recent Labs Lab 12/27/13 0141 12/28/13 0605  NA 137 135*  K 3.6* 4.0  CL 99 103  CO2 24 21  BUN 16 11  CREATININE 0.94 0.81  CALCIUM 9.4 9.3  PROT 8.4*  --   BILITOT 0.7  --   ALKPHOS 50  --   ALT 21  --   AST 17  --   GLUCOSE 124* 111*   Lipids Lab Results  Component Value Date   CHOL  Value: 170        ATP III CLASSIFICATION:  <200     mg/dL   Desirable  161-096  mg/dL   Borderline High  >=045    mg/dL   High        40/98/1191   HDL 32* 03/20/2009   LDLCALC  Value: 116        Total Cholesterol/HDL:CHD Risk Coronary Heart Disease Risk Table                     Men   Women  1/2 Average Risk   3.4   3.3  Average Risk       5.0   4.4  2 X Average Risk   9.6   7.1  3 X Average Risk  23.4   11.0        Use the calculated Patient Ratio above and the CHD Risk Table to determine the patient's CHD Risk.        ATP III CLASSIFICATION (LDL):  <100     mg/dL   Optimal  478-295  mg/dL   Near or Above                    Optimal  130-159  mg/dL   Borderline  621-308  mg/dL   High  >657     mg/dL   Very High* 84/69/6295    TRIG 110 03/20/2009   BNP Pro B Natriuretic peptide (BNP)  Date/Time Value Ref Range Status  12/27/2013  1:41 AM 37.5  0 - 125 pg/mL Final  03/24/2009 12:09 PM 107.0* 0.0 - 100.0 pg/mL Final  03/24/2009  3:20 AM 129.0* 0.0 - 100.0 pg/mL Final  03/22/2009  7:06 PM 185.0* 0.0 - 100.0 pg/mL Final   Miscellaneous Lab Results  Component Value Date   DDIMER  Value: 1.85        AT THE INHOUSE ESTABLISHED CUTOFF VALUE OF 0.48 ug/mL FEU, THIS ASSAY HAS BEEN DOCUMENTED IN THE LITERATURE TO HAVE A SENSITIVITY AND NEGATIVE PREDICTIVE VALUE OF AT LEAST 98 TO 99%.  THE TEST RESULT SHOULD BE CORRELATED WITH AN ASSESSMENT OF THE CLINICAL PROBABILITY OF DVT / VTE.* 03/19/2009    Radiology/Studies:  Dg Chest Portable 1 View  12/27/2013   CLINICAL DATA:  Tachycardia  EXAM: PORTABLE CHEST - 1 VIEW  COMPARISON:  10/17/2012  FINDINGS: There is a dual-chamber pacer ICD from the left which has unchanged lead orientation. Mild cardiomegaly which is stable from previous. There is no edema, consolidation, effusion, or pneumothorax.  IMPRESSION: No active disease.   Electronically Signed   By: Tiburcio Pea M.D.   On: 12/27/2013 02:19    EKG: *atrial flutter typical with AA rate of 250   Assessment and Plan:  Atrial flutter/atrial fibrillation  Nonischemic cardiomyopathy  Congestive heart failure-chronic-systolic  Morbid obesity  Hypotension   Implantable defibrillator-dual-chamber-St. Jude  PTSD secondary to ICD shock-inappropriate  The patient was admitted  with atrial flutter and variable conduction with some one-to-one noted. It was associated with symptoms of dyspnea or so with the recollection of prior ICD shock for similar symptoms.  Review of his defibrillator demonstrates recurrent episodes also prolonged atrial fibrillation associated with significantly slower heart rates however. And strategies include ongoing anticoagulation and antiarrhythmic therapy for his atrial arrhythmia.  However,  given the fact that he has had inappropriate shocks for one-to-one atrial flutter I think it is reasonable to undertake ablation of his atrial flutter substrate realizing that he will be left with his atrial fibrillation problem but that this has been associated with significantly slower heart rates. The second will have a profound impact on his PTSD.  Hypotension has resulted in the holding of his outpatient heart failure medications   WE will stop his IV diltiazem and resume his beta blockers.  I will add digoxin. Will anticipate resuming his ACE inhibitors  Given his size, this will need to be undertaken with anesthesia support. Will anticipate DC cardioversion in the morning with subsequent discharge and outpatient scheduling  Sherryl Manges

## 2014-01-18 NOTE — Anesthesia Postprocedure Evaluation (Signed)
  Anesthesia Post-op Note  Patient: Danny Hinton  Procedure(s) Performed: Procedure(s): ATRIAL FLUTTER ABLATION (N/A)  Patient Location: Cath Lab  Anesthesia Type:General  Level of Consciousness: awake and alert   Airway and Oxygen Therapy: Patient Spontanous Breathing  Post-op Pain: none  Post-op Assessment: Post-op Vital signs reviewed, Patient's Cardiovascular Status Stable, Respiratory Function Stable, Patent Airway, No signs of Nausea or vomiting and Pain level controlled  Post-op Vital Signs: Reviewed and stable  Last Vitals:  Filed Vitals:   01/18/14 1601  BP: 108/48  Pulse: 77  Temp:   Resp: 18    Complications: No apparent anesthesia complications

## 2014-01-18 NOTE — Progress Notes (Signed)
IV saline locked. 

## 2014-01-19 ENCOUNTER — Encounter (HOSPITAL_COMMUNITY): Payer: Self-pay | Admitting: *Deleted

## 2014-01-19 DIAGNOSIS — I4892 Unspecified atrial flutter: Secondary | ICD-10-CM

## 2014-01-19 DIAGNOSIS — I428 Other cardiomyopathies: Secondary | ICD-10-CM | POA: Diagnosis not present

## 2014-01-19 DIAGNOSIS — Z95810 Presence of automatic (implantable) cardiac defibrillator: Secondary | ICD-10-CM | POA: Diagnosis not present

## 2014-01-19 DIAGNOSIS — I5022 Chronic systolic (congestive) heart failure: Secondary | ICD-10-CM | POA: Diagnosis not present

## 2014-01-19 MED ORDER — INFLUENZA VAC SPLIT QUAD 0.5 ML IM SUSY: 0.5000 mL | PREFILLED_SYRINGE | INTRAMUSCULAR | Status: DC

## 2014-01-19 MED ORDER — ACETAMINOPHEN 325 MG PO TABS: 650.0000 mg | ORAL_TABLET | ORAL | Status: DC | PRN

## 2014-01-19 NOTE — Op Note (Signed)
NAME:  Danny Hinton, Danny Hinton                   ACCOUNT NO.:  [id number]  MEDICAL RECORD NO.:  [id number]  LOCATION:  6C07C                        FACILITY:  [facility]  PHYSICIAN:  Duke Salvia, MD, FACCDATE OF BIRTH:  [birth year]  DATE OF PROCEDURE:  01/18/2014 DATE OF DISCHARGE:                              OPERATIVE REPORT   PREOPERATIVE DIAGNOSIS:  Atrial flutter, previously implanted defibrillator.  POSTOPERATIVE DIAGNOSIS:  Atrial flutter, previously implanted defibrillator.  PROCEDURE:  Invasive electrophysiological study with catheter ablation.  Catheter ablation; device interrogation reprogramming.  DESCRIPTION OF PROCEDURE:  Following obtaining informed consent, the patient was brought to the electrophysiology laboratory and placed on the fluoroscopic table in a supine position.  After routine prep and drape, the patient was submitted to general anesthesia under the care of Dr. Maple Hudson.  Arterial line monitoring was included.  Following the procedure, the patient was awakened, extubated and transferred to the holding area for catheter removal.  CATHETERS:  A 7-French dual decapolar catheter was inserted via the right femoral vein to mapping sites in the tricuspid annulus and the septum. An 8-French 10-mm deflectable tip ablation catheter was inserted to mapping sites in the posterior septal space as well as the right ventricle.  END-TIDAL RESULTS:  End-tidal surface electrocardiogram and basic intervals.  Initial:  Rhythm:  Sinus; RR interval 890 milliseconds; PR interval 194 milliseconds; P-wave duration 150 milliseconds; QRS duration 130 milliseconds; QT interval 416 milliseconds; EHV and AHV intervals were not measured as the patient had a previously implanted device. Final:  Rhythm:  Sinus:  Cycle length 905 milliseconds; PR interval 192 milliseconds; P-wave duration 161 milliseconds; QRS duration 130 milliseconds; and QT interval 424 milliseconds.  AV NODAL FUNCTION:  AV  Wenckebach was seen at less than 450 milliseconds. VA conduction was one-to-one at 650 milliseconds. No evidence of an accessory pathway was identified.  Ventricular response to stimulation was normal for ventricular stimulation as described.  Because of the patient's size, it was elected to try to do the patient's procedure with minimal catheterization.  We thus elected to use only the right side and to use an isthmus halo catheter as well as an ablation catheter realizing that the patient had a previously implanted pacemaker.  Septal spacing allowed for distinction of the atrial signal and we were able to accomplish an A1-A2 interval of 110 milliseconds and a stim A2 interval of 169 milliseconds.  RF time:  A total of 6 minutes and 44 seconds of RF energy was applied.  Fluoroscopy time:  A total of 10 minutes of fluoroscopy time was utilized.  IMPRESSION: 1. Normal sinus function. 2. Abnormal atrial function with a history of atrial fibrillation and     atrial flutter, which have been associated with one-to-one     conduction and inappropriate implantable cardioverter-defibrillator     discharge. 3. Normal atrioventricular nodal function. 4. Normal His-Purkinje system function. 5. No accessory pathway. 6. Normal ventricular response to stimulation as described.  SUMMARY:  In conclusion, the results of electrophysiological testing confirmed cavotricuspid isthmus conduction and this was submitted to catheter ablation with the development of cavotricuspid conduction block bidirectionally.  The patient tolerated the procedure without apparent  complication.  The patient's device was interrogated and reprogrammed during the procedure to prevent inappropriate therapy and then was reactivated.  The patient tolerated the procedure without apparent complication.     Duke Salvia, MD, [facility]     SCK/MEDQ  D:  01/18/2014  T:  01/19/2014  Job:  [id number]

## 2014-01-19 NOTE — Progress Notes (Signed)
Patient discharged to home, ambulatory. Will have influenza vaccine at PCP at next visit this month. Preferred to go to PCP. (Vaccine not available from pharmacy at this time.

## 2014-01-19 NOTE — Discharge Summary (Signed)
ELECTROPHYSIOLOGY PROCEDURE DISCHARGE SUMMARY    Patient ID: Danny E Bleiler,  MRN: [id number], DOB/AGE: Dec 07, 1980 33 y.o.  Admit date: 01/18/2014 Discharge date: 01/19/2014  Primary Care Physician: Rudi Heap, MD Primary Cardiologist: Croitoru Electrophysiologist: Graciela Husbands  Primary Discharge Diagnosis:  Atrial flutter status post ablation this admission  Secondary Discharge Diagnosis:  1.  Non ischemic cardiomyopathy s/p STJ dual chamber ICD implantation 2.  Chronic systolic heart failure 3.  Atrial fibrillation 4.  Chronic anticoagulation with Warfarin (followed by [facility])  No Known Allergies   Procedures This Admission: 1.  Electrophysiology study and radiofrequency catheter ablation of atrial flutter on 01-18-14 by Dr Graciela Husbands.  This study demonstrated isthmus dependent atrial flutter with successful creation of bidirectional isthmus block. There were no early apparent complications.   Brief HPI: Danny Hinton is a 33 y.o. male with a past medical history as outlined above.  He has had both atrial fibrillation and atrial flutter identified through device interrogation and undergone cardioversions (most recently 12-29-13).  His atrial flutter has been associated with 1:1 conduction resulting in inappropriate ICD discharges.  Risks, benefits, and alternatives to ablation were reviewed with the patient who wished to proceed.   Hospital Course:  The patient was admitted and underwent ablation of atrial flutter with details as outlined above.  He was monitored on telemetry overnight which demonstrated sinus rhythm with occasional atrial pacing.  His groin was without complication.  He was evaluated by Dr Ladona Ridgel and considered stable for discharge to home.  He has follow-up with [facility] next week for INR check.  He will be seen by Dr Graciela Husbands in 4 weeks for post ablation follow-up.   Discharge Vitals: Blood pressure 113/52, pulse 62, temperature 99 F (37.2 C), temperature source Oral, resp. rate  19, height  (1.956 m), weight 370 lb (167.831 kg), SpO2 96.00%.  Labs:   Lab Results  Component Value Date   WBC 4.0 01/11/2014   HGB 12.9* 01/11/2014   HCT 39.5 01/11/2014   MCV 83.1 01/11/2014   PLT 301.0 01/11/2014   No results found for this basename: NA, K, CL, CO2, BUN, CREATININE, CALCIUM, LABALBU, PROT, BILITOT, ALKPHOS, ALT, AST, GLUCOSE,  in the last 168 hours  Discharge Medications:    Medication List    ASK your doctor about these medications       aspirin EC 81 MG tablet  Take 81 mg by mouth daily.     azelastine 0.1 % nasal spray  Commonly known as:  ASTELIN  Place 2 sprays into both nostrils at bedtime. Use in each nostril as directed     carvedilol 12.5 MG tablet  Commonly known as:  COREG  Take 1 tablet (12.5 mg total) by mouth 2 (two) times daily with a meal.     clobetasol cream 0.05 %  Commonly known as:  TEMOVATE  Apply 1 application topically 2 (two) times daily as needed (for eczema).     digoxin 0.125 MG tablet  Commonly known as:  LANOXIN  Take 0.125 mg by mouth daily.     fluticasone 50 MCG/ACT nasal spray  Commonly known as:  FLONASE  Place 1 spray into both nostrils at bedtime.     furosemide 40 MG tablet  Commonly known as:  LASIX  Take 40 mg by mouth daily as needed for fluid.     lisinopril 20 MG tablet  Commonly known as:  PRINIVIL,ZESTRIL  Take 20 mg by mouth 2 (two) times daily.  spironolactone 25 MG tablet  Commonly known as:  ALDACTONE  Take 25 mg by mouth daily.     warfarin 5 MG tablet  Commonly known as:  COUMADIN  Take 5 mg by mouth every evening.        Disposition:     Duration of Discharge Encounter: less than 30 minutes including physician time.  Signed,  Leonia Reeves.D.

## 2014-01-25 ENCOUNTER — Telehealth: Payer: Self-pay | Admitting: Cardiology

## 2014-01-25 NOTE — Telephone Encounter (Signed)
Pt called and stated he had surgery and wanted to make sure his device was fine. He sent remote transmission. No alerts and no episodes. Pt verbalized understanding.

## 2014-01-31 ENCOUNTER — Ambulatory Visit (INDEPENDENT_AMBULATORY_CARE_PROVIDER_SITE_OTHER): Payer: Medicare Other | Admitting: Pharmacist

## 2014-01-31 DIAGNOSIS — Z7901 Long term (current) use of anticoagulants: Secondary | ICD-10-CM

## 2014-01-31 DIAGNOSIS — I480 Paroxysmal atrial fibrillation: Secondary | ICD-10-CM

## 2014-01-31 DIAGNOSIS — I4891 Unspecified atrial fibrillation: Secondary | ICD-10-CM | POA: Diagnosis not present

## 2014-01-31 LAB — POCT INR: INR: 3.2

## 2014-01-31 MED ORDER — FUROSEMIDE 40 MG PO TABS: 40.0000 mg | ORAL_TABLET | Freq: Every day | ORAL | Status: DC | PRN

## 2014-01-31 MED ORDER — CARVEDILOL 12.5 MG PO TABS: 12.5000 mg | ORAL_TABLET | Freq: Two times a day (BID) | ORAL | Status: DC

## 2014-01-31 MED ORDER — WARFARIN SODIUM 5 MG PO TABS: 5.0000 mg | ORAL_TABLET | Freq: Every evening | ORAL | Status: DC

## 2014-01-31 MED ORDER — LISINOPRIL 20 MG PO TABS: 20.0000 mg | ORAL_TABLET | Freq: Two times a day (BID) | ORAL | Status: DC

## 2014-01-31 NOTE — Patient Instructions (Signed)
Anticoagulation Dose Instructions as of 01/31/2014     Danny Hinton Tue Wed Thu Fri Sat   New Dose 5 mg 5 mg 5 mg 5 mg 2.5 mg 5 mg 5 mg    Description       Starting today - September 24th decrease dose to warfarin 5mg  1/2 tablet on thursdays and 1 tablet all other days.      INR was 3.2 today

## 2014-02-13 DIAGNOSIS — Z23 Encounter for immunization: Secondary | ICD-10-CM | POA: Diagnosis not present

## 2014-02-19 ENCOUNTER — Ambulatory Visit: Payer: Medicare Other

## 2014-02-19 ENCOUNTER — Encounter: Payer: Self-pay | Admitting: *Deleted

## 2014-02-21 ENCOUNTER — Encounter: Payer: Self-pay | Admitting: Internal Medicine

## 2014-02-21 ENCOUNTER — Telehealth: Payer: Self-pay | Admitting: Internal Medicine

## 2014-02-21 ENCOUNTER — Ambulatory Visit (INDEPENDENT_AMBULATORY_CARE_PROVIDER_SITE_OTHER): Payer: Medicare Other | Admitting: Internal Medicine

## 2014-02-21 VITALS — BP 100/60 | HR 67 | Ht 77.0 in | Wt 360.2 lb

## 2014-02-21 DIAGNOSIS — Z95810 Presence of automatic (implantable) cardiac defibrillator: Secondary | ICD-10-CM

## 2014-02-21 DIAGNOSIS — I5022 Chronic systolic (congestive) heart failure: Secondary | ICD-10-CM

## 2014-02-21 DIAGNOSIS — I428 Other cardiomyopathies: Secondary | ICD-10-CM

## 2014-02-21 DIAGNOSIS — I4892 Unspecified atrial flutter: Secondary | ICD-10-CM | POA: Diagnosis not present

## 2014-02-21 DIAGNOSIS — I429 Cardiomyopathy, unspecified: Secondary | ICD-10-CM | POA: Diagnosis not present

## 2014-02-21 DIAGNOSIS — Z9889 Other specified postprocedural states: Secondary | ICD-10-CM

## 2014-02-21 DIAGNOSIS — Z8679 Personal history of other diseases of the circulatory system: Secondary | ICD-10-CM

## 2014-02-21 LAB — MDC_IDC_ENUM_SESS_TYPE_INCLINIC
Brady Statistic RV Percent Paced: 0.06 %
Date Time Interrogation Session: 20151015161901
HighPow Impedance: 47 Ohm
Implantable Pulse Generator Serial Number: 621444
Lead Channel Impedance Value: 450 Ohm
Lead Channel Pacing Threshold Amplitude: 0.75 V
Lead Channel Pacing Threshold Amplitude: 0.75 V
Lead Channel Pacing Threshold Amplitude: 1.75 V
Lead Channel Pacing Threshold Pulse Width: 0.5 ms
Lead Channel Sensing Intrinsic Amplitude: 11.7 mV
Lead Channel Sensing Intrinsic Amplitude: 3.4 mV
Lead Channel Setting Pacing Amplitude: 3.5 V
MDC IDC MSMT BATTERY REMAINING LONGEVITY: 52.8 mo
MDC IDC MSMT LEADCHNL RA PACING THRESHOLD AMPLITUDE: 1.75 V
MDC IDC MSMT LEADCHNL RA PACING THRESHOLD PULSEWIDTH: 0.9 ms
MDC IDC MSMT LEADCHNL RA PACING THRESHOLD PULSEWIDTH: 0.9 ms
MDC IDC MSMT LEADCHNL RV IMPEDANCE VALUE: 450 Ohm
MDC IDC MSMT LEADCHNL RV PACING THRESHOLD PULSEWIDTH: 0.5 ms
MDC IDC SET LEADCHNL RV PACING AMPLITUDE: 2 V
MDC IDC SET LEADCHNL RV PACING PULSEWIDTH: 0.5 ms
MDC IDC SET LEADCHNL RV SENSING SENSITIVITY: 0.5 mV
MDC IDC SET ZONE DETECTION INTERVAL: 250 ms
MDC IDC STAT BRADY RA PERCENT PACED: 37 %
Zone Setting Detection Interval: 285 ms
Zone Setting Detection Interval: 400 ms

## 2014-02-21 MED ORDER — DIGOXIN 125 MCG PO TABS: 0.1250 mg | ORAL_TABLET | ORAL | Status: DC

## 2014-02-21 MED ORDER — CITALOPRAM HYDROBROMIDE 20 MG PO TABS: 20.0000 mg | ORAL_TABLET | Freq: Every day | ORAL | Status: DC

## 2014-02-21 NOTE — Patient Instructions (Addendum)
Your physician has recommended you make the following change in your medication:  1) DECREASE Digoxin to every other day 2) START Celexa 20 mg daily  Keep your appointment with Dr. Royann Shivers on 10/27  No follow up is needed at this time with Dr. Graciela Husbands. He will see you on an as needed basis.  Your ejection fraction is 20-25%

## 2014-02-21 NOTE — Telephone Encounter (Signed)
New message ° ° ° ° ° ° °Did we get patient's remote transmission? °

## 2014-02-21 NOTE — Progress Notes (Signed)
Patient Care Team: Ernestina Penna, MD as PCP - General (Family Medicine)   HPI  Danny Hinton is a 33 y.o. male Seen in followup for atrial flutter for which he underwent ablation of a month ago he had a history of antecedent atrial fibrillation; however, his atrial flutter was associated with 1:1 conduction and inappropriate ICD discharge.  Marland Kitchen He also has a history is cardiomyopathy with a previously implanted dual-chamber ICD.  He is struggling with the fear of recurrent ICD discharges. Functional status is stable  Past Medical History  Diagnosis Date  . Chronic systolic congestive heart failure     a. suspected NICM - dx 2010. EF 15% by TEE, 10-20% by echo at that time. b. s/p St. Jude AICD 2011. c. Echo 01/2010: mod dilated LV, EF 30%, mod aortic root dilitation, no significant valvular disease.  Marland Kitchen Hypertension   . Eczema   . PAF (paroxysmal atrial fibrillation)   . Automatic implantable cardioverter-defibrillator in situ     a. St Jude in 2011.  Marland Kitchen Chronic anticoagulation   . Nonischemic cardiomyopathy   . Eczema   . Paroxysmal atrial flutter   . Mobitz type 2 second degree atrioventricular block     a. During sleeping hours in 2010 suspected due to ? OSA.  . Morbid obesity     Past Surgical History  Procedure Laterality Date  . Cardiac defibrillator placement  02/17/10    St. Jude Medical 45DR, model number [id number], serial number [id number]  . Tooth extraction N/A 10/23/2012    Procedure: EXTRACTION TEETH 1, 16, 17, 30, 31;  Surgeon: [name], DDS;  Location: [facility] OR;  Service: Oral Surgery;  Laterality: N/A;  . Cardioversion N/A 12/29/2013    Procedure: CARDIOVERSION;  Surgeon: Duke Salvia, MD;  Location: [facility] OR;  Service: Cardiovascular;  Laterality: N/A;    Current Outpatient Prescriptions  Medication Sig Dispense Refill  . aspirin EC 81 MG tablet Take 81 mg by mouth daily.      Marland Kitchen azelastine (ASTELIN) 0.1 % nasal spray Place 2 sprays into both nostrils at  bedtime. Use in each nostril as directed      . carvedilol (COREG) 12.5 MG tablet Take 1 tablet (12.5 mg total) by mouth 2 (two) times daily with a meal.  180 tablet  0  . clobetasol cream (TEMOVATE) 0.05 % Apply 1 application topically 2 (two) times daily as needed (for eczema).  30 g  5  . digoxin (LANOXIN) 0.125 MG tablet Take 0.125 mg by mouth daily.      . fluticasone (FLONASE) 50 MCG/ACT nasal spray Place 1 spray into both nostrils at bedtime.      . furosemide (LASIX) 40 MG tablet Take 1 tablet (40 mg total) by mouth daily as needed for fluid.  90 tablet  0  . lisinopril (PRINIVIL,ZESTRIL) 20 MG tablet Take 1 tablet (20 mg total) by mouth 2 (two) times daily.  180 tablet  0  . spironolactone (ALDACTONE) 25 MG tablet Take 25 mg by mouth daily.      Marland Kitchen warfarin (COUMADIN) 5 MG tablet Take 1 tablet (5 mg total) by mouth every evening.  90 tablet  0   No current facility-administered medications for this visit.    No Known Allergies  Review of Systems negative except from HPI and PMH  Physical Exam BP 100/60  Pulse 67  Ht 6\' 5"  (1.956 m)  Wt 360 lb 3.2 oz (163.386 kg)  BMI 42.70 kg/m2 Well developed and well nourished in no acute distress HENT normal E scleral and icterus clear Neck Supple JVP flat; carotids brisk and full Clear to ausculation  Regular rate and rhythm, no murmurs gallops or rub Soft with active bowel sounds No clubbing cyanosis  Edema Alert and oriented, grossly normal motor and sensory function Skin Warm and Dry    Assessment and  Plan  Atrial flutter status post ablation  Inappropriate ICD discharge is associated with atrial flutter with one-to-one conduction  PTSD  Nonischemic cardiomyopathy  Atrial fibrillation  We will continue him on his current medications. He would like to come off of digoxin. I am relatively fine with that. We will decrease his dose right now to every other day. In addition, we will begin him on Celexa for PTSD. I would  anticipate therapy for 6-12 months.  Is euvolemic and functionally stable. We will followup with Dr. Mitchell HeirMCr and we will see her again as needed

## 2014-02-21 NOTE — Telephone Encounter (Signed)
LMOVM stating we did receive his transmission. Also let pt know his remotes are automatic as long as Merlin stays plugged in.

## 2014-03-04 ENCOUNTER — Telehealth: Payer: Self-pay | Admitting: Cardiovascular Disease

## 2014-03-04 NOTE — Telephone Encounter (Deleted)
Spoke this

## 2014-03-04 NOTE — Telephone Encounter (Signed)
Pt called in stating that recently when he wakes up in the morning his left arm has been tingling like its asleep and it takes about an hour for him to regain feeling. He also stated that Dr. Graciela Husbands has taken him off of Digoxin and he would like to know if that is a side effect. Please call  Thanks

## 2014-03-04 NOTE — Telephone Encounter (Signed)
Spoke to patient.  he states since Friday, he wakes up with left arm tingling it last about hour or so. No chest pain No tenderness, or redness, swelling in the area. Patient states he did not left anything heavy, , RN informed the tingling is probably not coming from medication being stopped. Recommend contacting his primary. Patient states he has an appointment with Dr Salena Saner tomorrow, RN informed him to discuss at appointment.

## 2014-03-05 ENCOUNTER — Ambulatory Visit (INDEPENDENT_AMBULATORY_CARE_PROVIDER_SITE_OTHER): Payer: Medicare Other | Admitting: Cardiovascular Disease

## 2014-03-05 ENCOUNTER — Encounter: Payer: Self-pay | Admitting: Cardiovascular Disease

## 2014-03-05 VITALS — BP 100/64 | HR 69 | Resp 16 | Ht 77.0 in | Wt 356.8 lb

## 2014-03-05 DIAGNOSIS — I4892 Unspecified atrial flutter: Secondary | ICD-10-CM

## 2014-03-05 DIAGNOSIS — I480 Paroxysmal atrial fibrillation: Secondary | ICD-10-CM | POA: Diagnosis not present

## 2014-03-05 DIAGNOSIS — I10 Essential (primary) hypertension: Secondary | ICD-10-CM

## 2014-03-05 DIAGNOSIS — Z7901 Long term (current) use of anticoagulants: Secondary | ICD-10-CM

## 2014-03-05 DIAGNOSIS — I429 Cardiomyopathy, unspecified: Secondary | ICD-10-CM

## 2014-03-05 DIAGNOSIS — I5022 Chronic systolic (congestive) heart failure: Secondary | ICD-10-CM | POA: Diagnosis not present

## 2014-03-05 DIAGNOSIS — I428 Other cardiomyopathies: Secondary | ICD-10-CM

## 2014-03-05 DIAGNOSIS — Z95810 Presence of automatic (implantable) cardiac defibrillator: Secondary | ICD-10-CM

## 2014-03-05 MED ORDER — ALPRAZOLAM 1 MG PO TABS: 1.0000 mg | ORAL_TABLET | Freq: Every day | ORAL | Status: DC | PRN

## 2014-03-05 NOTE — Patient Instructions (Addendum)
A new Rx has been sent to your pharmacy for Xanax to use as needed for anxiety.  Remote monitoring is used to monitor your  ICD from home. This monitoring reduces the number of office visits required to check your device to one time per year. It allows [place] to keep an eye on the functioning of your device to ensure it is working properly. You are scheduled for a device check from home on 06-06-2014. You may send your transmission at any time that day. If you have a wireless device, the transmission will be sent automatically. After your physician reviews your transmission, you will receive a postcard with your next transmission date.  Your physician recommends that you schedule a follow-up appointment in:6 months with Dr.Croitoru with device check.

## 2014-03-06 ENCOUNTER — Ambulatory Visit: Payer: Self-pay

## 2014-03-08 ENCOUNTER — Encounter: Payer: Self-pay | Admitting: Cardiovascular Disease

## 2014-03-08 NOTE — Progress Notes (Signed)
Patient ID: Danny Hinton, male   DOB: 21-Apr-1981, 33 y.o.   MRN: [id number]      Reason for office visit Systolic chronic heart failure, paroxysmal atrial fibrillation, paroxysmal atrial flutter status post ablation, nonischemic cardiomyopathy  Danny had been unnecessary flurry of defibrillator shocks for atrial flutter with one-to-one conduction. He underwent flutter ablation roughly 1 month ago. He has had paroxysmal atrial fibrillation but this has not been associated with similar problems with rapid ventricular rates or inappropriate defibrillator shocks. He has well compensated heart failure (NYHA functional class I) and is usually euvolemic. She takes a loop diuretic only once every 3 or 4 days if he eats something saltier. He very rarely has edema. He denies shortness of breath or chest pain. He has occasional orthostatic dizziness. He just saw Dr. Berton Mount who checked his defibrillator on October 15. The findings were normal. He has some numbness of his left arm when he wakes up in the morning which sounds like nerve compression.  His only real complaint is the anxiety related to the defibrillator shocks. I think he clearly has posttraumatic stress and he believes he is had some anxiety attacks. He has noticed his heart rate pick up and he becomes very uneasy and agitated. This invariably happens when he is alone. If somebody comes into the house, within a matter of minutes everything settles down,  which is why he thinks it is anxiety.  He has also read about the potential side effects of digoxin and is concerned about taking this medication.  No Known Allergies  Current Outpatient Prescriptions  Medication Sig Dispense Refill  . aspirin EC 81 MG tablet Take 81 mg by mouth daily.      Marland Kitchen azelastine (ASTELIN) 0.1 % nasal spray Place 2 sprays into both nostrils at bedtime. Use in each nostril as directed      . carvedilol (COREG) 12.5 MG tablet Take 1 tablet (12.5 mg total) by mouth 2 (two)  times daily with a meal.  180 tablet  0  . citalopram (CELEXA) 20 MG tablet Take 1 tablet (20 mg total) by mouth daily.  30 tablet  3  . clobetasol cream (TEMOVATE) 0.05 % Apply 1 application topically 2 (two) times daily as needed (for eczema).  30 g  5  . digoxin (LANOXIN) 0.125 MG tablet Take 1 tablet (0.125 mg total) by mouth every other day.  15 tablet  5  . fluticasone (FLONASE) 50 MCG/ACT nasal spray Place 1 spray into both nostrils at bedtime.      . furosemide (LASIX) 40 MG tablet Take 1 tablet (40 mg total) by mouth daily as needed for fluid.  90 tablet  0  . lisinopril (PRINIVIL,ZESTRIL) 20 MG tablet Take 1 tablet (20 mg total) by mouth 2 (two) times daily.  180 tablet  0  . spironolactone (ALDACTONE) 25 MG tablet Take 25 mg by mouth daily.      Marland Kitchen warfarin (COUMADIN) 5 MG tablet Take 1 tablet (5 mg total) by mouth every evening.  90 tablet  0  . ALPRAZolam (XANAX) 1 MG tablet Take 1 tablet (1 mg total) by mouth daily as needed for anxiety.  15 tablet  0   No current facility-administered medications for this visit.    Past Medical History  Diagnosis Date  . Chronic systolic congestive heart failure     a. suspected NICM - dx 2010. EF 15% by TEE, 10-20% by echo at that time. b. s/p St. Jude AICD  2011. c. Echo 01/2010: mod dilated LV, EF 30%, mod aortic root dilitation, no significant valvular disease.  Marland Kitchen. Hypertension   . Eczema   . PAF (paroxysmal atrial fibrillation)   . Automatic implantable cardioverter-defibrillator in situ     a. St Jude in 2011.  Marland Kitchen. Chronic anticoagulation   . Nonischemic cardiomyopathy   . Eczema   . Paroxysmal atrial flutter   . Mobitz type 2 second degree atrioventricular block     a. During sleeping hours in 2010 suspected due to ? OSA.  . Morbid obesity     Past Surgical History  Procedure Laterality Date  . Cardiac defibrillator placement  02/17/10    St. Jude Medical 45DR, model number [id number], serial number [id number]  . Tooth extraction N/A  10/23/2012    Procedure: EXTRACTION TEETH 1, 16, 17, 30, 31;  Surgeon: Georgia LopesScott M Jensen, DDS;  Location: [facility] OR;  Service: Oral Surgery;  Laterality: N/A;  . Cardioversion N/A 12/29/2013    Procedure: CARDIOVERSION;  Surgeon: Duke SalviaSteven C Klein, MD;  [facility];  Service: Cardiovascular;  Laterality: N/A;    Family History  Problem Relation Age of Onset  . Cancer Brother 18    hodgekins lymphoma  . Heart disease Maternal Grandfather   . Diabetes Paternal Grandmother   . Heart disease Mother     artial flutter    History   Social History  . Marital Status: Married    Spouse Name: N/A    Number of Children: N/A  . Years of Education: N/A   Occupational History  . Not on file.   Social History Main Topics  . Smoking status: Never Smoker   . Smokeless tobacco: Never Used  . Alcohol Use: No  . Drug Use: No  . Sexual Activity: Yes   Other Topics Concern  . Not on file   Social History Narrative  . No narrative on file    Review of systems: Anxiety The patient specifically denies any chest pain at rest or with exertion, dyspnea at rest or with exertion, orthopnea, paroxysmal nocturnal dyspnea, syncope, palpitations, focal neurological deficits, intermittent claudication, lower extremity edema, unexplained weight gain, cough, hemoptysis or wheezing.  The patient also denies abdominal pain, nausea, vomiting, dysphagia, diarrhea, constipation, polyuria, polydipsia, dysuria, hematuria, frequency, urgency, abnormal bleeding or bruising, fever, chills, unexpected weight changes, mood swings, change in skin or hair texture, change in voice quality, auditory or visual problems, allergic reactions or rashes, new musculoskeletal complaints other than usual "aches and pains".  PHYSICAL EXAM BP 100/64  Pulse 69  Resp 16  Ht 6\' 5"  (1.956 m)  Wt 356 lb 12.8 oz (161.843 kg)  BMI 42.30 kg/m2 General: Alert, oriented x3, no distress, morbidly obese  Head: no evidence of trauma, PERRL,  EOMI, no exophtalmos or lid lag, no myxedema, no xanthelasma; normal ears, nose and oropharynx  Neck: normal jugular venous pulsations and no hepatojugular reflux; brisk carotid pulses without delay and no carotid bruits  Chest: clear to auscultation, no signs of consolidation by percussion or palpation, normal fremitus, symmetrical and full respiratory excursions  Cardiovascular: Unable to define position and quality of the apical impulse, regular rhythm, normal first and second heart sounds, no murmurs, rubs or gallops  Abdomen: no tenderness or distention, no masses by palpation, no abnormal pulsatility or arterial bruits, normal bowel sounds, no hepatosplenomegaly  Extremities: no clubbing, cyanosis or edema; 2+ radial, ulnar and brachial pulses bilaterally; 2+ right femoral, posterior tibial and dorsalis pedis pulses; 2+  left femoral, posterior tibial and dorsalis pedis pulses; no subclavian or femoral bruits  Neurological: grossly nonfocal   EKG: Normal sinus rhythm, borderline first-degree AV block, relatively high-voltage not really meeting criteria for LVH. No repolarization of her maladies. QTC 390 ms  Lipid Panel     Component Value Date/Time   CHOL  Value: 170        ATP III CLASSIFICATION:  <200     mg/dL   Desirable  093-818  mg/dL   Borderline High  >=299    mg/dL   High        37/16/9678 0410   TRIG 110 03/20/2009 0410   HDL 32* 03/20/2009 0410   CHOLHDL 5.3 03/20/2009 0410   VLDL 22 03/20/2009 0410   LDLCALC  Value: 116        Total Cholesterol/HDL:CHD Risk Coronary Heart Disease Risk Table                     Men   Women  1/2 Average Risk   3.4   3.3  Average Risk       5.0   4.4  2 X Average Risk   9.6   7.1  3 X Average Risk  23.4   11.0        Use the calculated Patient Ratio above and the CHD Risk Table to determine the patient's CHD Risk.        ATP III CLASSIFICATION (LDL):  <100     mg/dL   Optimal  938-101  mg/dL   Near or Above                    Optimal  130-159  mg/dL    Borderline  751-025  mg/dL   High  >852     mg/dL   Very High* 77/82/4235 0410    BMET    Component Value Date/Time   NA 135 01/11/2014 1302   K 3.9 01/11/2014 1302   CL 103 01/11/2014 1302   CO2 24 01/11/2014 1302   GLUCOSE 88 01/11/2014 1302   BUN 10 01/11/2014 1302   CREATININE 1.0 01/11/2014 1302   CREATININE 0.78 07/27/2012 1330   CALCIUM 9.0 01/11/2014 1302   GFRNONAA >90 12/29/2013 0244   GFRAA >90 12/29/2013 0244     ASSESSMENT AND PLAN  Chronic combined systolic and diastolic heart failure secondary to nonischemic cardiopathy Compensated, good functional status, euvolemic on intermittent low dose loop diuretic  and appropriate beta blocker and ACE inhibitor and aldosterone antagonist therapy. Blood pressure limits further medication titration. Now is also on digoxin which he may not need long-term. Most recent EF estimated at 20-25 %, after defibrillator shocks. Should probably reassess after a few months  Paroxysmal atrial flutter with 1:1 conduction Status post a flutter ablation. Stop digoxin if he does not have episodes of rapid ventricular response the next 3 months  Paroxysmal atrial fibrillation previously with good rate control. On warfarin therapy no history of stroke or other embolic events. No bleeding complications  Posttraumatic stress disorder After defibrillator discharges. I agree with his self-assessment that he probably has anxiety now. Suspect this will persist for several more months. Plan treatment with Celexa for 6-12 months, as started by Dr. Graciela Husbands  Meds ordered this encounter  Medications  . ALPRAZolam (XANAX) 1 MG tablet    Sig: Take 1 tablet (1 mg total) by mouth daily as needed for anxiety.    Dispense:  15 tablet  Refill:  0    Makar Slatter  Sanda Klein, MD, [facility] [phone] office [phone] pager

## 2014-03-15 ENCOUNTER — Other Ambulatory Visit: Payer: Self-pay | Admitting: Cardiovascular Disease

## 2014-03-15 MED ORDER — CARVEDILOL 12.5 MG PO TABS: 12.5000 mg | ORAL_TABLET | Freq: Two times a day (BID) | ORAL | Status: DC

## 2014-03-15 MED ORDER — LISINOPRIL 20 MG PO TABS: 20.0000 mg | ORAL_TABLET | Freq: Two times a day (BID) | ORAL | Status: DC

## 2014-03-15 MED ORDER — FUROSEMIDE 40 MG PO TABS: 40.0000 mg | ORAL_TABLET | Freq: Every day | ORAL | Status: DC | PRN

## 2014-03-15 NOTE — Telephone Encounter (Signed)
Lasix, Lisinopril, Carvedilol refilled.   Warfarin refill routed to Belenda Cruise, PharmD to advise

## 2014-03-15 NOTE — Telephone Encounter (Signed)
Patient needs 90 day refill for his Coreg, Lasix, Lisinopril and Coumadin to [facility] in [place], [place].  Patient states that Jordan Hawks has faxed the request but has not heard from [place].  Patient states that he is almost out of medication.

## 2014-03-15 NOTE — Telephone Encounter (Signed)
Patient advised to contact PCP office - who monitor warfarin - for refill. He voiced understanding.

## 2014-03-21 ENCOUNTER — Telehealth: Payer: Self-pay | Admitting: Cardiovascular Disease

## 2014-03-21 MED ORDER — CARVEDILOL 25 MG PO TABS: 25.0000 mg | ORAL_TABLET | Freq: Two times a day (BID) | ORAL | Status: DC

## 2014-03-21 NOTE — Telephone Encounter (Signed)
Pt says his Carvedilol was called in wrong. He said it should have been 25 mg instead of 12.5 mg. Please call pt first .

## 2014-03-21 NOTE — Telephone Encounter (Signed)
SPOKE TO PATIENT HE STATES HE HAS BEEN TAKING COREG 25 MG TWICE A DAY AND NOT 12.5 MG TWICE A DAY   RN REVIEWED PATIENT'S CHL CHART  PATIENT WAS ADMITTED TO HOSPITAL IN 12/2013. DISCHARGE SUMMARY STATES DECREASE OF COREG  TO 12.5 MG TWICE A DAY. PATIENT STATES HE WAS IN THE HOSPITAL AT THAT TIME AND THEY STARTED DIGIOXIN ,BUT ON DISCHARGE A PRESCRIPTION HE DID NOT RECEIVED AND HE CONTINUED WITH 25 MG TWICE A DAY. PATIENT STATES HE HAS A FEW DAYS OF MEDICATION AVAILABLE RN INFORMED PATIENT WILL DEFER TO DR CROITORU- WILL CALL BACK WITH THE ANSWER.

## 2014-03-21 NOTE — Telephone Encounter (Signed)
Please have him stay on 25 mg BID and call in new Rx.

## 2014-03-21 NOTE — Telephone Encounter (Signed)
Spoke to patient.  new rx sent to pharmacy 25 mg  Twice a day. E-sent prescription- patient aware.

## 2014-03-21 NOTE — Telephone Encounter (Signed)
Returning a call

## 2014-03-22 ENCOUNTER — Telehealth: Payer: Self-pay | Admitting: Pharmacist

## 2014-03-22 NOTE — Telephone Encounter (Signed)
protime due.   patient called and appt made for 03/25/14 at 11am

## 2014-03-25 ENCOUNTER — Ambulatory Visit (INDEPENDENT_AMBULATORY_CARE_PROVIDER_SITE_OTHER): Payer: Medicare Other | Admitting: Pharmacist

## 2014-03-25 DIAGNOSIS — I4891 Unspecified atrial fibrillation: Secondary | ICD-10-CM

## 2014-03-25 DIAGNOSIS — Z7901 Long term (current) use of anticoagulants: Secondary | ICD-10-CM

## 2014-03-25 DIAGNOSIS — I480 Paroxysmal atrial fibrillation: Secondary | ICD-10-CM | POA: Diagnosis not present

## 2014-03-25 LAB — POCT INR: INR: 3.8

## 2014-03-25 MED ORDER — WARFARIN SODIUM 5 MG PO TABS: ORAL_TABLET | ORAL | Status: DC

## 2014-03-25 MED ORDER — AMOXICILLIN-POT CLAVULANATE 875-125 MG PO TABS: 1.0000 | ORAL_TABLET | Freq: Two times a day (BID) | ORAL | Status: AC

## 2014-03-25 NOTE — Patient Instructions (Signed)
Anticoagulation Dose Instructions as of 03/25/2014      Danny Hinton Tue Wed Thu Fri Sat   New Dose 2.5 mg 5 mg 5 mg 5 mg 2.5 mg 5 mg 5 mg    Description        No warfarin today - Monday, November 16th.  Then decrease dose to 1/2 tablet on sundays and thursdays, 1 tablet all other days.       INR was 3.8 today

## 2014-03-26 ENCOUNTER — Telehealth: Payer: Self-pay | Admitting: Internal Medicine

## 2014-03-26 NOTE — Telephone Encounter (Signed)
Informed pt that transmission was received and there are no new episodes. He verbalized understanding.

## 2014-03-26 NOTE — Telephone Encounter (Signed)
New message         Pt would like to know if there has been any abnormal activity on his device

## 2014-04-02 ENCOUNTER — Telehealth: Payer: Self-pay | Admitting: *Deleted

## 2014-04-02 NOTE — Telephone Encounter (Signed)
OPEN IN ERROR 

## 2014-04-09 ENCOUNTER — Other Ambulatory Visit: Payer: Self-pay | Admitting: Cardiovascular Disease

## 2014-04-09 NOTE — Telephone Encounter (Signed)
LMTCB

## 2014-04-09 NOTE — Telephone Encounter (Signed)
Pt called wanting to know what the proper instructions are with taking his Spironolactone medication. He has been taking 1 tab a day but the bottle says take 1 1/2 tab a day. He is not sure when the directions were changed. Please call and advise  Thanks

## 2014-04-10 ENCOUNTER — Telehealth: Payer: Self-pay | Admitting: Cardiovascular Disease

## 2014-04-10 ENCOUNTER — Ambulatory Visit (INDEPENDENT_AMBULATORY_CARE_PROVIDER_SITE_OTHER): Payer: Medicare Other | Admitting: Pharmacist

## 2014-04-10 DIAGNOSIS — I4891 Unspecified atrial fibrillation: Secondary | ICD-10-CM | POA: Diagnosis not present

## 2014-04-10 DIAGNOSIS — I480 Paroxysmal atrial fibrillation: Secondary | ICD-10-CM

## 2014-04-10 DIAGNOSIS — Z7901 Long term (current) use of anticoagulants: Secondary | ICD-10-CM

## 2014-04-10 LAB — POCT INR: INR: 2.9

## 2014-04-10 MED ORDER — LISINOPRIL 20 MG PO TABS: 20.0000 mg | ORAL_TABLET | Freq: Two times a day (BID) | ORAL | Status: DC

## 2014-04-10 MED ORDER — SPIRONOLACTONE 25 MG PO TABS: 25.0000 mg | ORAL_TABLET | Freq: Every day | ORAL | Status: DC

## 2014-04-10 NOTE — Telephone Encounter (Signed)
Returned call to patient he stated he needed prescription for aldactone sent to pharmacy.Stated he takes aldactone 25 mg daily.Also needs refill for lisinopril.Refills sent to pharmacy.

## 2014-04-10 NOTE — Telephone Encounter (Signed)
This message came from the answering service: Please call him regarding his medicine.

## 2014-04-12 NOTE — Telephone Encounter (Signed)
Called pt and left message to call back.

## 2014-04-18 ENCOUNTER — Encounter (HOSPITAL_COMMUNITY): Payer: Self-pay | Admitting: Internal Medicine

## 2014-04-25 ENCOUNTER — Other Ambulatory Visit: Payer: Self-pay | Admitting: Cardiovascular Disease

## 2014-04-25 NOTE — Telephone Encounter (Signed)
Pt need a new prescription for Alprazolam 1 mg #15-Please call to [facility]-[phone].

## 2014-04-25 NOTE — Telephone Encounter (Signed)
Phoned in Rx for xanax 

## 2014-05-16 ENCOUNTER — Ambulatory Visit (INDEPENDENT_AMBULATORY_CARE_PROVIDER_SITE_OTHER): Payer: Medicare Other | Admitting: Pharmacist

## 2014-05-16 DIAGNOSIS — I480 Paroxysmal atrial fibrillation: Secondary | ICD-10-CM | POA: Diagnosis not present

## 2014-05-16 DIAGNOSIS — I4891 Unspecified atrial fibrillation: Secondary | ICD-10-CM | POA: Diagnosis not present

## 2014-05-16 DIAGNOSIS — Z7901 Long term (current) use of anticoagulants: Secondary | ICD-10-CM | POA: Diagnosis not present

## 2014-05-16 LAB — POCT INR: INR: 2.6

## 2014-05-16 NOTE — Patient Instructions (Signed)
Anticoagulation Dose Instructions as of 05/16/2014      Danny Hinton Tue Wed Thu Fri Sat   New Dose 2.5 mg 5 mg 5 mg 5 mg 5 mg 5 mg 2.5 mg    Description        Continue warfarin at current dose of 1/2 tablet on saturdays and sundays. take 1 tablet all other days.       INR was 2.6 today

## 2014-06-06 ENCOUNTER — Ambulatory Visit (INDEPENDENT_AMBULATORY_CARE_PROVIDER_SITE_OTHER): Payer: Medicare Other | Admitting: *Deleted

## 2014-06-06 ENCOUNTER — Encounter: Payer: Self-pay | Admitting: Cardiovascular Disease

## 2014-06-06 DIAGNOSIS — I429 Cardiomyopathy, unspecified: Secondary | ICD-10-CM | POA: Diagnosis not present

## 2014-06-06 DIAGNOSIS — I428 Other cardiomyopathies: Secondary | ICD-10-CM

## 2014-06-06 LAB — MDC_IDC_ENUM_SESS_TYPE_REMOTE
Lead Channel Impedance Value: 440 Ohm
Lead Channel Sensing Intrinsic Amplitude: 11.7 mV
Lead Channel Setting Pacing Amplitude: 2 V
Lead Channel Setting Pacing Amplitude: 3.5 V
Lead Channel Setting Pacing Pulse Width: 0.5 ms
Lead Channel Setting Sensing Sensitivity: 0.5 mV
MDC IDC MSMT LEADCHNL RA IMPEDANCE VALUE: 430 Ohm
MDC IDC MSMT LEADCHNL RA SENSING INTR AMPL: 3.4 mV
MDC IDC PG SERIAL: 621444
MDC IDC SET ZONE DETECTION INTERVAL: 285 ms
MDC IDC SET ZONE DETECTION INTERVAL: 400 ms
Zone Setting Detection Interval: 250 ms

## 2014-06-06 NOTE — Progress Notes (Signed)
Remote ICD transmission.   

## 2014-06-18 ENCOUNTER — Other Ambulatory Visit: Payer: Self-pay | Admitting: Internal Medicine

## 2014-06-19 ENCOUNTER — Encounter: Payer: Self-pay | Admitting: Cardiology

## 2014-06-25 ENCOUNTER — Other Ambulatory Visit: Payer: Self-pay | Admitting: Internal Medicine

## 2014-06-26 ENCOUNTER — Telehealth: Payer: Self-pay | Admitting: Family Medicine

## 2014-06-26 MED ORDER — CITALOPRAM HYDROBROMIDE 20 MG PO TABS: 20.0000 mg | ORAL_TABLET | Freq: Every day | ORAL | Status: DC

## 2014-06-26 NOTE — Telephone Encounter (Signed)
Patient states that [facility] told him celexa needed refills which he takes for PTSD.   It looks like Dr Clide Cliff might have printed Rx 06/18/14.   Called [facility] - they did not have Rx from 06/18/14. Rx called for #30 / Dr Christell Constant / will make appt with PCP at appt next week.

## 2014-06-28 NOTE — Telephone Encounter (Signed)
Patient should follow up with PCP for refills.

## 2014-06-28 NOTE — Telephone Encounter (Signed)
Message from Valrie Hart, [place] sent at 06/26/2014 8:34 AM EST -----         ----- Message from [employer] sent at 06/25/2014 3:33 PM EST -----        The demographic information from the pharmacy is:    Patient Name: Danny Hinton,Danny Hinton    Patient DOB: 07/13/1980    Patient Gender: Male    Address:     [address]     [place]     [place]     [id number]

## 2014-07-03 ENCOUNTER — Encounter (INDEPENDENT_AMBULATORY_CARE_PROVIDER_SITE_OTHER): Payer: Self-pay

## 2014-07-03 ENCOUNTER — Ambulatory Visit (INDEPENDENT_AMBULATORY_CARE_PROVIDER_SITE_OTHER): Payer: Medicare Other | Admitting: Pharmacist

## 2014-07-03 DIAGNOSIS — Z7901 Long term (current) use of anticoagulants: Secondary | ICD-10-CM

## 2014-07-03 DIAGNOSIS — I480 Paroxysmal atrial fibrillation: Secondary | ICD-10-CM

## 2014-07-03 DIAGNOSIS — I4891 Unspecified atrial fibrillation: Secondary | ICD-10-CM

## 2014-07-03 LAB — POCT INR: INR: 2.3

## 2014-07-03 NOTE — Patient Instructions (Signed)
Anticoagulation Dose Instructions as of 07/03/2014      Danny Hinton Tue Wed Thu Fri Sat   New Dose 2.5 mg 5 mg 5 mg 5 mg 5 mg 5 mg 2.5 mg    Description        Continue warfarin at current dose of 1/2 tablet on saturdays and sundays. take 1 tablet all other days.      INR was 2.3 today

## 2014-07-05 ENCOUNTER — Other Ambulatory Visit: Payer: Self-pay | Admitting: Cardiovascular Disease

## 2014-07-05 ENCOUNTER — Telehealth: Payer: Self-pay | Admitting: Cardiovascular Disease

## 2014-07-05 MED ORDER — AMOXICILLIN 500 MG PO TABS: 500.0000 mg | ORAL_TABLET | Freq: Two times a day (BID) | ORAL | Status: DC

## 2014-07-05 NOTE — Telephone Encounter (Signed)
Pt has an abscess tooth. The dentist says he can not see him until next week. He wants to know if Dr C would call him something in.

## 2014-07-05 NOTE — Telephone Encounter (Signed)
Returned call to patient he stated he has a abscess tooth.Stated he has appointment with dentist the end of next week.Stated he wanted to ask Dr.Croitoru if he would call in a antibiotic and a pain medication.Advised he will need to call PCP.Stated he already called PCP and he would not call in any medication.Advised to go to a Urgent Care.

## 2014-07-23 ENCOUNTER — Telehealth: Payer: Self-pay | Admitting: Internal Medicine

## 2014-07-23 NOTE — Telephone Encounter (Signed)
New msg        Pt sent transmission and wants to know if there were any alerts and if transmission was received.   Please return call.

## 2014-07-23 NOTE — Telephone Encounter (Signed)
Spoke w/pt to let know 3 episodes on 2-26 x 2 and 2-27. Episodes had no therapy--SVT. Answered all questions.

## 2014-07-31 ENCOUNTER — Other Ambulatory Visit: Payer: Self-pay | Admitting: Family Medicine

## 2014-08-03 ENCOUNTER — Other Ambulatory Visit: Payer: Self-pay | Admitting: Family Medicine

## 2014-08-14 ENCOUNTER — Ambulatory Visit (INDEPENDENT_AMBULATORY_CARE_PROVIDER_SITE_OTHER): Payer: Medicare Other | Admitting: Pharmacist

## 2014-08-14 DIAGNOSIS — Z7901 Long term (current) use of anticoagulants: Secondary | ICD-10-CM

## 2014-08-14 DIAGNOSIS — I4891 Unspecified atrial fibrillation: Secondary | ICD-10-CM

## 2014-08-14 DIAGNOSIS — I480 Paroxysmal atrial fibrillation: Secondary | ICD-10-CM

## 2014-08-14 LAB — POCT INR: INR: 2.4

## 2014-08-14 NOTE — Patient Instructions (Signed)
Anticoagulation Dose Instructions as of 08/14/2014      Danny Hinton Tue Wed Thu Fri Sat   New Dose 2.5 mg 5 mg 5 mg 5 mg 5 mg 5 mg 2.5 mg    Description        Continue warfarin at current dose of 1/2 tablet on saturdays and sundays. take 1 tablet all other days.

## 2014-08-18 ENCOUNTER — Other Ambulatory Visit: Payer: Self-pay | Admitting: Family Medicine

## 2014-08-19 ENCOUNTER — Encounter: Payer: Self-pay | Admitting: Internal Medicine

## 2014-08-19 ENCOUNTER — Ambulatory Visit (INDEPENDENT_AMBULATORY_CARE_PROVIDER_SITE_OTHER): Payer: Medicare Other | Admitting: Internal Medicine

## 2014-08-19 ENCOUNTER — Encounter: Payer: Medicare Other | Admitting: Internal Medicine

## 2014-08-19 ENCOUNTER — Telehealth: Payer: Self-pay | Admitting: *Deleted

## 2014-08-19 VITALS — BP 118/86 | HR 78 | Ht 77.0 in

## 2014-08-19 VITALS — BP 112/69 | HR 87 | Ht 77.0 in

## 2014-08-19 DIAGNOSIS — I484 Atypical atrial flutter: Secondary | ICD-10-CM

## 2014-08-19 DIAGNOSIS — I5022 Chronic systolic (congestive) heart failure: Secondary | ICD-10-CM | POA: Diagnosis not present

## 2014-08-19 DIAGNOSIS — I10 Essential (primary) hypertension: Secondary | ICD-10-CM

## 2014-08-19 DIAGNOSIS — I429 Cardiomyopathy, unspecified: Secondary | ICD-10-CM | POA: Diagnosis not present

## 2014-08-19 DIAGNOSIS — I4892 Unspecified atrial flutter: Secondary | ICD-10-CM | POA: Diagnosis not present

## 2014-08-19 DIAGNOSIS — I428 Other cardiomyopathies: Secondary | ICD-10-CM

## 2014-08-19 HISTORY — DX: Atypical atrial flutter: I48.4

## 2014-08-19 LAB — MDC_IDC_ENUM_SESS_TYPE_INCLINIC
Brady Statistic RA Percent Paced: 34 %
Brady Statistic RV Percent Paced: 0.16 %
Date Time Interrogation Session: 20160411165713
Implantable Pulse Generator Serial Number: 621444
Lead Channel Pacing Threshold Amplitude: 1.25 V
Lead Channel Pacing Threshold Pulse Width: 0.9 ms
Lead Channel Sensing Intrinsic Amplitude: 11.7 mV
Lead Channel Sensing Intrinsic Amplitude: 4 mV
Lead Channel Setting Pacing Amplitude: 2.5 V
Lead Channel Setting Pacing Pulse Width: 0.5 ms
Lead Channel Setting Sensing Sensitivity: 0.5 mV
MDC IDC MSMT BATTERY REMAINING LONGEVITY: 56.4 mo
MDC IDC MSMT LEADCHNL RV PACING THRESHOLD AMPLITUDE: 0.75 V
MDC IDC MSMT LEADCHNL RV PACING THRESHOLD PULSEWIDTH: 0.5 ms
MDC IDC SET LEADCHNL RA PACING AMPLITUDE: 2.5 V
MDC IDC SET ZONE DETECTION INTERVAL: 250 ms
MDC IDC SET ZONE DETECTION INTERVAL: 285 ms
Zone Setting Detection Interval: 400 ms

## 2014-08-19 NOTE — Telephone Encounter (Signed)
Pt called feeling heart beat in throat--transmission showed SVT/VT/AF episodes--per JA bring pt in for pace termination/pt wife driving pt to appointment/ will arrive 45 minutes to 1 hour//kwm

## 2014-08-19 NOTE — Progress Notes (Signed)
Electrophysiology Office Note   Date:  08/19/2014   ID:  Danny Hinton, DOB [birth year], MRN [id number]  PCP:  Rudi Heap, MD  Cardiologist:  Dr Royann Shivers Primary Electrophysiologist: Dr Graciela Husbands  Chief Complaint  Patient presents with  . Palpitations     History of Present Illness: Danny Hinton is a 34 y.o. male who presents today for electrophysiology evaluation.   The patient presents as an add on to EP clinic.  He reports doing very well recently.  This am, he was [place] hunting when he developed abrupt onset of tachypalpitations.  He sent a remote transmission to the device clinic and was documented to have atrial flutter at 390 msec.  He now presents for further evaluation.  Today, he denies symptoms of  chest pain, shortness of breath,  dizziness, presyncope, syncope, bleeding, or neurologic sequela. The patient is tolerating medications without difficulties and is otherwise without complaint today.    Past Medical History  Diagnosis Date  . Chronic systolic congestive heart failure     a. suspected NICM - dx 2010. EF 15% by TEE, 10-20% by echo at that time. b. s/p St. Jude AICD 2011. c. Echo 01/2010: mod dilated LV, EF 30%, mod aortic root dilitation, no significant valvular disease.  Marland Kitchen Hypertension   . Eczema   . PAF (paroxysmal atrial fibrillation)   . Automatic implantable cardioverter-defibrillator in situ     a. St Jude in 2011.  Marland Kitchen Chronic anticoagulation   . Nonischemic cardiomyopathy   . Eczema   . Paroxysmal atrial flutter     s/p ablation  . Mobitz type 2 second degree atrioventricular block     a. During sleeping hours in 2010 suspected due to ? OSA.  . Morbid obesity   . Atypical atrial flutter 08/19/14    pace terminated in device clinic.  AFL cycle length was 390 msec   Past Surgical History  Procedure Laterality Date  . Cardiac defibrillator placement  02/17/10    St. Jude Medical 45DR, model number [id number], serial number [id number]  . Tooth extraction  N/A 10/23/2012    Procedure: EXTRACTION TEETH 1, 16, 17, 30, 31;  Surgeon: [name], DDS;  Location: [facility] OR;  Service: Oral Surgery;  Laterality: N/A;  . Cardioversion N/A 12/29/2013    Procedure: CARDIOVERSION;  Surgeon: Duke Salvia, MD;  Location: [facility] OR;  Service: Cardiovascular;  Laterality: N/A;  . Atrial flutter ablation N/A 01/18/2014    Procedure: ATRIAL FLUTTER ABLATION;  Surgeon: Duke Salvia, MD;  Location: [facility] CATH [facility];  Service: Cardiovascular;  Laterality: N/A;     Current Outpatient Prescriptions  Medication Sig Dispense Refill  . ALPRAZolam (XANAX) 1 MG tablet TAKE ONE TABLET BY MOUTH ONCE DAILY AS NEEDED FOR ANXIETY 15 tablet 0  . amoxicillin (AMOXIL) 500 MG tablet Take 1 tablet (500 mg total) by mouth 2 (two) times daily. 20 tablet 0  . aspirin EC 81 MG tablet Take 81 mg by mouth daily.    Marland Kitchen azelastine (ASTELIN) 0.1 % nasal spray Place 2 sprays into both nostrils at bedtime. Use in each nostril as directed    . carvedilol (COREG) 25 MG tablet Take 1 tablet (25 mg total) by mouth 2 (two) times daily. 180 tablet 3  . citalopram (CELEXA) 20 MG tablet TAKE ONE TABLET BY MOUTH ONCE DAILY 30 tablet 0  . clobetasol cream (TEMOVATE) 0.05 % Apply 1 application topically 2 (two) times daily as needed (for eczema). 30 g 5  .  digoxin (LANOXIN) 0.125 MG tablet Take 1 tablet (0.125 mg total) by mouth every other day. 15 tablet 5  . fluticasone (FLONASE) 50 MCG/ACT nasal spray Place 1 spray into both nostrils at bedtime.    . furosemide (LASIX) 40 MG tablet Take 1 tablet (40 mg total) by mouth daily as needed for fluid. 90 tablet 1  . lisinopril (PRINIVIL,ZESTRIL) 20 MG tablet Take 1 tablet (20 mg total) by mouth 2 (two) times daily. 60 tablet 6  . spironolactone (ALDACTONE) 25 MG tablet Take 1 tablet (25 mg total) by mouth daily. 30 tablet 6  . warfarin (COUMADIN) 5 MG tablet Take 1 tablet daily or as directed by anticoagulation clinic 90 tablet 0   No current facility-administered  medications for this visit.    Allergies:   Review of patient's allergies indicates no known allergies.   Social History:  The patient  reports that he has never smoked. He has never used smokeless tobacco. He reports that he does not drink alcohol or use illicit drugs.   Family History:  The patient's family history includes Cancer (age of onset: 17) in his brother; Diabetes in his paternal grandmother; Heart disease in his maternal grandfather and mother.    ROS:  Please see the history of present illness.   All other systems are reviewed and negative.    PHYSICAL EXAM: Filed Vitals:   08/19/14 1725  BP: 118/86  Pulse: 78    GEN: Well nourished, well developed, in no acute distress HEENT: normal Neck: no JVD, carotid bruits, or masses Cardiac: iRRR; no murmurs, rubs, or gallops,no edema  Respiratory:  clear to auscultation bilaterally, normal work of breathing GI: soft, nontender, nondistended, + BS MS: no deformity or atrophy Skin: warm and dry, device pocket is well healed Neuro:  Strength and sensation are intact Psych: euthymic mood, full affect  EKG:  EKG is ordered today. The ekg ordered today shows atypical atrial flutter with variable conduction  Device interrogation is reviewed today in detail.  See PaceArt for details.   Recent Labs: 12/27/2013: ALT 21; Magnesium 1.9; Pro B Natriuretic peptide (BNP) 37.5; TSH 2.460 01/11/2014: BUN 10; Creatinine 1.0; Hemoglobin 12.9*; Platelets 301.0; Potassium 3.9; Sodium 135    Lipid Panel     Component Value Date/Time   CHOL  03/20/2009 0410    170        ATP III CLASSIFICATION:  <200     mg/dL   Desirable  213-086  mg/dL   Borderline High  >=578    mg/dL   High          TRIG 469 03/20/2009 0410   HDL 32* 03/20/2009 0410   CHOLHDL 5.3 03/20/2009 0410   VLDL 22 03/20/2009 0410   LDLCALC * 03/20/2009 0410    116        Total Cholesterol/HDL:CHD Risk Coronary Heart Disease Risk Table                     Men    Women  1/2 Average Risk   3.4   3.3  Average Risk       5.0   4.4  2 X Average Risk   9.6   7.1  3 X Average Risk  23.4   11.0        Use the calculated Patient Ratio above and the CHD Risk Table to determine the patient's CHD Risk.        ATP III CLASSIFICATION (LDL):  <100  mg/dL   Optimal  161-096  mg/dL   Near or Above                    Optimal  130-159  mg/dL   Borderline  045-409  mg/dL   High  >811     mg/dL   Very High     Wt Readings from Last 3 Encounters:  03/05/14 356 lb 12.8 oz (161.843 kg)  02/21/14 360 lb 3.2 oz (163.386 kg)  01/18/14 370 lb (167.831 kg)    ASSESSMENT AND PLAN:  1.  Atypical atrial flutter (CL 390 msec) The patient has a h/o typical atrial flutter s/p ablation by Dr Graciela Husbands and also atrial fibrillation previously. He is appropriately anticoagulated with coumadin.  He now presents with new onset atypical atrial flutter.  This is his first episode. Today in the office I was able to terminate his atrial flutter with rapid atrial pacing at a CL of 270 msec  (NIPS).  He remained in sinus and his symptoms resolved. At this point, I would not advise medicine changes.  If his arrhythmia burden increases then AADs could be considered.  I would probably defer ablation initially given that he has had atrial fibrillation also.  I have advised lifestyle modification including regular exercise and weight reduction (as per Cardiofit trial). He will follow-up with Dr Royann Shivers.  Should his episodes return, he should follow-up with Dr Graciela Husbands  2. Nonischemic CM euvolemic today Normal ICD function See Pace Art report No changes today  3. HTN Stable No change required today  4. Obesity Weight loss is advised  Current medicines are reviewed at length with the patient today.   The patient does not have concerns regarding his medicines.  The following changes were made today:  none  Labs/ tests ordered today include:  Orders Placed This Encounter   Procedures  . Implantable device check    Follow-up: follow-up with Dr Royann Shivers as scheduled in June He will contact our office should any additional problems arise  Signed, Hillis Range, MD  08/19/2014 5:25 PM     [facility] [address] [place] [address] [phone] (office) [phone] (fax)

## 2014-08-29 ENCOUNTER — Telehealth: Payer: Self-pay | Admitting: Internal Medicine

## 2014-08-29 NOTE — Telephone Encounter (Signed)
New message      Pt saw Dr Johney Frame last Monday.  He is calling to see if he has had any more "episodes"

## 2014-08-29 NOTE — Telephone Encounter (Signed)
Informed pt that he has had no episodes since 08-19-14 pt verbalized understanding.

## 2014-08-30 ENCOUNTER — Other Ambulatory Visit: Payer: Self-pay | Admitting: Family Medicine

## 2014-09-16 ENCOUNTER — Other Ambulatory Visit: Payer: Self-pay | Admitting: Family Medicine

## 2014-09-16 ENCOUNTER — Other Ambulatory Visit: Payer: Self-pay | Admitting: Pharmacist

## 2014-09-16 MED ORDER — CITALOPRAM HYDROBROMIDE 20 MG PO TABS: 20.0000 mg | ORAL_TABLET | Freq: Every day | ORAL | Status: DC

## 2014-09-16 NOTE — Telephone Encounter (Signed)
Patient aware rx has been sent to pharmacy and he is aware we will not refill celexa anymore without being seen.

## 2014-09-25 ENCOUNTER — Ambulatory Visit (INDEPENDENT_AMBULATORY_CARE_PROVIDER_SITE_OTHER): Payer: Medicare Other | Admitting: Pharmacist

## 2014-09-25 DIAGNOSIS — I480 Paroxysmal atrial fibrillation: Secondary | ICD-10-CM | POA: Diagnosis not present

## 2014-09-25 DIAGNOSIS — Z7901 Long term (current) use of anticoagulants: Secondary | ICD-10-CM | POA: Diagnosis not present

## 2014-09-25 LAB — POCT INR: INR: 2.1

## 2014-09-25 NOTE — Patient Instructions (Signed)
Anticoagulation Dose Instructions as of 09/25/2014      Danny Hinton Tue Wed Thu Fri Sat   New Dose 2.5 mg 5 mg 5 mg 5 mg 5 mg 5 mg 2.5 mg    Description        Continue warfarin at current dose of 1/2 tablet on saturdays and sundays. take 1 tablet all other days.      INR was 2.1 today

## 2014-10-03 ENCOUNTER — Encounter: Payer: Self-pay | Admitting: *Deleted

## 2014-10-15 ENCOUNTER — Telehealth: Payer: Self-pay | Admitting: Cardiovascular Disease

## 2014-10-15 ENCOUNTER — Other Ambulatory Visit: Payer: Self-pay | Admitting: Family Medicine

## 2014-10-15 ENCOUNTER — Encounter: Payer: Medicare Other | Admitting: Cardiovascular Disease

## 2014-10-15 ENCOUNTER — Encounter: Payer: Self-pay | Admitting: Cardiovascular Disease

## 2014-10-15 ENCOUNTER — Other Ambulatory Visit: Payer: Self-pay | Admitting: *Deleted

## 2014-10-15 ENCOUNTER — Ambulatory Visit (INDEPENDENT_AMBULATORY_CARE_PROVIDER_SITE_OTHER): Payer: Medicare Other | Admitting: Cardiovascular Disease

## 2014-10-15 VITALS — BP 138/60 | HR 64 | Ht 77.0 in | Wt 378.3 lb

## 2014-10-15 DIAGNOSIS — I5022 Chronic systolic (congestive) heart failure: Secondary | ICD-10-CM

## 2014-10-15 DIAGNOSIS — I4892 Unspecified atrial flutter: Secondary | ICD-10-CM | POA: Diagnosis not present

## 2014-10-15 DIAGNOSIS — I429 Cardiomyopathy, unspecified: Secondary | ICD-10-CM | POA: Diagnosis not present

## 2014-10-15 DIAGNOSIS — I471 Supraventricular tachycardia: Secondary | ICD-10-CM | POA: Diagnosis not present

## 2014-10-15 DIAGNOSIS — I10 Essential (primary) hypertension: Secondary | ICD-10-CM

## 2014-10-15 DIAGNOSIS — I428 Other cardiomyopathies: Secondary | ICD-10-CM

## 2014-10-15 DIAGNOSIS — Z7901 Long term (current) use of anticoagulants: Secondary | ICD-10-CM

## 2014-10-15 LAB — CUP PACEART INCLINIC DEVICE CHECK
Brady Statistic RA Percent Paced: 31 %
Brady Statistic RV Percent Paced: 0.06 %
HIGH POWER IMPEDANCE MEASURED VALUE: 44.3203
Lead Channel Impedance Value: 437.5 Ohm
Lead Channel Impedance Value: 450 Ohm
Lead Channel Pacing Threshold Amplitude: 0.75 V
Lead Channel Pacing Threshold Amplitude: 0.75 V
Lead Channel Pacing Threshold Amplitude: 1.25 V
Lead Channel Pacing Threshold Amplitude: 1.25 V
Lead Channel Pacing Threshold Pulse Width: 0.5 ms
Lead Channel Pacing Threshold Pulse Width: 0.9 ms
Lead Channel Pacing Threshold Pulse Width: 0.9 ms
Lead Channel Setting Pacing Amplitude: 2.5 V
Lead Channel Setting Pacing Pulse Width: 0.5 ms
MDC IDC MSMT BATTERY REMAINING LONGEVITY: 56.4 mo
MDC IDC MSMT LEADCHNL RA SENSING INTR AMPL: 3.1 mV
MDC IDC MSMT LEADCHNL RV PACING THRESHOLD PULSEWIDTH: 0.5 ms
MDC IDC MSMT LEADCHNL RV SENSING INTR AMPL: 11.7 mV
MDC IDC SESS DTM: 20160607120336
MDC IDC SET LEADCHNL RV PACING AMPLITUDE: 2.5 V
MDC IDC SET LEADCHNL RV SENSING SENSITIVITY: 0.5 mV
MDC IDC SET ZONE DETECTION INTERVAL: 400 ms
Pulse Gen Serial Number: 621444
Zone Setting Detection Interval: 250 ms
Zone Setting Detection Interval: 285 ms

## 2014-10-15 MED ORDER — DIGOXIN 125 MCG PO TABS: 0.1250 mg | ORAL_TABLET | ORAL | Status: DC

## 2014-10-15 MED ORDER — SPIRONOLACTONE 25 MG PO TABS: 25.0000 mg | ORAL_TABLET | Freq: Every day | ORAL | Status: DC

## 2014-10-15 MED ORDER — CARVEDILOL 25 MG PO TABS: ORAL_TABLET | ORAL | Status: DC

## 2014-10-15 MED ORDER — CARVEDILOL 25 MG PO TABS: 25.0000 mg | ORAL_TABLET | Freq: Two times a day (BID) | ORAL | Status: DC

## 2014-10-15 MED ORDER — LISINOPRIL 20 MG PO TABS: 20.0000 mg | ORAL_TABLET | Freq: Two times a day (BID) | ORAL | Status: DC

## 2014-10-15 MED ORDER — FUROSEMIDE 40 MG PO TABS: 40.0000 mg | ORAL_TABLET | Freq: Every day | ORAL | Status: DC | PRN

## 2014-10-15 NOTE — Telephone Encounter (Signed)
Please defer to PCP

## 2014-10-15 NOTE — Progress Notes (Signed)
Patient ID: Danny Hinton, male   DOB: 1980/10/10, 34 y.o.   MRN: [id number]     Cardiology Office Note   Date:  10/15/2014   ID:  Danny Hinton, DOB 1980-07-16, MRN [id number]  PCP:  Rudi Heap, MD  Cardiologist:   Thurmon Fair, MD   Chief Complaint  Patient presents with  . Follow-up    6 months:  No complaints of chest pain, SOB.  One episode of ankle edema resolved with Furosemide.  Infreq. dizziness with standing too quickly.      History of Present Illness: Danny Hinton is a 34 y.o. male who presents for nonischemic cardiomyopathy, well compensated chronic systolic heart failure, defibrillator checkup, paroxysmal atrial tachycardia.  Danny is generally feeling quite well and denies complaints of angina, dyspnea, syncope. He had mild ankle swelling when evening that resolved with a dose of furosemide. He has infrequent issues with orthostatic hypotension. In September 2015 he underwent chemotherapy tricuspid isthmus ablation for atrial flutter. On 08/19/2014, while [place] hunting he develop persistent rapid palpitations and felt unwell. He underwent successful overdrive pacing via his device by Dr. Hillis Range. The rhythm was atypical atrial flutter, cycle length roughly 390 ms. The episode lasted for about 3 hours until he was successfully overdrive paced.  Interrogation of his device today shows 1 recurrent episode of atrial tachycardia (probably atypical atrial flutter, atrial atrial cycle length around 370 ms, ventricular rate 130 bpm. This time the episode lasted for only 2-1/2 minutes and resolve spontaneously.  He did have symptoms briefly (general feeling of malaise, no syncope). In the past he has had paroxysmal atrial fibrillation. The decision was made to ablate his flutter secondary to the occurrence of multiple unnecessary defibrillator shocks in the setting of atrial flutter with rapid ventricular rate    Past Medical History  Diagnosis Date  . Chronic systolic  congestive heart failure     a. suspected NICM - dx 2010. EF 15% by TEE, 10-20% by echo at that time. b. s/p St. Jude AICD 2011. c. Echo 01/2010: mod dilated LV, EF 30%, mod aortic root dilitation, no significant valvular disease.  Marland Kitchen Hypertension   . Eczema   . PAF (paroxysmal atrial fibrillation)   . Automatic implantable cardioverter-defibrillator in situ     a. St Jude in 2011.  Marland Kitchen Chronic anticoagulation   . Nonischemic cardiomyopathy   . Eczema   . Paroxysmal atrial flutter     s/p ablation  . Mobitz type 2 second degree atrioventricular block     a. During sleeping hours in 2010 suspected due to ? OSA.  . Morbid obesity   . Atypical atrial flutter 08/19/14    pace terminated in device clinic.  AFL cycle length was 390 msec    Past Surgical History  Procedure Laterality Date  . Cardiac defibrillator placement  02/17/10    St. Jude Medical 45DR, model number [id number], serial number [id number]  . Tooth extraction N/A 10/23/2012    Procedure: EXTRACTION TEETH 1, 16, 17, 30, 31;  Surgeon: [name], DDS;  Location: [facility] OR;  Service: Oral Surgery;  Laterality: N/A;  . Cardioversion N/A 12/29/2013    Procedure: CARDIOVERSION;  Surgeon: Duke Salvia, MD;  Location: [facility] OR;  Service: Cardiovascular;  Laterality: N/A;  . Atrial flutter ablation N/A 01/18/2014    Procedure: ATRIAL FLUTTER ABLATION;  Surgeon: Duke Salvia, MD;  Location: [facility] CATH LAB;  Service: Cardiovascular;  Laterality: N/A;     Current Outpatient Prescriptions  Medication Sig Dispense Refill  . ALPRAZolam (XANAX) 1 MG tablet TAKE ONE TABLET BY MOUTH ONCE DAILY AS NEEDED FOR ANXIETY 15 tablet 0  . azelastine (ASTELIN) 0.1 % nasal spray Place 2 sprays into both nostrils at bedtime. Use in each nostril as directed    . citalopram (CELEXA) 20 MG tablet Take 1 tablet (20 mg total) by mouth daily. 30 tablet 0  . clobetasol cream (TEMOVATE) 0.05 % Apply 1 application topically 2 (two) times daily as needed (for eczema). 30  g 5  . digoxin (LANOXIN) 0.125 MG tablet Take 1 tablet (0.125 mg total) by mouth every other day. 45 tablet 3  . fluticasone (FLONASE) 50 MCG/ACT nasal spray Place 1 spray into both nostrils at bedtime.    . furosemide (LASIX) 40 MG tablet Take 1 tablet (40 mg total) by mouth daily as needed for fluid. 30 tablet 3  . lisinopril (PRINIVIL,ZESTRIL) 20 MG tablet Take 1 tablet (20 mg total) by mouth 2 (two) times daily. 180 tablet 3  . spironolactone (ALDACTONE) 25 MG tablet Take 1 tablet (25 mg total) by mouth daily. 90 tablet 3  . warfarin (COUMADIN) 5 MG tablet TAKE ONE TABLET BY MOUTH ONCE DAILY AS DIRECTED BY  ANTICOAGULATION  CLINIC 90 tablet 0  . carvedilol (COREG) 25 MG tablet Take 1 tablet in the AM and 1 1/2 tablet in the PM. 270 tablet 3   No current facility-administered medications for this visit.    Allergies:   Review of patient's allergies indicates no known allergies.    Social History:  The patient  reports that he has never smoked. He has never used smokeless tobacco. He reports that he does not drink alcohol or use illicit drugs.   Family History:  The patient's family history includes Cancer (age of onset: 13) in his brother; Diabetes in his paternal grandmother; Heart disease in his maternal grandfather and mother.    ROS:  Please see the history of present illness.    Otherwise, review of systems positive for none.   All other systems are reviewed and negative.    PHYSICAL EXAM: VS:  BP 138/60 mmHg  Pulse 64  Ht 6\' 5"  (1.956 m)  Wt 171.596 kg (378 lb 4.8 oz)  BMI 44.85 kg/m2 , BMI Body mass index is 44.85 kg/(m^2).  General: Alert, oriented x3, no distress, morbidly obese  Head: no evidence of trauma, PERRL, EOMI, no exophtalmos or lid lag, no myxedema, no xanthelasma; normal ears, nose and oropharynx  Neck: normal jugular venous pulsations and no hepatojugular reflux; brisk carotid pulses without delay and no carotid bruits  Chest: clear to auscultation, no  signs of consolidation by percussion or palpation, normal fremitus, symmetrical and full respiratory excursions  Cardiovascular: Unable to define position and quality of the apical impulse, regular rhythm, normal first and second heart sounds, no murmurs, rubs or gallops  Abdomen: no tenderness or distention, no masses by palpation, no abnormal pulsatility or arterial bruits, normal bowel sounds, no hepatosplenomegaly  Extremities: no clubbing, cyanosis or edema; 2+ radial, ulnar and brachial pulses bilaterally; 2+ right femoral, posterior tibial and dorsalis pedis pulses; 2+ left femoral, posterior tibial and dorsalis pedis pulses; no subclavian or femoral bruits  Neurological: grossly nonfocal Psych: euthymic mood, full affect   EKG:  EKG is not ordered today.  Recent Labs: 12/27/2013: ALT 21; Magnesium 1.9; Pro B Natriuretic peptide (BNP) 37.5; TSH 2.460 01/11/2014: BUN 10; Creatinine 1.0; Hemoglobin 12.9*; Platelets 301.0; Potassium 3.9; Sodium 135  Lipid Panel    Component Value Date/Time   CHOL  03/20/2009 0410    170        ATP III CLASSIFICATION:  <200     mg/dL   Desirable  161-096  mg/dL   Borderline High  >=045    mg/dL   High          TRIG 409 03/20/2009 0410   HDL 32* 03/20/2009 0410   CHOLHDL 5.3 03/20/2009 0410   VLDL 22 03/20/2009 0410   LDLCALC * 03/20/2009 0410    116        Total Cholesterol/HDL:CHD Risk Coronary Heart Disease Risk Table                     Men   Women  1/2 Average Risk   3.4   3.3  Average Risk       5.0   4.4  2 X Average Risk   9.6   7.1  3 X Average Risk  23.4   11.0        Use the calculated Patient Ratio above and the CHD Risk Table to determine the patient's CHD Risk.        ATP III CLASSIFICATION (LDL):  <100     mg/dL   Optimal  811-914  mg/dL   Near or Above                    Optimal  130-159  mg/dL   Borderline  782-956  mg/dL   High  >213     mg/dL   Very High      Wt Readings from Last 3 Encounters:  10/15/14  171.596 kg (378 lb 4.8 oz)  03/05/14 161.843 kg (356 lb 12.8 oz)  02/21/14 163.386 kg (360 lb 3.2 oz)    ASSESSMENT AND PLAN:  Chronic combined systolic and diastolic heart failure secondary to nonischemic cardiopathy Compensated, good functional status, euvolemic on intermittent low dose loop diuretic and appropriate beta blocker and ACE inhibitor and aldosterone antagonist therapy, also on digoxin. Most recent EF estimated at 20-25 %.  Paroxysmal atrial tachycardia Status post atrial flutter ablation. On the current medical regimen that includes digoxin and carvedilol his ventricular rates have not approached ICD therapy zone. I don't think we can stop his digoxin yet. Have asked him to increase his carvedilol to 37.5 mg at bedtime and continue 25 mg in the morning (he reports that at home his systolic blood pressure is typically in the 100-110 range). If he tolerates this well we'll try to increase Carvedilol to 37.5 mg twice a day. We'll ask Dr. Odessa Fleming opinion regarding the indication for true antiarrhythmics therapy, but I am reluctant at this point.  Paroxysmal atrial fibrillation previously with good rate control. On warfarin therapy no history of stroke or other embolic events. No bleeding complications. I don't think he also needs to take aspirin.  Posttraumatic stress disorder After defibrillator discharges. He seems to be dealing with it much better.   Current medicines are reviewed at length with the patient today.  The patient does not have concerns regarding medicines.  The following changes have been made:  Carvedilol 37.5 mg in the evening and 25 mg in the morning daily  Labs/ tests ordered today include:  Orders Placed This Encounter  Procedures  . Implantable device check    Patient Instructions  Remote monitoring is used to monitor your ICD from home. This monitoring reduces the number of  office visits required to check your device to one time per year. It allows [place]  to keep an eye on the functioning of your device to ensure it is working properly. You are scheduled for a device check from home on 01/14/2015. You may send your transmission at any time that day. If you have a wireless device, the transmission will be sent automatically. After your physician reviews your transmission, you will receive a postcard with your next transmission date.  Your physician recommends that you schedule a follow-up appointment in: 12 months with Dr.Cheri Ayotte.  Medication Instructions:   INCREASE Carvedilol to 1 tablet  in the mornings and 1 1/2 tablets  (37.5mg ) in the evenings.  Call after 3-4 weeks on this increased dose and let us know how you are feeling.  Dr. Salena Saner may increase to 37.5mg  twice a day.   STOP Aspirin.             Joie Bimler, MD  10/15/2014 5:17 PM    Thurmon Fair, MD, [facility] [phone] office [phone] pager

## 2014-10-15 NOTE — Telephone Encounter (Signed)
°  1. Which medications need to be refilled? Citalopram  2. Which pharmacy is medication to be sent to?[phone] 3. Do they need a 30 day or 90 day supply? 90 and refills  4. Would they like a call back once the medication has been sent to the pharmacy? yes

## 2014-10-15 NOTE — Telephone Encounter (Signed)
Notified patient to contact PCP regarding refills.

## 2014-10-15 NOTE — Patient Instructions (Addendum)
Remote monitoring is used to monitor your ICD from home. This monitoring reduces the number of office visits required to check your device to one time per year. It allows [place] to keep an eye on the functioning of your device to ensure it is working properly. You are scheduled for a device check from home on 01/14/2015. You may send your transmission at any time that day. If you have a wireless device, the transmission will be sent automatically. After your physician reviews your transmission, you will receive a postcard with your next transmission date.  Your physician recommends that you schedule a follow-up appointment in: 12 months with Dr.Croitoru.  Medication Instructions:   INCREASE Carvedilol to 1 tablet 25mg  in the mornings and 1 1/2 tablets  (37.5mg ) in the evenings.  Call after 3-4 weeks on this increased dose and let us know how you are feeling.  Dr. Salena Saner may increase to 37.5mg  twice a day.   STOP Aspirin.

## 2014-10-17 ENCOUNTER — Other Ambulatory Visit: Payer: Self-pay | Admitting: Family Medicine

## 2014-10-31 NOTE — Addendum Note (Signed)
Addended by: Dennis Bast F on: 10/31/2014 01:26 PM   Modules accepted: Orders

## 2014-11-01 ENCOUNTER — Encounter: Payer: Self-pay | Admitting: Cardiovascular Disease

## 2014-11-04 ENCOUNTER — Telehealth: Payer: Self-pay | Admitting: Cardiovascular Disease

## 2014-11-04 MED ORDER — CARVEDILOL 25 MG PO TABS: ORAL_TABLET | ORAL | Status: DC

## 2014-11-04 NOTE — Telephone Encounter (Signed)
Returned call to patient Dr.Croitoru advised to increase Carvedilol to 25 mg 1&1/2 tablets twice a day.Advised to call back with B/P readings in 1 week.

## 2014-11-04 NOTE — Telephone Encounter (Signed)
Please call,needs to give you an update on his blood pressure.

## 2014-11-04 NOTE — Telephone Encounter (Signed)
Returned call to patient he was calling to report B/P readings.B/P ranging 121/79 to 139/85.Lowest reading 1 day only 95/75.Pulse at rest 60.Pulse with activity 79 to 80.Stated he has been feeling good.Message sent to Dr.Croitoru.

## 2014-11-04 NOTE — Telephone Encounter (Signed)
Great. Please increase carvedilol to 37.5 mg BID and send BP recordings again in one week. MCr.

## 2014-11-12 ENCOUNTER — Encounter: Payer: Medicare Other | Admitting: Cardiovascular Disease

## 2014-11-14 ENCOUNTER — Telehealth: Payer: Self-pay | Admitting: Cardiovascular Disease

## 2014-11-14 NOTE — Telephone Encounter (Signed)
Pt is calling in to report his BP numbers since his medication has been changed. Please f/u with him  Thanks

## 2014-11-14 NOTE — Telephone Encounter (Signed)
Mr Tienken calling to report b/p readings range  From 121-141/76-89 varies daily. Patient states he take blood pressure 3-4 times a day. Resting pulse is in the 60.  patient increased  Carvedilol 25 mg to 1 and 1/2 tablets twice a day. Patient states he is not had or felt heart racing at all.  Patient aware will defer to Dr Royann Shivers and contact him with further instructions.

## 2014-11-15 ENCOUNTER — Telehealth: Payer: Self-pay | Admitting: Cardiovascular Disease

## 2014-11-15 NOTE — Telephone Encounter (Signed)
°  1. Has your device fired? No ° °2. Is you device beeping? No ° °3. Are you experiencing draining or swelling at device site? No ° °4. Are you calling to see if we received your device transmission? Yes ° °5. Have you passed out? No ° °

## 2014-11-15 NOTE — Telephone Encounter (Signed)
Good. Stay on current meds.

## 2014-11-15 NOTE — Telephone Encounter (Signed)
Spoke w/ pt and informed him that his transmission was received and he has had no episodes since June. Pt verbalized understanding.

## 2014-11-15 NOTE — Telephone Encounter (Signed)
Patient notified to continue current medications.  Voiced understanding.

## 2014-12-11 ENCOUNTER — Ambulatory Visit (INDEPENDENT_AMBULATORY_CARE_PROVIDER_SITE_OTHER): Payer: Medicare Other | Admitting: Pharmacist

## 2014-12-11 ENCOUNTER — Telehealth: Payer: Self-pay | Admitting: Pharmacist

## 2014-12-11 DIAGNOSIS — I480 Paroxysmal atrial fibrillation: Secondary | ICD-10-CM

## 2014-12-11 DIAGNOSIS — Z7901 Long term (current) use of anticoagulants: Secondary | ICD-10-CM | POA: Diagnosis not present

## 2014-12-11 LAB — POCT INR: INR: 2

## 2014-12-11 MED ORDER — DIGOXIN 125 MCG PO TABS: 0.1250 mg | ORAL_TABLET | ORAL | Status: DC

## 2014-12-11 NOTE — Patient Instructions (Signed)
Anticoagulation Dose Instructions as of 12/11/2014      Danny Hinton Tue Wed Thu Fri Sat   New Dose 2.5 mg 5 mg 5 mg 5 mg 5 mg 5 mg 2.5 mg    Description        Continue warfarin at current dose of 1/2 tablet on saturdays and sundays. take 1 tablet all other days.      INR was 2.0 today

## 2014-12-11 NOTE — Telephone Encounter (Signed)
He has only seen you, no provider in a year

## 2014-12-15 ENCOUNTER — Other Ambulatory Visit: Payer: Self-pay | Admitting: Family Medicine

## 2014-12-15 ENCOUNTER — Other Ambulatory Visit: Payer: Self-pay | Admitting: Pharmacist

## 2015-01-01 ENCOUNTER — Telehealth: Payer: Self-pay | Admitting: Family Medicine

## 2015-01-01 MED ORDER — CITALOPRAM HYDROBROMIDE 20 MG PO TABS: ORAL_TABLET | ORAL | Status: DC

## 2015-01-01 NOTE — Telephone Encounter (Signed)
I can refill for 1 month but will need to make appt to establish care with a PCP.  appt made with Dr Ermalinda Memos for 01/22/2015.

## 2015-01-06 ENCOUNTER — Ambulatory Visit (INDEPENDENT_AMBULATORY_CARE_PROVIDER_SITE_OTHER): Payer: Medicare Other | Admitting: Family Medicine

## 2015-01-06 ENCOUNTER — Encounter: Payer: Self-pay | Admitting: Family Medicine

## 2015-01-06 VITALS — BP 91/60 | HR 62 | Temp 97.4°F | Ht 77.0 in | Wt 392.0 lb

## 2015-01-06 DIAGNOSIS — J019 Acute sinusitis, unspecified: Secondary | ICD-10-CM | POA: Insufficient documentation

## 2015-01-06 DIAGNOSIS — J0100 Acute maxillary sinusitis, unspecified: Secondary | ICD-10-CM

## 2015-01-06 MED ORDER — AMOXICILLIN-POT CLAVULANATE 875-125 MG PO TABS: 1.0000 | ORAL_TABLET | Freq: Two times a day (BID) | ORAL | Status: DC

## 2015-01-06 NOTE — Patient Instructions (Signed)

## 2015-01-06 NOTE — Progress Notes (Signed)
BP 91/60 mmHg  Pulse 62  Temp(Src) 97.4 F (36.3 C) (Oral)  Ht 6\' 5"  (1.956 m)  Wt 392 lb (177.81 kg)  BMI 46.47 kg/m2  SpO2 96%   Subjective:    Patient ID: Danny E Nedrow, male    DOB: Nov 09, 1980, 34 y.o.   MRN: [id number]  HPI: Danny Hinton is a 34 y.o. male presenting on 01/06/2015 for Nasal Congestion; Otalgia; Facial Pain; and Cough   HPI Sinus issues Patient presents with 1 week to 2 weeks of increasing sinus pressure, nasal congestion, ear congestion and sore throat. Is also had a cough which is productive of yellow phlegm. 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 He has been using his Flonase and Claritin for the past week and a half since this started and it has been getting worse despite this. He denies any fevers or chills.  Relevant past medical, surgical, family and social history reviewed and updated as indicated. Interim medical history since our last visit reviewed. Allergies and medications reviewed and updated.  Review of Systems  Constitutional: Negative for fever.  HENT: Positive for ear pain, postnasal drip, rhinorrhea, sinus pressure, sore throat and voice change. Negative for ear discharge and hearing loss.   Eyes: Negative for pain, discharge, redness and visual disturbance.  Respiratory: Positive for cough. Negative for chest tightness, shortness of breath and wheezing.   Cardiovascular: Negative for chest pain, palpitations and leg swelling.  Gastrointestinal: Negative for abdominal pain, diarrhea and constipation.  Genitourinary: Negative for difficulty urinating.  Musculoskeletal: Negative for back pain and gait problem.  Skin: Negative for rash.  Allergic/Immunologic: Positive for environmental allergies (occurs this time of year every year).  Neurological: Negative for dizziness, syncope, light-headedness and headaches.  Hematological: Negative for adenopathy.  All other systems reviewed and are negative.   Per  HPI unless specifically indicated above     Medication List       This list is accurate as of: 01/06/15 10:47 AM.  Always use your most recent med list.               ALPRAZolam 1 MG tablet  Commonly known as:  XANAX  TAKE ONE TABLET BY MOUTH ONCE DAILY AS NEEDED FOR ANXIETY     amoxicillin-clavulanate 875-125 MG per tablet  Commonly known as:  AUGMENTIN  Take 1 tablet by mouth 2 (two) times daily.     azelastine 0.1 % nasal spray  Commonly known as:  ASTELIN  Place 2 sprays into both nostrils at bedtime. Use in each nostril as directed     carvedilol 25 MG tablet  Commonly known as:  COREG  Take 1&1/2 tablets twice a day     citalopram 20 MG tablet  Commonly known as:  CELEXA  TAKE ONE TABLET BY MOUTH ONCE DAILY     clobetasol cream 0.05 %  Commonly known as:  TEMOVATE  Apply 1 application topically 2 (two) times daily as needed (for eczema).     digoxin 0.125 MG tablet  Commonly known as:  LANOXIN  Take 1 tablet (0.125 mg total) by mouth every other day.     fluticasone 50 MCG/ACT nasal spray  Commonly known as:  FLONASE  Place 1 spray into both nostrils at bedtime.     furosemide 40 MG tablet  Commonly known as:  LASIX  Take 1 tablet (40 mg total) by mouth daily as needed for fluid.  lisinopril 20 MG tablet  Commonly known as:  PRINIVIL,ZESTRIL  Take 1 tablet (20 mg total) by mouth 2 (two) times daily.     spironolactone 25 MG tablet  Commonly known as:  ALDACTONE  Take 1 tablet (25 mg total) by mouth daily.     warfarin 5 MG tablet  Commonly known as:  COUMADIN  TAKE ONE TABLET BY MOUTH ONCE DAILY AS  DIRECTED  BY  ANTICOAGULATION  CLINIC           Objective:    BP 91/60 mmHg  Pulse 62  Temp(Src) 97.4 F (36.3 C) (Oral)  Ht  (1.956 m)  Wt 392 lb (177.81 kg)  BMI 46.47 kg/m2  SpO2 96%  Wt Readings from Last 3 Encounters:  01/06/15 392 lb (177.81 kg)  10/15/14 378 lb 4.8 oz (171.596 kg)  03/05/14 356 lb 12.8 oz (161.843 kg)      Physical Exam  Constitutional: He is oriented to person, place, and time. He appears well-developed and well-nourished. No distress.  HENT:  Right Ear: No drainage or tenderness. Tympanic membrane is erythematous and bulging. Tympanic membrane is not injected and not perforated. No middle ear effusion.  Left Ear: No drainage or tenderness. Tympanic membrane is erythematous and bulging. Tympanic membrane is not injected and not perforated.  No middle ear effusion.  Eyes: Conjunctivae and EOM are normal. Pupils are equal, round, and reactive to light. Right eye exhibits no discharge. No scleral icterus.  Neck: No thyromegaly present.  Cardiovascular: Normal rate, regular rhythm, normal heart sounds and intact distal pulses.   No murmur heard. Pulmonary/Chest: Effort normal and breath sounds normal. No respiratory distress. He has no wheezes.  Abdominal: He exhibits no distension.  Musculoskeletal: Normal range of motion. He exhibits no edema.  Lymphadenopathy:    He has no cervical adenopathy.  Neurological: He is alert and oriented to person, place, and time. Coordination normal.  Skin: Skin is warm and dry. No rash noted. He is not diaphoretic.  Psychiatric: He has a normal mood and affect. His behavior is normal.  Vitals reviewed.   Results for orders placed or performed in visit on 12/11/14  POCT INR  Result Value Ref Range   INR 2.0       Assessment & Plan:   Problem List Items Addressed This Visit      Respiratory   Sinusitis, acute - Primary    Patient has acute sinusitis, has already attempted to use Flonase and Claritin for the past week without success. He feels this is worsening. Discussed antibiotic options and will go with Augmentin. Patient is on Coumadin and we instructed him to come this Friday for an INR check to assure that the antibiotic is not missing with his levels too much.      Relevant Medications   amoxicillin-clavulanate (AUGMENTIN) 875-125 MG per tablet        Follow up plan: Return if symptoms worsen or fail to improve.  Arville Care, MD [facility] 01/06/2015, 10:47 AM

## 2015-01-06 NOTE — Assessment & Plan Note (Signed)
Patient has acute sinusitis, has already attempted to use Flonase and Claritin for the past week without success. He feels this is worsening. Discussed antibiotic options and will go with Augmentin. Patient is on Coumadin and we instructed him to come this Friday for an INR check to assure that the antibiotic is not missing with his levels too much.

## 2015-01-14 ENCOUNTER — Ambulatory Visit (INDEPENDENT_AMBULATORY_CARE_PROVIDER_SITE_OTHER): Payer: Medicare Other | Admitting: *Deleted

## 2015-01-14 DIAGNOSIS — I429 Cardiomyopathy, unspecified: Secondary | ICD-10-CM

## 2015-01-14 DIAGNOSIS — I5022 Chronic systolic (congestive) heart failure: Secondary | ICD-10-CM | POA: Diagnosis not present

## 2015-01-14 DIAGNOSIS — I428 Other cardiomyopathies: Secondary | ICD-10-CM

## 2015-01-17 NOTE — Progress Notes (Signed)
Remote ICD transmission.   

## 2015-01-22 ENCOUNTER — Ambulatory Visit (INDEPENDENT_AMBULATORY_CARE_PROVIDER_SITE_OTHER): Payer: Medicare Other | Admitting: Family Medicine

## 2015-01-22 ENCOUNTER — Encounter: Payer: Medicare Other | Admitting: Pharmacist

## 2015-01-22 ENCOUNTER — Ambulatory Visit: Payer: Self-pay | Admitting: Family Medicine

## 2015-01-22 ENCOUNTER — Encounter: Payer: Self-pay | Admitting: Family Medicine

## 2015-01-22 VITALS — BP 99/64 | HR 59 | Temp 97.7°F | Ht 77.0 in | Wt 389.2 lb

## 2015-01-22 DIAGNOSIS — I5041 Acute combined systolic (congestive) and diastolic (congestive) heart failure: Secondary | ICD-10-CM | POA: Diagnosis not present

## 2015-01-22 DIAGNOSIS — Z0000 Encounter for general adult medical examination without abnormal findings: Secondary | ICD-10-CM

## 2015-01-22 DIAGNOSIS — L309 Dermatitis, unspecified: Secondary | ICD-10-CM | POA: Diagnosis not present

## 2015-01-22 DIAGNOSIS — I480 Paroxysmal atrial fibrillation: Secondary | ICD-10-CM | POA: Diagnosis not present

## 2015-01-22 DIAGNOSIS — Z7901 Long term (current) use of anticoagulants: Secondary | ICD-10-CM

## 2015-01-22 LAB — POCT INR: INR: 1.7

## 2015-01-22 MED ORDER — CARVEDILOL 25 MG PO TABS: ORAL_TABLET | ORAL | Status: DC

## 2015-01-22 MED ORDER — CITALOPRAM HYDROBROMIDE 20 MG PO TABS: ORAL_TABLET | ORAL | Status: DC

## 2015-01-22 MED ORDER — AZELASTINE HCL 0.1 % NA SOLN: 2.0000 | Freq: Every day | NASAL | Status: DC

## 2015-01-22 MED ORDER — DIGOXIN 125 MCG PO TABS
0.1250 mg | ORAL_TABLET | ORAL | Status: DC
Start: 2015-01-22 — End: 2015-02-10

## 2015-01-22 MED ORDER — WARFARIN SODIUM 5 MG PO TABS: ORAL_TABLET | ORAL | Status: DC

## 2015-01-22 MED ORDER — TRIAMCINOLONE ACETONIDE 0.1 % EX CREA: TOPICAL_CREAM | Freq: Two times a day (BID) | CUTANEOUS | Status: DC | PRN

## 2015-01-22 NOTE — Progress Notes (Signed)
BP 99/64 mmHg  Pulse 59  Temp(Src) 97.7 F (36.5 C) (Oral)  Ht _0  (1.956 m)  Wt 389 lb 3.2 oz (176.54 kg)  BMI 46.14 kg/m2   Subjective:    Patient ID: Danny Hinton, male    DOB: 16-Aug-1980, 34 y.o.   MRN: [id number]  HPI: Danny Hinton is a 34 y.o. male presenting on 01/22/2015 for Establish Care   HPI Adult well exam and medication refills Patient comes in today for his abdominal exam and screening labs and medication refills. He has congestive heart failure and paroxysmal A. fib and has a pacemaker that is currently being managed by cardiology. He is on Coreg and digoxin and lisinopril and spironolactone and Coumadin for these. Patient denies headaches, blurred vision, chest pains, shortness of breath, or weakness. Denies any side effects from medication and is content with current medication.  INR recheck Patient also comes in today for a Coumadin recheck, his INR today came back as 1.7 when he is normally above 2. This likely because he had an antibiotic after the sinus infection 2 weeks ago. He sees Tammy Eckard to manage this and she will adjust his dosing as necessary.  Relevant past medical, surgical, family and social history reviewed and updated as indicated. Interim medical history since our last visit reviewed. Allergies and medications reviewed and updated.  Review of Systems  Constitutional: Negative for fever and chills.  HENT: Negative for congestion, ear discharge and ear pain.   Eyes: Negative for pain, discharge and visual disturbance.  Respiratory: Negative for apnea, cough, shortness of breath and wheezing.   Cardiovascular: Negative for chest pain and leg swelling.  Gastrointestinal: Negative for abdominal pain, diarrhea and constipation.  Endocrine: Negative for cold intolerance, heat intolerance, polydipsia, polyphagia and polyuria.  Genitourinary: Negative for difficulty urinating.  Musculoskeletal: Negative for back pain and gait problem.  Skin:  Negative for rash.  Neurological: Negative for dizziness, syncope, light-headedness and headaches.  Psychiatric/Behavioral: Negative for dysphoric mood and agitation.  All other systems reviewed and are negative.   Per HPI unless specifically indicated above     Medication List       This list is accurate as of: 01/22/15 11:10 AM.  Always use your most recent med list.               ALPRAZolam 1 MG tablet  Commonly known as:  XANAX  TAKE ONE TABLET BY MOUTH ONCE DAILY AS NEEDED FOR ANXIETY     azelastine 0.1 % nasal spray  Commonly known as:  ASTELIN  Place 2 sprays into both nostrils at bedtime. Use in each nostril as directed     carvedilol 25 MG tablet  Commonly known as:  COREG  Take 1&1/2 tablets twice a day     citalopram 20 MG tablet  Commonly known as:  CELEXA  TAKE ONE TABLET BY MOUTH ONCE DAILY     clobetasol cream 0.05 %  Commonly known as:  TEMOVATE  Apply 1 application topically 2 (two) times daily as needed (for eczema).     digoxin 0.125 MG tablet  Commonly known as:  LANOXIN  Take 1 tablet (0.125 mg total) by mouth every other day.     fluticasone 50 MCG/ACT nasal spray  Commonly known as:  FLONASE  Place 1 spray into both nostrils at bedtime.     furosemide 40 MG tablet  Commonly known as:  LASIX  Take 1 tablet (40 mg total) by mouth daily  as needed for fluid.     lisinopril 20 MG tablet  Commonly known as:  PRINIVIL,ZESTRIL  Take 1 tablet (20 mg total) by mouth 2 (two) times daily.     spironolactone 25 MG tablet  Commonly known as:  ALDACTONE  Take 1 tablet (25 mg total) by mouth daily.     warfarin 5 MG tablet  Commonly known as:  COUMADIN  TAKE ONE TABLET BY MOUTH ONCE DAILY AS  DIRECTED  BY  ANTICOAGULATION  CLINIC           Objective:    BP 99/64 mmHg  Pulse 59  Temp(Src) 97.7 F (36.5 C) (Oral)  Ht $R'6\' 5"'km$  (1.956 m)  Wt 389 lb 3.2 oz (176.54 kg)  BMI 46.14 kg/m2  Wt Readings from Last 3 Encounters:  01/22/15 389 lb 3.2  oz (176.54 kg)  01/06/15 392 lb (177.81 kg)  10/15/14 378 lb 4.8 oz (171.596 kg)    Physical Exam  Constitutional: He is oriented to person, place, and time. He appears well-developed and well-nourished. No distress.  HENT:  Right Ear: External ear normal.  Left Ear: External ear normal.  Nose: Nose normal.  Mouth/Throat: Oropharynx is clear and moist. No oropharyngeal exudate.  Eyes: Conjunctivae and EOM are normal. Pupils are equal, round, and reactive to light. Right eye exhibits no discharge. No scleral icterus.  Neck: Neck supple. No thyromegaly present.  Cardiovascular: Normal rate, regular rhythm, normal heart sounds and intact distal pulses.   No murmur heard. Pulmonary/Chest: Effort normal and breath sounds normal. No respiratory distress. He has no wheezes.  Abdominal: Soft. Bowel sounds are normal. He exhibits no distension. There is no tenderness.  Musculoskeletal: Normal range of motion. He exhibits no edema.  Lymphadenopathy:    He has no cervical adenopathy.  Neurological: He is alert and oriented to person, place, and time. Coordination normal.  Skin: Skin is warm and dry. No rash noted. He is not diaphoretic.  Psychiatric: He has a normal mood and affect. His behavior is normal.  Vitals reviewed.   Results for orders placed or performed in visit on 01/22/15  POCT INR  Result Value Ref Range   INR 1.7       Assessment & Plan:   Problem List Items Addressed This Visit      Cardiovascular and Mediastinum   Paroxysmal atrial fibrillation   Relevant Medications   carvedilol (COREG) 25 MG tablet   digoxin (LANOXIN) 0.125 MG tablet   warfarin (COUMADIN) 5 MG tablet   Other Relevant Orders   Thyroid Panel With TSH   CBC with Differential/Platelet     Other   Long term current use of anticoagulant therapy   Relevant Orders   CBC with Differential/Platelet    Other Visit Diagnoses    Acute combined systolic and diastolic congestive heart failure    -   Primary    Doing well on medications, needs a refill. No major shortness of breath or difficulties.    Relevant Medications    carvedilol (COREG) 25 MG tablet    digoxin (LANOXIN) 0.125 MG tablet    warfarin (COUMADIN) 5 MG tablet    Other Relevant Orders    POCT INR (Completed)    CMP14+EGFR    Lipid panel    CBC with Differential/Platelet    Well adult exam        Patient presents today for well adult exam and screening labs.    Relevant Orders    CBC with  Differential/Platelet    Eczema        Relevant Medications    triamcinolone cream (KENALOG) 0.1 %    Other Relevant Orders    CBC with Differential/Platelet        Follow up plan: Return if symptoms worsen or fail to improve.  Caryl Pina, MD [facility] 01/22/2015, 11:10 AM

## 2015-01-22 NOTE — Addendum Note (Signed)
Addended by: Arville Care on: 01/22/2015 02:28 PM   Modules accepted: Level of Service

## 2015-01-23 ENCOUNTER — Telehealth: Payer: Self-pay

## 2015-01-23 LAB — CBC WITH DIFFERENTIAL/PLATELET
BASOS: 1 %
Basophils Absolute: 0 10*3/uL (ref 0.0–0.2)
EOS (ABSOLUTE): 0.1 10*3/uL (ref 0.0–0.4)
EOS: 4 %
HEMATOCRIT: 37.5 % (ref 37.5–51.0)
Hemoglobin: 12.6 g/dL (ref 12.6–17.7)
IMMATURE GRANS (ABS): 0 10*3/uL (ref 0.0–0.1)
IMMATURE GRANULOCYTES: 0 %
Lymphocytes Absolute: 1.9 10*3/uL (ref 0.7–3.1)
Lymphs: 50 %
MCH: 28.5 pg (ref 26.6–33.0)
MCHC: 33.6 g/dL (ref 31.5–35.7)
MCV: 85 fL (ref 79–97)
MONOS ABS: 0.4 10*3/uL (ref 0.1–0.9)
Monocytes: 10 %
NEUTROS PCT: 35 %
Neutrophils Absolute: 1.3 10*3/uL — ABNORMAL LOW (ref 1.4–7.0)
Platelets: 277 10*3/uL (ref 150–379)
RBC: 4.42 x10E6/uL (ref 4.14–5.80)
RDW: 14.6 % (ref 12.3–15.4)
WBC: 3.8 10*3/uL (ref 3.4–10.8)

## 2015-01-23 LAB — LIPID PANEL
CHOLESTEROL TOTAL: 209 mg/dL — AB (ref 100–199)
Chol/HDL Ratio: 5.4 ratio units — ABNORMAL HIGH (ref 0.0–5.0)
HDL: 39 mg/dL — ABNORMAL LOW (ref >39)
LDL CALC: 151 mg/dL — AB (ref 0–99)
Triglycerides: 97 mg/dL (ref 0–149)
VLDL CHOLESTEROL CAL: 19 mg/dL (ref 5–40)

## 2015-01-23 LAB — CMP14+EGFR
ALBUMIN: 4.4 g/dL (ref 3.5–5.5)
ALK PHOS: 57 IU/L (ref 39–117)
ALT: 31 IU/L (ref 0–44)
AST: 15 IU/L (ref 0–40)
Albumin/Globulin Ratio: 1.2 (ref 1.1–2.5)
BUN / CREAT RATIO: 11 (ref 8–19)
BUN: 10 mg/dL (ref 6–20)
Bilirubin Total: 1 mg/dL (ref 0.0–1.2)
CALCIUM: 9.3 mg/dL (ref 8.7–10.2)
CO2: 23 mmol/L (ref 18–29)
CREATININE: 0.87 mg/dL (ref 0.76–1.27)
Chloride: 96 mmol/L — ABNORMAL LOW (ref 97–108)
GFR, EST AFRICAN AMERICAN: 131 mL/min/{1.73_m2} (ref >59)
GFR, EST NON AFRICAN AMERICAN: 113 mL/min/{1.73_m2} (ref >59)
GLUCOSE: 86 mg/dL (ref 65–99)
Globulin, Total: 3.6 g/dL (ref 1.5–4.5)
Potassium: 4.6 mmol/L (ref 3.5–5.2)
Sodium: 133 mmol/L — ABNORMAL LOW (ref 134–144)
TOTAL PROTEIN: 8 g/dL (ref 6.0–8.5)

## 2015-01-23 LAB — CUP PACEART REMOTE DEVICE CHECK
Battery Remaining Longevity: 49 mo
Battery Remaining Percentage: 50 %
Battery Voltage: 2.93 V
Brady Statistic AS VS Percent: 76 %
Brady Statistic RV Percent Paced: 1 %
HIGH POWER IMPEDANCE MEASURED VALUE: 44 Ohm
Lead Channel Impedance Value: 430 Ohm
Lead Channel Impedance Value: 450 Ohm
Lead Channel Pacing Threshold Amplitude: 1.25 V
Lead Channel Pacing Threshold Pulse Width: 0.9 ms
Lead Channel Sensing Intrinsic Amplitude: 11.7 mV
Lead Channel Setting Pacing Amplitude: 2.5 V
Lead Channel Setting Pacing Amplitude: 2.5 V
Lead Channel Setting Sensing Sensitivity: 0.5 mV
MDC IDC MSMT LEADCHNL RA SENSING INTR AMPL: 2.9 mV
MDC IDC MSMT LEADCHNL RV PACING THRESHOLD AMPLITUDE: 0.75 V
MDC IDC MSMT LEADCHNL RV PACING THRESHOLD PULSEWIDTH: 0.5 ms
MDC IDC PG SERIAL: 621444
MDC IDC SESS DTM: 20160906111008
MDC IDC SET LEADCHNL RV PACING PULSEWIDTH: 0.5 ms
MDC IDC SET ZONE DETECTION INTERVAL: 400 ms
MDC IDC STAT BRADY AP VP PERCENT: 1 %
MDC IDC STAT BRADY AP VS PERCENT: 24 %
MDC IDC STAT BRADY AS VP PERCENT: 1 %
MDC IDC STAT BRADY RA PERCENT PACED: 23 %
Zone Setting Detection Interval: 250 ms
Zone Setting Detection Interval: 285 ms

## 2015-01-23 LAB — THYROID PANEL WITH TSH
Free Thyroxine Index: 2.1 (ref 1.2–4.9)
T3 Uptake Ratio: 27 % (ref 24–39)
T4 TOTAL: 7.6 ug/dL (ref 4.5–12.0)
TSH: 1.67 u[IU]/mL (ref 0.450–4.500)

## 2015-01-23 NOTE — Telephone Encounter (Signed)
Per Dr. Louanne Skye, called in Lipitor 40 mg, one by mouth once daily at bedtime, #90, with no refills.

## 2015-02-10 ENCOUNTER — Other Ambulatory Visit: Payer: Self-pay | Admitting: Family Medicine

## 2015-02-10 ENCOUNTER — Encounter: Payer: Self-pay | Admitting: Pharmacist

## 2015-02-10 ENCOUNTER — Ambulatory Visit (INDEPENDENT_AMBULATORY_CARE_PROVIDER_SITE_OTHER): Payer: Medicare Other | Admitting: Pharmacist

## 2015-02-10 VITALS — BP 108/70 | HR 72 | Ht 77.0 in | Wt 391.6 lb

## 2015-02-10 DIAGNOSIS — E782 Mixed hyperlipidemia: Secondary | ICD-10-CM | POA: Insufficient documentation

## 2015-02-10 DIAGNOSIS — Z0000 Encounter for general adult medical examination without abnormal findings: Secondary | ICD-10-CM

## 2015-02-10 DIAGNOSIS — I480 Paroxysmal atrial fibrillation: Secondary | ICD-10-CM

## 2015-02-10 DIAGNOSIS — E785 Hyperlipidemia, unspecified: Secondary | ICD-10-CM | POA: Diagnosis not present

## 2015-02-10 DIAGNOSIS — Z7901 Long term (current) use of anticoagulants: Secondary | ICD-10-CM | POA: Diagnosis not present

## 2015-02-10 DIAGNOSIS — Z029 Encounter for administrative examinations, unspecified: Secondary | ICD-10-CM

## 2015-02-10 LAB — POCT INR: INR: 3.2

## 2015-02-10 MED ORDER — DIGOXIN 125 MCG PO TABS: 0.1250 mg | ORAL_TABLET | ORAL | Status: DC

## 2015-02-10 MED ORDER — CITALOPRAM HYDROBROMIDE 20 MG PO TABS: ORAL_TABLET | ORAL | Status: DC

## 2015-02-10 NOTE — Progress Notes (Signed)
Patient ID: [name] E Fung, male   DOB: [birth year], 34 y.o.   MRN: [id number]   Subjective:   [name] E Gascoigne is a 34 y.o. black male who presents for a subsequent Medicare Annual Wellness Visit.  Mr. Henard has permanent disability.  He lives in [place], [place] with his wife and middle school aged daughter.  He reports that he has gained weight over the last few months.  He has not been exercising due to the summer heat and has no been adherent to low caloric/low fat diet.  Mr. Russ has also recently started atorvastatin  daily for hyperlipidemia.  He is tolerating well.      Current Medications (verified) Outpatient Encounter Prescriptions as of 02/10/2015  Medication Sig  . ALPRAZolam (XANAX) 1 MG tablet TAKE ONE TABLET BY MOUTH ONCE DAILY AS NEEDED FOR ANXIETY  . atorvastatin (LIPITOR) 40 MG tablet Take 40 mg by mouth daily.  . carvedilol (COREG) 25 MG tablet Take 1&1/2 tablets twice a day  . citalopram (CELEXA) 20 MG tablet TAKE ONE TABLET BY MOUTH ONCE DAILY  . digoxin (LANOXIN) 0.125 MG tablet Take 1 tablet (0.125 mg total) by mouth every other day.  . fluticasone (FLONASE) 50 MCG/ACT nasal spray Place 1 spray into both nostrils at bedtime.  . furosemide (LASIX) 40 MG tablet Take 1 tablet (40 mg total) by mouth daily as needed for fluid.  Marland Kitchen lisinopril (PRINIVIL,ZESTRIL) 20 MG tablet Take 1 tablet (20 mg total) by mouth 2 (two) times daily.  Marland Kitchen spironolactone (ALDACTONE) 25 MG tablet Take 1 tablet (25 mg total) by mouth daily.  Marland Kitchen triamcinolone cream (KENALOG) 0.1 % Apply topically 2 (two) times daily as needed.  . warfarin (COUMADIN) 5 MG tablet TAKE ONE TABLET BY MOUTH ONCE DAILY AS  DIRECTED  BY  ANTICOAGULATION  CLINIC  . [DISCONTINUED] citalopram (CELEXA) 20 MG tablet TAKE ONE TABLET BY MOUTH ONCE DAILY  . [DISCONTINUED] digoxin (LANOXIN) 0.125 MG tablet Take 1 tablet (0.125 mg total) by mouth every other day.  Marland Kitchen azelastine (ASTELIN) 0.1 % nasal spray Place 2 sprays into both nostrils at  bedtime. Use in each nostril as directed (Patient not taking: Reported on 02/10/2015)   No facility-administered encounter medications on file as of 02/10/2015.    Allergies (verified) Review of patient's allergies indicates no known allergies.   History: Past Medical History  Diagnosis Date  . Chronic systolic congestive heart failure (HCC)     a. suspected NICM - dx 2010. EF 15% by TEE, 10-20% by echo at that time. b. s/p St. Jude AICD 2011. c. Echo 01/2010: mod dilated LV, EF 30%, mod aortic root dilitation, no significant valvular disease.  Marland Kitchen Hypertension   . Eczema   . PAF (paroxysmal atrial fibrillation) (HCC)   . Automatic implantable cardioverter-defibrillator in situ     a. St Jude in 2011.  Marland Kitchen Chronic anticoagulation   . Nonischemic cardiomyopathy (HCC)   . Eczema   . Paroxysmal atrial flutter (HCC)     s/p ablation  . Mobitz type 2 second degree atrioventricular block     a. During sleeping hours in 2010 suspected due to ? OSA.  . Morbid obesity (HCC)   . Atypical atrial flutter (HCC) 08/19/14    pace terminated in device clinic.  AFL cycle length was 390 msec  . Allergy   . Anxiety    Past Surgical History  Procedure Laterality Date  . Cardiac defibrillator placement  02/17/10    [employer] [phone],  model number [id number], serial number [id number]  . Tooth extraction N/A 10/23/2012    Procedure: EXTRACTION TEETH 1, 16, 17, 30, 31;  Surgeon: [name], DDS;  Location: [facility] OR;  Service: Oral Surgery;  Laterality: N/A;  . Cardioversion N/A 12/29/2013    Procedure: CARDIOVERSION;  Surgeon: Duke Salvia, MD;  Location: [facility] OR;  Service: Cardiovascular;  Laterality: N/A;  . Atrial flutter ablation N/A 01/18/2014    Procedure: ATRIAL FLUTTER ABLATION;  Surgeon: Duke Salvia, MD;  Location[facility] CATH LAB;  Service: Cardiovascular;  Laterality: N/A;   Family History  Problem Relation Age of Onset  . Cancer Brother 18    hodgekins lymphoma  . Heart disease Maternal  Grandfather   . Diabetes Paternal Grandmother   . Heart disease Mother     artial flutter   Social History   Occupational History  . Not on file.   Social History Main Topics  . Smoking status: Never Smoker   . Smokeless tobacco: Never Used  . Alcohol Use: No  . Drug Use: No  . Sexual Activity: Yes    Do you feel safe at home?  Yes  Dietary issues and exercise activities discussed: Current Exercise Habits:: The patient does not participate in regular exercise at present  Current Dietary habits:  Not following recommended diet.  He reports he drinks mostly water.     Cardiac Risk Factors include: dyslipidemia;male gender;obesity (BMI >30kg/m2);hypertension;sedentary lifestyle  Objective:    Today's Vitals   02/10/15 0937  BP: 108/70  Pulse: 72  Height: 6\' 5"  (1.956 m)  Weight: 391 lb 9.6 oz (177.629 kg)  PainSc: 0-No pain   Body mass index is 46.43 kg/(m^2).   INR in office today was 3.2   Activities of Daily Living In your present state of health, do you have any difficulty performing the following activities: 02/10/2015  Hearing? N  Vision? N  Difficulty concentrating or making decisions? N  Walking or climbing stairs? N  Dressing or bathing? N  Doing errands, shopping? N  Preparing Food and eating ? N  Using the Toilet? N  In the past six months, have you accidently leaked urine? N  Do you have problems with loss of bowel control? N  Managing your Medications? N  Managing your Finances? N  Housekeeping or managing your Housekeeping? N    Are there smokers in your home (other than you)? No    Depression Screen PHQ 2/9 Scores 02/10/2015 01/22/2015 12/10/2013 11/19/2013  PHQ - 2 Score 0 0 0 1    Fall Risk Fall Risk  02/10/2015 12/10/2013 11/19/2013  Falls in the past year? No No No    Cognitive Function: MMSE - Mini Mental State Exam 02/10/2015  Orientation to time 5  Orientation to Place 5  Registration 3  Attention/ Calculation 5  Recall 2    Language- name 2 objects 2  Language- repeat 1  Language- follow 3 step command 3  Language- read & follow direction 1  Write a sentence 1  Copy design 1  Total score 29    Immunizations and Health Maintenance Immunization History  Administered Date(s) Administered  . Influenza,inj,Quad PF,36+ Mos 02/07/2013  . Influenza-Unspecified 02/06/2014  . Pneumococcal Polysaccharide-23 12/28/2013  . Td 01/18/2012   There are no preventive care reminders to display for this patient.  Patient Care Team: Ernestina Penna, MD as PCP - General (Family Medicine) Thurmon Fair, MD as Consulting Physician (Cardiology) Hillis Range, MD as Consulting Physician (  Cardiology)  Indicate any recent Medical Services you may have received from other than [facility] providers in the past year (date may be approximate).    Assessment:    Annual Wellness Visit - subsequent Supratherapeutic anticoagulation Obesity Hyperlipidemia   Screening Tests Health Maintenance  Topic Date Due  . INFLUENZA VACCINE  02/21/2015 (Originally 12/09/2014)  . HIV Screening  01/22/2016 (Originally 07/21/1996)  . PNEUMOCOCCAL POLYSACCHARIDE VACCINE (2) 12/29/2018  . TETANUS/TDAP  01/17/2022        Plan:   During the course of the visit [name] was educated and counseled about the following appropriate screening and preventive services:   Vaccines to include Pneumoccal, Influenza, Hepatitis B, Td, Zostavax - patient declined influenza vaccine because he is going out of town and is afraid of side effects.  I explained that they are rare and mild but he still refused.  He did agree to get at next visit in 1 month.  UTD on all other vaccines  DIscussed HIV screening - patient declined today but will consider with next lab draw.  Colorectal cancer screening - no at age that this is required  Cardiovascular disease screening - sees cardiologist regularly.   BP at goal with current therapy   Continue lipitor  daily.    Nutrition Counseling - Mediterranean Diet discussed - encouraged to increase fruite and vegetables, limit sweets, processed foods, breads and red meat.  Increase whole grains and lean proteins.  Discussed appropriate portion sizes.  Suggested using smaller plates and bowls.    Increase physical activity - discussed various ways to increase exercise.  Start with 10 minutes daily and work up to Goal of 30 to 40 minutes daily for at least 4 days per week.  Patient is advised to stop if he experiences any pain or discomfort.    Diabetes screening - last FBG was 86  Prostate cancer screening - not at age that this is required.   rx sent in for digoxin and citalopram   Will check lipid panel and dig level at next appt. In 1 month  Goal is to lose 5# by next visit in 1 month.   Anticoagulation Dose Instructions as of 02/10/2015      Glynis Smiles Tue Wed Thu Fri Sat   New Dose 2.5 mg 5 mg 5 mg 5 mg 5 mg 5 mg 2.5 mg    Description        No warfarin today - Monday, October 3rd, then continue warfarin at current dose of 1/2 tablet on saturdays and sundays. take 1 tablet all other days.      Recheck in 1 month  Goals    None       Patient Instructions (the written plan) were given to the patient.   Henrene Pastor, [facility]   02/10/2015

## 2015-02-10 NOTE — Patient Instructions (Addendum)
  [name] , Thank you for taking time to come for your Medicare Wellness Visit. I appreciate your ongoing commitment to your health goals. Please review the following plan we discussed and let me know if I can assist you in the future.   These are the goals we discussed:  Increase non-starchy vegetables - carrots, green bean, squash, zucchini, tomatoes, onions, peppers, lettuce, cucumbers (make sure you peel them) okra (not fried), eggplant limit sugar and processed foods (cakes, cookies, ice cream, crackers and chips) Increase fresh fruit but limit serving sizes 1/2 cup or about the size of tennis or baseball limit red meat to no more than 1-2 times per week (serving size about the size of your palm) Choose whole grains / lean proteins - whole wheat bread, quinoa, whole grain rice (1/2 cup), fish, chicken, [place]  Increase exercise as well - try to work up to 30 minutes daily at least 5 days per week.    This is a list of the screening recommended for you and due dates:  Health Maintenance  Topic Date Due  . Flu Shot  02/21/2015  - plan to get at appt in November  . HIV Screening  01/22/2016*  . Pneumococcal vaccine (2) 12/29/2018  . Tetanus Vaccine  01/17/2022  *Topic was postponed. The date shown is not the original due date.    Anticoagulation Dose Instructions as of 02/10/2015      Glynis Smiles Tue Wed Thu Fri Sat   New Dose 2.5 mg 5 mg 5 mg 5 mg 5 mg 5 mg 2.5 mg    Description        No warfarin today - Monday, October 3rd, then continue warfarin at current dose of 1/2 tablet on saturdays and sundays. take 1 tablet all other days.

## 2015-02-12 ENCOUNTER — Encounter: Payer: Self-pay | Admitting: Cardiology

## 2015-02-18 ENCOUNTER — Encounter: Payer: Self-pay | Admitting: Internal Medicine

## 2015-02-26 ENCOUNTER — Encounter: Payer: Self-pay | Admitting: Pharmacist

## 2015-02-26 ENCOUNTER — Telehealth: Payer: Self-pay | Admitting: Cardiovascular Disease

## 2015-02-26 NOTE — Telephone Encounter (Signed)
New message      Pt sent in a transmission this am.  He states he had an "episode" yesterday and want to know if anything showed up.  Please call

## 2015-02-26 NOTE — Telephone Encounter (Signed)
Patient reports that he woke up this morning feeling like he had indigestion and wondered if anything was recorded on his ICD. 44 seconds of SVT noted at 4:22am, and AT episodes noted between 4-4:30 am. Pt made aware. He is curious if there are any particular foods he is eating that might be causing these episodes. I advised him to take OTC antacids if he felt like he had indigestion. He is agreeable. He also reports that he "panicked" during this time frame. Pt take xanax, reports that he has not altered his medication regimen.  Confirmed remote transmission on Dec 7th.

## 2015-03-03 ENCOUNTER — Ambulatory Visit: Payer: Self-pay | Admitting: Pharmacist

## 2015-03-13 ENCOUNTER — Encounter: Payer: Self-pay | Admitting: Pharmacist

## 2015-03-26 ENCOUNTER — Encounter: Payer: Self-pay | Admitting: Pharmacist

## 2015-03-26 ENCOUNTER — Ambulatory Visit (INDEPENDENT_AMBULATORY_CARE_PROVIDER_SITE_OTHER): Payer: Medicare Other | Admitting: Pharmacist

## 2015-03-26 VITALS — Ht 77.0 in | Wt 390.4 lb

## 2015-03-26 DIAGNOSIS — I480 Paroxysmal atrial fibrillation: Secondary | ICD-10-CM

## 2015-03-26 DIAGNOSIS — Z23 Encounter for immunization: Secondary | ICD-10-CM | POA: Diagnosis not present

## 2015-03-26 DIAGNOSIS — E8881 Metabolic syndrome: Secondary | ICD-10-CM | POA: Diagnosis not present

## 2015-03-26 DIAGNOSIS — E785 Hyperlipidemia, unspecified: Secondary | ICD-10-CM | POA: Diagnosis not present

## 2015-03-26 DIAGNOSIS — Z79899 Other long term (current) drug therapy: Secondary | ICD-10-CM

## 2015-03-26 DIAGNOSIS — Z7901 Long term (current) use of anticoagulants: Secondary | ICD-10-CM

## 2015-03-26 LAB — POCT INR: INR: 2.2

## 2015-03-26 NOTE — Progress Notes (Signed)
Patient ID: Danny Hinton, male   DOB: November 21, 1980, 34 y.o.   MRN: [id number]   Subjective:   Danny Hinton is a 34 y.o. black male who presents for follow up chronic anticoagulation / protime recheck, hyperlipidemia and metabolic syndrome / obesity. Mr. Roethler has permanent disability.  He lives in [place], [place] with his wife and middle school aged daughter.  Chronic anticoagulation  - deneis s/s of bleeding, no changes in medications. Current warfarin dose =  tablets - take 1/2 tablet on saturdays and Sunday and 1 tablet all other days.   Hyperlipidemia- restarted atornvastatin  daily 01/2015 - tolerating well.    Metabolic Syndrome / obesity - patient has been trying to limit portion sizes and increasing fruit intake.  He has been more active over the last month - hunting and walking more.     Current Medications (verified) Outpatient Encounter Prescriptions as of 03/26/2015  Medication Sig  . ALPRAZolam (XANAX) 1 MG tablet TAKE ONE TABLET BY MOUTH ONCE DAILY AS NEEDED FOR ANXIETY  . atorvastatin (LIPITOR) 40 MG tablet Take 40 mg by mouth daily.  Marland Kitchen azelastine (ASTELIN) 0.1 % nasal spray Place 2 sprays into both nostrils at bedtime. Use in each nostril as directed  . carvedilol (COREG) 25 MG tablet Take 1&1/2 tablets twice a day  . citalopram (CELEXA) 20 MG tablet TAKE ONE TABLET BY MOUTH ONCE DAILY  . digoxin (LANOXIN) 0.125 MG tablet Take 1 tablet (0.125 mg total) by mouth every other day.  . fluticasone (FLONASE) 50 MCG/ACT nasal spray Place 1 spray into both nostrils as needed.   . furosemide (LASIX) 40 MG tablet Take 1 tablet (40 mg total) by mouth daily as needed for fluid.  Marland Kitchen lisinopril (PRINIVIL,ZESTRIL) 20 MG tablet Take 1 tablet (20 mg total) by mouth 2 (two) times daily.  Marland Kitchen spironolactone (ALDACTONE) 25 MG tablet Take 1 tablet (25 mg total) by mouth daily.  Marland Kitchen triamcinolone cream (KENALOG) 0.1 % Apply topically 2 (two) times daily as needed.  . warfarin (COUMADIN) 5 MG tablet TAKE  ONE TABLET BY MOUTH ONCE DAILY AS  DIRECTED  BY  ANTICOAGULATION  CLINIC   No facility-administered encounter medications on file as of 03/26/2015.    Allergies (verified) Review of patient's allergies indicates no known allergies.   History: Past Medical History  Diagnosis Date  . Chronic systolic congestive heart failure (HCC)     a. suspected NICM - dx 2010. EF 15% by TEE, 10-20% by echo at that time. b. s/p St. Jude AICD 2011. c. Echo 01/2010: mod dilated LV, EF 30%, mod aortic root dilitation, no significant valvular disease.  Marland Kitchen Hypertension   . Eczema   . PAF (paroxysmal atrial fibrillation) (HCC)   . Automatic implantable cardioverter-defibrillator in situ     a. St Jude in 2011.  Marland Kitchen Chronic anticoagulation   . Nonischemic cardiomyopathy (HCC)   . Eczema   . Paroxysmal atrial flutter (HCC)     s/p ablation  . Mobitz type 2 second degree atrioventricular block     a. During sleeping hours in 2010 suspected due to ? OSA.  . Morbid obesity (HCC)   . Atypical atrial flutter (HCC) 08/19/14    pace terminated in device clinic.  AFL cycle length was 390 msec  . Allergy   . Anxiety    Past Surgical History  Procedure Laterality Date  . Cardiac defibrillator placement  02/17/10    St. Jude Medical 45DR, model number [id number], serial number [id number]  .  Tooth extraction N/A 10/23/2012    Procedure: EXTRACTION TEETH 1, 16, 17, 30, 31;  Surgeon: [name], DDS;  Location: [facility] OR;  Service: Oral Surgery;  Laterality: N/A;  . Cardioversion N/A 12/29/2013    Procedure: CARDIOVERSION;  Surgeon: Duke Salvia, MD;  Location: [facility] OR;  Service: Cardiovascular;  Laterality: N/A;  . Atrial flutter ablation N/A 01/18/2014    Procedure: ATRIAL FLUTTER ABLATION;  Surgeon: Duke Salvia, MD;  Location: [facility] CATH LAB;  Service: Cardiovascular;  Laterality: N/A;   Family History  Problem Relation Age of Onset  . Cancer Brother 18    hodgekins lymphoma  . Heart disease Maternal Grandfather   .  Diabetes Paternal Grandmother   . Heart disease Mother     artial flutter   Social History   Occupational History  . Not on file.   Social History Main Topics  . Smoking status: Never Smoker   . Smokeless tobacco: Never Used  . Alcohol Use: No  . Drug Use: No  . Sexual Activity: Yes      Objective:    Today's Vitals   03/26/15 0825  Height: 6\' 5"  (1.956 m)  Weight: 390 lb 6.4 oz (177.084 kg)   Body mass index is 46.29 kg/(m^2).   INR in office today was 2.2   Assessment:    Therapeutic anticoagulation Obesity / Metabolic Syndrome - has lost almost 2# since last visit Hyperlipidemia     Plan:   During the course of the visit Danny was educated and counseled about the following appropriate screening and preventive services:   Influenza vaccine given in office today  Continue lipitor 40mg  daily - checking lipids and LFTs today - pending  Nutrition Counseling - continue with limiting portion sizes  Continue with Increased physical activity   See order below  Orders Placed This Encounter  Procedures  . Digoxin level  . Lipid panel  . Hepatic function panel  . LDL cholesterol, direct    Anticoagulation Dose Instructions as of 03/26/2015      Glynis Smiles Tue Wed Thu Fri Sat   New Dose 2.5 mg 5 mg 5 mg 5 mg 5 mg 5 mg 2.5 mg    Description        Continue warfarin at current dose of 1/2 tablet on saturdays and sundays. take 1 tablet all other days.      Recheck in 1 month   Henrene Pastor, [facility]   03/26/2015

## 2015-03-26 NOTE — Patient Instructions (Signed)
Anticoagulation Dose Instructions as of 03/26/2015      Danny Hinton Tue Wed Thu Fri Sat   New Dose 2.5 mg 5 mg 5 mg 5 mg 5 mg 5 mg 2.5 mg    Description        Continue warfarin at current dose of 1/2 tablet on saturdays and sundays. take 1 tablet all other days.

## 2015-03-27 LAB — HEPATIC FUNCTION PANEL
ALT: 46 IU/L — AB (ref 0–44)
AST: 22 IU/L (ref 0–40)
Albumin: 4 g/dL (ref 3.5–5.5)
Alkaline Phosphatase: 60 IU/L (ref 39–117)
BILIRUBIN TOTAL: 0.6 mg/dL (ref 0.0–1.2)
Bilirubin, Direct: 0.16 mg/dL (ref 0.00–0.40)
TOTAL PROTEIN: 7.3 g/dL (ref 6.0–8.5)

## 2015-03-27 LAB — DIGOXIN LEVEL: Digoxin, Serum: 0.4 ng/mL — ABNORMAL LOW (ref 0.9–2.0)

## 2015-03-27 LAB — LIPID PANEL
CHOL/HDL RATIO: 3.5 ratio (ref 0.0–5.0)
Cholesterol, Total: 87 mg/dL — ABNORMAL LOW (ref 100–199)
HDL: 25 mg/dL — ABNORMAL LOW (ref >39)
LDL CALC: 51 mg/dL (ref 0–99)
TRIGLYCERIDES: 57 mg/dL (ref 0–149)
VLDL Cholesterol Cal: 11 mg/dL (ref 5–40)

## 2015-03-27 LAB — LDL CHOLESTEROL, DIRECT: LDL Direct: 52 mg/dL (ref 0–99)

## 2015-03-29 ENCOUNTER — Telehealth: Payer: Self-pay | Admitting: Family Medicine

## 2015-03-31 NOTE — Telephone Encounter (Signed)
Reviewed labs with pt. 

## 2015-04-12 ENCOUNTER — Telehealth: Payer: Self-pay | Admitting: *Deleted

## 2015-04-12 MED ORDER — WARFARIN SODIUM 5 MG PO TABS: ORAL_TABLET | ORAL | Status: DC

## 2015-04-12 NOTE — Telephone Encounter (Signed)
Patient requesting refill on Coumadin. Chart reviewed and medication refilled per request.

## 2015-04-16 ENCOUNTER — Telehealth: Payer: Self-pay | Admitting: Cardiology

## 2015-04-16 ENCOUNTER — Ambulatory Visit (INDEPENDENT_AMBULATORY_CARE_PROVIDER_SITE_OTHER): Payer: Medicare Other | Admitting: *Deleted

## 2015-04-16 DIAGNOSIS — I429 Cardiomyopathy, unspecified: Secondary | ICD-10-CM

## 2015-04-16 DIAGNOSIS — I428 Other cardiomyopathies: Secondary | ICD-10-CM

## 2015-04-16 DIAGNOSIS — I5022 Chronic systolic (congestive) heart failure: Secondary | ICD-10-CM

## 2015-04-16 NOTE — Telephone Encounter (Signed)
LMOVM reminding pt to send remote transmission.   

## 2015-04-17 ENCOUNTER — Telehealth: Payer: Self-pay | Admitting: Cardiovascular Disease

## 2015-04-17 ENCOUNTER — Encounter: Payer: Self-pay | Admitting: Cardiology

## 2015-04-17 NOTE — Telephone Encounter (Signed)
°  1. Has your device fired? No  2. Is you device beeping? no  3. Are you experiencing draining or swelling at device site? no  4. Are you calling to see if we received your device transmission? Yes, did you receive any alerts  5. Have you passed out? no

## 2015-04-17 NOTE — Progress Notes (Signed)
Remote ICD transmission.   

## 2015-04-17 NOTE — Telephone Encounter (Signed)
LMOVM informing pt that transmission was received and there was no alerts at the first glance.

## 2015-04-23 ENCOUNTER — Ambulatory Visit (INDEPENDENT_AMBULATORY_CARE_PROVIDER_SITE_OTHER): Payer: Medicare Other | Admitting: Pharmacist

## 2015-04-23 DIAGNOSIS — I480 Paroxysmal atrial fibrillation: Secondary | ICD-10-CM | POA: Diagnosis not present

## 2015-04-23 DIAGNOSIS — R7989 Other specified abnormal findings of blood chemistry: Secondary | ICD-10-CM | POA: Diagnosis not present

## 2015-04-23 DIAGNOSIS — Z7901 Long term (current) use of anticoagulants: Secondary | ICD-10-CM

## 2015-04-23 DIAGNOSIS — R945 Abnormal results of liver function studies: Secondary | ICD-10-CM

## 2015-04-23 LAB — POCT INR: INR: 1.8

## 2015-04-23 NOTE — Patient Instructions (Signed)
Anticoagulation Dose Instructions as of 04/23/2015      Danny Hinton Tue Wed Thu Fri Sat   New Dose 2.5 mg 5 mg 5 mg 5 mg 5 mg 5 mg 2.5 mg    Description        Take 1 and 1/2 tablets today - Wednesday, December 14th - then resume regular warfarin dose of 1/2 tablet on saturdays and sundays. take 1 tablet all other days.      INR was 1.8 today

## 2015-04-24 ENCOUNTER — Encounter: Payer: Self-pay | Admitting: Pharmacist

## 2015-04-24 LAB — CUP PACEART REMOTE DEVICE CHECK
Brady Statistic AP VS Percent: 30 %
Brady Statistic AS VP Percent: 1 %
Brady Statistic RV Percent Paced: 1 %
HighPow Impedance: 47 Ohm
Implantable Lead Implant Date: 20111010
Implantable Lead Location: 753860
Lead Channel Impedance Value: 450 Ohm
Lead Channel Pacing Threshold Amplitude: 1.25 V
Lead Channel Pacing Threshold Pulse Width: 0.5 ms
Lead Channel Sensing Intrinsic Amplitude: 11.7 mV
Lead Channel Setting Pacing Amplitude: 2.5 V
Lead Channel Setting Pacing Amplitude: 2.5 V
MDC IDC LEAD IMPLANT DT: 20111010
MDC IDC LEAD LOCATION: 753859
MDC IDC MSMT BATTERY REMAINING LONGEVITY: 46 mo
MDC IDC MSMT BATTERY REMAINING PERCENTAGE: 47 %
MDC IDC MSMT BATTERY VOLTAGE: 2.92 V
MDC IDC MSMT LEADCHNL RA PACING THRESHOLD PULSEWIDTH: 0.9 ms
MDC IDC MSMT LEADCHNL RA SENSING INTR AMPL: 3.6 mV
MDC IDC MSMT LEADCHNL RV IMPEDANCE VALUE: 440 Ohm
MDC IDC MSMT LEADCHNL RV PACING THRESHOLD AMPLITUDE: 0.75 V
MDC IDC SESS DTM: 20161208160535
MDC IDC SET LEADCHNL RV PACING PULSEWIDTH: 0.5 ms
MDC IDC SET LEADCHNL RV SENSING SENSITIVITY: 0.5 mV
MDC IDC STAT BRADY AP VP PERCENT: 1 %
MDC IDC STAT BRADY AS VS PERCENT: 69 %
MDC IDC STAT BRADY RA PERCENT PACED: 29 %
Pulse Gen Serial Number: 621444

## 2015-04-24 LAB — HEPATIC FUNCTION PANEL
ALK PHOS: 61 IU/L (ref 39–117)
ALT: 39 IU/L (ref 0–44)
AST: 18 IU/L (ref 0–40)
Albumin: 4 g/dL (ref 3.5–5.5)
BILIRUBIN, DIRECT: 0.16 mg/dL (ref 0.00–0.40)
Bilirubin Total: 0.7 mg/dL (ref 0.0–1.2)
Total Protein: 7.1 g/dL (ref 6.0–8.5)

## 2015-04-25 ENCOUNTER — Other Ambulatory Visit: Payer: Self-pay | Admitting: Family Medicine

## 2015-04-28 ENCOUNTER — Telehealth: Payer: Self-pay | Admitting: Cardiology

## 2015-04-28 NOTE — Telephone Encounter (Signed)
Spoke with pt and requested that he send a remote transmission w/ home monitor b/c his monitor has not updated in at least 8 days. Pt verbalized understanding.

## 2015-04-30 ENCOUNTER — Encounter: Payer: Self-pay | Admitting: Cardiology

## 2015-05-09 ENCOUNTER — Telehealth: Payer: Self-pay | Admitting: Cardiovascular Disease

## 2015-05-09 NOTE — Telephone Encounter (Signed)
Spoke w/ pt and informed him that we received his transmission on 05-02-15 and there was no alerts and no episodes. Pt verbalized understanding. Informed pt that he has a recall on his ICD and that we are tracking his home monitor. If it doesn't  Update in 8 days we are requesting that he send a remote transmission. Pt verbalized understanding and is aware that his 06-13-15 appt is to talk about the recall.

## 2015-05-09 NOTE — Telephone Encounter (Signed)
New Message  Pt stated he sent remote transmission over the holiday and wanted to know if it was sent successfully. Please call back and discuss.

## 2015-05-11 ENCOUNTER — Other Ambulatory Visit: Payer: Self-pay | Admitting: Family Medicine

## 2015-05-15 ENCOUNTER — Encounter: Payer: Self-pay | Admitting: Cardiology

## 2015-05-26 ENCOUNTER — Encounter: Payer: Self-pay | Admitting: Pharmacist

## 2015-05-26 ENCOUNTER — Telehealth: Payer: Self-pay | Admitting: Cardiology

## 2015-05-26 NOTE — Telephone Encounter (Signed)
Spoke w/ pt and requested that he send a manual transmission from home monitor b/c it has not updated in at least 8 days. Pt verbalized understanding.

## 2015-05-27 ENCOUNTER — Telehealth: Payer: Self-pay | Admitting: Cardiovascular Disease

## 2015-05-27 ENCOUNTER — Encounter: Payer: Self-pay | Admitting: Family Medicine

## 2015-05-27 NOTE — Telephone Encounter (Signed)
LMOVM for pt to return call 

## 2015-05-27 NOTE — Telephone Encounter (Signed)
Spoke w/ pt and he informed me that he has moved his monitor to several different electrical outlets and the monitor will not get any power. Instructed pt to call tech services. Pt verbalized understanding.

## 2015-05-27 NOTE — Telephone Encounter (Signed)
°  1. Has your device fired? no  2. Is you device beeping? no  3. Are you experiencing draining or swelling at device site? no  4. Are you calling to see if we received your device transmission? no  5. Have you passed out? No   Pt stated his transmitter won't come on. Please call pt.

## 2015-06-02 ENCOUNTER — Telehealth: Payer: Self-pay | Admitting: Cardiovascular Disease

## 2015-06-02 NOTE — Telephone Encounter (Signed)
°  1. Has your device fired? no  2. Is you device beeping? no  3. Are you experiencing draining or swelling at device site? no  4. Are you calling to see if we received your device transmission? yes  5. Have you passed out? No   Pt also want to know if there were any alerts.

## 2015-06-02 NOTE — Telephone Encounter (Signed)
Returned patient's call.  He set his Engineer, materials up yesterday and wanted to ensure his manual transmission had been received.  Advised patient that transmission was received and there were no alerts.  Patient is appreciative.  Confirmed patient's appointment with Dr. Royann Shivers on 06/13/15.  Patient denies any additional questions or concerns at this time.

## 2015-06-11 ENCOUNTER — Telehealth: Payer: Self-pay | Admitting: Cardiology

## 2015-06-11 NOTE — Telephone Encounter (Signed)
Spoke w/ pt and requested that he send a remote transmission using his home monitor b/c his home monitor has not updated in at least 8 days. Pt verbalized understanding.

## 2015-06-12 ENCOUNTER — Other Ambulatory Visit: Payer: Self-pay | Admitting: Family Medicine

## 2015-06-13 ENCOUNTER — Encounter: Payer: Self-pay | Admitting: Cardiovascular Disease

## 2015-06-13 ENCOUNTER — Ambulatory Visit (INDEPENDENT_AMBULATORY_CARE_PROVIDER_SITE_OTHER): Payer: Medicare Other | Admitting: Cardiovascular Disease

## 2015-06-13 VITALS — BP 94/61 | HR 72 | Ht 78.0 in | Wt 392.5 lb

## 2015-06-13 DIAGNOSIS — I429 Cardiomyopathy, unspecified: Secondary | ICD-10-CM

## 2015-06-13 DIAGNOSIS — I480 Paroxysmal atrial fibrillation: Secondary | ICD-10-CM

## 2015-06-13 DIAGNOSIS — I428 Other cardiomyopathies: Secondary | ICD-10-CM

## 2015-06-13 DIAGNOSIS — I5022 Chronic systolic (congestive) heart failure: Secondary | ICD-10-CM | POA: Diagnosis not present

## 2015-06-13 DIAGNOSIS — Z95810 Presence of automatic (implantable) cardiac defibrillator: Secondary | ICD-10-CM

## 2015-06-13 DIAGNOSIS — Z7901 Long term (current) use of anticoagulants: Secondary | ICD-10-CM | POA: Diagnosis not present

## 2015-06-13 DIAGNOSIS — E785 Hyperlipidemia, unspecified: Secondary | ICD-10-CM

## 2015-06-13 DIAGNOSIS — I484 Atypical atrial flutter: Secondary | ICD-10-CM

## 2015-06-13 DIAGNOSIS — I10 Essential (primary) hypertension: Secondary | ICD-10-CM

## 2015-06-13 NOTE — Patient Instructions (Signed)
Your physician encouraged you to lose weight for better health.  Remote monitoring is used to monitor your ICD from home. This monitoring reduces the number of office visits required to check your device to one time per year. It allows [place] to monitor the functioning of your device to ensure it is working properly. You are scheduled for a device check from home on Sep 11, 2015. You may send your transmission at any time that day. If you have a wireless device, the transmission will be sent automatically. After your physician reviews your transmission, you will receive a postcard with your next transmission date.  Dr. Royann Shivers recommends that you schedule a follow-up appointment in: 6 MONTHS WITH DEVICE CHECK (ST JUDE).

## 2015-06-13 NOTE — Progress Notes (Signed)
Patient ID: Danny Hinton, male   DOB: January 22, 1981, 35 y.o.   MRN: [id number]    Cardiology Office Note    Date:  06/13/2015   ID:  Danny Hinton, DOB [birth year], MRN [id number]  PCP:  Rudi Heap, MD  Cardiologist:   Thurmon Fair, MD   Chief Complaint  Patient presents with  . Follow-up    No chest pain or edema.  Occas. SOB with activity.  Occas. positional dizziness.    History of Present Illness:  Danny Hinton is a 35 y.o. male who presents for nonischemic cardiomyopathy, well compensated chronic systolic heart failure, defibrillator checkup, history of atrial flutter, atrial fibrillation and paroxysmal atrial tachycardia.  Danny is generally feeling quite well and denies complaints of angina, dyspnea, syncope. He goes hunting without any difficulty. He notices a little breathlessness when he lifts 150 pound hog carcasses. He has infrequent issues with orthostatic hypotension. He has not required any furosemide in several months. He estimates that he has only taken it once since his last appointment in June 2016. He has not had any bleeding complications on chronic anticoagulation with warfarin, usually followed by Dr. Christell Constant. He missed his last warfarin check appointment.  In September 2015 he underwent cavotricuspid isthmus ablation for atrial flutter. On 08/19/2014, while [place] hunting he develop persistent rapid palpitations and felt unwell. He underwent successful overdrive pacing via his device by Dr. Hillis Range. The rhythm was atypical atrial flutter, cycle length roughly 390 ms. The episode lasted for about 3 hours until he was successfully overdrive paced. In the past he has had paroxysmal atrial fibrillation. The decision was made to ablate his flutter secondary to the occurrence of multiple unnecessary defibrillator shocks in the setting of atrial flutter with rapid ventricular rate.  Interrogation of his device today shows 1 recurrent episode of atrial tachycardia 1:1 AV conduction  at 150 bpm. it happened just 2 evenings ago and he was unaware of it. The episode lasted for only 7 minutes and resolved spontaneously. Estimated generates a longevity is 3.3-4.1 years. Lead parameters are excellent. Atrial pacing occurs 30% of the time and he does not require ventricular pacing.  His St. Jude Fortify DR model [id number] defibrillator is under advisory for unexpected battery depletion. He is not device dependent and has never had shocks for true ventricular arrhythmia (although he has had inappropriate shocks for atrial flutter with rapid AV conduction). We discussed the potential for device failure in a low percentage of this particular model. There is no reason to proceed with early generator change out in his particular case.   Past Medical History  Diagnosis Date  . Chronic systolic congestive heart failure (HCC)     a. suspected NICM - dx 2010. EF 15% by TEE, 10-20% by echo at that time. b. s/p St. Jude AICD 2011. c. Echo 01/2010: mod dilated LV, EF 30%, mod aortic root dilitation, no significant valvular disease.  Marland Kitchen Hypertension   . Eczema   . PAF (paroxysmal atrial fibrillation) (HCC)   . Automatic implantable cardioverter-defibrillator in situ     a. St Jude in 2011.  Marland Kitchen Chronic anticoagulation   . Nonischemic cardiomyopathy (HCC)   . Eczema   . Paroxysmal atrial flutter (HCC)     s/p ablation  . Mobitz type 2 second degree atrioventricular block     a. During sleeping hours in 2010 suspected due to ? OSA.  . Morbid obesity (HCC)   . Atypical atrial flutter (HCC) 08/19/14  pace terminated in device clinic.  AFL cycle length was 390 msec  . Allergy   . Anxiety     Past Surgical History  Procedure Laterality Date  . Cardiac defibrillator placement  02/17/10    St. Jude Medical 45DR, model number [id number], serial number [id number]  . Tooth extraction N/A 10/23/2012    Procedure: EXTRACTION TEETH 1, 16, 17, 30, 31;  Surgeon: [name], DDS;  Location: [facility] OR;   Service: Oral Surgery;  Laterality: N/A;  . Cardioversion N/A 12/29/2013    Procedure: CARDIOVERSION;  Surgeon: Duke Salvia, MD;  Location: [facility] OR;  Service: Cardiovascular;  Laterality: N/A;  . Atrial flutter ablation N/A 01/18/2014    Procedure: ATRIAL FLUTTER ABLATION;  Surgeon: Duke Salvia, MD;  Location: [facility] CATH LAB;  Service: Cardiovascular;  Laterality: N/A;    Outpatient Prescriptions Prior to Visit  Medication Sig Dispense Refill  . ALPRAZolam (XANAX) 1 MG tablet TAKE ONE TABLET BY MOUTH ONCE DAILY AS NEEDED FOR ANXIETY 15 tablet 0  . atorvastatin (LIPITOR) 40 MG tablet TAKE ONE TABLET BY MOUTH AT BEDTIME 90 tablet 1  . azelastine (ASTELIN) 0.1 % nasal spray Place 2 sprays into both nostrils at bedtime. Use in each nostril as directed 30 mL 3  . carvedilol (COREG) 25 MG tablet Take 1&1/2 tablets twice a day 100 tablet 6  . citalopram (CELEXA) 20 MG tablet TAKE ONE TABLET BY MOUTH ONCE DAILY 30 tablet 0  . digoxin (LANOXIN) 0.125 MG tablet TAKE ONE TABLET BY MOUTH EVERY OTHER DAY 30 tablet 0  . fluticasone (FLONASE) 50 MCG/ACT nasal spray Place 1 spray into both nostrils as needed.     . furosemide (LASIX) 40 MG tablet Take 1 tablet (40 mg total) by mouth daily as needed for fluid. 30 tablet 3  . lisinopril (PRINIVIL,ZESTRIL) 20 MG tablet Take 1 tablet (20 mg total) by mouth 2 (two) times daily. 180 tablet 3  . spironolactone (ALDACTONE) 25 MG tablet Take 1 tablet (25 mg total) by mouth daily. 90 tablet 3  . triamcinolone cream (KENALOG) 0.1 % Apply topically 2 (two) times daily as needed. 453.6 g 2  . warfarin (COUMADIN) 5 MG tablet TAKE ONE TABLET BY MOUTH ONCE DAILY AS  DIRECTED  BY  ANTICOAGULATION  CLINIC 90 tablet 1   No facility-administered medications prior to visit.     Allergies:   Review of patient's allergies indicates no known allergies.   Social History   Social History  . Marital Status: Married    Spouse Name: N/A  . Number of Children: N/A  . Years of  Education: N/A   Social History Main Topics  . Smoking status: Never Smoker   . Smokeless tobacco: Never Used  . Alcohol Use: No  . Drug Use: No  . Sexual Activity: Yes   Other Topics Concern  . None   Social History Narrative     Family History:  The patient's family history includes Cancer (age of onset: 71) in his brother; Diabetes in his paternal grandmother; Heart disease in his maternal grandfather and mother.   ROS:   Please see the history of present illness.    ROS All other systems reviewed and are negative.   PHYSICAL EXAM:   VS:  BP 94/61 mmHg  Pulse 72  Ht  (1.981 m)  Wt 178.037 kg (392 lb 8 oz)  BMI 45.37 kg/m2   GEN: Well nourished, well developed, in no acute distress HEENT: normal Neck:  no JVD, carotid bruits, or masses Cardiac: RRR; no murmurs, rubs, or gallops,no edema,  Respiratory:  clear to auscultation bilaterally, normal work of breathing GI: soft, nontender, nondistended, + BS MS: no deformity or atrophy Skin: warm and dry, no rash Neuro:  Alert and Oriented x 3, Strength and sensation are intact Psych: euthymic mood, full affect  Wt Readings from Last 3 Encounters:  06/13/15 178.037 kg (392 lb 8 oz)  03/26/15 177.084 kg (390 lb 6.4 oz)  02/10/15 177.629 kg (391 lb 9.6 oz)      Studies/Labs Reviewed:   EKG:  EKG is ordered today.  The ekg ordered today demonstrates sinus rhythm with voltage criteria for LVH, QTC 379 ms  Recent Labs: 01/22/2015: BUN 10; Creatinine, Ser 0.87; Platelets 277; Potassium 4.6; Sodium 133*; TSH 1.670 04/23/2015: ALT 39   Lipid Panel    Component Value Date/Time   CHOL 87* 03/26/2015 0824   CHOL  03/20/2009 0410    170        ATP III CLASSIFICATION:  <200     mg/dL   Desirable  295-621  mg/dL   Borderline High  >=308    mg/dL   High          TRIG 57 03/26/2015 0824   HDL 25* 03/26/2015 0824   HDL 32* 03/20/2009 0410   CHOLHDL 3.5 03/26/2015 0824   CHOLHDL 5.3 03/20/2009 0410   VLDL 22  03/20/2009 0410   LDLCALC 51 03/26/2015 0824   LDLCALC * 03/20/2009 0410    116        Total Cholesterol/HDL:CHD Risk Coronary Heart Disease Risk Table                     Men   Women  1/2 Average Risk   3.4   3.3  Average Risk       5.0   4.4  2 X Average Risk   9.6   7.1  3 X Average Risk  23.4   11.0        Use the calculated Patient Ratio above and the CHD Risk Table to determine the patient's CHD Risk.        ATP III CLASSIFICATION (LDL):  <100     mg/dL   Optimal  657-846  mg/dL   Near or Above                    Optimal  130-159  mg/dL   Borderline  962-952  mg/dL   High  >841     mg/dL   Very High   LDLDIRECT 52 03/26/2015 0824     ASSESSMENT:    1. Chronic systolic congestive heart failure (HCC)   2. Non-ischemic cardiomyopathy (HCC)   3. Paroxysmal atrial fibrillation (HCC)   4. Chronic anticoagulation   5. Atypical atrial flutter (HCC)   6. Automatic implantable cardioverter-defibrillator in situ   7. Morbid obesity, unspecified obesity type (HCC)   8. Hyperlipidemia   9. Essential hypertension      PLAN:  In order of problems listed above:  1. CHF: NYHA functional class I, euvolemic without the use of loop diuretics. He is very disciplined regarding sodium intake. On appropriate high doses of carvedilol and ACE inhibitor. 2. CMP: Most recent LV ejection fraction 20-25 percent by echocardiogram 2015 3. AFib: No recent recurrence. CHADSVasc 2 (CHF, HTN). 4. Well tolerated warfarin anticoagulation. INR checked today and was low at 1.4. Warfarin dose adjusted and follow-up  appointment recommended in 10 days with his primary care provider 5. PAT/atypical flutter: Recently episodes have been very infrequent and have not approached rates at which there is concern for inappropriate defibrillator therapies 6. AICD advisory: Discussed the potential problems with his defibrillator model. He is not pacemaker dependent and has not required appropriate defibrillator  therapy for ventricular tachyarrhythmia. Will continue with frequent battery checks, but no plan for early generator change out at this time. 7. Obesity 8. HLP: Excellent recent lipid profile 9. HTN: His blood pressure is relatively low, but he tolerates it exceptionally well and no changes are made to his medications.  Medication Adjustments/Labs and Tests Ordered: Current medicines are reviewed at length with the patient today.  Concerns regarding medicines are outlined above.  Medication changes, Labs and Tests ordered today are listed in the Patient Instructions below. Patient Instructions  Your physician encouraged you to lose weight for better health.  Remote monitoring is used to monitor your ICD from home. This monitoring reduces the number of office visits required to check your device to one time per year. It allows [place] to monitor the functioning of your device to ensure it is working properly. You are scheduled for a device check from home on Sep 11, 2015. You may send your transmission at any time that day. If you have a wireless device, the transmission will be sent automatically. After your physician reviews your transmission, you will receive a postcard with your next transmission date.  Dr. Royann Shivers recommends that you schedule a follow-up appointment in: 6 MONTHS WITH DEVICE CHECK (ST JUDE).          Joie Bimler, MD  06/13/2015 9:14 AM    [facility] [address] [place], [place], [place]  [address] Phone: [phone]; Fax: [phone]

## 2015-07-14 ENCOUNTER — Other Ambulatory Visit: Payer: Self-pay | Admitting: Family Medicine

## 2015-07-15 ENCOUNTER — Telehealth: Payer: Self-pay | Admitting: Cardiovascular Disease

## 2015-07-15 DIAGNOSIS — I5022 Chronic systolic (congestive) heart failure: Secondary | ICD-10-CM

## 2015-07-15 NOTE — Telephone Encounter (Signed)
[facility] - need more info about the letter he needs and who it is to be addressed to.

## 2015-07-15 NOTE — Telephone Encounter (Signed)
Pt need a letter stating that his condition have not changed and give them an update about his condition please.

## 2015-07-16 ENCOUNTER — Encounter: Payer: Medicare Other | Admitting: *Deleted

## 2015-07-16 NOTE — Telephone Encounter (Signed)
Follow Up ° °Pt returned call//  °

## 2015-07-16 NOTE — Telephone Encounter (Signed)
Patient's disability is being reassessed.  Needs a letter stating medical condition.  Reviewed with Dr. Salena Saner.  Will get an Echo to assess EF.  Patient notified he will be contacted with an appt.

## 2015-07-17 ENCOUNTER — Telehealth: Payer: Self-pay | Admitting: Pharmacist

## 2015-07-17 NOTE — Telephone Encounter (Signed)
Patient due INR - has at cardio per patient in beginning of Feb.  Appt given for 08/05/15 at 8am

## 2015-07-18 ENCOUNTER — Encounter: Payer: Self-pay | Admitting: Cardiology

## 2015-07-24 ENCOUNTER — Other Ambulatory Visit: Payer: Self-pay | Admitting: *Deleted

## 2015-07-24 MED ORDER — DIGOXIN 125 MCG PO TABS: 125.0000 ug | ORAL_TABLET | ORAL | Status: DC

## 2015-07-31 ENCOUNTER — Other Ambulatory Visit: Payer: Self-pay

## 2015-07-31 ENCOUNTER — Ambulatory Visit (HOSPITAL_COMMUNITY): Payer: Medicare Other | Attending: Cardiology

## 2015-07-31 DIAGNOSIS — I071 Rheumatic tricuspid insufficiency: Secondary | ICD-10-CM | POA: Diagnosis not present

## 2015-07-31 DIAGNOSIS — Z6841 Body Mass Index (BMI) 40.0 and over, adult: Secondary | ICD-10-CM | POA: Diagnosis not present

## 2015-07-31 DIAGNOSIS — I110 Hypertensive heart disease with heart failure: Secondary | ICD-10-CM | POA: Diagnosis not present

## 2015-07-31 DIAGNOSIS — I5022 Chronic systolic (congestive) heart failure: Secondary | ICD-10-CM

## 2015-07-31 DIAGNOSIS — I509 Heart failure, unspecified: Secondary | ICD-10-CM | POA: Diagnosis present

## 2015-07-31 DIAGNOSIS — I77810 Thoracic aortic ectasia: Secondary | ICD-10-CM | POA: Diagnosis not present

## 2015-07-31 DIAGNOSIS — I313 Pericardial effusion (noninflammatory): Secondary | ICD-10-CM | POA: Diagnosis not present

## 2015-07-31 DIAGNOSIS — I429 Cardiomyopathy, unspecified: Secondary | ICD-10-CM | POA: Diagnosis not present

## 2015-08-01 ENCOUNTER — Telehealth: Payer: Self-pay | Admitting: *Deleted

## 2015-08-01 DIAGNOSIS — I77810 Thoracic aortic ectasia: Secondary | ICD-10-CM

## 2015-08-01 NOTE — Telephone Encounter (Signed)
-----   Message from Thurmon Fair, MD sent at 07/31/2015  2:06 PM EDT ----- Echocardiogram continues to show moderately depressed left ventricular systolic function, and also shows moderate enlargement of the aortic root which we should evaluate with a CT scan(more accurate). Please order a CT angiogram of the thoracic aorta with contrast. We do have a CT scan from 2010 to compare to

## 2015-08-01 NOTE — Telephone Encounter (Signed)
Left message for pt to call.

## 2015-08-04 ENCOUNTER — Other Ambulatory Visit: Payer: Self-pay

## 2015-08-04 DIAGNOSIS — I7789 Other specified disorders of arteries and arterioles: Secondary | ICD-10-CM

## 2015-08-04 NOTE — Telephone Encounter (Signed)
Returned call to patient echo results given.Scheduler will call back to schedule CT angio.

## 2015-08-04 NOTE — Telephone Encounter (Signed)
Mr. Huffman return call about his Echo results .Marland Kitchen Please call

## 2015-08-05 ENCOUNTER — Ambulatory Visit (INDEPENDENT_AMBULATORY_CARE_PROVIDER_SITE_OTHER): Payer: Medicare Other | Admitting: Pharmacist Clinician (PhC)/ Clinical Pharmacy Specialist

## 2015-08-05 DIAGNOSIS — I480 Paroxysmal atrial fibrillation: Secondary | ICD-10-CM

## 2015-08-05 DIAGNOSIS — Z7901 Long term (current) use of anticoagulants: Secondary | ICD-10-CM

## 2015-08-05 LAB — COAGUCHEK XS/INR WAIVED
INR: 1.3 — ABNORMAL HIGH (ref 0.9–1.1)
PROTHROMBIN TIME: 15.4 s

## 2015-08-05 NOTE — Patient Instructions (Signed)
Anticoagulation Dose Instructions as of 08/05/2015      Glynis Smiles Tue Wed Thu Fri Sat   New Dose 7.5 mg 5 mg 7.5 mg 7.5 mg 7.5 mg 5 mg 7.5 mg    Description        Take 1 1/2 tablets every day of the week except for Mondays and Fridays 1 tablet  INR today is 1.3

## 2015-08-07 ENCOUNTER — Ambulatory Visit (INDEPENDENT_AMBULATORY_CARE_PROVIDER_SITE_OTHER)
Admission: RE | Admit: 2015-08-07 | Discharge: 2015-08-07 | Disposition: A | Discharge status: Home / Self Care | Payer: Medicare Other | Source: Ambulatory Visit | Attending: Cardiology | Admitting: Cardiology

## 2015-08-07 DIAGNOSIS — I77810 Thoracic aortic ectasia: Secondary | ICD-10-CM | POA: Diagnosis not present

## 2015-08-07 DIAGNOSIS — I7789 Other specified disorders of arteries and arterioles: Secondary | ICD-10-CM

## 2015-08-07 IMAGING — CT CT ANGIO CHEST
2 of 5 series · 18 of 46 positions shown · IV contrast (isovue)
Comparison: CT scan of March 19, 2009.

CLINICAL DATA: Aortic root enlargement.

EXAM:
CT ANGIOGRAPHY CHEST WITH CONTRAST
TECHNIQUE: Multidetector CT imaging of the chest was performed using the
standard protocol during bolus administration of intravenous
contrast. Multiplanar CT image reconstructions and MIPs were
obtained to evaluate the vascular anatomy.
CONTRAST:  100 mL of Isovue 370 intravenously.

[Series 4: aorta 3.0 i31f 2 · axial · 0.81mm/px · z∈[-289,-19]mm · 15 of 98 slices shown]
[im 4/98  lung]
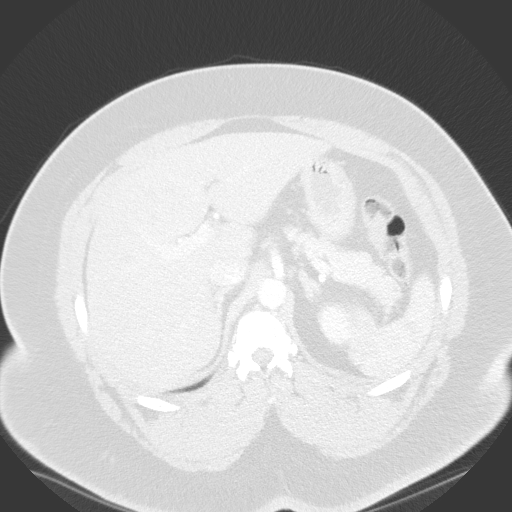
[im 12/98  soft-tissue]
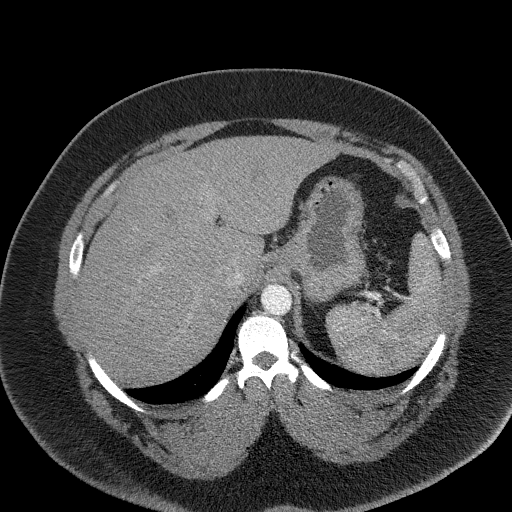
[im 19/98  lung]
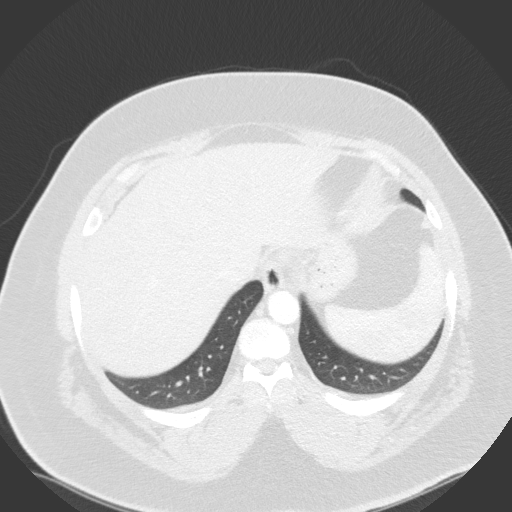
[im 23/98  soft-tissue]
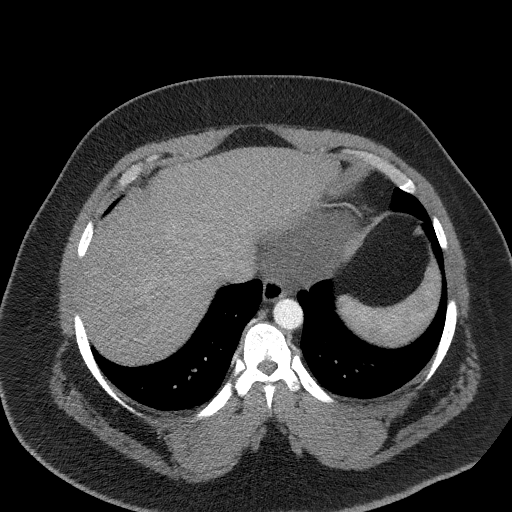
[im 30/98  lung]
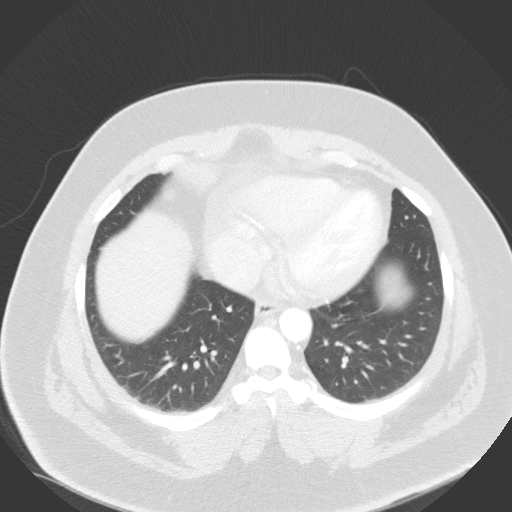
[im 38/98  soft-tissue]
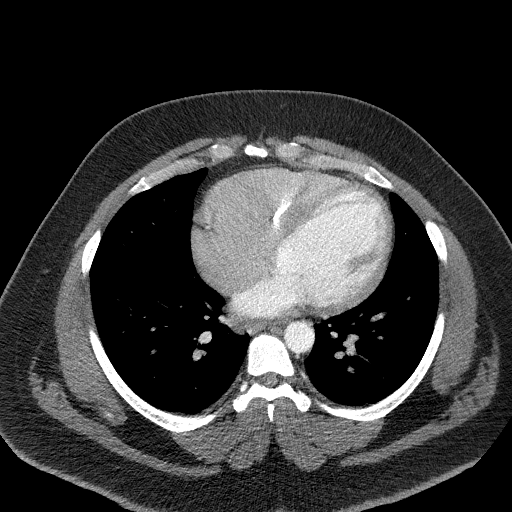
[im 42/98  lung]
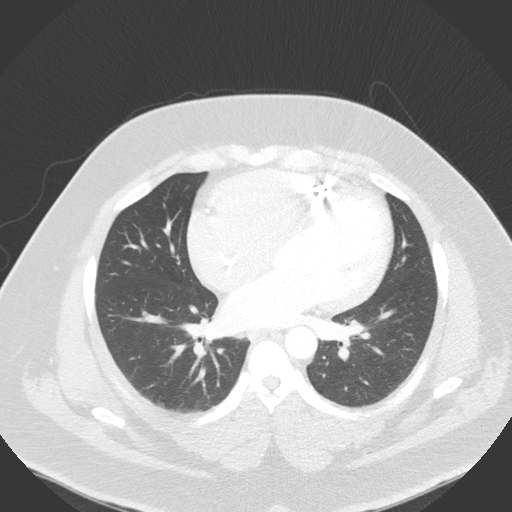
[im 49/98  soft-tissue]
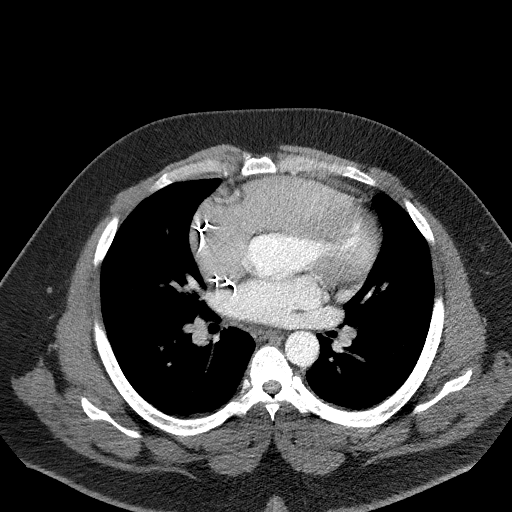
[im 56/98  lung]
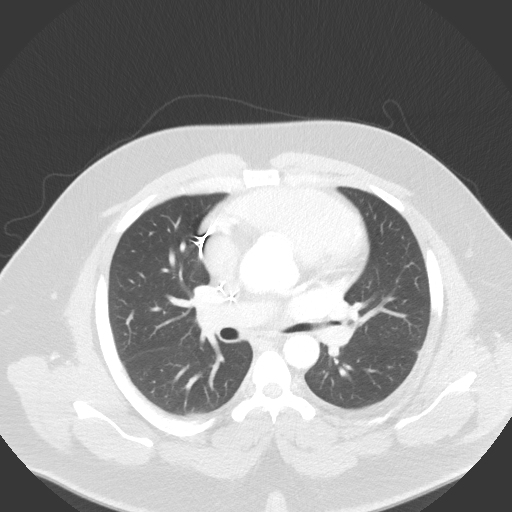
[im 60/98  soft-tissue]
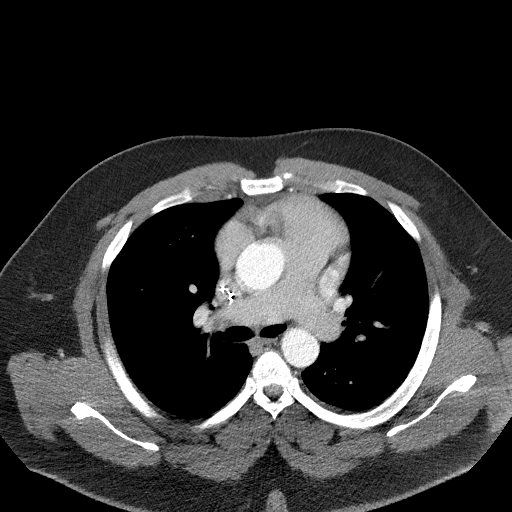
[im 68/98  lung]
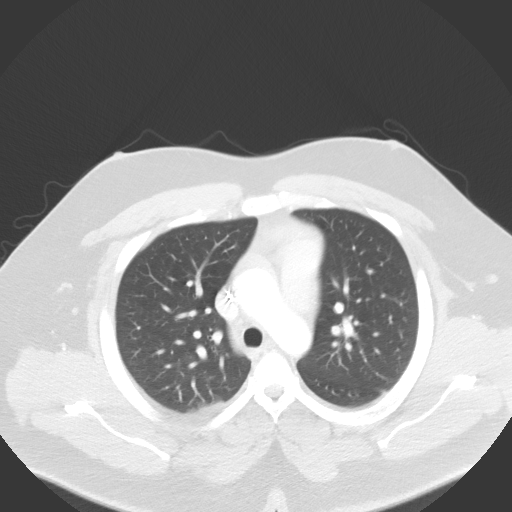
[im 75/98  soft-tissue]
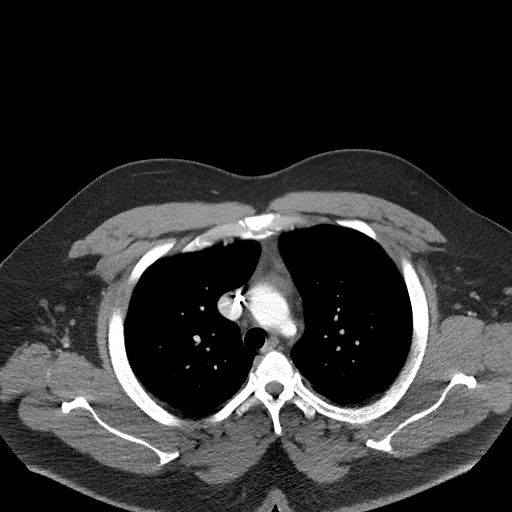
[im 79/98  lung]
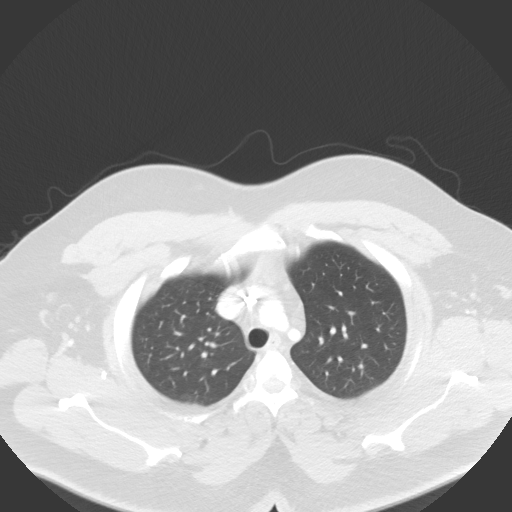
[im 86/98  soft-tissue]
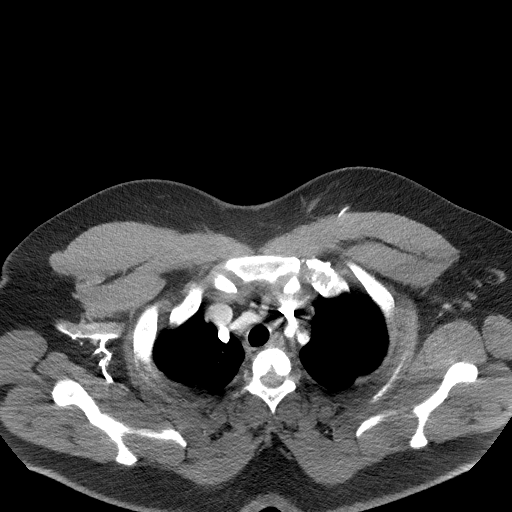
[im 94/98  lung]
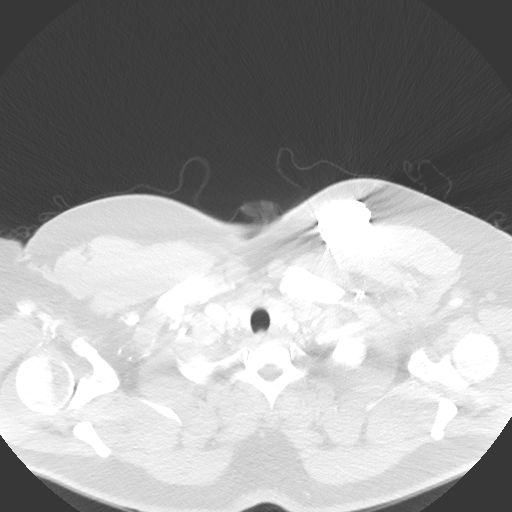

[Series 7: coronals · coronal · 0.58mm/px · 3 of 161 slices shown]
[im 54/161  soft-tissue]
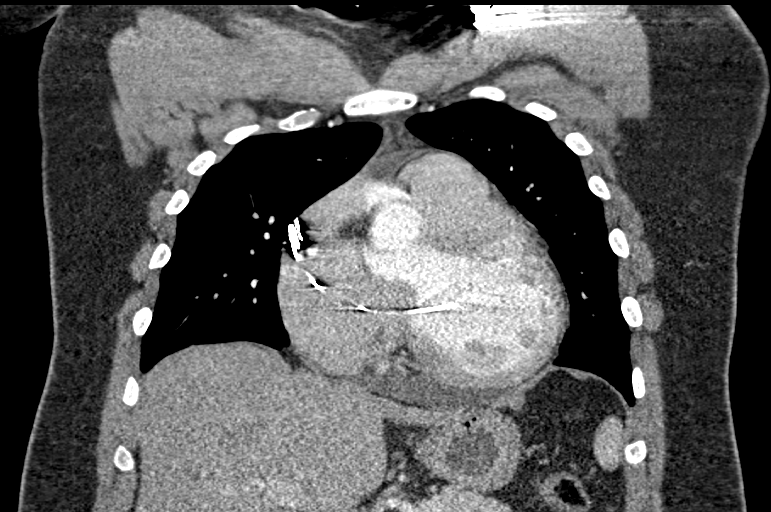
[im 72/161  soft-tissue]
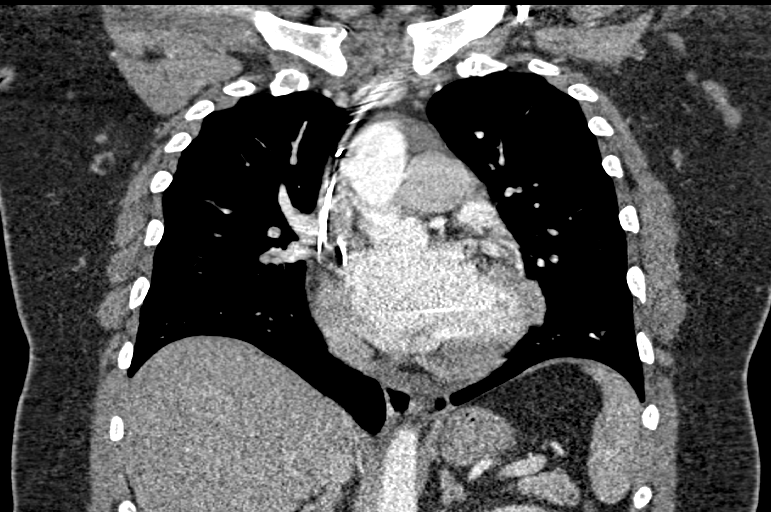
[im 89/161  soft-tissue]
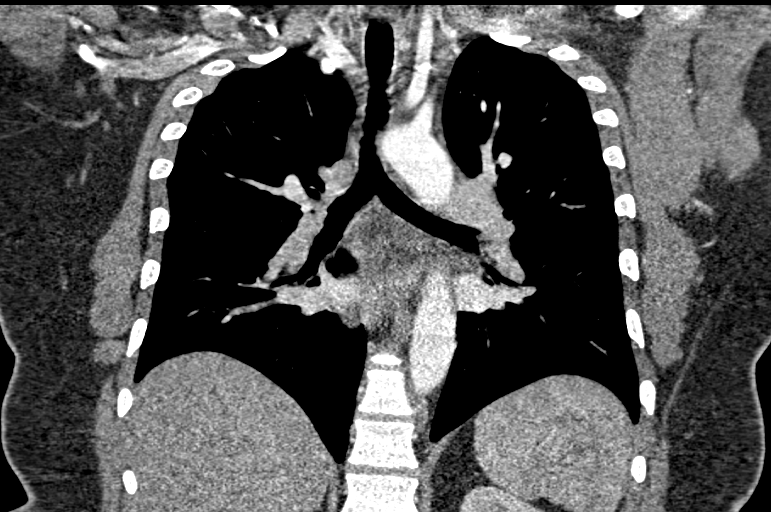

[18 of 46 positions shown; findings below may reference images not displayed]

FINDINGS: No pneumothorax or significant pleural effusion is noted. Stable
mild cardiomegaly is noted. There is no evidence of thoracic aortic
aneurysm or dissection. The aortic root is mildly dilated at 4.4 cm.
Great vessels are widely patent without significant stenosis.
Visualized portion of upper abdomen is unremarkable. Left-sided
pacemaker is noted with leads in grossly good position. No
mediastinal mass or adenopathy is noted. No significant osseous
abnormality is noted.

Review of the MIP images confirms the above findings.
IMPRESSION: Mildly dilated or ectatic aortic root is noted at 4.4 cm. There is
no definite evidence of thoracic aortic aneurysm or dissection.
Stable mild cardiomegaly. No other significant abnormality seen in
the chest.

## 2015-08-07 MED ORDER — IOPAMIDOL (ISOVUE-370) INJECTION 76%
100.0000 mL | Freq: Once | INTRAVENOUS | Status: AC | PRN
  Administered 2015-08-07: 100 mL via INTRAVENOUS

## 2015-08-07 NOTE — Progress Notes (Signed)
Thank you, will repeat in one year

## 2015-08-12 ENCOUNTER — Telehealth: Payer: Self-pay | Admitting: Cardiovascular Disease

## 2015-08-12 NOTE — Telephone Encounter (Signed)
New message      Calling to get CT results

## 2015-08-12 NOTE — Telephone Encounter (Signed)
Pt aware of results summary by Dr. Jens Som.

## 2015-08-13 NOTE — Progress Notes (Signed)
Yes, please repeat in one year

## 2015-08-15 ENCOUNTER — Ambulatory Visit (INDEPENDENT_AMBULATORY_CARE_PROVIDER_SITE_OTHER): Payer: Medicare Other | Admitting: Pharmacist

## 2015-08-15 DIAGNOSIS — Z7901 Long term (current) use of anticoagulants: Secondary | ICD-10-CM | POA: Diagnosis not present

## 2015-08-15 DIAGNOSIS — I480 Paroxysmal atrial fibrillation: Secondary | ICD-10-CM | POA: Diagnosis not present

## 2015-08-15 LAB — COAGUCHEK XS/INR WAIVED
INR: 2.9 — ABNORMAL HIGH (ref 0.9–1.1)
Prothrombin Time: 34.7 s

## 2015-08-15 NOTE — Patient Instructions (Signed)
Anticoagulation Dose Instructions as of 08/15/2015      Danny Hinton Tue Wed Thu Fri Sat   New Dose 5 mg 7.5 mg 5 mg 5 mg 5 mg 7.5 mg 5 mg    Description        Change dose to take 1 and 1/2 tablet mondays and fridays and 1 tablet all other days.     INR was 2.9 today

## 2015-09-05 ENCOUNTER — Encounter: Payer: Self-pay | Admitting: Pharmacist

## 2015-09-08 ENCOUNTER — Other Ambulatory Visit: Payer: Self-pay | Admitting: Family Medicine

## 2015-09-08 NOTE — Telephone Encounter (Signed)
Last seen 01/22/15  Dr Dettinger 

## 2015-09-27 LAB — CUP PACEART INCLINIC DEVICE CHECK
Date Time Interrogation Session: 20170520103602
Implantable Lead Implant Date: 20111010
Implantable Lead Location: 753859
MDC IDC LEAD IMPLANT DT: 20111010
MDC IDC LEAD LOCATION: 753860
Pulse Gen Serial Number: 621444

## 2015-10-07 ENCOUNTER — Ambulatory Visit (INDEPENDENT_AMBULATORY_CARE_PROVIDER_SITE_OTHER): Payer: Medicare Other | Admitting: *Deleted

## 2015-10-07 DIAGNOSIS — Z95810 Presence of automatic (implantable) cardiac defibrillator: Secondary | ICD-10-CM

## 2015-10-07 DIAGNOSIS — I428 Other cardiomyopathies: Secondary | ICD-10-CM

## 2015-10-07 DIAGNOSIS — I429 Cardiomyopathy, unspecified: Secondary | ICD-10-CM

## 2015-10-08 NOTE — Progress Notes (Signed)
Remote ICD transmission.   

## 2015-10-11 ENCOUNTER — Other Ambulatory Visit: Payer: Self-pay | Admitting: Family Medicine

## 2015-10-11 ENCOUNTER — Other Ambulatory Visit: Payer: Self-pay | Admitting: Cardiovascular Disease

## 2015-10-14 NOTE — Telephone Encounter (Signed)
Last seen 01/22/15  Dr Dettinger   Dr Ermalinda Memos PCP

## 2015-10-23 LAB — CUP PACEART REMOTE DEVICE CHECK
Brady Statistic AP VP Percent: 1 %
Brady Statistic AP VS Percent: 29 %
Brady Statistic AS VP Percent: 1 %
Brady Statistic RV Percent Paced: 1 %
HighPow Impedance: 42 Ohm
Implantable Lead Implant Date: 20111010
Implantable Lead Location: 753860
Lead Channel Pacing Threshold Amplitude: 1.25 V
Lead Channel Pacing Threshold Pulse Width: 0.5 ms
Lead Channel Sensing Intrinsic Amplitude: 11.7 mV
Lead Channel Setting Pacing Amplitude: 2.5 V
Lead Channel Setting Pacing Amplitude: 2.5 V
MDC IDC LEAD IMPLANT DT: 20111010
MDC IDC LEAD LOCATION: 753859
MDC IDC MSMT BATTERY REMAINING LONGEVITY: 42 mo
MDC IDC MSMT BATTERY REMAINING PERCENTAGE: 43 %
MDC IDC MSMT BATTERY VOLTAGE: 2.92 V
MDC IDC MSMT LEADCHNL RA IMPEDANCE VALUE: 440 Ohm
MDC IDC MSMT LEADCHNL RA PACING THRESHOLD PULSEWIDTH: 0.9 ms
MDC IDC MSMT LEADCHNL RA SENSING INTR AMPL: 3.6 mV
MDC IDC MSMT LEADCHNL RV IMPEDANCE VALUE: 450 Ohm
MDC IDC MSMT LEADCHNL RV PACING THRESHOLD AMPLITUDE: 0.75 V
MDC IDC SESS DTM: 20170529193355
MDC IDC SET LEADCHNL RV PACING PULSEWIDTH: 0.5 ms
MDC IDC SET LEADCHNL RV SENSING SENSITIVITY: 0.5 mV
MDC IDC STAT BRADY AS VS PERCENT: 71 %
MDC IDC STAT BRADY RA PERCENT PACED: 28 %
Pulse Gen Serial Number: 621444

## 2015-10-27 ENCOUNTER — Other Ambulatory Visit: Payer: Self-pay | Admitting: Cardiovascular Disease

## 2015-10-28 ENCOUNTER — Encounter: Payer: Self-pay | Admitting: Family Medicine

## 2015-10-28 ENCOUNTER — Encounter: Payer: Self-pay | Admitting: Cardiology

## 2015-10-30 ENCOUNTER — Telehealth: Payer: Self-pay | Admitting: Pharmacist

## 2015-10-30 NOTE — Telephone Encounter (Signed)
Patient is in need of protime check due to chronic anticoagulation therapy.  Left message on phone to call office to schedule appt ASAP.

## 2015-11-13 ENCOUNTER — Ambulatory Visit (INDEPENDENT_AMBULATORY_CARE_PROVIDER_SITE_OTHER): Payer: Medicare Other | Admitting: Pharmacist

## 2015-11-13 ENCOUNTER — Encounter: Payer: Self-pay | Admitting: Cardiology

## 2015-11-13 DIAGNOSIS — Z7901 Long term (current) use of anticoagulants: Secondary | ICD-10-CM | POA: Diagnosis not present

## 2015-11-13 DIAGNOSIS — I480 Paroxysmal atrial fibrillation: Secondary | ICD-10-CM

## 2015-11-13 LAB — COAGUCHEK XS/INR WAIVED
INR: 2.5 — ABNORMAL HIGH (ref 0.9–1.1)
Prothrombin Time: 30.3 s

## 2015-11-13 MED ORDER — CITALOPRAM HYDROBROMIDE 20 MG PO TABS: 20.0000 mg | ORAL_TABLET | Freq: Every day | ORAL | Status: DC

## 2015-11-13 MED ORDER — SPIRONOLACTONE 25 MG PO TABS: 25.0000 mg | ORAL_TABLET | Freq: Every day | ORAL | Status: DC

## 2015-11-13 MED ORDER — LISINOPRIL 20 MG PO TABS: 20.0000 mg | ORAL_TABLET | Freq: Two times a day (BID) | ORAL | Status: DC

## 2015-11-13 MED ORDER — WARFARIN SODIUM 5 MG PO TABS: ORAL_TABLET | ORAL | Status: DC

## 2015-11-13 MED ORDER — CARVEDILOL 25 MG PO TABS: ORAL_TABLET | ORAL | Status: DC

## 2015-11-13 MED ORDER — DIGOXIN 125 MCG PO TABS: 125.0000 ug | ORAL_TABLET | ORAL | Status: DC

## 2015-11-13 MED ORDER — ATORVASTATIN CALCIUM 40 MG PO TABS: 40.0000 mg | ORAL_TABLET | Freq: Every day | ORAL | Status: DC

## 2015-11-13 NOTE — Patient Instructions (Signed)
Anticoagulation Dose Instructions as of 11/13/2015      Danny Hinton Tue Wed Thu Fri Sat   New Dose 5 mg 7.5 mg 5 mg 5 mg 5 mg 7.5 mg 5 mg    Description        Continue warfarin 5mg  dose to take 1 and 1/2 tablet mondays and fridays and 1 tablet all other days.     INR was 2.5 today

## 2015-11-15 ENCOUNTER — Other Ambulatory Visit: Payer: Self-pay | Admitting: Family Medicine

## 2015-11-15 ENCOUNTER — Other Ambulatory Visit: Payer: Self-pay | Admitting: Cardiovascular Disease

## 2015-11-17 NOTE — Telephone Encounter (Signed)
Last seen 01/22/15  Dr Dettinger

## 2015-11-17 NOTE — Telephone Encounter (Signed)
spironolactone (ALDACTONE) 25 MG tablet  Medication   Date: 11/13/2015  Department: [facility]  Ordering/Authorizing: Elenora Gamma, MD      Order Providers    Prescribing Provider Encounter Provider   Elenora Gamma, MD Henrene Pastor, [facility]    Medication Detail      Disp Refills Start End     spironolactone (ALDACTONE) 25 MG tablet 30 tablet 0 11/13/2015     Sig - Route: Take 1 tablet (25 mg total) by mouth daily. - Oral    E-Prescribing Status: Receipt confirmed by pharmacy (11/13/2015 8:44 AM EDT)     Pharmacy    [id number] - [place], [place] - [address]

## 2015-11-24 ENCOUNTER — Telehealth: Payer: Self-pay | Admitting: Cardiology

## 2015-11-24 NOTE — Telephone Encounter (Signed)
Returning call to [name]. Transmission reviewed- no episodes since 10/18/15 (SVT). Normal device function. He felt like he might have had indigestion, he just felt "funny." He says he is feeling better now as he is getting up and moving around. He denies chest pain or pressure, device site redness/swelling/drainage, fever/chills and increased shortness of breath. He just wanted to make sure nothing was going on with his ICD. Follow up as scheduled in August. He is agreeable.

## 2015-11-24 NOTE — Telephone Encounter (Signed)
Pt called in and stated that he feels a little weird around the left arm / chest / shoulder area. He sent a remote transmission. Please review and call.

## 2015-12-05 ENCOUNTER — Telehealth: Payer: Self-pay | Admitting: Cardiology

## 2015-12-05 NOTE — Telephone Encounter (Signed)
Spoke w/ pt and requested that he send a manual transmission b/c his home monitor has not updated in at least 8 days.   

## 2015-12-12 ENCOUNTER — Ambulatory Visit (INDEPENDENT_AMBULATORY_CARE_PROVIDER_SITE_OTHER): Payer: Medicare Other | Admitting: Family Medicine

## 2015-12-12 ENCOUNTER — Encounter: Payer: Self-pay | Admitting: Family Medicine

## 2015-12-12 VITALS — BP 108/72 | HR 60 | Temp 97.2°F | Ht 78.0 in | Wt >= 6400 oz

## 2015-12-12 DIAGNOSIS — F329 Major depressive disorder, single episode, unspecified: Secondary | ICD-10-CM

## 2015-12-12 DIAGNOSIS — Z7901 Long term (current) use of anticoagulants: Secondary | ICD-10-CM

## 2015-12-12 DIAGNOSIS — I480 Paroxysmal atrial fibrillation: Secondary | ICD-10-CM | POA: Diagnosis not present

## 2015-12-12 DIAGNOSIS — F418 Other specified anxiety disorders: Secondary | ICD-10-CM | POA: Diagnosis not present

## 2015-12-12 DIAGNOSIS — F419 Anxiety disorder, unspecified: Secondary | ICD-10-CM

## 2015-12-12 DIAGNOSIS — I10 Essential (primary) hypertension: Secondary | ICD-10-CM | POA: Diagnosis not present

## 2015-12-12 DIAGNOSIS — L309 Dermatitis, unspecified: Secondary | ICD-10-CM | POA: Diagnosis not present

## 2015-12-12 DIAGNOSIS — E785 Hyperlipidemia, unspecified: Secondary | ICD-10-CM

## 2015-12-12 LAB — COAGUCHEK XS/INR WAIVED
INR: 3.3 — ABNORMAL HIGH (ref 0.9–1.1)
Prothrombin Time: 39.9 s

## 2015-12-12 MED ORDER — CARVEDILOL 25 MG PO TABS: ORAL_TABLET | ORAL | 2 refills | Status: DC

## 2015-12-12 MED ORDER — WARFARIN SODIUM 5 MG PO TABS: ORAL_TABLET | ORAL | 1 refills | Status: DC

## 2015-12-12 MED ORDER — LISINOPRIL 20 MG PO TABS: 20.0000 mg | ORAL_TABLET | Freq: Two times a day (BID) | ORAL | 2 refills | Status: DC

## 2015-12-12 MED ORDER — FLUTICASONE PROPIONATE 50 MCG/ACT NA SUSP: 1.0000 | NASAL | 11 refills | Status: DC | PRN

## 2015-12-12 MED ORDER — TRIAMCINOLONE ACETONIDE 0.1 % EX CREA: TOPICAL_CREAM | Freq: Two times a day (BID) | CUTANEOUS | 2 refills | Status: DC | PRN

## 2015-12-12 MED ORDER — DIGOXIN 125 MCG PO TABS: 125.0000 ug | ORAL_TABLET | ORAL | 2 refills | Status: DC

## 2015-12-12 MED ORDER — SPIRONOLACTONE 25 MG PO TABS: 25.0000 mg | ORAL_TABLET | Freq: Every day | ORAL | 2 refills | Status: DC

## 2015-12-12 MED ORDER — ATORVASTATIN CALCIUM 40 MG PO TABS: 40.0000 mg | ORAL_TABLET | Freq: Every day | ORAL | 2 refills | Status: DC

## 2015-12-12 MED ORDER — CITALOPRAM HYDROBROMIDE 20 MG PO TABS: 20.0000 mg | ORAL_TABLET | Freq: Every day | ORAL | 2 refills | Status: DC

## 2015-12-12 NOTE — Progress Notes (Signed)
BP 108/72   Pulse 60   Temp 97.2 F (36.2 C) (Oral)   Ht '6\' 6"'  (1.981 m)   Wt (!) 419 lb 6.4 oz (190.2 kg)   BMI 48.47 kg/m    Subjective:    Patient ID: Danny Hinton, male    DOB: 12-Jun-1980, 35 y.o.   MRN: [id number]  HPI: Danny Hinton is a 35 y.o. male presenting on 12/12/2015 for Medication Refill   HPI Hypertension recheck Patient is coming in to have a medication recheck and refill of his medications and physical. His blood pressure today is 108/72. He has a cardiologist that he has followed up with on managing his A. fib and his hypertension and his cholesterol. He denies any issues with his medication. Patient denies headaches, blurred vision, chest pains, shortness of breath, or weakness. Denies any side effects from medication and is content with current medication.   Paroxysmal A. fib Patient is coming in for recheck of his A. fib as well. He normally sees Vernona Rieger PhD to manage his Coumadin levels. He is also on medications for rate control. He denies any issues with it or any tachycardic episodes. Or palpitations.  Hyperlipidemia recheck Patient is coming in today for a cholesterol recheck. He is currently taking Lipitor 40 mg and denies any major issues with it. He denies any myalgias or liver issues that he knows of.  Anxiety and depression recheck Patient is currently on 20 mg Celexa and is doing very well on that medication. He denies any major issues with anxiety or depression like to continue medication at the current dose. He denies any issues with sleep and is generally feeling like himself he likes to do.  Relevant past medical, surgical, family and social history reviewed and updated as indicated. Interim medical history since our last visit reviewed. Allergies and medications reviewed and updated.  Review of Systems  Constitutional: Negative for chills and fever.  HENT: Negative for congestion, ear discharge and ear pain.   Eyes: Negative for discharge  and visual disturbance.  Respiratory: Negative for shortness of breath and wheezing.   Cardiovascular: Negative for chest pain, palpitations and leg swelling.  Gastrointestinal: Negative for abdominal pain, constipation and diarrhea.  Genitourinary: Negative for difficulty urinating.  Musculoskeletal: Negative for back pain and gait problem.  Skin: Negative for rash.  Neurological: Negative for syncope, light-headedness and headaches.  All other systems reviewed and are negative.   Per HPI unless specifically indicated above     Medication List       Accurate as of 12/12/15  8:53 AM. Always use your most recent med list.          atorvastatin 40 MG tablet Commonly known as:  LIPITOR Take 1 tablet (40 mg total) by mouth at bedtime.   carvedilol 25 MG tablet Commonly known as:  COREG Take 1&1/2 tablets twice a day   citalopram 20 MG tablet Commonly known as:  CELEXA Take 1 tablet (20 mg total) by mouth daily.   digoxin 0.125 MG tablet Commonly known as:  LANOXIN Take 1 tablet (125 mcg total) by mouth every other day.   fluticasone 50 MCG/ACT nasal spray Commonly known as:  FLONASE Place 1 spray into both nostrils as needed.   furosemide 40 MG tablet Commonly known as:  LASIX Take 1 tablet (40 mg total) by mouth daily as needed for fluid.   lisinopril 20 MG tablet Commonly known as:  PRINIVIL,ZESTRIL Take 1 tablet (20 mg total)  by mouth 2 (two) times daily.   spironolactone 25 MG tablet Commonly known as:  ALDACTONE Take 1 tablet (25 mg total) by mouth daily.   triamcinolone cream 0.1 % Commonly known as:  KENALOG Apply topically 2 (two) times daily as needed.   warfarin 5 MG tablet Commonly known as:  COUMADIN TAKE ONE TABLET BY MOUTH ONCE DAILY AS  DIRECTED  BY  ANTICOAGULATION  CLINIC          Objective:    BP 108/72   Pulse 60   Temp 97.2 F (36.2 C) (Oral)   Ht '6\' 6"'  (1.981 m)   Wt (!) 419 lb 6.4 oz (190.2 kg)   BMI 48.47 kg/m   Wt Readings  from Last 3 Encounters:  12/12/15 (!) 419 lb 6.4 oz (190.2 kg)  06/13/15 (!) 392 lb 8 oz (178 kg)  03/26/15 (!) 390 lb 6.4 oz (177.1 kg)    Physical Exam  Constitutional: He is oriented to person, place, and time. He appears well-developed and well-nourished. No distress.  Eyes: Conjunctivae and EOM are normal. Pupils are equal, round, and reactive to light. Right eye exhibits no discharge. No scleral icterus.  Neck: Neck supple. No thyromegaly present.  Cardiovascular: Normal rate, S1 normal, S2 normal, normal heart sounds and intact distal pulses.  An irregularly irregular rhythm present. Exam reveals no distant heart sounds and no friction rub.   No murmur heard. Pulmonary/Chest: Effort normal and breath sounds normal. No respiratory distress. He has no wheezes.  Musculoskeletal: Normal range of motion. He exhibits no edema.  Lymphadenopathy:    He has no cervical adenopathy.  Neurological: He is alert and oriented to person, place, and time. Coordination normal.  Skin: Skin is warm and dry. No rash noted. He is not diaphoretic.  Psychiatric: He has a normal mood and affect. His behavior is normal.  Nursing note and vitals reviewed.   Results for orders placed or performed in visit on 11/13/15  CoaguChek XS/INR Waived  Result Value Ref Range   INR 2.5 (H) 0.9 - 1.1   Prothrombin Time 30.3 sec      Assessment & Plan:   Problem List Items Addressed This Visit      Cardiovascular and Mediastinum   Paroxysmal atrial fibrillation (HCC)   Relevant Medications   atorvastatin (LIPITOR) 40 MG tablet   carvedilol (COREG) 25 MG tablet   digoxin (LANOXIN) 0.125 MG tablet   lisinopril (PRINIVIL,ZESTRIL) 20 MG tablet   spironolactone (ALDACTONE) 25 MG tablet   warfarin (COUMADIN) 5 MG tablet   Other Relevant Orders   CBC with Differential/Platelet   Hypertension - Primary   Relevant Medications   atorvastatin (LIPITOR) 40 MG tablet   carvedilol (COREG) 25 MG tablet   digoxin  (LANOXIN) 0.125 MG tablet   lisinopril (PRINIVIL,ZESTRIL) 20 MG tablet   spironolactone (ALDACTONE) 25 MG tablet   warfarin (COUMADIN) 5 MG tablet   Other Relevant Orders   CMP14+EGFR     Other   Long term current use of anticoagulant therapy   Relevant Medications   warfarin (COUMADIN) 5 MG tablet   Severe obesity (BMI >= 40) (HCC)   Hyperlipidemia   Relevant Medications   atorvastatin (LIPITOR) 40 MG tablet   carvedilol (COREG) 25 MG tablet   digoxin (LANOXIN) 0.125 MG tablet   lisinopril (PRINIVIL,ZESTRIL) 20 MG tablet   spironolactone (ALDACTONE) 25 MG tablet   warfarin (COUMADIN) 5 MG tablet   Other Relevant Orders   Lipid panel  Other Visit Diagnoses    Eczema       Relevant Medications   triamcinolone cream (KENALOG) 0.1 %   Anxiety and depression       Relevant Medications   citalopram (CELEXA) 20 MG tablet   Other Relevant Orders   CBC with Differential/Platelet       Follow up plan: Return in about 3 months (around 03/13/2016), or if symptoms worsen or fail to improve, for Follow-up hypertension and anxiety and depression.  Counseling provided for all of the vaccine components Orders Placed This Encounter  Procedures  . CMP14+EGFR  . Lipid panel  . CBC with Differential/Platelet    Caryl Pina, MD [facility] 12/12/2015, 8:53 AM

## 2015-12-18 ENCOUNTER — Telehealth: Payer: Self-pay | Admitting: Cardiology

## 2015-12-18 NOTE — Telephone Encounter (Signed)
Spoke w/ pt and requested that he send a manual transmission b/c his home monitor has not updated in at least 8 days.   

## 2015-12-19 ENCOUNTER — Telehealth: Payer: Self-pay | Admitting: Cardiovascular Disease

## 2015-12-19 NOTE — Telephone Encounter (Signed)
Returned patient call regarding receiving manual transmission. Pt was informed that we received manual transmission. Pt questioned the necessity of sending a transmission every week. In speaking with the patient he stated that he leaves his monitor at his mom's house d/t not having a home phone land line. I informed the patient that because the monitor was unable to update d/t its location he would have to continue sending weekly transmissions unless he was to obtain a cellular. I educated patient regarding the cellular adaptor and gave patient [employer] tech services number to request one. Pt verbalized understanding and will call with any further questions.

## 2015-12-19 NOTE — Telephone Encounter (Signed)
°  1. Has your device fired? no ° °2. Is you device beeping? no ° °3. Are you experiencing draining or swelling at device site? no ° °4. Are you calling to see if we received your device transmission? yes ° °5. Have you passed out? no ° °

## 2016-01-22 ENCOUNTER — Ambulatory Visit (INDEPENDENT_AMBULATORY_CARE_PROVIDER_SITE_OTHER): Payer: Medicare Other | Admitting: Pharmacist

## 2016-01-22 DIAGNOSIS — Z7901 Long term (current) use of anticoagulants: Secondary | ICD-10-CM | POA: Diagnosis not present

## 2016-01-22 DIAGNOSIS — I480 Paroxysmal atrial fibrillation: Secondary | ICD-10-CM | POA: Diagnosis not present

## 2016-01-22 LAB — COAGUCHEK XS/INR WAIVED
INR: 2.6 — ABNORMAL HIGH (ref 0.9–1.1)
PROTHROMBIN TIME: 31.7 s

## 2016-02-16 ENCOUNTER — Telehealth: Payer: Self-pay | Admitting: Cardiovascular Disease

## 2016-02-16 NOTE — Telephone Encounter (Signed)
Returned patient call. He notes he 'received letter from disability people stating he is able to work now' due to change in conditions. Patient notes confusion about this bc as far as he knows his last echo was unchanged from previous.   Letter states he can submit an appeal. Pt went to disability office today, but they are closed for [place] Day.  Pt will follow up with them tomorrow to see if any specific documentation needed. Notes he may need a supporting letter from Dr. Royann Shivers outlining his conditions and limitations for the appeal.  Pt aware I will route to Dr. Royann Shivers for recommendations. I asked that he also call back w any new information. Pt voiced understanding and thanks.

## 2016-02-16 NOTE — Telephone Encounter (Signed)
Yes, I'm not aware of any changes. I don't know why the new decision was taken

## 2016-02-16 NOTE — Telephone Encounter (Signed)
Please call,he says his disability people says are saying that he is able to work now.Pt says that they says he  no longer qualifies for disability and Medicaid.

## 2016-02-17 NOTE — Telephone Encounter (Signed)
[name] is calling because he appealed the decision by the disability commission and they maybe asking medical records . Please call if you have any questions   Thanks

## 2016-02-17 NOTE — Telephone Encounter (Signed)
Acknowledged.   I'm going to route this to [place] so that she can keep a track on this case.

## 2016-03-04 ENCOUNTER — Encounter: Payer: Self-pay | Admitting: Pharmacist

## 2016-03-11 ENCOUNTER — Ambulatory Visit (INDEPENDENT_AMBULATORY_CARE_PROVIDER_SITE_OTHER): Payer: Medicare Other | Admitting: Pharmacist

## 2016-03-11 DIAGNOSIS — Z7901 Long term (current) use of anticoagulants: Secondary | ICD-10-CM | POA: Diagnosis not present

## 2016-03-11 DIAGNOSIS — I480 Paroxysmal atrial fibrillation: Secondary | ICD-10-CM | POA: Diagnosis not present

## 2016-03-11 DIAGNOSIS — Z23 Encounter for immunization: Secondary | ICD-10-CM

## 2016-03-11 LAB — COAGUCHEK XS/INR WAIVED
INR: 1.8 — ABNORMAL HIGH (ref 0.9–1.1)
PROTHROMBIN TIME: 22.1 s

## 2016-03-31 ENCOUNTER — Other Ambulatory Visit: Payer: Self-pay | Admitting: Family Medicine

## 2016-03-31 NOTE — Telephone Encounter (Signed)
ST, congestion, cough, drainage, sinus pressure, no fever.  Using flonase  Pt advised to try plain Mucinex (no D) sailine nasal spray And zyrtec (no D)  He will call us back in a few days if no better.

## 2016-04-12 ENCOUNTER — Ambulatory Visit (INDEPENDENT_AMBULATORY_CARE_PROVIDER_SITE_OTHER): Payer: Medicare Other | Admitting: Family Medicine

## 2016-04-12 ENCOUNTER — Encounter: Payer: Self-pay | Admitting: Family Medicine

## 2016-04-12 VITALS — BP 101/64 | HR 70 | Temp 97.9°F | Ht 78.0 in | Wt >= 6400 oz

## 2016-04-12 DIAGNOSIS — J0100 Acute maxillary sinusitis, unspecified: Secondary | ICD-10-CM | POA: Diagnosis not present

## 2016-04-12 DIAGNOSIS — Z7901 Long term (current) use of anticoagulants: Secondary | ICD-10-CM | POA: Diagnosis not present

## 2016-04-12 DIAGNOSIS — I480 Paroxysmal atrial fibrillation: Secondary | ICD-10-CM

## 2016-04-12 MED ORDER — PSEUDOEPHEDRINE-GUAIFENESIN ER 120-1200 MG PO TB12: 1.0000 | ORAL_TABLET | Freq: Two times a day (BID) | ORAL | 0 refills | Status: DC

## 2016-04-12 MED ORDER — AMOXICILLIN-POT CLAVULANATE 875-125 MG PO TABS: 1.0000 | ORAL_TABLET | Freq: Two times a day (BID) | ORAL | 0 refills | Status: DC

## 2016-04-12 NOTE — Patient Instructions (Signed)
Pickup your antibiotic and start taking it right away. Take it twice daily with food.  I sent in a prescription for Mucinex D also. You can break them in half to get the lower dose or if you can just by the lower dose separately. All I ask is that you monitor your heart rate. It should not exceed 110. If it does U will need to discontinue that medication.  If you get a survey from [employer] please complete that to give [place] your feedback about today's visit.  Merry Christmas, Broadus John Carolie Mcilrath M.D.

## 2016-04-12 NOTE — Progress Notes (Signed)
Subjective:  Patient ID: Danny Hinton, male    DOB: 02/27/1981  Age: 35 y.o. MRN: [id number]  CC: Sinusitis (week - 2 weeks ago, sore mouth, cough, OTC mucinex, claritan & flonase these sxs better, now dry cough when out side looses up makes him gage, and is keeping headache, sweating)   HPI Danny Hinton presents for Symptoms include congestion, facial pain, nasal congestion, no  fever, non productive cough, post nasal drip and sinus pressure with no fever, chills, night sweats or weight loss. Onset of symptoms was 2 weeks ago, gradually worsening since that time. Not respondind to meds noted above. Seems to be brought on by raking leaves.   History Danny has a past medical history of Allergy; Anxiety; Atypical atrial flutter (HCC) (08/19/14); Automatic implantable cardioverter-defibrillator in situ; Chronic anticoagulation; Chronic systolic congestive heart failure (HCC); Eczema; Eczema; Hypertension; Mobitz type 2 second degree atrioventricular block; Morbid obesity (HCC); Nonischemic cardiomyopathy (HCC); PAF (paroxysmal atrial fibrillation) (HCC); and Paroxysmal atrial flutter (HCC).   He has a past surgical history that includes Cardiac defibrillator placement (02/17/10); Tooth Extraction (N/A, 10/23/2012); Cardioversion (N/A, 12/29/2013); and Atrial flutter ablation (N/A, 01/18/2014).   His family history includes Cancer (age of onset: 39) in his brother; Diabetes in his paternal grandmother; Heart disease in his maternal grandfather and mother.He reports that he has never smoked. He has never used smokeless tobacco. He reports that he does not drink alcohol or use drugs.    ROS Review of Systems  Constitutional: Negative for activity change, appetite change, chills and fever.  HENT: Positive for congestion, postnasal drip, rhinorrhea and sinus pressure. Negative for ear discharge, ear pain, hearing loss, nosebleeds, sneezing and trouble swallowing.   Respiratory: Negative for chest tightness  and shortness of breath.   Cardiovascular: Negative for chest pain and palpitations.  Skin: Negative for rash.    Objective:  BP 101/64   Pulse 70   Temp 97.9 F (36.6 C) (Oral)   Ht 6\' 6"  (1.981 m)   Wt (!) 421 lb (191 kg)   BMI 48.65 kg/m   BP Readings from Last 3 Encounters:  04/12/16 101/64  12/12/15 108/72  06/13/15 94/61    Wt Readings from Last 3 Encounters:  04/12/16 (!) 421 lb (191 kg)  12/12/15 (!) 419 lb 6.4 oz (190.2 kg)  06/13/15 (!) 392 lb 8 oz (178 kg)     Physical Exam  Constitutional: He appears well-developed and well-nourished.  HENT:  Head: Normocephalic and atraumatic.  Right Ear: Tympanic membrane and external ear normal. No decreased hearing is noted.  Left Ear: Tympanic membrane and external ear normal. No decreased hearing is noted.  Nose: Mucosal edema present. Right sinus exhibits no frontal sinus tenderness. Left sinus exhibits no frontal sinus tenderness.  Mouth/Throat: No oropharyngeal exudate or posterior oropharyngeal erythema.  Neck: No Brudzinski's sign noted.  Pulmonary/Chest: Breath sounds normal. No respiratory distress.  Lymphadenopathy:       Head (right side): No preauricular adenopathy present.       Head (left side): No preauricular adenopathy present.       Right cervical: No superficial cervical adenopathy present.      Left cervical: No superficial cervical adenopathy present.     Lab Results  Component Value Date   WBC 3.8 01/22/2015   HGB 12.9 (L) 01/11/2014   HCT 37.5 01/22/2015   PLT 277 01/22/2015   GLUCOSE 86 01/22/2015   CHOL 87 (L) 03/26/2015   TRIG 57 03/26/2015  HDL 25 (L) 03/26/2015   LDLDIRECT 52 03/26/2015   LDLCALC 51 03/26/2015   ALT 39 04/23/2015   AST 18 04/23/2015   NA 133 (L) 01/22/2015   K 4.6 01/22/2015   CL 96 (L) 01/22/2015   CREATININE 0.87 01/22/2015   BUN 10 01/22/2015   CO2 23 01/22/2015   TSH 1.670 01/22/2015   INR 1.8 (H) 03/11/2016   HGBA1C  03/19/2009    5.9 (NOTE) The  ADA recommends the following therapeutic goal for glycemic control related to Hgb A1c measurement: Goal of therapy: <6.5 Hgb A1c  Reference: American Diabetes Association: Clinical Practice Recommendations 2010, Diabetes Care, 2010, 33: (Suppl  1).    Ct Angio Chest Aorta W/cm &/or Wo/cm  Result Date: 08/07/2015 CLINICAL DATA:  Aortic root enlargement. EXAM: CT ANGIOGRAPHY CHEST WITH CONTRAST TECHNIQUE: Multidetector CT imaging of the chest was performed using the standard protocol during bolus administration of intravenous contrast. Multiplanar CT image reconstructions and MIPs were obtained to evaluate the vascular anatomy. CONTRAST:  100 mL of Isovue 370 intravenously. COMPARISON:  CT scan of March 19, 2009. FINDINGS: No pneumothorax or significant pleural effusion is noted. Stable mild cardiomegaly is noted. There is no evidence of thoracic aortic aneurysm or dissection. The aortic root is mildly dilated at 4.4 cm. Great vessels are widely patent without significant stenosis. Visualized portion of upper abdomen is unremarkable. Left-sided pacemaker is noted with leads in grossly good position. No mediastinal mass or adenopathy is noted. No significant osseous abnormality is noted. Review of the MIP images confirms the above findings. IMPRESSION: Mildly dilated or ectatic aortic root is noted at 4.4 cm. There is no definite evidence of thoracic aortic aneurysm or dissection. Stable mild cardiomegaly. No other significant abnormality seen in the chest. Electronically Signed   By: Lupita RaiderJames  Green Jr, M.D.   On: 08/07/2015 09:46    Assessment & Plan:   Danny Hinton was seen today for sinusitis.  Diagnoses and all orders for this visit:  Acute maxillary sinusitis, recurrence not specified  Paroxysmal atrial fibrillation (HCC)  Long term current use of anticoagulant therapy  Other orders -     amoxicillin-clavulanate (AUGMENTIN) 875-125 MG tablet; Take 1 tablet by mouth 2 (two) times daily. Take all of  this medication -     Pseudoephedrine-Guaifenesin [phone] MG TB12; Take 1 tablet by mouth 2 (two) times daily. For congestion     weeks  I am having Danny Hinton start on amoxicillin-clavulanate and Pseudoephedrine-Guaifenesin. I am also having him maintain his furosemide, atorvastatin, carvedilol, citalopram, digoxin, fluticasone, lisinopril, spironolactone, triamcinolone cream, and warfarin.  Meds ordered this encounter  Medications  . amoxicillin-clavulanate (AUGMENTIN) 875-125 MG tablet    Sig: Take 1 tablet by mouth 2 (two) times daily. Take all of this medication    Dispense:  20 tablet    Refill:  0  . Pseudoephedrine-Guaifenesin [phone] MG TB12    Sig: Take 1 tablet by mouth 2 (two) times daily. For congestion    Dispense:  20 each    Refill:  0     Follow-up: Return if symptoms worsen or fail to improve.  Mechele ClaudeWarren Anairis Knick, M.D.

## 2016-04-19 ENCOUNTER — Encounter: Payer: Self-pay | Admitting: Pharmacist

## 2016-04-20 ENCOUNTER — Encounter: Payer: Self-pay | Admitting: Family Medicine

## 2016-04-20 ENCOUNTER — Telehealth: Payer: Self-pay | Admitting: Family Medicine

## 2016-04-21 ENCOUNTER — Other Ambulatory Visit: Payer: Self-pay | Admitting: Family Medicine

## 2016-04-21 MED ORDER — AMOXICILLIN-POT CLAVULANATE 875-125 MG PO TABS: 1.0000 | ORAL_TABLET | Freq: Two times a day (BID) | ORAL | 0 refills | Status: DC

## 2016-04-21 NOTE — Telephone Encounter (Signed)
Please contact the patient I would like for him to take another course of the Augmentin. Sometimes sinusitis takes longer to respond. That can be very atypical. Regarding the Mucinex D, if the medicine has not caused any palpitations or rapid heart rate by this time, he should be okay to continue it. Just be on the lookout for those symptoms and discontinue the medicine if they occur

## 2016-04-21 NOTE — Telephone Encounter (Signed)
Patient aware and verbalizes understanding. 

## 2016-04-21 NOTE — Telephone Encounter (Signed)
lmtcb

## 2016-04-21 NOTE — Telephone Encounter (Signed)
Patient was seen 04/12/16 by [facility] and was put on Augmentin and otc mucinex d. Patient states he has one more day on antibodic and has finished the mucinex. Patient is still having a cough and nasal congestion. It has improved some but is not gone all the way. Patient would like to know what he should do since he was told not to be careful taking mucinex due to his heart. Please advise and send back to the pools.

## 2016-05-04 ENCOUNTER — Ambulatory Visit (INDEPENDENT_AMBULATORY_CARE_PROVIDER_SITE_OTHER): Payer: Medicare Other | Admitting: *Deleted

## 2016-05-04 DIAGNOSIS — I428 Other cardiomyopathies: Secondary | ICD-10-CM

## 2016-05-05 ENCOUNTER — Other Ambulatory Visit: Payer: Self-pay | Admitting: Cardiovascular Disease

## 2016-05-05 NOTE — Progress Notes (Signed)
Remote ICD transmission.   

## 2016-05-06 LAB — CUP PACEART REMOTE DEVICE CHECK
Battery Remaining Longevity: 37 mo
Brady Statistic AP VS Percent: 30 %
Brady Statistic AS VP Percent: 1 %
HighPow Impedance: 39 Ohm
Implantable Lead Implant Date: 20111010
Lead Channel Impedance Value: 430 Ohm
Lead Channel Pacing Threshold Amplitude: 1.25 V
Lead Channel Pacing Threshold Pulse Width: 0.5 ms
Lead Channel Sensing Intrinsic Amplitude: 3.4 mV
Lead Channel Setting Pacing Amplitude: 2.5 V
Lead Channel Setting Sensing Sensitivity: 0.5 mV
MDC IDC LEAD IMPLANT DT: 20111010
MDC IDC LEAD LOCATION: 753859
MDC IDC LEAD LOCATION: 753860
MDC IDC MSMT BATTERY REMAINING PERCENTAGE: 38 %
MDC IDC MSMT BATTERY VOLTAGE: 2.9 V
MDC IDC MSMT LEADCHNL RA PACING THRESHOLD PULSEWIDTH: 0.9 ms
MDC IDC MSMT LEADCHNL RV IMPEDANCE VALUE: 480 Ohm
MDC IDC MSMT LEADCHNL RV PACING THRESHOLD AMPLITUDE: 0.75 V
MDC IDC MSMT LEADCHNL RV SENSING INTR AMPL: 11.7 mV
MDC IDC PG IMPLANT DT: 20111010
MDC IDC PG SERIAL: 621444
MDC IDC SESS DTM: 20171225140706
MDC IDC SET LEADCHNL RA PACING AMPLITUDE: 2.5 V
MDC IDC SET LEADCHNL RV PACING PULSEWIDTH: 0.5 ms
MDC IDC STAT BRADY AP VP PERCENT: 1 %
MDC IDC STAT BRADY AS VS PERCENT: 69 %
MDC IDC STAT BRADY RA PERCENT PACED: 29 %
MDC IDC STAT BRADY RV PERCENT PACED: 1 %

## 2016-05-07 ENCOUNTER — Encounter: Payer: Self-pay | Admitting: Cardiology

## 2016-05-11 ENCOUNTER — Telehealth: Payer: Self-pay | Admitting: Pharmacist

## 2016-05-11 NOTE — Telephone Encounter (Signed)
Called patient to remind that INR recheck is past due.  Appt made fro 05/13/16 at 8:15am

## 2016-05-13 ENCOUNTER — Encounter: Payer: Self-pay | Admitting: Pharmacist

## 2016-05-14 ENCOUNTER — Ambulatory Visit (INDEPENDENT_AMBULATORY_CARE_PROVIDER_SITE_OTHER): Payer: Medicare Other | Admitting: Pharmacist Clinician (PhC)/ Clinical Pharmacy Specialist

## 2016-05-14 DIAGNOSIS — Z7901 Long term (current) use of anticoagulants: Secondary | ICD-10-CM

## 2016-05-14 DIAGNOSIS — I480 Paroxysmal atrial fibrillation: Secondary | ICD-10-CM | POA: Diagnosis not present

## 2016-05-14 LAB — COAGUCHEK XS/INR WAIVED
INR: 2.2 — ABNORMAL HIGH (ref 0.9–1.1)
Prothrombin Time: 26.7 s

## 2016-05-14 NOTE — Patient Instructions (Signed)
Anticoagulation Dose Instructions as of 05/14/2016      Danny Hinton Tue Wed Thu Fri Sat   New Dose 5 mg 5 mg 5 mg 5 mg 5 mg 7.5 mg 5 mg    Description   Take 1 tablet every day of the week except for Fridays 1 1/2 tablets

## 2016-05-21 ENCOUNTER — Encounter: Payer: Self-pay | Admitting: Cardiology

## 2016-05-23 ENCOUNTER — Other Ambulatory Visit: Payer: Self-pay | Admitting: Family Medicine

## 2016-07-09 ENCOUNTER — Telehealth: Payer: Self-pay | Admitting: *Deleted

## 2016-07-09 ENCOUNTER — Ambulatory Visit (INDEPENDENT_AMBULATORY_CARE_PROVIDER_SITE_OTHER): Payer: Medicare Other | Admitting: Pharmacist

## 2016-07-09 DIAGNOSIS — Z7901 Long term (current) use of anticoagulants: Secondary | ICD-10-CM

## 2016-07-09 DIAGNOSIS — I480 Paroxysmal atrial fibrillation: Secondary | ICD-10-CM

## 2016-07-09 LAB — COAGUCHEK XS/INR WAIVED
INR: 1.9 — ABNORMAL HIGH (ref 0.9–1.1)
PROTHROMBIN TIME: 23.2 s

## 2016-07-09 MED ORDER — WARFARIN SODIUM 5 MG PO TABS: ORAL_TABLET | ORAL | 2 refills | Status: DC

## 2016-07-09 NOTE — Telephone Encounter (Signed)
Patient seen by pharm D and requested refill on warfarin.

## 2016-07-09 NOTE — Patient Instructions (Signed)
Anticoagulation Dose Instructions as of 07/09/2016      Glynis Smiles Tue Wed Thu Fri Sat   New Dose 5 mg 5 mg 5 mg 5 mg 5 mg 7.5 mg 5 mg    Description   Take 2 full tablets today. Then continue to take 1 tablet every day of the week except for Fridays 1 1/2 tablets  INR today 1.9 (goal 2-3)

## 2016-07-12 ENCOUNTER — Telehealth: Payer: Self-pay | Admitting: Family Medicine

## 2016-07-12 NOTE — Telephone Encounter (Signed)
Have him take an allergy pill and use Flonase and Mucinex, if still not improving or worsens then have him come back in.

## 2016-07-12 NOTE — Telephone Encounter (Signed)
Spoke with the pt - he will try these things and let us know

## 2016-07-12 NOTE — Telephone Encounter (Signed)
Patient seen [facility] 12/4 and is requesting a refill on his Augmentin. Patient states that he got better but a week and a half ago he started having cough and congestion. Please advise.

## 2016-07-13 ENCOUNTER — Telehealth: Payer: Self-pay | Admitting: Cardiovascular Disease

## 2016-07-13 NOTE — Telephone Encounter (Signed)
Danny Hinton is wanting to know if Dr.Croitoru would write a letter stating that his heart condition has not changed. Disability is trying to take his benefits away and he is needing this letter before his hearing on 08/30/16 . Please call if you have any questions .   Thanks

## 2016-07-13 NOTE — Telephone Encounter (Signed)
Patient requesting letter from physician affirming medical status, in advance of a benefits hearing. Please advise. Let me know if patient needs to be seen in office.

## 2016-07-15 NOTE — Telephone Encounter (Signed)
Dr. Salena Saner is out of the office this week, can address when he returns next week.  Dr. Rennis Golden

## 2016-07-16 ENCOUNTER — Telehealth: Payer: Self-pay | Admitting: Family Medicine

## 2016-07-16 NOTE — Telephone Encounter (Signed)
Informed pt we do not carry many samples anymore. Gave him Sonja Gunn's name in [place]

## 2016-07-16 NOTE — Telephone Encounter (Signed)
Follow Up  Pt calling to check the status on his disability paperwork? No one called him to update him on status

## 2016-07-20 NOTE — Telephone Encounter (Signed)
Sorry, first I hear of this. Do you have a form to fill out or do we just type a letter? MCr

## 2016-07-21 ENCOUNTER — Encounter: Payer: Self-pay | Admitting: Cardiovascular Disease

## 2016-07-21 NOTE — Telephone Encounter (Signed)
Notified patient. He will come to office to pick up letter.  Letter placed at the front desk for pick up.

## 2016-08-03 ENCOUNTER — Encounter: Payer: Medicare Other | Admitting: *Deleted

## 2016-08-03 ENCOUNTER — Telehealth: Payer: Self-pay | Admitting: Cardiology

## 2016-08-03 NOTE — Telephone Encounter (Signed)
Spoke with pt and reminded pt of remote transmission that is due today. Pt verbalized understanding.   

## 2016-08-06 ENCOUNTER — Encounter: Payer: Self-pay | Admitting: Cardiology

## 2016-08-09 ENCOUNTER — Ambulatory Visit (INDEPENDENT_AMBULATORY_CARE_PROVIDER_SITE_OTHER): Payer: Medicare Other | Admitting: *Deleted

## 2016-08-09 DIAGNOSIS — I428 Other cardiomyopathies: Secondary | ICD-10-CM

## 2016-08-09 NOTE — Progress Notes (Signed)
Remote ICD transmission.   

## 2016-08-10 ENCOUNTER — Encounter: Payer: Self-pay | Admitting: Cardiology

## 2016-08-11 LAB — CUP PACEART REMOTE DEVICE CHECK
Brady Statistic AP VP Percent: 1 %
Brady Statistic AP VS Percent: 31 %
Brady Statistic AS VP Percent: 1 %
Brady Statistic AS VS Percent: 69 %
Date Time Interrogation Session: 20180401200142
HIGH POWER IMPEDANCE MEASURED VALUE: 42 Ohm
Implantable Lead Implant Date: 20111010
Implantable Lead Location: 753859
Lead Channel Impedance Value: 480 Ohm
Lead Channel Pacing Threshold Amplitude: 0.75 V
Lead Channel Pacing Threshold Amplitude: 1.25 V
Lead Channel Pacing Threshold Pulse Width: 0.5 ms
Lead Channel Pacing Threshold Pulse Width: 0.9 ms
Lead Channel Sensing Intrinsic Amplitude: 3.8 mV
Lead Channel Setting Pacing Pulse Width: 0.5 ms
Lead Channel Setting Sensing Sensitivity: 0.5 mV
MDC IDC LEAD IMPLANT DT: 20111010
MDC IDC LEAD LOCATION: 753860
MDC IDC MSMT BATTERY REMAINING LONGEVITY: 35 mo
MDC IDC MSMT BATTERY REMAINING PERCENTAGE: 36 %
MDC IDC MSMT BATTERY VOLTAGE: 2.89 V
MDC IDC MSMT LEADCHNL RV IMPEDANCE VALUE: 410 Ohm
MDC IDC MSMT LEADCHNL RV SENSING INTR AMPL: 11.7 mV
MDC IDC PG IMPLANT DT: 20111010
MDC IDC SET LEADCHNL RA PACING AMPLITUDE: 2.5 V
MDC IDC SET LEADCHNL RV PACING AMPLITUDE: 2.5 V
MDC IDC STAT BRADY RA PERCENT PACED: 29 %
MDC IDC STAT BRADY RV PERCENT PACED: 1 %
Pulse Gen Serial Number: 621444

## 2016-08-16 ENCOUNTER — Telehealth: Payer: Self-pay | Admitting: *Deleted

## 2016-08-16 NOTE — Telephone Encounter (Signed)
Received a phone call from the patient. He stated that there has been a mess up with his insurance and he no longer has any. He is almost out of his lisinopril, warfarin, and digoxin. He has been out of citalopram for several weeks now. He wanted to know if we had samples of these medications which we do not. Please advise. Thank you.

## 2016-08-16 NOTE — Telephone Encounter (Signed)
We don't get samples for any of his current medication.    [facility]  [facility] medication [facility] for [facility] includes:   Citalopram carvedilol lisinopril Warfarin Furosemide Spironolactone

## 2016-08-16 NOTE — Telephone Encounter (Signed)
Returned the phone call to the patient to inform him that the medications he needed were on the [facility] $[facility] list. He stated that he would call [facility].

## 2016-08-24 ENCOUNTER — Encounter: Payer: Self-pay | Admitting: Cardiology

## 2016-08-27 ENCOUNTER — Encounter: Payer: Self-pay | Admitting: Pharmacist Clinician (PhC)/ Clinical Pharmacy Specialist

## 2016-09-07 ENCOUNTER — Telehealth: Payer: Self-pay | Admitting: Pharmacist

## 2016-09-07 NOTE — Telephone Encounter (Signed)
Left message on patient's VM to call office for appointment - INR recheck is past due.

## 2016-09-24 ENCOUNTER — Telehealth: Payer: Self-pay | Admitting: Pharmacist

## 2016-09-24 NOTE — Telephone Encounter (Signed)
No lNo longer has Medicare, currently has no insurance. This occured early March. They are currently trying to re-enrole him, but it is taking a while.onger has Medicare, currently has no insurance. This occured early March. They are currently trying to re-enrole him, but it is taking a while.

## 2016-09-27 ENCOUNTER — Other Ambulatory Visit: Payer: Self-pay | Admitting: Family Medicine

## 2016-09-27 DIAGNOSIS — I10 Essential (primary) hypertension: Secondary | ICD-10-CM

## 2016-09-27 NOTE — Telephone Encounter (Signed)
Authorize 30 days only. Then contact the patient letting them know that they will need an appointment before any further prescriptions can be sent in. 

## 2016-10-06 ENCOUNTER — Other Ambulatory Visit: Payer: Self-pay | Admitting: Family Medicine

## 2016-10-06 DIAGNOSIS — I10 Essential (primary) hypertension: Secondary | ICD-10-CM

## 2016-10-30 ENCOUNTER — Other Ambulatory Visit: Payer: Self-pay | Admitting: Family Medicine

## 2016-10-30 DIAGNOSIS — Z7901 Long term (current) use of anticoagulants: Secondary | ICD-10-CM

## 2016-10-30 DIAGNOSIS — I480 Paroxysmal atrial fibrillation: Secondary | ICD-10-CM

## 2016-11-01 NOTE — Telephone Encounter (Signed)
Last seen 04/12/16  Dr Darlyn Read  Dr Dettinger PCP

## 2016-11-08 ENCOUNTER — Telehealth: Payer: Self-pay | Admitting: Cardiology

## 2016-11-08 ENCOUNTER — Encounter: Payer: Medicaid Other | Admitting: *Deleted

## 2016-11-08 NOTE — Telephone Encounter (Signed)
LMOVM reminding pt to send remote transmission.   

## 2016-11-12 ENCOUNTER — Encounter: Payer: Self-pay | Admitting: Cardiology

## 2016-11-17 ENCOUNTER — Other Ambulatory Visit: Payer: Self-pay | Admitting: Family Medicine

## 2016-11-17 DIAGNOSIS — I480 Paroxysmal atrial fibrillation: Secondary | ICD-10-CM

## 2016-11-17 DIAGNOSIS — Z7901 Long term (current) use of anticoagulants: Secondary | ICD-10-CM

## 2016-11-29 ENCOUNTER — Encounter: Payer: Medicare Other | Admitting: Pharmacist Clinician (PhC)/ Clinical Pharmacy Specialist

## 2016-11-30 ENCOUNTER — Ambulatory Visit (INDEPENDENT_AMBULATORY_CARE_PROVIDER_SITE_OTHER): Payer: Medicare Other | Admitting: *Deleted

## 2016-11-30 ENCOUNTER — Encounter: Payer: Self-pay | Admitting: Family Medicine

## 2016-11-30 DIAGNOSIS — I428 Other cardiomyopathies: Secondary | ICD-10-CM

## 2016-12-01 NOTE — Progress Notes (Signed)
Remote ICD transmission.   

## 2016-12-02 ENCOUNTER — Encounter: Payer: Self-pay | Admitting: Cardiology

## 2016-12-06 ENCOUNTER — Ambulatory Visit (INDEPENDENT_AMBULATORY_CARE_PROVIDER_SITE_OTHER): Payer: Medicare Other | Admitting: Pharmacist

## 2016-12-06 DIAGNOSIS — Z7901 Long term (current) use of anticoagulants: Secondary | ICD-10-CM

## 2016-12-06 DIAGNOSIS — I480 Paroxysmal atrial fibrillation: Secondary | ICD-10-CM

## 2016-12-06 LAB — COAGUCHEK XS/INR WAIVED
INR: 2.9 — AB (ref 0.9–1.1)
Prothrombin Time: 34.6 s

## 2016-12-06 MED ORDER — WARFARIN SODIUM 5 MG PO TABS: ORAL_TABLET | ORAL | 0 refills | Status: DC

## 2016-12-06 NOTE — Patient Instructions (Signed)
Anticoagulation Warfarin Dose Instructions as of 12/06/2016      Danny Hinton Tue Wed Thu Fri Sat   New Dose 5 mg 5 mg 5 mg 5 mg 5 mg 7.5 mg 5 mg    Description   Continue current warfarin 5mg  dose -  take 1 tablet every day of the week except for Fridays 1  And 1/2 tablets  INR today 2.9 (goal 2-3)

## 2016-12-10 ENCOUNTER — Other Ambulatory Visit: Payer: Self-pay | Admitting: Family Medicine

## 2016-12-10 ENCOUNTER — Encounter: Payer: Medicare Other | Admitting: Pharmacist Clinician (PhC)/ Clinical Pharmacy Specialist

## 2016-12-10 DIAGNOSIS — Z7901 Long term (current) use of anticoagulants: Secondary | ICD-10-CM

## 2016-12-10 DIAGNOSIS — I480 Paroxysmal atrial fibrillation: Secondary | ICD-10-CM

## 2016-12-13 ENCOUNTER — Other Ambulatory Visit: Payer: Self-pay | Admitting: Family Medicine

## 2016-12-14 ENCOUNTER — Other Ambulatory Visit: Payer: Self-pay | Admitting: *Deleted

## 2016-12-14 DIAGNOSIS — I480 Paroxysmal atrial fibrillation: Secondary | ICD-10-CM

## 2016-12-14 DIAGNOSIS — I10 Essential (primary) hypertension: Secondary | ICD-10-CM

## 2016-12-14 MED ORDER — LISINOPRIL 20 MG PO TABS: 20.0000 mg | ORAL_TABLET | Freq: Two times a day (BID) | ORAL | 0 refills | Status: DC

## 2016-12-16 ENCOUNTER — Encounter: Payer: Self-pay | Admitting: Cardiology

## 2016-12-22 ENCOUNTER — Other Ambulatory Visit: Payer: Self-pay | Admitting: Family Medicine

## 2016-12-22 DIAGNOSIS — I480 Paroxysmal atrial fibrillation: Secondary | ICD-10-CM

## 2016-12-22 DIAGNOSIS — I10 Essential (primary) hypertension: Secondary | ICD-10-CM

## 2016-12-22 NOTE — Telephone Encounter (Signed)
Next OV 01/07/17

## 2016-12-22 NOTE — Telephone Encounter (Signed)
Next OV 01/07/17 

## 2016-12-30 LAB — CUP PACEART REMOTE DEVICE CHECK
Battery Remaining Longevity: 33 mo
Brady Statistic AP VP Percent: 1 %
Brady Statistic AP VS Percent: 30 %
Brady Statistic AS VP Percent: 1 %
Brady Statistic AS VS Percent: 70 %
Date Time Interrogation Session: 20180724190941
HighPow Impedance: 43 Ohm
Implantable Lead Implant Date: 20111010
Lead Channel Pacing Threshold Amplitude: 0.75 V
Lead Channel Pacing Threshold Amplitude: 1.25 V
Lead Channel Pacing Threshold Pulse Width: 0.5 ms
Lead Channel Sensing Intrinsic Amplitude: 3.4 mV
Lead Channel Setting Pacing Amplitude: 2.5 V
Lead Channel Setting Sensing Sensitivity: 0.5 mV
MDC IDC LEAD IMPLANT DT: 20111010
MDC IDC LEAD LOCATION: 753859
MDC IDC LEAD LOCATION: 753860
MDC IDC MSMT BATTERY REMAINING PERCENTAGE: 34 %
MDC IDC MSMT BATTERY VOLTAGE: 2.87 V
MDC IDC MSMT LEADCHNL RA IMPEDANCE VALUE: 460 Ohm
MDC IDC MSMT LEADCHNL RA PACING THRESHOLD PULSEWIDTH: 0.9 ms
MDC IDC MSMT LEADCHNL RV IMPEDANCE VALUE: 410 Ohm
MDC IDC MSMT LEADCHNL RV SENSING INTR AMPL: 11.7 mV
MDC IDC PG IMPLANT DT: 20111010
MDC IDC PG SERIAL: 621444
MDC IDC SET LEADCHNL RA PACING AMPLITUDE: 2.5 V
MDC IDC SET LEADCHNL RV PACING PULSEWIDTH: 0.5 ms
MDC IDC STAT BRADY RA PERCENT PACED: 29 %
MDC IDC STAT BRADY RV PERCENT PACED: 1 %

## 2017-01-07 ENCOUNTER — Ambulatory Visit (INDEPENDENT_AMBULATORY_CARE_PROVIDER_SITE_OTHER): Payer: Medicare Other | Admitting: Family Medicine

## 2017-01-07 ENCOUNTER — Encounter: Payer: Self-pay | Admitting: Family Medicine

## 2017-01-07 VITALS — BP 115/75 | HR 60 | Temp 98.1°F | Ht 78.0 in | Wt >= 6400 oz

## 2017-01-07 DIAGNOSIS — I4892 Unspecified atrial flutter: Secondary | ICD-10-CM | POA: Diagnosis not present

## 2017-01-07 DIAGNOSIS — E782 Mixed hyperlipidemia: Secondary | ICD-10-CM | POA: Diagnosis not present

## 2017-01-07 DIAGNOSIS — I10 Essential (primary) hypertension: Secondary | ICD-10-CM | POA: Diagnosis not present

## 2017-01-07 DIAGNOSIS — Z7901 Long term (current) use of anticoagulants: Secondary | ICD-10-CM | POA: Diagnosis not present

## 2017-01-07 DIAGNOSIS — F419 Anxiety disorder, unspecified: Secondary | ICD-10-CM

## 2017-01-07 DIAGNOSIS — I480 Paroxysmal atrial fibrillation: Secondary | ICD-10-CM | POA: Diagnosis not present

## 2017-01-07 DIAGNOSIS — F329 Major depressive disorder, single episode, unspecified: Secondary | ICD-10-CM | POA: Diagnosis not present

## 2017-01-07 DIAGNOSIS — I484 Atypical atrial flutter: Secondary | ICD-10-CM

## 2017-01-07 DIAGNOSIS — F32A Depression, unspecified: Secondary | ICD-10-CM

## 2017-01-07 LAB — COAGUCHEK XS/INR WAIVED
INR: 3.7 — ABNORMAL HIGH (ref 0.9–1.1)
PROTHROMBIN TIME: 44.4 s

## 2017-01-07 MED ORDER — CITALOPRAM HYDROBROMIDE 20 MG PO TABS: 20.0000 mg | ORAL_TABLET | Freq: Every day | ORAL | 2 refills | Status: DC

## 2017-01-07 MED ORDER — LISINOPRIL 20 MG PO TABS: 20.0000 mg | ORAL_TABLET | Freq: Two times a day (BID) | ORAL | 0 refills | Status: DC

## 2017-01-07 MED ORDER — ATORVASTATIN CALCIUM 40 MG PO TABS: 40.0000 mg | ORAL_TABLET | Freq: Every day | ORAL | 3 refills | Status: DC

## 2017-01-07 MED ORDER — DIGOXIN 125 MCG PO TABS: 125.0000 ug | ORAL_TABLET | ORAL | 2 refills | Status: DC

## 2017-01-07 MED ORDER — SPIRONOLACTONE 25 MG PO TABS: 25.0000 mg | ORAL_TABLET | Freq: Every day | ORAL | 3 refills | Status: DC

## 2017-01-07 MED ORDER — CARVEDILOL 25 MG PO TABS: 37.5000 mg | ORAL_TABLET | Freq: Two times a day (BID) | ORAL | 3 refills | Status: DC

## 2017-01-07 MED ORDER — WARFARIN SODIUM 5 MG PO TABS: ORAL_TABLET | ORAL | 3 refills | Status: DC

## 2017-01-07 NOTE — Patient Instructions (Signed)
Anticoagulation Warfarin Dose Instructions as of 01/07/2017      Glynis Smiles Tue Wed Thu Fri Sat   New Dose 5 mg 2.5 mg 5 mg 5 mg 5 mg 2.5 mg 5 mg    Description   Hold tonight Friday, 01/07/2017. current warfarin 5mg  dose -  take 1 tablet every day of the week except for Monday and Fridays  1/2 tablets  INR today 3.7 (goal 2-3)

## 2017-01-07 NOTE — Progress Notes (Signed)
BP 115/75   Pulse 60   Temp 98.1 F (36.7 C) (Oral)   Ht _0  (1.981 m)   Wt (!) 422 lb (191.4 kg)   BMI 48.77 kg/m    Subjective:    Patient ID: Danny Hinton, male    DOB: 1981/03/17, 36 y.o.   MRN: [id number]  HPI: Danny Hinton is a 36 y.o. male presenting on 01/07/2017 for Check protime/INR; Hyperlipidemia; and Hypertension   HPI Hypertension Patient is currently on Coreg and spironolactone and lisinopril and digitoxin recommended from his cardiologist, and their blood pressure today is 115/75. Patient denies any lightheadedness or dizziness. Patient denies headaches, blurred vision, chest pains, shortness of breath, or weakness. Denies any side effects from medication and is content with current medication.   Hyperlipidemia Patient is coming in for recheck of his hyperlipidemia. The patient is currently taking atorvastatin. They deny any issues with myalgias or history of liver damage from it. They deny any focal numbness or weakness or chest pain.   A. fib on chronic anticoagulation Patient is on chronic anticoagulation for A. fib that is controlled with the pacer by cardiology. We manage his Coumadin levels here and today his INR 3.7. He denies any signs of bleeding or blood in his stools. He denies any abnormal bruising. He does say that he was recently restarted on his cholesterol medication and that may be a factor as to why his INR is been adjusted.  Anxiety and depression Patient is coming in for recheck of anxiety and depression and has been on Celexa and says is doing very well for him and he denies any major issues. He denies any suicidal thoughts or ideations. Depression screen [facility] 2/9 01/07/2017 04/12/2016 12/12/2015 02/10/2015 01/22/2015  Decreased Interest 0 1 0 0 0  Down, Depressed, Hopeless 1 2 0 0 0  PHQ - 2 Score 1 3 0 0 0  Altered sleeping - 3 - - -  Tired, decreased energy - 3 - - -  Change in appetite - 3 - - -  Feeling bad or failure about yourself  - 2 - - -    Trouble concentrating - 0 - - -  Moving slowly or fidgety/restless - 0 - - -  Suicidal thoughts - 0 - - -  PHQ-9 Score - 14 - - -     Relevant past medical, surgical, family and social history reviewed and updated as indicated. Interim medical history since our last visit reviewed. Allergies and medications reviewed and updated.  Review of Systems  Constitutional: Negative for chills and fever.  Respiratory: Negative for shortness of breath and wheezing.   Cardiovascular: Negative for chest pain and leg swelling.  Musculoskeletal: Negative for back pain and gait problem.  Skin: Negative for rash.  Neurological: Negative for dizziness, weakness, light-headedness and numbness.  Psychiatric/Behavioral: Negative for decreased concentration, dysphoric mood, self-injury, sleep disturbance and suicidal ideas. The patient is not nervous/anxious.   All other systems reviewed and are negative.   Per HPI unless specifically indicated above     Objective:    BP 115/75   Pulse 60   Temp 98.1 F (36.7 C) (Oral)   Ht _1  (1.981 m)   Wt (!) 422 lb (191.4 kg)   BMI 48.77 kg/m   Wt Readings from Last 3 Encounters:  01/07/17 (!) 422 lb (191.4 kg)  04/12/16 (!) 421 lb (191 kg)  12/12/15 (!) 419 lb 6.4 oz (190.2 kg)    Physical Exam  Constitutional: He is oriented to person, place, and time. He appears well-developed and well-nourished. No distress.  Eyes: Conjunctivae are normal. No scleral icterus.  Cardiovascular: Normal rate, regular rhythm, normal heart sounds and intact distal pulses.   No murmur heard. Pulmonary/Chest: Effort normal and breath sounds normal. No respiratory distress. He has no wheezes. He has no rales.  Musculoskeletal: Normal range of motion. He exhibits no edema.  Neurological: He is alert and oriented to person, place, and time. Coordination normal.  Skin: Skin is warm and dry. No rash noted. He is not diaphoretic.  Psychiatric: He has a normal mood and affect.  His behavior is normal. Judgment normal. His mood appears not anxious. He does not exhibit a depressed mood. He expresses no suicidal ideation. He expresses no suicidal plans.  Nursing note and vitals reviewed.   Anticoagulation Warfarin Dose Instructions as of 01/07/2017      Dorene Grebe Tue Wed Thu Fri Sat   New Dose 5 mg 2.5 mg 5 mg 5 mg 5 mg 2.5 mg 5 mg    Description   Hold tonight Friday, 01/07/2017. current warfarin 17m dose -  take 1 tablet every day of the week except for Monday and Fridays  1/2 tablets  INR today 3.7 (goal 2-3)         Assessment & Plan:   Problem List Items Addressed This Visit      Cardiovascular and Mediastinum   Paroxysmal atrial fibrillation ([place]   Relevant Orders   CoaguChek XS/INR Waived   CBC with Differential/Platelet   Hypertension   Relevant Orders   CMP14+EGFR   Atrial flutter with controlled response ([place]   Relevant Orders   CoaguChek XS/INR Waived   CBC with Differential/Platelet   Atypical atrial flutter ([place]   Relevant Orders   CoaguChek XS/INR Waived   CBC with Differential/Platelet     Other   Hyperlipidemia   Relevant Orders   Lipid panel       Follow up plan: Return in about 6 months (around 07/07/2017), or if symptoms worsen or fail to improve, for See me back in 6 months but come see KErasmo Downeror MSharyn Lullfor INR in 4 weeks.  Counseling provided for all of the vaccine components Orders Placed This Encounter  Procedures  . CoaguChek XS/INR Waived  . CMP14+EGFR  . Lipid panel  . CBC with Differential/Platelet    JCaryl Pina MD [facility] 01/07/2017, 10:40 AM

## 2017-01-08 LAB — CBC WITH DIFFERENTIAL/PLATELET
Basophils Absolute: 0 10*3/uL (ref 0.0–0.2)
Basos: 1 %
EOS (ABSOLUTE): 0.1 10*3/uL (ref 0.0–0.4)
EOS: 3 %
HEMATOCRIT: 36.1 % — AB (ref 37.5–51.0)
HEMOGLOBIN: 11.7 g/dL — AB (ref 13.0–17.7)
IMMATURE GRANS (ABS): 0 10*3/uL (ref 0.0–0.1)
IMMATURE GRANULOCYTES: 0 %
LYMPHS ABS: 1.7 10*3/uL (ref 0.7–3.1)
Lymphs: 43 %
MCH: 28 pg (ref 26.6–33.0)
MCHC: 32.4 g/dL (ref 31.5–35.7)
MCV: 86 fL (ref 79–97)
MONOCYTES: 9 %
Monocytes Absolute: 0.4 10*3/uL (ref 0.1–0.9)
Neutrophils Absolute: 1.8 10*3/uL (ref 1.4–7.0)
Neutrophils: 44 %
Platelets: 286 10*3/uL (ref 150–379)
RBC: 4.18 x10E6/uL (ref 4.14–5.80)
RDW: 15.3 % (ref 12.3–15.4)
WBC: 4.1 10*3/uL (ref 3.4–10.8)

## 2017-01-08 LAB — CMP14+EGFR
ALBUMIN: 4.3 g/dL (ref 3.5–5.5)
ALK PHOS: 64 IU/L (ref 39–117)
ALT: 24 IU/L (ref 0–44)
AST: 14 IU/L (ref 0–40)
Albumin/Globulin Ratio: 1.3 (ref 1.2–2.2)
BILIRUBIN TOTAL: 0.8 mg/dL (ref 0.0–1.2)
BUN / CREAT RATIO: 10 (ref 9–20)
BUN: 9 mg/dL (ref 6–20)
CHLORIDE: 97 mmol/L (ref 96–106)
CO2: 24 mmol/L (ref 20–29)
Calcium: 9 mg/dL (ref 8.7–10.2)
Creatinine, Ser: 0.93 mg/dL (ref 0.76–1.27)
GFR calc Af Amer: 123 mL/min/{1.73_m2} (ref >59)
GFR calc non Af Amer: 106 mL/min/{1.73_m2} (ref >59)
Globulin, Total: 3.4 g/dL (ref 1.5–4.5)
Glucose: 91 mg/dL (ref 65–99)
Potassium: 4.4 mmol/L (ref 3.5–5.2)
Sodium: 135 mmol/L (ref 134–144)
Total Protein: 7.7 g/dL (ref 6.0–8.5)

## 2017-01-08 LAB — LIPID PANEL
CHOLESTEROL TOTAL: 110 mg/dL (ref 100–199)
Chol/HDL Ratio: 3.1 ratio (ref 0.0–5.0)
HDL: 35 mg/dL — ABNORMAL LOW (ref >39)
LDL Calculated: 58 mg/dL (ref 0–99)
Triglycerides: 84 mg/dL (ref 0–149)
VLDL CHOLESTEROL CAL: 17 mg/dL (ref 5–40)

## 2017-01-17 ENCOUNTER — Encounter: Payer: Self-pay | Admitting: Family Medicine

## 2017-01-17 ENCOUNTER — Ambulatory Visit (INDEPENDENT_AMBULATORY_CARE_PROVIDER_SITE_OTHER): Payer: Medicare Other | Admitting: Family Medicine

## 2017-01-17 ENCOUNTER — Telehealth: Payer: Self-pay | Admitting: Family Medicine

## 2017-01-17 VITALS — BP 113/71 | HR 64 | Temp 99.8°F | Ht 78.0 in | Wt >= 6400 oz

## 2017-01-17 DIAGNOSIS — J0100 Acute maxillary sinusitis, unspecified: Secondary | ICD-10-CM | POA: Diagnosis not present

## 2017-01-17 MED ORDER — FLUTICASONE PROPIONATE 50 MCG/ACT NA SUSP: 1.0000 | NASAL | 11 refills | Status: DC | PRN

## 2017-01-17 MED ORDER — CEFDINIR 300 MG PO CAPS: 300.0000 mg | ORAL_CAPSULE | Freq: Two times a day (BID) | ORAL | 0 refills | Status: DC

## 2017-01-17 NOTE — Telephone Encounter (Signed)
Appointment given for today with Dettinger. 

## 2017-01-17 NOTE — Telephone Encounter (Signed)
What symptoms do you have? Head cold, nose stopped up  How long have you been sick? This weekend  Have you been seen for this problem? NO  If your provider decides to give you a prescription, which pharmacy would you like for it to be sent to? [facility] in [place]   Patient informed that this information will be sent to the clinical staff for review and that they should receive a follow up call.

## 2017-01-17 NOTE — Progress Notes (Signed)
BP 113/71   Pulse 64   Temp 99.8 F (37.7 C) (Oral)   Ht 6\' 6"  (1.981 m)   Wt (!) 420 lb (190.5 kg)   BMI 48.54 kg/m    Subjective:    Patient ID: Danny Hinton, male    DOB: June 01, 1980, 36 y.o.   MRN: [id number]  HPI: Danny Hinton is a 36 y.o. male presenting on 01/17/2017 for Sinusitis (sinus congestion, drainage, sore throat)   HPI Sinus congestion and pressure Patient is coming in for a 2 day history of sinus congestion and drainage and sore throat and pressure that's been bothering him. He denies any fevers or chills or shortness of breath or wheezing. He says is been having trouble at night because pelvic drainage down the back of his throat and having trouble breathing at night because of it. He does any sick contacts that he knows of. Attempted to  Relevant past medical, surgical, family and social history reviewed and updated as indicated. Interim medical history since our last visit reviewed. Allergies and medications reviewed and updated.  Review of Systems  Constitutional: Negative for chills and fever.  HENT: Positive for congestion, ear pain, postnasal drip, rhinorrhea, sinus pressure and sore throat. Negative for ear discharge, sneezing and voice change.   Eyes: Negative for pain, discharge, redness and visual disturbance.  Respiratory: Negative for cough, shortness of breath and wheezing.   Cardiovascular: Negative for chest pain and leg swelling.  Musculoskeletal: Negative for gait problem.  Skin: Negative for rash.  All other systems reviewed and are negative.   Per HPI unless specifically indicated above     Objective:    BP 113/71   Pulse 64   Temp 99.8 F (37.7 C) (Oral)   Ht 6\' 6"  (1.981 m)   Wt (!) 420 lb (190.5 kg)   BMI 48.54 kg/m   Wt Readings from Last 3 Encounters:  01/17/17 (!) 420 lb (190.5 kg)  01/07/17 (!) 422 lb (191.4 kg)  04/12/16 (!) 421 lb (191 kg)    Physical Exam  Constitutional: He is oriented to person, place, and time. He  appears well-developed and well-nourished. No distress.  HENT:  Right Ear: Tympanic membrane, external ear and ear canal normal.  Left Ear: Tympanic membrane, external ear and ear canal normal.  Nose: Mucosal edema and rhinorrhea present. No sinus tenderness. No epistaxis. Right sinus exhibits maxillary sinus tenderness. Right sinus exhibits no frontal sinus tenderness. Left sinus exhibits maxillary sinus tenderness. Left sinus exhibits no frontal sinus tenderness.  Mouth/Throat: Uvula is midline and mucous membranes are normal. Posterior oropharyngeal edema and posterior oropharyngeal erythema present. No oropharyngeal exudate or tonsillar abscesses.  Eyes: Conjunctivae are normal. No scleral icterus.  Neck: Neck supple. No thyromegaly present.  Cardiovascular: Normal rate, regular rhythm, normal heart sounds and intact distal pulses.   No murmur heard. Pulmonary/Chest: Effort normal and breath sounds normal. No respiratory distress. He has no wheezes. He has no rales.  Musculoskeletal: Normal range of motion. He exhibits no edema.  Lymphadenopathy:    He has no cervical adenopathy.  Neurological: He is alert and oriented to person, place, and time. Coordination normal.  Skin: Skin is warm and dry. No rash noted. He is not diaphoretic.  Psychiatric: He has a normal mood and affect. His behavior is normal.  Nursing note and vitals reviewed.     Assessment & Plan:   Problem List Items Addressed This Visit    None    Visit Diagnoses  Acute non-recurrent maxillary sinusitis    -  Primary   Relevant Medications   cefdinir (OMNICEF) 300 MG capsule   fluticasone (FLONASE) 50 MCG/ACT nasal spray       Follow up plan: Return if symptoms worsen or fail to improve.  Counseling provided for all of the vaccine components No orders of the defined types were placed in this encounter.   Arville Care, MD [facility] 01/17/2017, 2:30 PM

## 2017-02-03 ENCOUNTER — Other Ambulatory Visit: Payer: Self-pay | Admitting: Family Medicine

## 2017-02-03 DIAGNOSIS — I10 Essential (primary) hypertension: Secondary | ICD-10-CM

## 2017-02-03 DIAGNOSIS — I480 Paroxysmal atrial fibrillation: Secondary | ICD-10-CM

## 2017-02-07 ENCOUNTER — Other Ambulatory Visit: Payer: Self-pay | Admitting: Family Medicine

## 2017-02-07 MED ORDER — CEFDINIR 300 MG PO CAPS: 300.0000 mg | ORAL_CAPSULE | Freq: Two times a day (BID) | ORAL | 0 refills | Status: DC

## 2017-02-07 NOTE — Telephone Encounter (Signed)
Patient aware of recommendations and that omnicef was sent to pharmacy

## 2017-02-07 NOTE — Telephone Encounter (Signed)
What symptoms do you have? Stopped up nose, sore throat, congestion  How long have you been sick? Since 9-29  Have you been seen for this problem? YES  If your provider decides to give you a prescription, which pharmacy would you like for it to be sent to? [place] [facility]   Patient informed that this information will be sent to the clinical staff for review and that they should receive a follow up call.

## 2017-02-08 ENCOUNTER — Other Ambulatory Visit: Payer: Self-pay | Admitting: *Deleted

## 2017-02-08 DIAGNOSIS — I480 Paroxysmal atrial fibrillation: Secondary | ICD-10-CM

## 2017-02-08 DIAGNOSIS — I10 Essential (primary) hypertension: Secondary | ICD-10-CM

## 2017-02-08 MED ORDER — LISINOPRIL 20 MG PO TABS: 20.0000 mg | ORAL_TABLET | Freq: Two times a day (BID) | ORAL | 0 refills | Status: DC

## 2017-02-15 ENCOUNTER — Encounter: Payer: Self-pay | Admitting: Cardiovascular Disease

## 2017-02-15 ENCOUNTER — Ambulatory Visit (INDEPENDENT_AMBULATORY_CARE_PROVIDER_SITE_OTHER): Payer: Medicare Other | Admitting: Cardiovascular Disease

## 2017-02-15 VITALS — BP 133/85 | HR 60 | Ht 78.0 in | Wt >= 6400 oz

## 2017-02-15 DIAGNOSIS — I484 Atypical atrial flutter: Secondary | ICD-10-CM | POA: Diagnosis not present

## 2017-02-15 DIAGNOSIS — Z7901 Long term (current) use of anticoagulants: Secondary | ICD-10-CM

## 2017-02-15 DIAGNOSIS — Z95810 Presence of automatic (implantable) cardiac defibrillator: Secondary | ICD-10-CM

## 2017-02-15 DIAGNOSIS — I5022 Chronic systolic (congestive) heart failure: Secondary | ICD-10-CM

## 2017-02-15 DIAGNOSIS — I480 Paroxysmal atrial fibrillation: Secondary | ICD-10-CM

## 2017-02-15 DIAGNOSIS — I428 Other cardiomyopathies: Secondary | ICD-10-CM | POA: Diagnosis not present

## 2017-02-15 DIAGNOSIS — E782 Mixed hyperlipidemia: Secondary | ICD-10-CM | POA: Diagnosis not present

## 2017-02-15 DIAGNOSIS — I10 Essential (primary) hypertension: Secondary | ICD-10-CM

## 2017-02-15 MED ORDER — SACUBITRIL-VALSARTAN 49-51 MG PO TABS: 1.0000 | ORAL_TABLET | Freq: Two times a day (BID) | ORAL | 3 refills | Status: DC

## 2017-02-15 NOTE — Patient Instructions (Signed)
Dr Royann Shivers has recommended making the following medication changes: 1. STOP Lisinopril 2. START Entresto 49/51 mg 36 HOURS AFTER LAST DOSE OF LISINOPRIL - take 1 tablet by mouth twice daily  Your physician recommends that you return for lab work in 1 month.  Remote monitoring is used to monitor your Pacemaker or ICD from home. This monitoring reduces the number of office visits required to check your device to one time per year. It allows [place] to keep an eye on the functioning of your device to ensure it is working properly. You are scheduled for a device check from home on Tuesday, October 23rd, 2018. You may send your transmission at any time that day. If you have a wireless device, the transmission will be sent automatically. After your physician reviews your transmission, you will receive a notification with your next transmission date.  Your physician recommends that you schedule an appointment in 1 month with one of our clinical pharmacists for medication management/blood pressure check.  Dr Royann Shivers recommends that you schedule a follow-up appointment in 12 months with a defibrillator check. You will receive a reminder letter in the mail two months in advance. If you don't receive a letter, please call our office to schedule the follow-up appointment.  If you need a refill on your cardiac medications before your next appointment, please call your pharmacy.

## 2017-02-15 NOTE — Progress Notes (Signed)
Patient ID: Danny Hinton, male   DOB: 1980-11-02, 36 y.o.   MRN: [id number]    Cardiology Office Note    Date:  02/16/2017   ID:  Danny Hinton, DOB 03-Feb-1981, MRN [id number]  PCP:  Dettinger, Elige Radon, MD  Cardiologist:   Thurmon Fair, MD   Chief Complaint  Patient presents with  . Follow-up    pt c/o SOB with activity    History of Present Illness:  Danny Hinton is a 36 y.o. male who presents for nonischemic cardiomyopathy, chronic systolic heart failure, defibrillator checkup, history of atrial flutter, atrial fibrillation and paroxysmal atrial tachycardia.  He has NYHA functional class III dyspnea on exertion. The day usually starts off with difficulty, but he sort of improves as the day goes on. Symptoms are much worse when the weather is warm or humid. He has not had frank leg edema, orthopnea or PND. He denies angina, palpitations or syncope. He has dizziness with sudden changes in position and bending over makes him very short of breath.  He is on chronic warfarin anticoagulation and has not had any bleeding problems or embolic events. He feels depressed a lot since he is unable to work. Whenever he tries to do anything more strenuous he has to stop because of exhaustion and fatigue and dyspnea.  Interrogation of his device shows normal function. Estimated longevity is 2.3-3.0 years. Presenting rhythm was atrial paced ventricular sensed at 60 bpm. He continues to have occasional episodes of paroxysmal atrial tachycardia but these are no longer with 1:1 AV conduction. In June he had a longer episode for 41 minutes with an atrial rate of 190 per minute but a ventricular rate in the 80s. He does not require ventricular pacing and has atrial pacing roughly a third of the time.  In September 2015 he underwent cavotricuspid isthmus ablation for atrial flutter. On 08/19/2014, while [place] hunting he develop persistent rapid palpitations and felt unwell. He underwent successful overdrive pacing  via his device by Dr. Hillis Range. The rhythm was atypical atrial flutter, cycle length roughly 390 ms. The episode lasted for about 3 hours until he was successfully overdrive paced. In the past he has had paroxysmal atrial fibrillation. The decision was made to ablate his flutter secondary to the occurrence of multiple unnecessary defibrillator shocks in the setting of atrial flutter with rapid ventricular rate.  His St. Jude Fortify DR model [phone] defibrillator is under advisory for possible unexpected battery depletion. He is not device dependent and has never had shocks for true ventricular arrhythmia (although he has had inappropriate shocks for atrial flutter with rapid AV conduction). We discussed the potential for device failure in a low percentage of this particular model. There is no reason to proceed with early generator change out in his particular case.   Past Medical History:  Diagnosis Date  . Allergy   . Anxiety   . Atypical atrial flutter (HCC) 08/19/14   pace terminated in device clinic.  AFL cycle length was 390 msec  . Automatic implantable cardioverter-defibrillator in situ    a. St Jude in 2011.  Marland Kitchen Chronic anticoagulation   . Chronic systolic congestive heart failure (HCC)    a. suspected NICM - dx 2010. EF 15% by TEE, 10-20% by echo at that time. b. s/p St. Jude AICD 2011. c. Echo 01/2010: mod dilated LV, EF 30%, mod aortic root dilitation, no significant valvular disease.  . Eczema   . Eczema   . Hypertension   .  Mobitz type 2 second degree atrioventricular block    a. During sleeping hours in 2010 suspected due to ? OSA.  . Morbid obesity (HCC)   . Nonischemic cardiomyopathy (HCC)   . PAF (paroxysmal atrial fibrillation) (HCC)   . Paroxysmal atrial flutter [facility])    s/p ablation    Past Surgical History:  Procedure Laterality Date  . ATRIAL FLUTTER ABLATION N/A 01/18/2014   Procedure: ATRIAL FLUTTER ABLATION;  Surgeon: Duke Salvia, MD;  Location: [facility] CATH  LAB;  Service: Cardiovascular;  Laterality: N/A;  . CARDIAC DEFIBRILLATOR PLACEMENT  02/17/10   St. Jude Medical 45DR, model number [id number], serial number [id number]  . CARDIOVERSION N/A 12/29/2013   Procedure: CARDIOVERSION;  Surgeon: Duke Salvia, MD;  Location: [facility] OR;  Service: Cardiovascular;  Laterality: N/A;  . TOOTH EXTRACTION N/A 10/23/2012   Procedure: EXTRACTION TEETH 1, 16, 17, 30, 31;  Surgeon: [name], DDS;  Location: [facility] OR;  Service: Oral Surgery;  Laterality: N/A;    Outpatient Medications Prior to Visit  Medication Sig Dispense Refill  . atorvastatin (LIPITOR) 40 MG tablet Take 1 tablet (40 mg total) by mouth daily with breakfast. 90 tablet 3  . carvedilol (COREG) 25 MG tablet Take 1.5 tablets (37.5 mg total) by mouth 2 (two) times daily with a meal. 270 tablet 3  . cefdinir (OMNICEF) 300 MG capsule Take 1 capsule (300 mg total) by mouth 2 (two) times daily. 1 po BID 20 capsule 0  . citalopram (CELEXA) 20 MG tablet Take 1 tablet (20 mg total) by mouth daily. 90 tablet 2  . digoxin (LANOXIN) 0.125 MG tablet Take 1 tablet (125 mcg total) by mouth every other day. 45 tablet 2  . fluticasone (FLONASE) 50 MCG/ACT nasal spray Place 1 spray into both nostrils as needed. 16 g 11  . furosemide (LASIX) 40 MG tablet TAKE ONE TABLET BY MOUTH ONCE DAILY AS NEEDED FOR FLUID 30 tablet 3  . spironolactone (ALDACTONE) 25 MG tablet Take 1 tablet (25 mg total) by mouth daily. 90 tablet 3  . triamcinolone cream (KENALOG) 0.1 % Apply topically 2 (two) times daily as needed. 453.6 g 2  . warfarin (COUMADIN) 5 MG tablet Take 1 tablet daily except take 1 and 1/2 tablets each Friday or as directed by anticoagulation clinic 100 tablet 3  . lisinopril (PRINIVIL,ZESTRIL) 20 MG tablet Take 1 tablet (20 mg total) by mouth 2 (two) times daily. 180 tablet 0   No facility-administered medications prior to visit.      Allergies:   Patient has no known allergies.   Social History   Social History    . Marital status: Married    Spouse name: N/A  . Number of children: N/A  . Years of education: N/A   Social History Main Topics  . Smoking status: Never Smoker  . Smokeless tobacco: Never Used  . Alcohol use No  . Drug use: No  . Sexual activity: Yes   Other Topics Concern  . None   Social History Narrative  . None     Family History:  The patient's family history includes Cancer (age of onset: 63) in his brother; Diabetes in his paternal grandmother; Heart disease in his maternal grandfather and mother.   ROS:   Please see the history of present illness.    ROS All other systems reviewed and are negative.   PHYSICAL EXAM:   VS:  BP 133/85   Pulse 60   Ht  (1.981  m)   Wt (!) 424 lb (192.3 kg)   SpO2 96%   BMI 49.00 kg/m     General: Alert, oriented x3, no distress, morbidly obese Head: no evidence of trauma, PERRL, EOMI, no exophtalmos or lid lag, no myxedema, no xanthelasma; normal ears, nose and oropharynx Neck: Obesity limits evaluation of jugular venous pulsations and no hepatojugular reflux; brisk carotid pulses without delay and no carotid bruits Chest: clear to auscultation, no signs of consolidation by percussion or palpation, normal fremitus, symmetrical and full respiratory excursions Cardiovascular: Limited exam due to body habitus, normal position and quality of the apical impulse, regular rhythm, normal first and second heart sounds, no murmurs, rubs or gallops Abdomen: no tenderness or distention, no masses by palpation, no abnormal pulsatility or arterial bruits, normal bowel sounds, no hepatosplenomegaly Extremities: no clubbing, cyanosis or edema; 2+ radial, ulnar and brachial pulses bilaterally; 2+ right femoral, posterior tibial and dorsalis pedis pulses; 2+ left femoral, posterior tibial and dorsalis pedis pulses; no subclavian or femoral bruits Neurological: grossly nonfocal Psych: Normal mood and affect   Wt Readings from Last 3 Encounters:   02/15/17 (!) 424 lb (192.3 kg)  01/17/17 (!) 420 lb (190.5 kg)  01/07/17 (!) 422 lb (191.4 kg)      Studies/Labs Reviewed:   EKG:  EKG is ordered today.  The ekg ordered today demonstrates atrial paced, ventricular sensed rhythm at a rate of 60 bpm, QTC 392 ms  Recent Labs: 01/07/2017: ALT 24; BUN 9; Creatinine, Ser 0.93; Hemoglobin 11.7; Platelets 286; Potassium 4.4; Sodium 135   Lipid Panel    Component Value Date/Time   CHOL 110 01/07/2017 1057   TRIG 84 01/07/2017 1057   HDL 35 (L) 01/07/2017 1057   CHOLHDL 3.1 01/07/2017 1057   CHOLHDL 5.3 03/20/2009 0410   VLDL 22 03/20/2009 0410   LDLCALC 58 01/07/2017 1057   LDLDIRECT 52 03/26/2015 0824     ASSESSMENT:    1. Chronic systolic congestive heart failure (HCC)   2. Non-ischemic cardiomyopathy (HCC)   3. Paroxysmal atrial fibrillation (HCC)   4. Long term current use of anticoagulant therapy   5. Atypical atrial flutter (HCC)   6. Automatic implantable cardioverter-defibrillator in situ   7. Morbid obesity (HCC)   8. Mixed hyperlipidemia   9. Essential hypertension      PLAN:  In order of problems listed above:  1. CHF: NYHA functional class IIIa, hard to accurately assess volume status due to obesity. On appropriate high doses of carvedilol and ACE inhibitor. Reminded him how important is to avoid sodium excess, daily weights, signs and symptoms of heart failure exacerbation. 2. CMP: Most recent LV ejection fraction 20-25%by echocardiogram 2015 3. AFib: No recent recurrence. CHADSVasc 2 (CHF, HTN). 4. Warfarin: Followed by his primary care provider. No bleeding complications and no embolic events. 5. PAT/atypical flutter: Continues to have episodes of atypical atrial flutter and atrial tachycardia these usually occur with some degree of AV block and he has not had high ventricular rate, definitely not close to the range at which he could get defibrillator therapy 6. AICD advisory: Discussed the potential problems  with his defibrillator model. He is not pacemaker dependent and has not required appropriate defibrillator therapy for ventricular tachyarrhythmia. Will continue with frequent battery checks, but no plan for early generator change out at this time. 7. Obesity: He is discouraged about inability to lose weight, but I told him that it's worth the continued effort. 8. HLP: Excellent recent lipid profile, other  than low HDL, which will not improve without substantial weight loss. 9. HTN: Good control, usually much lower than it is today   Medication Adjustments/Labs and Tests Ordered: Current medicines are reviewed at length with the patient today.  Concerns regarding medicines are outlined above.  Medication changes, Labs and Tests ordered today are listed in the Patient Instructions below. Patient Instructions  Dr Royann Shivers has recommended making the following medication changes: 1. STOP Lisinopril 2. START Entresto 49/51 mg 36 HOURS AFTER LAST DOSE OF LISINOPRIL - take 1 tablet by mouth twice daily  Your physician recommends that you return for lab work in 1 month.  Remote monitoring is used to monitor your Pacemaker or ICD from home. This monitoring reduces the number of office visits required to check your device to one time per year. It allows [place] to keep an eye on the functioning of your device to ensure it is working properly. You are scheduled for a device check from home on Tuesday, October 23rd, 2018. You may send your transmission at any time that day. If you have a wireless device, the transmission will be sent automatically. After your physician reviews your transmission, you will receive a notification with your next transmission date.  Your physician recommends that you schedule an appointment in 1 month with one of our clinical pharmacists for medication management/blood pressure check.  Dr Royann Shivers recommends that you schedule a follow-up appointment in 12 months with a defibrillator  check. You will receive a reminder letter in the mail two months in advance. If you don't receive a letter, please call our office to schedule the follow-up appointment.  If you need a refill on your cardiac medications before your next appointment, please call your pharmacy.      Signed, Thurmon Fair, MD  02/16/2017 7:05 PM    [facility] [address] [place], [place], [place]  [address] Phone: [phone]; Fax: [phone]

## 2017-02-23 ENCOUNTER — Telehealth: Payer: Self-pay | Admitting: Cardiovascular Disease

## 2017-02-23 NOTE — Telephone Encounter (Signed)
NEw Message  Pt call requesting to speak with RN to discuss his blood work that needs to be completed for Dr. Royann Shivers. Please call back.

## 2017-02-23 NOTE — Telephone Encounter (Signed)
Advised patient follow up labs to be done in about 1 month prior to follow up with Pharm D

## 2017-03-01 ENCOUNTER — Encounter: Payer: Medicare Other | Admitting: *Deleted

## 2017-03-03 ENCOUNTER — Encounter: Payer: Self-pay | Admitting: Cardiology

## 2017-03-04 ENCOUNTER — Encounter: Payer: Self-pay | Admitting: Family Medicine

## 2017-03-04 ENCOUNTER — Ambulatory Visit (INDEPENDENT_AMBULATORY_CARE_PROVIDER_SITE_OTHER): Payer: Medicare Other | Admitting: Family Medicine

## 2017-03-04 VITALS — BP 111/76 | HR 60 | Temp 98.0°F | Ht 78.0 in | Wt >= 6400 oz

## 2017-03-04 DIAGNOSIS — R109 Unspecified abdominal pain: Secondary | ICD-10-CM | POA: Diagnosis not present

## 2017-03-04 LAB — URINALYSIS
BILIRUBIN UA: NEGATIVE
Glucose, UA: NEGATIVE
Ketones, UA: NEGATIVE
Leukocytes, UA: NEGATIVE
Nitrite, UA: NEGATIVE
PH UA: 5.5 (ref 5.0–7.5)
Protein, UA: NEGATIVE
Specific Gravity, UA: 1.025 (ref 1.005–1.030)
UUROB: 0.2 mg/dL (ref 0.2–1.0)

## 2017-03-04 MED ORDER — TRAMADOL HCL 50 MG PO TABS: 50.0000 mg | ORAL_TABLET | Freq: Four times a day (QID) | ORAL | 0 refills | Status: DC | PRN

## 2017-03-04 MED ORDER — CYCLOBENZAPRINE HCL 10 MG PO TABS: 10.0000 mg | ORAL_TABLET | Freq: Three times a day (TID) | ORAL | 0 refills | Status: DC | PRN

## 2017-03-04 NOTE — Progress Notes (Signed)
Subjective:  Patient ID: Danny E Steege, male    DOB: 1980/09/13  Age: 36 y.o. MRN: [id number]  CC: Flank Pain (pt here today c/o left side pain for about a week.)   HPI Danny Hinton presents for Onset of acute moderately severe left flank pain for 5-7 days. It has been steady and increasing. It is constant. It seems to be rather sharp at times but there is always a dull ache. Activities primarily twisting can affect it. There is no radiation. No dysuria. He denies nausea. No fever chills or sweats. After discussion he actually says it feels about like a pulled muscle he had in his back several months ago.  Depression screen [facility] 2/9 03/04/2017 01/17/2017 01/07/2017  Decreased Interest 0 0 0  Down, Depressed, Hopeless 1 1 1   PHQ - 2 Score 1 1 1   Altered sleeping - - -  Tired, decreased energy - - -  Change in appetite - - -  Feeling bad or failure about yourself  - - -  Trouble concentrating - - -  Moving slowly or fidgety/restless - - -  Suicidal thoughts - - -  PHQ-9 Score - - -    History Danny has a past medical history of Allergy; Anxiety; Atypical atrial flutter (HCC) (08/19/14); Automatic implantable cardioverter-defibrillator in situ; Chronic anticoagulation; Chronic systolic congestive heart failure (HCC); Eczema; Eczema; Hypertension; Mobitz type 2 second degree atrioventricular block; Morbid obesity (HCC); Nonischemic cardiomyopathy (HCC); PAF (paroxysmal atrial fibrillation) (HCC); and Paroxysmal atrial flutter (HCC).   He has a past surgical history that includes Cardiac defibrillator placement (02/17/10); Tooth Extraction (N/A, 10/23/2012); Cardioversion (N/A, 12/29/2013); and Atrial flutter ablation (N/A, 01/18/2014).   His family history includes Cancer (age of onset: 47) in his brother; Diabetes in his paternal grandmother; Heart disease in his maternal grandfather and mother.He reports that he has never smoked. He has never used smokeless tobacco. He reports that he does not drink  alcohol or use drugs.    ROS Review of Systems  Constitutional: Negative for chills, diaphoresis and fever.  HENT: Negative for rhinorrhea and sore throat.   Respiratory: Negative for cough and shortness of breath.   Cardiovascular: Negative for chest pain.  Gastrointestinal: Negative for abdominal pain.  Genitourinary: Positive for flank pain. Negative for difficulty urinating, dysuria, frequency and hematuria.  Musculoskeletal: Positive for myalgias. Negative for arthralgias.  Skin: Negative for rash.  Neurological: Negative for weakness and headaches.    Objective:  BP 111/76   Pulse 60   Temp 98 F (36.7 C) (Oral)   Ht 6\' 6"  (1.981 m)   Wt (!) 425 lb (192.8 kg)   BMI 49.11 kg/m   BP Readings from Last 3 Encounters:  03/04/17 111/76  02/15/17 133/85  01/17/17 113/71    Wt Readings from Last 3 Encounters:  03/04/17 (!) 425 lb (192.8 kg)  02/15/17 (!) 424 lb (192.3 kg)  01/17/17 (!) 420 lb (190.5 kg)     Physical Exam  Constitutional: He is oriented to person, place, and time. He appears well-developed and well-nourished. No distress.  HENT:  Head: Normocephalic.  Eyes: Pupils are equal, round, and reactive to light.  Neck: Normal range of motion.  Cardiovascular: Normal rate, regular rhythm and normal heart sounds.   No murmur heard. Pulmonary/Chest: Effort normal and breath sounds normal.  Abdominal: He exhibits no mass (obese). There is tenderness (at the costal margin inferior to the angle of the scapula on the left for palpation. The abdomen is  other wise benign). There is no rebound and no guarding.  Musculoskeletal: He exhibits tenderness.  Neurological: He is alert and oriented to person, place, and time. He has normal reflexes.  Skin: Skin is warm and dry.  Psychiatric: His behavior is normal. Thought content normal.  Vitals reviewed.     Assessment & Plan:   Danny Hinton was seen today for flank pain.  Diagnoses and all orders for this visit:  Flank  pain -     Urinalysis -     Urine Culture  Other orders -     cyclobenzaprine (FLEXERIL) 10 MG tablet; Take 1 tablet (10 mg total) by mouth 3 (three) times daily as needed for muscle spasms. -     traMADol (ULTRAM) 50 MG tablet; Take 1 tablet (50 mg total) by mouth every 6 (six) hours as needed for moderate pain.   His urinalysis does show 1+ hematuria. Because of this I considered urogram. Patient was reluctant. We opted for a trial of conservative therapy with muscle relaxer and tramadol. Resting the area and applying heat recommended. If his symptoms become more pronounced in the genital area or her start radiating and getting worse instead of improving with time we will reconsider the urogram and he will let me know.    I have discontinued Danny Hinton's cefdinir. I am also having him start on cyclobenzaprine and traMADol. Additionally, I am having him maintain his triamcinolone cream, furosemide, citalopram, warfarin, spironolactone, digoxin, carvedilol, atorvastatin, fluticasone, and sacubitril-valsartan.  Allergies as of 03/04/2017   No Known Allergies     Medication List       Accurate as of 03/04/17 11:46 AM. Always use your most recent med list.          atorvastatin 40 MG tablet Commonly known as:  LIPITOR Take 1 tablet (40 mg total) by mouth daily with breakfast.   carvedilol 25 MG tablet Commonly known as:  COREG Take 1.5 tablets (37.5 mg total) by mouth 2 (two) times daily with a meal.   citalopram 20 MG tablet Commonly known as:  CELEXA Take 1 tablet (20 mg total) by mouth daily.   cyclobenzaprine 10 MG tablet Commonly known as:  FLEXERIL Take 1 tablet (10 mg total) by mouth 3 (three) times daily as needed for muscle spasms.   digoxin 0.125 MG tablet Commonly known as:  LANOXIN Take 1 tablet (125 mcg total) by mouth every other day.   fluticasone 50 MCG/ACT nasal spray Commonly known as:  FLONASE Place 1 spray into both nostrils as needed.   furosemide 40  MG tablet Commonly known as:  LASIX TAKE ONE TABLET BY MOUTH ONCE DAILY AS NEEDED FOR FLUID   sacubitril-valsartan 49-51 MG Commonly known as:  ENTRESTO Take 1 tablet by mouth 2 (two) times daily.   spironolactone 25 MG tablet Commonly known as:  ALDACTONE Take 1 tablet (25 mg total) by mouth daily.   traMADol 50 MG tablet Commonly known as:  ULTRAM Take 1 tablet (50 mg total) by mouth every 6 (six) hours as needed for moderate pain.   triamcinolone cream 0.1 % Commonly known as:  KENALOG Apply topically 2 (two) times daily as needed.   warfarin 5 MG tablet Commonly known as:  COUMADIN Take 1 tablet daily except take 1 and 1/2 tablets each Friday or as directed by anticoagulation clinic        Follow-up: Return in about 1 week (around 03/11/2017), or if symptoms worsen or fail to improve.  Mechele ClaudeWarren Shafer Swamy,  M.D. 

## 2017-03-06 LAB — URINE CULTURE

## 2017-03-07 ENCOUNTER — Other Ambulatory Visit: Payer: Self-pay | Admitting: Family Medicine

## 2017-03-07 MED ORDER — SULFAMETHOXAZOLE-TRIMETHOPRIM 800-160 MG PO TABS: 1.0000 | ORAL_TABLET | Freq: Two times a day (BID) | ORAL | 0 refills | Status: DC

## 2017-03-14 ENCOUNTER — Encounter: Payer: Medicare Other | Admitting: *Deleted

## 2017-03-16 DIAGNOSIS — I5022 Chronic systolic (congestive) heart failure: Secondary | ICD-10-CM | POA: Diagnosis not present

## 2017-03-16 NOTE — Progress Notes (Signed)
03/17/2017 Danny Hinton 02/01/1981 [id number]   HPI:  Danny E Malek is a 36 y.o. male patient of Dr Royann Shivers, with a PMH below who presents today for Entresto titration.  He has NYHA functional class IIIa heart failure and at his las MD visit was switched from lisinopril to [place].  Because of his obesity it has been difficult to assess volume status.  His most recent EF was at 20-25% in 2015.  In addition to CHF, he also has hypertension, hyperlipidemia, atrial fibrillation (on warfarin monitored by PCP), obesity and atypical flutter.   Today he reports feeling well.  He admits to occasional dizziness with positional changes, but no other concerns.  He has been on samples of the Entresto 49/51  Blood Pressure Goal:  130/80  Current Medications:  Entresto 49/51  Carvedilol 37.5 mg bid  Digoxin 0.125 mg qd  Furosemide 40 mg prn  Spironolactone 25 mg qd   Family Hx:  mgf - MI, CABG x 3, died at 35  No other specific cardiac issues known in his family  Social Hx:  No tobacco or alcohol  Diet:  No added salt  Exercise:  Walking daily - about 1/2 mile and trying to build up on that.  Will do more as hunting season starts soon  Home BP readings:  Has not been using home BP cuff  Labs:  12/2016:  Na 135, K 4.4, Glu 91, BUN 9, SCr 0.93  03/2017:  Na 136, K 4.3, Glu 118, BUN 11 Scr 0.83  Wt Readings from Last 3 Encounters:  03/04/17 (!) 425 lb (192.8 kg)  02/15/17 (!) 424 lb (192.3 kg)  01/17/17 (!) 420 lb (190.5 kg)   BP Readings from Last 3 Encounters:  03/17/17 100/62  03/04/17 111/76  02/15/17 133/85   Pulse Readings from Last 3 Encounters:  03/17/17 64  03/04/17 60  02/15/17 60    Current Outpatient Medications  Medication Sig Dispense Refill  . atorvastatin (LIPITOR) 40 MG tablet Take 1 tablet (40 mg total) by mouth daily with breakfast. 90 tablet 3  . carvedilol (COREG) 25 MG tablet Take 1.5 tablets (37.5 mg total) by mouth 2 (two) times daily with a meal.  270 tablet 3  . citalopram (CELEXA) 20 MG tablet Take 1 tablet (20 mg total) by mouth daily. 90 tablet 2  . cyclobenzaprine (FLEXERIL) 10 MG tablet Take 1 tablet (10 mg total) by mouth 3 (three) times daily as needed for muscle spasms. 90 tablet 0  . digoxin (LANOXIN) 0.125 MG tablet Take 1 tablet (125 mcg total) by mouth every other day. 45 tablet 2  . fluticasone (FLONASE) 50 MCG/ACT nasal spray Place 1 spray into both nostrils as needed. 16 g 11  . furosemide (LASIX) 40 MG tablet TAKE ONE TABLET BY MOUTH ONCE DAILY AS NEEDED FOR FLUID 30 tablet 3  . sacubitril-valsartan (ENTRESTO) 49-51 MG Take 1 tablet by mouth 2 (two) times daily. 180 tablet 3  . spironolactone (ALDACTONE) 25 MG tablet Take 1 tablet (25 mg total) by mouth daily. 90 tablet 3  . sulfamethoxazole-trimethoprim (BACTRIM DS,SEPTRA DS) 800-160 MG tablet Take 1 tablet by mouth 2 (two) times daily. Until gone, for infection 28 tablet 0  . traMADol (ULTRAM) 50 MG tablet Take 1 tablet (50 mg total) by mouth every 6 (six) hours as needed for moderate pain. 30 tablet 0  . triamcinolone cream (KENALOG) 0.1 % Apply topically 2 (two) times daily as needed. 453.6 g 2  .  warfarin (COUMADIN) 5 MG tablet Take 1 tablet daily except take 1 and 1/2 tablets each Friday or as directed by anticoagulation clinic 100 tablet 3   No current facility-administered medications for this visit.     No Known Allergies  Past Medical History:  Diagnosis Date  . Allergy   . Anxiety   . Atypical atrial flutter (HCC) 08/19/14   pace terminated in device clinic.  AFL cycle length was 390 msec  . Automatic implantable cardioverter-defibrillator in situ    a. St Jude in 2011.  Marland Kitchen. Chronic anticoagulation   . Chronic systolic congestive heart failure (HCC)    a. suspected NICM - dx 2010. EF 15% by TEE, 10-20% by echo at that time. b. s/p St. Jude AICD 2011. c. Echo 01/2010: mod dilated LV, EF 30%, mod aortic root dilitation, no significant valvular disease.  .  Eczema   . Eczema   . Hypertension   . Mobitz type 2 second degree atrioventricular block    a. During sleeping hours in 2010 suspected due to ? OSA.  . Morbid obesity (HCC)   . Nonischemic cardiomyopathy (HCC)   . PAF (paroxysmal atrial fibrillation) (HCC)   . Paroxysmal atrial flutter (HCC)    s/p ablation    Blood pressure 100/62, pulse 64.   Standking 92/66  Chronic systolic congestive heart failure Patient with NYHA class IIIa heart failure and EF 20-25%.  Started Entresto 49/51 about 1 month ago and has done well.  Because his blood pressure is only at 100/62, will not increase the Entresto further.   Patient was advised to monitor home BP readings a few times each week, and should they read consistently < 100 or > 130 systolic, he should call the office.     Phillips HayKristin Angila Wombles PharmD CPP [facility] [address] [place], [place] [address] [phone]

## 2017-03-17 ENCOUNTER — Encounter: Payer: Self-pay | Admitting: Family Medicine

## 2017-03-17 ENCOUNTER — Ambulatory Visit (INDEPENDENT_AMBULATORY_CARE_PROVIDER_SITE_OTHER): Payer: Medicare Other | Admitting: Pharmacist Clinician (PhC)/ Clinical Pharmacy Specialist

## 2017-03-17 ENCOUNTER — Encounter: Payer: Self-pay | Admitting: Pharmacist Clinician (PhC)/ Clinical Pharmacy Specialist

## 2017-03-17 DIAGNOSIS — I5022 Chronic systolic (congestive) heart failure: Secondary | ICD-10-CM

## 2017-03-17 LAB — BASIC METABOLIC PANEL
BUN / CREAT RATIO: 13 (ref 9–20)
BUN: 11 mg/dL (ref 6–20)
CALCIUM: 9 mg/dL (ref 8.7–10.2)
CHLORIDE: 100 mmol/L (ref 96–106)
CO2: 23 mmol/L (ref 20–29)
Creatinine, Ser: 0.83 mg/dL (ref 0.76–1.27)
GFR calc Af Amer: 132 mL/min/{1.73_m2} (ref >59)
GFR calc non Af Amer: 114 mL/min/{1.73_m2} (ref >59)
GLUCOSE: 118 mg/dL — AB (ref 65–99)
POTASSIUM: 4.3 mmol/L (ref 3.5–5.2)
Sodium: 136 mmol/L (ref 134–144)

## 2017-03-17 NOTE — Progress Notes (Signed)
Agreed, Thank you MCr

## 2017-03-17 NOTE — Assessment & Plan Note (Signed)
Patient with NYHA class IIIa heart failure and EF 20-25%.  Started Entresto 49/51 about 1 month ago and has done well.  Because his blood pressure is only at 100/62, will not increase the Entresto further.   Patient was advised to monitor home BP readings a few times each week, and should they read consistently < 100 or > 130 systolic, he should call the office.

## 2017-03-17 NOTE — Patient Instructions (Signed)
   Your blood pressure today is 100/62  Check your blood pressure at home several times each week and keep record of the readings.  Take your BP meds as follows:  Continue with your current medications  Bring all of your meds, your BP cuff and your record of home blood pressures to your next appointment.  Exercise as you're able, try to walk approximately 30 minutes per day.  Keep salt intake to a minimum, especially watch canned and prepared boxed foods.  Eat more fresh fruits and vegetables and fewer canned items.  Avoid eating in fast food restaurants.    HOW TO TAKE YOUR BLOOD PRESSURE: . Rest 5 minutes before taking your blood pressure. .  Don't smoke or drink caffeinated beverages for at least 30 minutes before. . Take your blood pressure before (not after) you eat. . Sit comfortably with your back supported and both feet on the floor (don't cross your legs). . Elevate your arm to heart level on a table or a desk. . Use the proper sized cuff. It should fit smoothly and snugly around your bare upper arm. There should be enough room to slip a fingertip under the cuff. The bottom edge of the cuff should be 1 inch above the crease of the elbow. . Ideally, take 3 measurements at one sitting and record the average.

## 2017-04-07 LAB — CUP PACEART INCLINIC DEVICE CHECK
Date Time Interrogation Session: 20181129124729
Implantable Lead Location: 753859
MDC IDC LEAD IMPLANT DT: 20111010
MDC IDC LEAD IMPLANT DT: 20111010
MDC IDC LEAD LOCATION: 753860
MDC IDC PG IMPLANT DT: 20111010
MDC IDC PG SERIAL: 621444

## 2017-04-08 ENCOUNTER — Other Ambulatory Visit: Payer: Self-pay

## 2017-04-08 DIAGNOSIS — Z7901 Long term (current) use of anticoagulants: Secondary | ICD-10-CM

## 2017-04-08 DIAGNOSIS — I480 Paroxysmal atrial fibrillation: Secondary | ICD-10-CM

## 2017-04-08 MED ORDER — WARFARIN SODIUM 5 MG PO TABS: ORAL_TABLET | ORAL | 3 refills | Status: DC

## 2017-05-26 ENCOUNTER — Ambulatory Visit (INDEPENDENT_AMBULATORY_CARE_PROVIDER_SITE_OTHER): Payer: Medicare Other | Admitting: *Deleted

## 2017-05-26 DIAGNOSIS — I480 Paroxysmal atrial fibrillation: Secondary | ICD-10-CM

## 2017-05-26 DIAGNOSIS — Z7901 Long term (current) use of anticoagulants: Secondary | ICD-10-CM

## 2017-05-26 LAB — COAGUCHEK XS/INR WAIVED
INR: 1.7 — ABNORMAL HIGH (ref 0.9–1.1)
PROTHROMBIN TIME: 20.4 s

## 2017-05-26 NOTE — Progress Notes (Signed)
Subjective:     Indication: atrial fibrillation Bleeding signs/symptoms: None Thromboembolic signs/symptoms: None  Missed Coumadin doses: None Medication changes: no Dietary changes: no Bacterial/viral infection: no Other concerns: yes - Patient does not come in regularly for INRs. At his last visit in August he was supratherapeutic due to Bactrim. Reports that he takes his coumadin regularly and that he has not had any other medication changes.   The following portions of the patient's history were reviewed and updated as appropriate: allergies and current medications.  Review of Systems Pertinent items are noted in HPI.   Objective:    INR Today: 1.7  Current dose: 7.5mg  on Friday and 5 mg all other days.     Assessment:    Subtherapeutic INR for goal of 2-3   Plan:    1. New dose: Take 7.5 mg today and then resume normal schedule of 7.5mg  on Friday and 5 mg all other days.    2. Next INR: 2 weeks  Demetrios Loll, RN

## 2017-06-06 ENCOUNTER — Ambulatory Visit (INDEPENDENT_AMBULATORY_CARE_PROVIDER_SITE_OTHER): Payer: Medicare Other | Admitting: *Deleted

## 2017-06-06 DIAGNOSIS — I428 Other cardiomyopathies: Secondary | ICD-10-CM | POA: Diagnosis not present

## 2017-06-09 ENCOUNTER — Encounter: Payer: Self-pay | Admitting: Family Medicine

## 2017-06-09 ENCOUNTER — Ambulatory Visit (INDEPENDENT_AMBULATORY_CARE_PROVIDER_SITE_OTHER): Payer: Medicare Other | Admitting: Family Medicine

## 2017-06-09 VITALS — BP 129/75 | HR 63 | Temp 97.0°F | Ht 78.0 in | Wt >= 6400 oz

## 2017-06-09 DIAGNOSIS — I484 Atypical atrial flutter: Secondary | ICD-10-CM | POA: Diagnosis not present

## 2017-06-09 DIAGNOSIS — F339 Major depressive disorder, recurrent, unspecified: Secondary | ICD-10-CM

## 2017-06-09 DIAGNOSIS — Z7901 Long term (current) use of anticoagulants: Secondary | ICD-10-CM | POA: Diagnosis not present

## 2017-06-09 DIAGNOSIS — F32A Depression, unspecified: Secondary | ICD-10-CM | POA: Insufficient documentation

## 2017-06-09 DIAGNOSIS — I4892 Unspecified atrial flutter: Secondary | ICD-10-CM | POA: Diagnosis not present

## 2017-06-09 DIAGNOSIS — I480 Paroxysmal atrial fibrillation: Secondary | ICD-10-CM | POA: Diagnosis not present

## 2017-06-09 DIAGNOSIS — Z6841 Body Mass Index (BMI) 40.0 and over, adult: Secondary | ICD-10-CM | POA: Insufficient documentation

## 2017-06-09 LAB — COAGUCHEK XS/INR WAIVED
INR: 1.7 — ABNORMAL HIGH (ref 0.9–1.1)
PROTHROMBIN TIME: 20.3 s

## 2017-06-09 MED ORDER — BUPROPION HCL ER (XL) 150 MG PO TB24: 150.0000 mg | ORAL_TABLET | Freq: Every day | ORAL | 1 refills | Status: DC

## 2017-06-09 NOTE — Progress Notes (Signed)
BP 129/75   Pulse 63   Temp (!) 97 F (36.1 C) (Oral)   Ht 6\' 6"  (1.981 m)   Wt (!) 438 lb (198.7 kg)   BMI 50.62 kg/m    Subjective:    Patient ID: Danny Hinton, male    DOB: [birth year], 37 y.o.   MRN: [id number]  HPI: Danny E Hickson is a 37 y.o. male presenting on 06/09/2017 for Coagulation Disorder   HPI A. fib and anticoagulation recheck Patient is coming in with known history of paroxysmal A. fib and recheck for his Coumadin.  His INR today is 1.7 and he says that he is currently taking 7.5 mg on Fridays and 5 mg every other day of the week.  He denies any issues with excessive bruising or bleeding.  He denies any blood in his urine or stool.  Depression and anxiety Patient has been on Celexa 20 mg for depression and anxiety and he feels like it is just not working or doing anything anymore.  He says is been feeling very anxious and very stressed and very depressed both stemming from his health issues and inability to do the things that he has done previously and the threat of possibly losing his disability.  He denies any suicidal ideations of hurting himself.  He denies any feelings of major depression but just feels a lot of anxiousness for him. Depression screen [facility] 2/9 06/09/2017 03/04/2017 01/17/2017 01/07/2017 04/12/2016  Decreased Interest 3 0 0 0 1  Down, Depressed, Hopeless 3 1 1 1 2   PHQ - 2 Score 6 1 1 1 3   Altered sleeping 3 - - - 3  Tired, decreased energy 3 - - - 3  Change in appetite 3 - - - 3  Feeling bad or failure about yourself  3 - - - 2  Trouble concentrating 3 - - - 0  Moving slowly or fidgety/restless 2 - - - 0  Suicidal thoughts 1 - - - 0  PHQ-9 Score 24 - - - 14     Relevant past medical, surgical, family and social history reviewed and updated as indicated. Interim medical history since our last visit reviewed. Allergies and medications reviewed and updated.  Review of Systems  Constitutional: Negative for chills and fever.  Respiratory: Negative for  shortness of breath and wheezing.   Cardiovascular: Negative for chest pain and leg swelling.  Musculoskeletal: Negative for back pain and gait problem.  Skin: Negative for rash.  Psychiatric/Behavioral: Positive for dysphoric mood. Negative for decreased concentration, self-injury, sleep disturbance and suicidal ideas. The patient is nervous/anxious. The patient is not hyperactive.   All other systems reviewed and are negative.   Per HPI unless specifically indicated above        Objective:    BP 129/75   Pulse 63   Temp (!) 97 F (36.1 C) (Oral)   Ht 6\' 6"  (1.981 m)   Wt (!) 438 lb (198.7 kg)   BMI 50.62 kg/m   Wt Readings from Last 3 Encounters:  06/09/17 (!) 438 lb (198.7 kg)  03/04/17 (!) 425 lb (192.8 kg)  02/15/17 (!) 424 lb (192.3 kg)    Physical Exam  Constitutional: He is oriented to person, place, and time. He appears well-developed and well-nourished. No distress.  Eyes: Conjunctivae are normal. No scleral icterus.  Neck: Neck supple. No thyromegaly present.  Cardiovascular: Normal rate, regular rhythm, normal heart sounds and intact distal pulses.  No murmur heard. Pulmonary/Chest: Effort normal and  breath sounds normal. No respiratory distress. He has no wheezes. He has no rales.  Musculoskeletal: Normal range of motion. He exhibits no edema.  Lymphadenopathy:    He has no cervical adenopathy.  Neurological: He is alert and oriented to person, place, and time. Coordination normal.  Skin: Skin is warm and dry. No rash noted. He is not diaphoretic.  Psychiatric: He has a normal mood and affect. His behavior is normal.  Nursing note and vitals reviewed.   Description   Increase warfarin to 5 mg every day  INR today 1.7 (goal 2-3)         Assessment & Plan:   Problem List Items Addressed This Visit      Cardiovascular and Mediastinum   Paroxysmal atrial fibrillation (HCC)   Atrial flutter with controlled response (HCC)   Atypical atrial flutter  (HCC)     Other   Long term current use of anticoagulant therapy   Depression, recurrent (HCC) - Primary   Relevant Medications   buPROPion (WELLBUTRIN XL) 150 MG 24 hr tablet     Follow up plan: Return in about 4 weeks (around 07/07/2017), or if symptoms worsen or fail to improve, for INR recheck.  Counseling provided for all of the vaccine components No orders of the defined types were placed in this encounter.   Arville Care, MD [facility] 06/09/2017, 8:52 AM

## 2017-06-09 NOTE — Patient Instructions (Signed)
Description   Increase warfarin to 5 mg every day  INR today 1.7 (goal 2-3)

## 2017-06-10 ENCOUNTER — Encounter: Payer: Self-pay | Admitting: Cardiology

## 2017-06-10 NOTE — Progress Notes (Signed)
Remote ICD transmission.   

## 2017-06-14 DIAGNOSIS — R05 Cough: Secondary | ICD-10-CM | POA: Diagnosis not present

## 2017-06-14 DIAGNOSIS — J40 Bronchitis, not specified as acute or chronic: Secondary | ICD-10-CM | POA: Diagnosis not present

## 2017-06-14 DIAGNOSIS — J0100 Acute maxillary sinusitis, unspecified: Secondary | ICD-10-CM | POA: Diagnosis not present

## 2017-06-15 LAB — CUP PACEART REMOTE DEVICE CHECK
Date Time Interrogation Session: 20190206141026
Implantable Lead Implant Date: 20111010
Implantable Lead Location: 753859
Implantable Lead Location: 753860
Implantable Pulse Generator Implant Date: 20111010
MDC IDC LEAD IMPLANT DT: 20111010
Pulse Gen Serial Number: 621444

## 2017-06-28 ENCOUNTER — Ambulatory Visit (INDEPENDENT_AMBULATORY_CARE_PROVIDER_SITE_OTHER): Payer: Medicare Other | Admitting: Family Medicine

## 2017-06-28 ENCOUNTER — Encounter: Payer: Self-pay | Admitting: Family Medicine

## 2017-06-28 ENCOUNTER — Ambulatory Visit: Payer: Medicare Other | Admitting: Family Medicine

## 2017-06-28 VITALS — BP 107/67 | HR 64 | Temp 97.3°F | Ht 77.0 in | Wt >= 6400 oz

## 2017-06-28 DIAGNOSIS — J0100 Acute maxillary sinusitis, unspecified: Secondary | ICD-10-CM | POA: Diagnosis not present

## 2017-06-28 MED ORDER — CEFDINIR 300 MG PO CAPS: 300.0000 mg | ORAL_CAPSULE | Freq: Two times a day (BID) | ORAL | 0 refills | Status: DC

## 2017-06-28 NOTE — Patient Instructions (Addendum)
I prescribed the Omnicef to take twice a day for the next 10 days.  This should not interfere with your current medications.  If your symptoms persist or become more severe, please seek immediate medical attention.  I do recommend that you consider sinus rinses.  Perform these prior to using your nasal spray.   Sinus Rinse What is a sinus rinse? A sinus rinse is a home treatment. It rinses your sinuses with a mixture of salt and water (saline solution). Sinuses are air-filled spaces in your skull behind the bones of your face and forehead. They open into your nasal cavity. To do a sinus rinse, you will need:  Saline solution.  Neti pot or spray bottle. This releases the saline solution into your nose and through your sinuses. You can buy neti pots and spray bottles at: ? [employer]. ? A health food store. ? Online.  When should I do a sinus rinse? A sinus rinse can help to clear your nasal cavity. It can clear:  Mucus.  Dirt.  Dust.  Pollen.  You may do a sinus rinse when you have:  A cold.  A virus.  Allergies.  A sinus infection.  A stuffy nose.  If you are considering a sinus rinse:  Ask your child's doctor before doing a sinus rinse on your child.  Do not do a sinus rinse if you have had: ? Ear or nasal surgery. ? An ear infection. ? Blocked ears.  How do I do a sinus rinse?  Wash your hands.  Disinfect your device using the directions that came with the device.  Dry your device.  Use the solution that comes with your device or one that is sold separately in stores. Follow the mixing directions on the package.  Fill your device with the amount of saline solution as stated in the device instructions.  Stand over a sink and tilt your head sideways over the sink.  Place the spout of the device in your upper nostril (the one closer to the ceiling).  Gently pour or squeeze the saline solution into the nasal cavity. The liquid should drain to the  lower nostril if you are not too congested.  Gently blow your nose. Blowing too hard may cause ear pain.  Repeat in the other nostril.  Clean and rinse your device with clean water.  Air-dry your device. Are there risks of a sinus rinse? Sinus rinse is normally very safe and helpful. However, there are a few risks, which include:  A burning feeling in the sinuses. This may happen if you do not make the saline solution as instructed. Make sure to follow all directions when making the saline solution.  Infection from unclean water. This is rare, but possible.  Nasal irritation.  This information is not intended to replace advice given to you by your health care provider. Make sure you discuss any questions you have with your health care provider. Document Released: 11/21/2013 Document Revised: 03/23/2016 Document Reviewed: 09/11/2013 Elsevier Interactive Patient Education  2017 Elsevier Inc.    Sinusitis, Adult Sinusitis is soreness and inflammation of your sinuses. Sinuses are hollow spaces in the bones around your face. They are located:  Around your eyes.  In the middle of your forehead.  Behind your nose.  In your cheekbones.  Your sinuses and nasal passages are lined with a stringy fluid (mucus). Mucus normally drains out of your sinuses. When your nasal tissues get inflamed or swollen, the mucus can get  trapped or blocked so air cannot flow through your sinuses. This lets bacteria, viruses, and funguses grow, and that leads to infection. Follow these instructions at home: Medicines  Take, use, or apply over-the-counter and prescription medicines only as told by your doctor. These may include nasal sprays.  If you were prescribed an antibiotic medicine, take it as told by your doctor. Do not stop taking the antibiotic even if you start to feel better. Hydrate and Humidify  Drink enough water to keep your pee (urine) clear or pale yellow.  Use a cool mist humidifier  to keep the humidity level in your home above 50%.  Breathe in steam for 10-15 minutes, 3-4 times a day or as told by your doctor. You can do this in the bathroom while a hot shower is running.  Try not to spend time in cool or dry air. Rest  Rest as much as possible.  Sleep with your head raised (elevated).  Make sure to get enough sleep each night. General instructions  Put a warm, moist washcloth on your face 3-4 times a day or as told by your doctor. This will help with discomfort.  Wash your hands often with soap and water. If there is no soap and water, use hand sanitizer.  Do not smoke. Avoid being around people who are smoking (secondhand smoke).  Keep all follow-up visits as told by your doctor. This is important. Contact a doctor if:  You have a fever.  Your symptoms get worse.  Your symptoms do not get better within 10 days. Get help right away if:  You have a very bad headache.  You cannot stop throwing up (vomiting).  You have pain or swelling around your face or eyes.  You have trouble seeing.  You feel confused.  Your neck is stiff.  You have trouble breathing. This information is not intended to replace advice given to you by your health care provider. Make sure you discuss any questions you have with your health care provider. Document Released: 10/13/2007 Document Revised: 12/21/2015 Document Reviewed: 02/19/2015 Elsevier Interactive Patient Education  [employer].

## 2017-06-28 NOTE — Progress Notes (Signed)
Subjective: CC: sinus symptoms PCP: Danny Hinton, Danny Radon, MD ZOX:[id number] Danny Hinton is a 37 y.o. male presenting to clinic today for:  1. Cold symptoms  Patient reports persistent cough, sinus drainage, facial pain and fullness that started over 2 weeks ago.  He was actually seen in urgent care and prescribed amoxicillin 875 p.o. twice daily and prednisone Dosepak, which she completed on Friday.  He notes his symptoms had improved with these therapies but started worsening again over the weekend.  Denies hemoptysis, worsening SOB, dizziness, rash, nausea, vomiting, diarrhea, fevers, chills, myalgia, sick contacts, recent travel.    Denies history of COPD or asthma.  Denies tobacco use/ exposure.  Past medical history is significant for atrial fibrillation and congestive heart failure.  He is on anticoagulation and heart failure medications for this.   ROS: Per HPI  No Known Allergies Past Medical History:  Diagnosis Date  . Allergy   . Anxiety   . Atypical atrial flutter (HCC) 08/19/14   pace terminated in device clinic.  AFL cycle length was 390 msec  . Automatic implantable cardioverter-defibrillator in situ    a. St Jude in 2011.  Marland Kitchen Chronic anticoagulation   . Chronic systolic congestive heart failure (HCC)    a. suspected NICM - dx 2010. EF 15% by TEE, 10-20% by echo at that time. b. s/p St. Jude AICD 2011. c. Echo 01/2010: mod dilated LV, EF 30%, mod aortic root dilitation, no significant valvular disease.  . Eczema   . Eczema   . Hypertension   . Mobitz type 2 second degree atrioventricular block    a. During sleeping hours in 2010 suspected due to ? OSA.  . Morbid obesity (HCC)   . Nonischemic cardiomyopathy (HCC)   . PAF (paroxysmal atrial fibrillation) (HCC)   . Paroxysmal atrial flutter (HCC)    s/p ablation    Current Outpatient Medications:  .  atorvastatin (LIPITOR) 40 MG tablet, Take 1 tablet (40 mg total) by mouth daily with breakfast., Disp: 90 tablet, Rfl: 3 .   buPROPion (WELLBUTRIN XL) 150 MG 24 hr tablet, Take 1 tablet (150 mg total) by mouth daily., Disp: 90 tablet, Rfl: 1 .  carvedilol (COREG) 25 MG tablet, Take 1.5 tablets (37.5 mg total) by mouth 2 (two) times daily with a meal., Disp: 270 tablet, Rfl: 3 .  citalopram (CELEXA) 20 MG tablet, Take 1 tablet (20 mg total) by mouth daily., Disp: 90 tablet, Rfl: 2 .  cyclobenzaprine (FLEXERIL) 10 MG tablet, Take 1 tablet (10 mg total) by mouth 3 (three) times daily as needed for muscle spasms., Disp: 90 tablet, Rfl: 0 .  digoxin (LANOXIN) 0.125 MG tablet, Take 1 tablet (125 mcg total) by mouth every other day., Disp: 45 tablet, Rfl: 2 .  fluticasone (FLONASE) 50 MCG/ACT nasal spray, Place 1 spray into both nostrils as needed., Disp: 16 g, Rfl: 11 .  furosemide (LASIX) 40 MG tablet, TAKE ONE TABLET BY MOUTH ONCE DAILY AS NEEDED FOR FLUID, Disp: 30 tablet, Rfl: 3 .  sacubitril-valsartan (ENTRESTO) 49-51 MG, Take 1 tablet by mouth 2 (two) times daily., Disp: 180 tablet, Rfl: 3 .  spironolactone (ALDACTONE) 25 MG tablet, Take 1 tablet (25 mg total) by mouth daily., Disp: 90 tablet, Rfl: 3 .  traMADol (ULTRAM) 50 MG tablet, Take 1 tablet (50 mg total) by mouth every 6 (six) hours as needed for moderate pain., Disp: 30 tablet, Rfl: 0 .  triamcinolone cream (KENALOG) 0.1 %, Apply topically 2 (two) times daily  as needed., Disp: 453.6 g, Rfl: 2 .  warfarin (COUMADIN) 5 MG tablet, Take 1 tablet daily except take 1 and 1/2 tablets each Friday or as directed by anticoagulation clinic, Disp: 100 tablet, Rfl: 3 Social History   Socioeconomic History  . Marital status: Married    Spouse name: Not on file  . Number of children: Not on file  . Years of education: Not on file  . Highest education level: Not on file  Social Needs  . Financial resource strain: Not on file  . Food insecurity - worry: Not on file  . Food insecurity - inability: Not on file  . Transportation needs - medical: Not on file  . Transportation  needs - non-medical: Not on file  Occupational History  . Not on file  Tobacco Use  . Smoking status: Never Smoker  . Smokeless tobacco: Never Used  Substance and Sexual Activity  . Alcohol use: No  . Drug use: No  . Sexual activity: Yes  Other Topics Concern  . Not on file  Social History Narrative  . Not on file   Family History  Problem Relation Age of Onset  . Cancer Brother 18       hodgekins lymphoma  . Heart disease Mother        artial flutter  . Heart disease Maternal Grandfather   . Diabetes Paternal Grandmother     Objective: Office vital signs reviewed. BP 107/67   Pulse 64   Temp (!) 97.3 F (36.3 C) (Oral)   Ht 6\' 5"  (1.956 m)   Wt (!) 439 lb (199.1 kg)   SpO2 97%   BMI 52.06 kg/m   Physical Examination:  General: Awake, alert, obese, No acute distress HEENT: Normal    Neck: No masses palpated. No lymphadenopathy    Ears: Tympanic membranes intact, normal light reflex, no erythema, no bulging    Eyes: PERRLA, extraocular membranes intact, sclera white    Nose: nasal turbinates moist, edematous, erythematous, scant nasal discharge    Throat: moist mucus membranes, no erythema, no tonsillar exudate.  Airway is patent Cardio: seemingly regular rate and rhythm, S1S2 heard, no murmurs appreciated Pulm: clear to auscultation bilaterally, no wheezes, rhonchi or rales; normal work of breathing on room air  Assessment/ Plan: 37 y.o. male   1. Subacute maxillary sinusitis Given persistent symptoms, will treat with another round of antibiotics.  In lieu of repeating amoxicillin, I have elected to start Omnicef 300 mg p.o. twice daily for the next 10 days.  Home care instructions reviewed with patient.  I recommended that he consider sinus rinses.  May continue nasal Flonase. Strict return precautions and reasons for emergent evaluation in the emergency department review with patient.  They voiced understanding and will follow-up as needed.   Meds ordered this  encounter  Medications  . cefdinir (OMNICEF) 300 MG capsule    Sig: Take 1 capsule (300 mg total) by mouth 2 (two) times daily. 1 po BID    Dispense:  20 capsule    Refill:  0     Danny Senger Hulen Skains, DO [facility] [phone]

## 2017-07-08 ENCOUNTER — Ambulatory Visit (INDEPENDENT_AMBULATORY_CARE_PROVIDER_SITE_OTHER): Payer: Medicare Other | Admitting: Family Medicine

## 2017-07-08 ENCOUNTER — Encounter: Payer: Self-pay | Admitting: Family Medicine

## 2017-07-08 VITALS — BP 118/74 | HR 63 | Temp 98.0°F | Ht 77.0 in | Wt >= 6400 oz

## 2017-07-08 DIAGNOSIS — E782 Mixed hyperlipidemia: Secondary | ICD-10-CM

## 2017-07-08 DIAGNOSIS — Z7901 Long term (current) use of anticoagulants: Secondary | ICD-10-CM | POA: Diagnosis not present

## 2017-07-08 DIAGNOSIS — I10 Essential (primary) hypertension: Secondary | ICD-10-CM | POA: Diagnosis not present

## 2017-07-08 DIAGNOSIS — Z6841 Body Mass Index (BMI) 40.0 and over, adult: Secondary | ICD-10-CM | POA: Diagnosis not present

## 2017-07-08 DIAGNOSIS — F339 Major depressive disorder, recurrent, unspecified: Secondary | ICD-10-CM | POA: Diagnosis not present

## 2017-07-08 DIAGNOSIS — I480 Paroxysmal atrial fibrillation: Secondary | ICD-10-CM | POA: Diagnosis not present

## 2017-07-08 LAB — COAGUCHEK XS/INR WAIVED
INR: 2.9 — ABNORMAL HIGH (ref 0.9–1.1)
Prothrombin Time: 34.6 s

## 2017-07-08 NOTE — Patient Instructions (Addendum)
Description   Continue taking 1.5 tablets or 7.5 mg on Monday Wednesday Friday and 1 tablet for 5 mg the rest of the week  INR today 2.9 (goal 2-3)

## 2017-07-08 NOTE — Progress Notes (Signed)
BP 118/74   Pulse 63   Temp 98 F (36.7 C) (Oral)   Ht '6\' 5"'  (1.956 m)   Wt (!) 431 lb (195.5 kg)   BMI 51.11 kg/m    Subjective:    Patient ID: Danny Hinton, male    DOB: 06/25/1980, 37 y.o.   MRN: [id number]  HPI: Danny E Llerenas is a 37 y.o. male presenting on 07/08/2017 for Anticoagulation   HPI Hypertension Patient is currently on digoxin and Entresto and spironolactone and carvedilol, and their blood pressure today is 118/74. Patient denies any lightheadedness or dizziness. Patient denies headaches, blurred vision, chest pains, shortness of breath, or weakness. Denies any side effects from medication and is content with current medication.   Hyperlipidemia Patient is coming in for recheck of his hyperlipidemia. The patient is currently taking Lipitor. They deny any issues with myalgias or history of liver damage from it. They deny any focal numbness or weakness or chest pain.   Depression Patient is coming in to discuss depression and anxiety and has currently been on Wellbutrin and Celexa.  Patient is happy and content says the medication is doing very well for him he denies any major suicidal ideations.  He does not want to change his medication currently Depression screen [facility] 2/9 07/08/2017 06/09/2017 03/04/2017 01/17/2017 01/07/2017  Decreased Interest 2 3 0 0 0  Down, Depressed, Hopeless '3 3 1 1 1  ' PHQ - 2 Score '5 6 1 1 1  ' Altered sleeping 3 3 - - -  Tired, decreased energy 3 3 - - -  Change in appetite 3 3 - - -  Feeling bad or failure about yourself  3 3 - - -  Trouble concentrating 3 3 - - -  Moving slowly or fidgety/restless 2 2 - - -  Suicidal thoughts 0 1 - - -  PHQ-9 Score 22 24 - - -    A. fib/Coumadin recheck Patient is coming in for A. fib/Coumadin recheck.  He denies any major bleeding episodes or blood in his stool or dark tarry stools.  His INR today is 2.9 and will continue on current doses.  Relevant past medical, surgical, family and social history reviewed  and updated as indicated. Interim medical history since our last visit reviewed. Allergies and medications reviewed and updated.  Review of Systems  Constitutional: Negative for chills and fever.  Eyes: Negative for discharge.  Respiratory: Negative for shortness of breath and wheezing.   Cardiovascular: Negative for chest pain and leg swelling.  Musculoskeletal: Negative for back pain and gait problem.  Skin: Negative for rash.  Neurological: Negative for dizziness, light-headedness and headaches.  Psychiatric/Behavioral: Positive for dysphoric mood. Negative for decreased concentration, self-injury, sleep disturbance and suicidal ideas. The patient is nervous/anxious.   All other systems reviewed and are negative.   Per HPI unless specifically indicated above   Allergies as of 07/08/2017   No Known Allergies     Medication List        Accurate as of 07/08/17  8:42 AM. Always use your most recent med list.          atorvastatin 40 MG tablet Commonly known as:  LIPITOR Take 1 tablet (40 mg total) by mouth daily with breakfast.   buPROPion 150 MG 24 hr tablet Commonly known as:  WELLBUTRIN XL Take 1 tablet (150 mg total) by mouth daily.   carvedilol 25 MG tablet Commonly known as:  COREG Take 1.5 tablets (37.5 mg total) by  mouth 2 (two) times daily with a meal.   citalopram 20 MG tablet Commonly known as:  CELEXA Take 1 tablet (20 mg total) by mouth daily.   cyclobenzaprine 10 MG tablet Commonly known as:  FLEXERIL Take 1 tablet (10 mg total) by mouth 3 (three) times daily as needed for muscle spasms.   digoxin 0.125 MG tablet Commonly known as:  LANOXIN Take 1 tablet (125 mcg total) by mouth every other day.   fluticasone 50 MCG/ACT nasal spray Commonly known as:  FLONASE Place 1 spray into both nostrils as needed.   furosemide 40 MG tablet Commonly known as:  LASIX TAKE ONE TABLET BY MOUTH ONCE DAILY AS NEEDED FOR FLUID   sacubitril-valsartan 49-51  MG Commonly known as:  ENTRESTO Take 1 tablet by mouth 2 (two) times daily.   spironolactone 25 MG tablet Commonly known as:  ALDACTONE Take 1 tablet (25 mg total) by mouth daily.   traMADol 50 MG tablet Commonly known as:  ULTRAM Take 1 tablet (50 mg total) by mouth every 6 (six) hours as needed for moderate pain.   triamcinolone cream 0.1 % Commonly known as:  KENALOG Apply topically 2 (two) times daily as needed.   warfarin 5 MG tablet Commonly known as:  COUMADIN Take as directed by the anticoagulation clinic. If you are unsure how to take this medication, talk to your nurse or doctor. Original instructions:  Take 1 tablet daily except take 1 and 1/2 tablets each Friday or as directed by anticoagulation clinic          Objective:    BP 118/74   Pulse 63   Temp 98 F (36.7 C) (Oral)   Ht '6\' 5"'  (1.956 m)   Wt (!) 431 lb (195.5 kg)   BMI 51.11 kg/m   Wt Readings from Last 3 Encounters:  07/08/17 (!) 431 lb (195.5 kg)  06/28/17 (!) 439 lb (199.1 kg)  06/09/17 (!) 438 lb (198.7 kg)    Physical Exam  Constitutional: He is oriented to person, place, and time. He appears well-developed and well-nourished. No distress.  Eyes: Conjunctivae are normal. No scleral icterus.  Neck: Neck supple. No thyromegaly present.  Cardiovascular: Normal rate, regular rhythm, normal heart sounds and intact distal pulses.  No murmur heard. Pulmonary/Chest: Effort normal and breath sounds normal. No respiratory distress. He has no wheezes.  Musculoskeletal: Normal range of motion. He exhibits no edema.  Lymphadenopathy:    He has no cervical adenopathy.  Neurological: He is alert and oriented to person, place, and time. Coordination normal.  Skin: Skin is warm and dry. No rash noted. He is not diaphoretic.  Psychiatric: His behavior is normal. Judgment normal. His mood appears anxious. He exhibits a depressed mood. He expresses no suicidal ideation. He expresses no suicidal plans.   Nursing note and vitals reviewed.   Description   Continue taking 1.5 tablets or 7.5 mg on Monday Wednesday Friday and 1 tablet for 5 mg the rest of the week  INR today 2.9 (goal 2-3)        Assessment & Plan:   Problem List Items Addressed This Visit      Cardiovascular and Mediastinum   Paroxysmal atrial fibrillation (HCC) - Primary   Relevant Orders   CoaguChek XS/INR Waived   CBC with Differential/Platelet (Completed)   Hypertension   Relevant Orders   CMP14+EGFR (Completed)     Other   Long term current use of anticoagulant therapy   Hyperlipidemia   Relevant  Orders   Lipid panel (Completed)   Class 3 severe obesity due to excess calories with body mass index (BMI) of 50.0 to 59.9 in adult [facility])   Depression, recurrent ([place])   Relevant Orders   CBC with Differential/Platelet (Completed)       Follow up plan: Return in about 4 weeks (around 08/05/2017), or if symptoms worsen or fail to improve, for INR/Coumadin recheck.  Counseling provided for all of the vaccine components Orders Placed This Encounter  Procedures  . CoaguChek XS/INR Waived  . CMP14+EGFR  . Lipid panel  . CBC with Differential/Platelet    Caryl Pina, MD [facility] 07/08/2017, 8:42 AM

## 2017-07-09 LAB — LIPID PANEL
CHOL/HDL RATIO: 3.3 ratio (ref 0.0–5.0)
CHOLESTEROL TOTAL: 112 mg/dL (ref 100–199)
HDL: 34 mg/dL — AB (ref >39)
LDL CALC: 65 mg/dL (ref 0–99)
TRIGLYCERIDES: 64 mg/dL (ref 0–149)
VLDL CHOLESTEROL CAL: 13 mg/dL (ref 5–40)

## 2017-07-09 LAB — CBC WITH DIFFERENTIAL/PLATELET
BASOS ABS: 0 10*3/uL (ref 0.0–0.2)
BASOS: 1 %
EOS (ABSOLUTE): 0.2 10*3/uL (ref 0.0–0.4)
Eos: 6 %
HEMATOCRIT: 37.9 % (ref 37.5–51.0)
Hemoglobin: 12.2 g/dL — ABNORMAL LOW (ref 13.0–17.7)
Immature Grans (Abs): 0 10*3/uL (ref 0.0–0.1)
Immature Granulocytes: 0 %
LYMPHS ABS: 1.4 10*3/uL (ref 0.7–3.1)
Lymphs: 46 %
MCH: 27.6 pg (ref 26.6–33.0)
MCHC: 32.2 g/dL (ref 31.5–35.7)
MCV: 86 fL (ref 79–97)
MONOS ABS: 0.4 10*3/uL (ref 0.1–0.9)
Monocytes: 13 %
NEUTROS ABS: 1 10*3/uL — AB (ref 1.4–7.0)
Neutrophils: 34 %
PLATELETS: 231 10*3/uL (ref 150–379)
RBC: 4.42 x10E6/uL (ref 4.14–5.80)
RDW: 14.8 % (ref 12.3–15.4)
WBC: 3 10*3/uL — ABNORMAL LOW (ref 3.4–10.8)

## 2017-07-09 LAB — CMP14+EGFR
ALT: 42 IU/L (ref 0–44)
AST: 27 IU/L (ref 0–40)
Albumin/Globulin Ratio: 1.4 (ref 1.2–2.2)
Albumin: 4.6 g/dL (ref 3.5–5.5)
Alkaline Phosphatase: 67 IU/L (ref 39–117)
BILIRUBIN TOTAL: 1 mg/dL (ref 0.0–1.2)
BUN/Creatinine Ratio: 13 (ref 9–20)
BUN: 13 mg/dL (ref 6–20)
CALCIUM: 9.3 mg/dL (ref 8.7–10.2)
CHLORIDE: 99 mmol/L (ref 96–106)
CO2: 21 mmol/L (ref 20–29)
Creatinine, Ser: 1.03 mg/dL (ref 0.76–1.27)
GFR, EST AFRICAN AMERICAN: 108 mL/min/{1.73_m2} (ref >59)
GFR, EST NON AFRICAN AMERICAN: 94 mL/min/{1.73_m2} (ref >59)
GLOBULIN, TOTAL: 3.2 g/dL (ref 1.5–4.5)
Glucose: 106 mg/dL — ABNORMAL HIGH (ref 65–99)
Potassium: 4.4 mmol/L (ref 3.5–5.2)
SODIUM: 137 mmol/L (ref 134–144)
TOTAL PROTEIN: 7.8 g/dL (ref 6.0–8.5)

## 2017-08-08 ENCOUNTER — Encounter: Payer: Self-pay | Admitting: Family Medicine

## 2017-08-08 ENCOUNTER — Ambulatory Visit (INDEPENDENT_AMBULATORY_CARE_PROVIDER_SITE_OTHER): Payer: Medicare Other | Admitting: Family Medicine

## 2017-08-08 VITALS — BP 122/76 | HR 67 | Temp 97.9°F | Ht 77.0 in | Wt >= 6400 oz

## 2017-08-08 DIAGNOSIS — Z7901 Long term (current) use of anticoagulants: Secondary | ICD-10-CM | POA: Diagnosis not present

## 2017-08-08 DIAGNOSIS — I484 Atypical atrial flutter: Secondary | ICD-10-CM | POA: Diagnosis not present

## 2017-08-08 DIAGNOSIS — I480 Paroxysmal atrial fibrillation: Secondary | ICD-10-CM | POA: Diagnosis not present

## 2017-08-08 DIAGNOSIS — I428 Other cardiomyopathies: Secondary | ICD-10-CM | POA: Diagnosis not present

## 2017-08-08 DIAGNOSIS — I4892 Unspecified atrial flutter: Secondary | ICD-10-CM

## 2017-08-08 LAB — COAGUCHEK XS/INR WAIVED
INR: 3 — AB (ref 0.9–1.1)
Prothrombin Time: 36.1 s

## 2017-08-08 NOTE — Progress Notes (Signed)
BP 122/76   Pulse 67   Temp 97.9 F (36.6 C) (Oral)   Ht 6\' 5"  (1.956 m)   Wt (!) 444 lb (201.4 kg)   BMI 52.65 kg/m    Subjective:    Patient ID: Danny Hinton, male    DOB: 10/27/1980, 37 y.o.   MRN: [id number]  HPI: Danny Hinton is a 37 y.o. male presenting on 08/08/2017 for Anticoagulation (4 week recheck)   HPI A. fib and a flutter, Coumadin recheck Patient is coming in for Coumadin recheck today.  He denies any chest pain or palpitations or flutters and they do monitor his pacemaker regularly.  His INR is 3.0 which is right at the edge.  He denies any bleeding episodes.  We will maintain his dose currently and will monitor in 1 month again  Patient is having a lot of stress right now with an upcoming disability hearing, they are trying to take away his disability.  He has a Clinical research associate that is helping him with this.  Relevant past medical, surgical, family and social history reviewed and updated as indicated. Interim medical history since our last visit reviewed. Allergies and medications reviewed and updated.  Review of Systems  Constitutional: Negative for chills and fever.  Respiratory: Negative for shortness of breath and wheezing.   Cardiovascular: Negative for chest pain, palpitations and leg swelling.  Gastrointestinal: Negative for blood in stool.  Genitourinary: Negative for hematuria.  Musculoskeletal: Negative for back pain and gait problem.  Skin: Negative for rash.  Neurological: Negative for dizziness, weakness and numbness.  All other systems reviewed and are negative.   Per HPI unless specifically indicated above   Allergies as of 08/08/2017   No Known Allergies     Medication List        Accurate as of 08/08/17  8:29 AM. Always use your most recent med list.          atorvastatin 40 MG tablet Commonly known as:  LIPITOR Take 1 tablet (40 mg total) by mouth daily with breakfast.   buPROPion 150 MG 24 hr tablet Commonly known as:  WELLBUTRIN XL Take  1 tablet (150 mg total) by mouth daily.   carvedilol 25 MG tablet Commonly known as:  COREG Take 1.5 tablets (37.5 mg total) by mouth 2 (two) times daily with a meal.   citalopram 20 MG tablet Commonly known as:  CELEXA Take 1 tablet (20 mg total) by mouth daily.   cyclobenzaprine 10 MG tablet Commonly known as:  FLEXERIL Take 1 tablet (10 mg total) by mouth 3 (three) times daily as needed for muscle spasms.   digoxin 0.125 MG tablet Commonly known as:  LANOXIN Take 1 tablet (125 mcg total) by mouth every other day.   fluticasone 50 MCG/ACT nasal spray Commonly known as:  FLONASE Place 1 spray into both nostrils as needed.   furosemide 40 MG tablet Commonly known as:  LASIX TAKE ONE TABLET BY MOUTH ONCE DAILY AS NEEDED FOR FLUID   sacubitril-valsartan 49-51 MG Commonly known as:  ENTRESTO Take 1 tablet by mouth 2 (two) times daily.   spironolactone 25 MG tablet Commonly known as:  ALDACTONE Take 1 tablet (25 mg total) by mouth daily.   traMADol 50 MG tablet Commonly known as:  ULTRAM Take 1 tablet (50 mg total) by mouth every 6 (six) hours as needed for moderate pain.   triamcinolone cream 0.1 % Commonly known as:  KENALOG Apply topically 2 (two) times daily as  needed.   warfarin 5 MG tablet Commonly known as:  COUMADIN Take as directed by the anticoagulation clinic. If you are unsure how to take this medication, talk to your nurse or doctor. Original instructions:  Take 1 tablet daily except take 1 and 1/2 tablets each Friday or as directed by anticoagulation clinic          Objective:    BP 122/76   Pulse 67   Temp 97.9 F (36.6 C) (Oral)   Ht 6\' 5"  (1.956 m)   Wt (!) 444 lb (201.4 kg)   BMI 52.65 kg/m   Wt Readings from Last 3 Encounters:  08/08/17 (!) 444 lb (201.4 kg)  07/08/17 (!) 431 lb (195.5 kg)  06/28/17 (!) 439 lb (199.1 kg)    Physical Exam  Constitutional: He is oriented to person, place, and time. He appears well-developed and  well-nourished. No distress.  Eyes: Conjunctivae are normal. No scleral icterus.  Cardiovascular: Normal rate, regular rhythm, normal heart sounds and intact distal pulses.  No murmur heard. Pulmonary/Chest: Effort normal and breath sounds normal. No respiratory distress. He has no wheezes. He has no rales.  Musculoskeletal: Normal range of motion. He exhibits no edema.  Neurological: He is alert and oriented to person, place, and time. Coordination normal.  Skin: Skin is warm and dry. No rash noted. He is not diaphoretic.  Psychiatric: His behavior is normal. His mood appears anxious. He exhibits a depressed mood. He expresses no suicidal ideation. He expresses no suicidal plans.  Nursing note and vitals reviewed.  Description   Continue taking 1.5 tablets or 7.5 mg on Monday Wednesday Friday and 1 tablet for 5 mg the rest of the week  INR today 3.0 (goal 2-3)         Assessment & Plan:   Problem List Items Addressed This Visit      Cardiovascular and Mediastinum   Paroxysmal atrial fibrillation (HCC) - Primary   Non-ischemic cardiomyopathy (HCC)   Atrial flutter with controlled response (HCC)   Atypical atrial flutter (HCC)     Other   Long term current use of anticoagulant therapy       Follow up plan: Return in about 1 month (around 09/05/2017), or if symptoms worsen or fail to improve.  Counseling provided for all of the vaccine components No orders of the defined types were placed in this encounter.   Arville Care, MD [facility] 08/08/2017, 8:29 AM

## 2017-08-08 NOTE — Patient Instructions (Signed)
Description   Continue taking 1.5 tablets or 7.5 mg on Monday Wednesday Friday and 1 tablet for 5 mg the rest of the week  INR today 3.0 (goal 2-3)

## 2017-08-29 ENCOUNTER — Other Ambulatory Visit: Payer: Self-pay | Admitting: Family Medicine

## 2017-08-29 DIAGNOSIS — F32A Depression, unspecified: Secondary | ICD-10-CM

## 2017-08-29 DIAGNOSIS — F329 Major depressive disorder, single episode, unspecified: Secondary | ICD-10-CM

## 2017-08-29 DIAGNOSIS — F419 Anxiety disorder, unspecified: Principal | ICD-10-CM

## 2017-09-06 ENCOUNTER — Encounter: Payer: Self-pay | Admitting: Family Medicine

## 2017-09-06 ENCOUNTER — Ambulatory Visit (INDEPENDENT_AMBULATORY_CARE_PROVIDER_SITE_OTHER): Payer: Medicare Other | Admitting: Family Medicine

## 2017-09-06 VITALS — BP 121/79 | HR 71 | Temp 98.0°F | Ht 77.0 in | Wt >= 6400 oz

## 2017-09-06 DIAGNOSIS — I484 Atypical atrial flutter: Secondary | ICD-10-CM

## 2017-09-06 DIAGNOSIS — Z6841 Body Mass Index (BMI) 40.0 and over, adult: Secondary | ICD-10-CM | POA: Diagnosis not present

## 2017-09-06 DIAGNOSIS — Z9189 Other specified personal risk factors, not elsewhere classified: Secondary | ICD-10-CM | POA: Diagnosis not present

## 2017-09-06 DIAGNOSIS — I4892 Unspecified atrial flutter: Secondary | ICD-10-CM

## 2017-09-06 DIAGNOSIS — I480 Paroxysmal atrial fibrillation: Secondary | ICD-10-CM

## 2017-09-06 LAB — COAGUCHEK XS/INR WAIVED
INR: 2.7 — AB (ref 0.9–1.1)
Prothrombin Time: 31.9 s

## 2017-09-06 NOTE — Progress Notes (Signed)
BP 121/79   Pulse 71   Temp 98 F (36.7 C) (Oral)   Ht 6\' 5"  (1.956 m)   Wt (!) 439 lb (199.1 kg)   BMI 52.06 kg/m    Subjective:    Patient ID: Danny E Elias, male    DOB: 06/20/1980, 37 y.o.   MRN: [id number]  HPI: Danny Hinton is a 37 y.o. male presenting on 09/06/2017 for Anticoagulation   HPI Coumadin/A. fib recheck Patient is coming in for Coumadin recheck.  He says he still gets occasional flutters but has been trying to walk more to try in lose some weight which he feels has been helping some.  He has been having a lot more stress recently because of his disability hearing.  He denies any major bleeding.  He gets palpitations but denies any chest pain or shortness of breath.  We calculated his ASCVD risk score which was less than 5%.  Patient is continuing to work on his weight and increasing physical activity.  Relevant past medical, surgical, family and social history reviewed and updated as indicated. Interim medical history since our last visit reviewed. Allergies and medications reviewed and updated.  Review of Systems  Constitutional: Negative for chills and fever.  Respiratory: Negative for shortness of breath and wheezing.   Cardiovascular: Negative for chest pain and leg swelling.  Genitourinary: Negative for hematuria.  Musculoskeletal: Negative for back pain and gait problem.  Skin: Negative for rash.  All other systems reviewed and are negative.   Per HPI unless specifically indicated above   Allergies as of 09/06/2017   No Known Allergies     Medication List        Accurate as of 09/06/17  9:00 AM. Always use your most recent med list.          atorvastatin 40 MG tablet Commonly known as:  LIPITOR Take 1 tablet (40 mg total) by mouth daily with breakfast.   buPROPion 150 MG 24 hr tablet Commonly known as:  WELLBUTRIN XL Take 1 tablet (150 mg total) by mouth daily.   carvedilol 25 MG tablet Commonly known as:  COREG Take 1.5 tablets (37.5 mg  total) by mouth 2 (two) times daily with a meal.   citalopram 20 MG tablet Commonly known as:  CELEXA TAKE 1 TABLET BY MOUTH ONCE DAILY   cyclobenzaprine 10 MG tablet Commonly known as:  FLEXERIL Take 1 tablet (10 mg total) by mouth 3 (three) times daily as needed for muscle spasms.   digoxin 0.125 MG tablet Commonly known as:  LANOXIN Take 1 tablet (125 mcg total) by mouth every other day.   fluticasone 50 MCG/ACT nasal spray Commonly known as:  FLONASE Place 1 spray into both nostrils as needed.   furosemide 40 MG tablet Commonly known as:  LASIX TAKE ONE TABLET BY MOUTH ONCE DAILY AS NEEDED FOR FLUID   sacubitril-valsartan 49-51 MG Commonly known as:  ENTRESTO Take 1 tablet by mouth 2 (two) times daily.   spironolactone 25 MG tablet Commonly known as:  ALDACTONE Take 1 tablet (25 mg total) by mouth daily.   traMADol 50 MG tablet Commonly known as:  ULTRAM Take 1 tablet (50 mg total) by mouth every 6 (six) hours as needed for moderate pain.   triamcinolone cream 0.1 % Commonly known as:  KENALOG Apply topically 2 (two) times daily as needed.   warfarin 5 MG tablet Commonly known as:  COUMADIN Take as directed by the anticoagulation clinic. If you  are unsure how to take this medication, talk to your nurse or doctor. Original instructions:  Take 1 tablet daily except take 1 and 1/2 tablets each Friday or as directed by anticoagulation clinic          Objective:    BP 121/79   Pulse 71   Temp 98 F (36.7 C) (Oral)   Ht 6\' 5"  (1.956 m)   Wt (!) 439 lb (199.1 kg)   BMI 52.06 kg/m   Wt Readings from Last 3 Encounters:  09/06/17 (!) 439 lb (199.1 kg)  08/08/17 (!) 444 lb (201.4 kg)  07/08/17 (!) 431 lb (195.5 kg)    Physical Exam  Constitutional: He is oriented to person, place, and time. He appears well-developed and well-nourished. No distress.  Eyes: Pupils are equal, round, and reactive to light. Conjunctivae and EOM are normal. Right eye exhibits no  discharge. No scleral icterus.  Neck: Neck supple. No thyromegaly present.  Cardiovascular: Normal rate and intact distal pulses. An irregular rhythm present.  No murmur heard. Pulmonary/Chest: Effort normal and breath sounds normal. No respiratory distress. He has no wheezes.  Musculoskeletal: Normal range of motion. He exhibits no edema.  Lymphadenopathy:    He has no cervical adenopathy.  Neurological: He is alert and oriented to person, place, and time. Coordination normal.  Skin: Skin is warm and dry. No rash noted. He is not diaphoretic.  Psychiatric: He has a normal mood and affect. His behavior is normal.  Nursing note and vitals reviewed.   Results for orders placed or performed in visit on 08/08/17  CoaguChek XS/INR Waived  Result Value Ref Range   INR 3.0 (H) 0.9 - 1.1   Prothrombin Time 36.1 sec      Assessment & Plan:   Problem List Items Addressed This Visit      Cardiovascular and Mediastinum   Paroxysmal atrial fibrillation (HCC) - Primary   Atrial flutter with controlled response (HCC)   Atypical atrial flutter (HCC)     Other   Class 3 severe obesity due to excess calories with body mass index (BMI) of 50.0 to 59.9 in adult [facility])    Other Visit Diagnoses    10 year risk of MI or stroke < 7.5%          ASCVD risk of 4%  Follow up plan: Return in about 1 month (around 10/04/2017), or if symptoms worsen or fail to improve, for Coumadin recheck.  Counseling provided for all of the vaccine components No orders of the defined types were placed in this encounter.   Arville Care, MD [facility] 09/06/2017, 9:00 AM

## 2017-09-06 NOTE — Patient Instructions (Signed)
Description   Continue taking 1.5 tablets or 7.5 mg on Monday Wednesday Friday and 1 tablet for 5 mg the rest of the week  INR today 2.7 (goal 2-3)

## 2017-09-07 ENCOUNTER — Encounter: Payer: Medicare Other | Admitting: *Deleted

## 2017-09-07 ENCOUNTER — Telehealth: Payer: Self-pay | Admitting: Cardiology

## 2017-09-07 NOTE — Telephone Encounter (Signed)
Spoke with pt and reminded pt of remote transmission that is due today. Pt verbalized understanding.   

## 2017-09-08 ENCOUNTER — Encounter: Payer: Self-pay | Admitting: Cardiology

## 2017-09-14 ENCOUNTER — Ambulatory Visit (INDEPENDENT_AMBULATORY_CARE_PROVIDER_SITE_OTHER): Payer: Medicare Other | Admitting: *Deleted

## 2017-09-14 ENCOUNTER — Encounter: Payer: Self-pay | Admitting: *Deleted

## 2017-09-14 VITALS — BP 94/63 | HR 63 | Wt >= 6400 oz

## 2017-09-14 DIAGNOSIS — Z0000 Encounter for general adult medical examination without abnormal findings: Secondary | ICD-10-CM | POA: Diagnosis not present

## 2017-09-14 NOTE — Patient Instructions (Addendum)
Please work on your goal of losing weight- by decreasing carbohydrates, avoiding sodas, and increasing exercise.   Thank you for coming in for your Annual Wellness Visit today!    Preventive Care 18-39 Years, Male Preventive care refers to lifestyle choices and visits with your health care provider that can promote health and wellness. What does preventive care include?  A yearly physical exam. This is also called an annual well check.  Dental exams once or twice a year.  Routine eye exams. Ask your health care provider how often you should have your eyes checked.  Personal lifestyle choices, including: ? Daily care of your teeth and gums. ? Regular physical activity. ? Eating a healthy diet. ? Avoiding tobacco and drug use. ? Limiting alcohol use. ? Practicing safe sex. What happens during an annual well check? The services and screenings done by your health care provider during your annual well check will depend on your age, overall health, lifestyle risk factors, and family history of disease. Counseling Your health care provider may ask you questions about your:  Alcohol use.  Tobacco use.  Drug use.  Emotional well-being.  Home and relationship well-being.  Sexual activity.  Eating habits.  Work and work Statistician.  Screening You may have the following tests or measurements:  Height, weight, and BMI.  Blood pressure.  Lipid and cholesterol levels. These may be checked every 5 years starting at age 75.  Diabetes screening. This is done by checking your blood sugar (glucose) after you have not eaten for a while (fasting).  Skin check.  Hepatitis C blood test.  Hepatitis B blood test.  Sexually transmitted disease (STD) testing.  Discuss your test results, treatment options, and if necessary, the need for more tests with your health care provider. Vaccines Your health care provider may recommend certain vaccines, such as:  Influenza vaccine. This  is recommended every year.  Tetanus, diphtheria, and acellular pertussis (Tdap, Td) vaccine. You may need a Td booster every 10 years.  Varicella vaccine. You may need this if you have not been vaccinated.  HPV vaccine. If you are 24 or younger, you may need three doses over 6 months.  Measles, mumps, and rubella (MMR) vaccine. You may need at least one dose of MMR.You may also need a second dose.  Pneumococcal 13-valent conjugate (PCV13) vaccine. You may need this if you have certain conditions and have not been vaccinated.  Pneumococcal polysaccharide (PPSV23) vaccine. You may need one or two doses if you smoke cigarettes or if you have certain conditions.  Meningococcal vaccine. One dose is recommended if you are age 33-21 years and a first-year college student living in a residence hall, or if you have one of several medical conditions. You may also need additional booster doses.  Hepatitis A vaccine. You may need this if you have certain conditions or if you travel or work in places where you may be exposed to hepatitis A.  Hepatitis B vaccine. You may need this if you have certain conditions or if you travel or work in places where you may be exposed to hepatitis B.  Haemophilus influenzae type b (Hib) vaccine. You may need this if you have certain risk factors.  Talk to your health care provider about which screenings and vaccines you need and how often you need them. This information is not intended to replace advice given to you by your health care provider. Make sure you discuss any questions you have with your health care provider.  Document Released: 06/22/2001 Document Revised: 01/14/2016 Document Reviewed: 02/25/2015 Elsevier Interactive Patient Education  [employer].

## 2017-09-15 NOTE — Progress Notes (Signed)
Subjective:   Danny Hinton is a 37 y.o. male who presents for Medicare Annual/Subsequent preventive examination.  Mr. Medlen has permanent disability due to CHF, atrial flutter, atrial fibrillation and paroxysmal atrial tachycardia.  He also has an implanted defibrillator. He lives with his wife and 75 year old daughter.  Mr. Kunkle enjoys being outside, attending church, and spending time with friends and family.  He feels his health is better this year than it was last year because he is feeling better in general.  He reports no hospitalizations, ER visits, or surgeries in the past year.    Review of Systems:  HEENT- positive for congestion, cough All other systems negative        Objective:    Vitals: BP 94/63   Pulse 63   Wt (!) 444 lb (201.4 kg)   BMI 52.65 kg/m   Body mass index is 52.65 kg/m.  Advanced Directives 09/14/2017 02/10/2015 01/18/2014 12/27/2013 10/17/2012  Does Patient Have a Medical Advance Directive? No Yes No No Patient does not have advance directive;Patient would not like information  Type of Advance Directive - Healthcare Power of [place];Living will - - -  Does patient want to make changes to medical advance directive? - No - Patient declined - - -  Copy of Healthcare Power of Attorney in Chart? - No - copy requested - - -  Would patient like information on creating a medical advance directive? Yes (MAU/Ambulatory/Procedural Areas - Information given) - No - patient declined information No - patient declined information -    Tobacco Social History   Tobacco Use  Smoking Status Never Smoker  Smokeless Tobacco Never Used     Counseling given: No   Clinical Intake:     Pain Score: 0-No pain                 Past Medical History:  Diagnosis Date  . Allergy   . Anxiety   . Atypical atrial flutter (HCC) 08/19/14   pace terminated in device clinic.  AFL cycle length was 390 msec  . Automatic implantable cardioverter-defibrillator in situ    a. St  Jude in 2011.  Marland Kitchen Chronic anticoagulation   . Chronic systolic congestive heart failure (HCC)    a. suspected NICM - dx 2010. EF 15% by TEE, 10-20% by echo at that time. b. s/p St. Jude AICD 2011. c. Echo 01/2010: mod dilated LV, EF 30%, mod aortic root dilitation, no significant valvular disease.  . Eczema   . Eczema   . Hypertension   . Mobitz type 2 second degree atrioventricular block    a. During sleeping hours in 2010 suspected due to ? OSA.  . Morbid obesity (HCC)   . Nonischemic cardiomyopathy (HCC)   . PAF (paroxysmal atrial fibrillation) (HCC)   . Paroxysmal atrial flutter [facility])    s/p ablation   Past Surgical History:  Procedure Laterality Date  . ATRIAL FLUTTER ABLATION N/A 01/18/2014   Procedure: ATRIAL FLUTTER ABLATION;  Surgeon: Duke Salvia, MD;  Location: [facility] CATH LAB;  Service: Cardiovascular;  Laterality: N/A;  . CARDIAC DEFIBRILLATOR PLACEMENT  02/17/10   St. Jude Medical 45DR, model number [id number], serial number [id number]  . CARDIOVERSION N/A 12/29/2013   Procedure: CARDIOVERSION;  Surgeon: Duke Salvia, MD;  Location: [facility] OR;  Service: Cardiovascular;  Laterality: N/A;  . TOOTH EXTRACTION N/A 10/23/2012   Procedure: EXTRACTION TEETH 1, 16, 17, 30, 31;  Surgeon: [name], DDS;  Location: [facility]  OR;  Service: Oral Surgery;  Laterality: N/A;   Family History  Problem Relation Age of Onset  . Cancer Brother 18       hodgekins lymphoma  . Heart disease Mother        artial flutter  . Heart disease Maternal Grandfather   . Diabetes Paternal Grandmother    Social History   Socioeconomic History  . Marital status: Married    Spouse name: Not on file  . Number of children: Not on file  . Years of education: Not on file  . Highest education level: Not on file  Occupational History  . Not on file  Social Needs  . Financial resource strain: Not on file  . Food insecurity:    Worry: Not on file    Inability: Not on file  . Transportation needs:    Medical:  Not on file    Non-medical: Not on file  Tobacco Use  . Smoking status: Never Smoker  . Smokeless tobacco: Never Used  Substance and Sexual Activity  . Alcohol use: No  . Drug use: No  . Sexual activity: Yes  Lifestyle  . Physical activity:    Days per week: Not on file    Minutes per session: Not on file  . Stress: Not on file  Relationships  . Social connections:    Talks on phone: Not on file    Gets together: Not on file    Attends religious service: Not on file    Active member of club or organization: Not on file    Attends meetings of clubs or organizations: Not on file    Relationship status: Not on file  Other Topics Concern  . Not on file  Social History Narrative  . Not on file    Outpatient Encounter Medications as of 09/14/2017  Medication Sig  . atorvastatin (LIPITOR) 40 MG tablet Take 1 tablet (40 mg total) by mouth daily with breakfast.  . buPROPion (WELLBUTRIN XL) 150 MG 24 hr tablet Take 1 tablet (150 mg total) by mouth daily.  . carvedilol (COREG) 25 MG tablet Take 1.5 tablets (37.5 mg total) by mouth 2 (two) times daily with a meal.  . citalopram (CELEXA) 20 MG tablet TAKE 1 TABLET BY MOUTH ONCE DAILY  . cyclobenzaprine (FLEXERIL) 10 MG tablet Take 1 tablet (10 mg total) by mouth 3 (three) times daily as needed for muscle spasms.  . digoxin (LANOXIN) 0.125 MG tablet Take 1 tablet (125 mcg total) by mouth every other day.  . fluticasone (FLONASE) 50 MCG/ACT nasal spray Place 1 spray into both nostrils as needed.  . furosemide (LASIX) 40 MG tablet TAKE ONE TABLET BY MOUTH ONCE DAILY AS NEEDED FOR FLUID  . sacubitril-valsartan (ENTRESTO) 49-51 MG Take 1 tablet by mouth 2 (two) times daily.  Marland Kitchen spironolactone (ALDACTONE) 25 MG tablet Take 1 tablet (25 mg total) by mouth daily.  . traMADol (ULTRAM) 50 MG tablet Take 1 tablet (50 mg total) by mouth every 6 (six) hours as needed for moderate pain.  Marland Kitchen triamcinolone cream (KENALOG) 0.1 % Apply topically 2 (two) times  daily as needed.  . warfarin (COUMADIN) 5 MG tablet Take 1 tablet daily except take 1 and 1/2 tablets each Friday or as directed by anticoagulation clinic   No facility-administered encounter medications on file as of 09/14/2017.     Activities of Daily Living In your present state of health, do you have any difficulty performing the following activities: 09/14/2017  Hearing?  Y  Comment ringing in ears occasionally  Vision? N  Difficulty concentrating or making decisions? Y  Comment trouble concentrating and remembering  Walking or climbing stairs? Y  Comment due to shortness of breath  Dressing or bathing? N  Doing errands, shopping? N  Preparing Food and eating ? N  Using the Toilet? N  In the past six months, have you accidently leaked urine? N  Do you have problems with loss of bowel control? N  Managing your Medications? N  Managing your Finances? N  Housekeeping or managing your Housekeeping? Y  Comment Difficult to do yard work and inside house work due to shortness of breath  Some recent data might be hidden    Patient Care Team: Dettinger, Elige Radon, MD as PCP - General (Family Medicine) Croitoru, Rachelle Hora, MD as Consulting Physician (Cardiology) Hillis Range, MD as Consulting Physician (Cardiology)   Assessment:   This is a routine wellness examination for Danny.  Exercise Activities and Dietary recommendations Current Exercise Habits: The patient does not participate in regular exercise at present, Exercise limited by: cardiac condition(s)  Goals    . Weight (lb) < 300 lb (136.1 kg)     Cut out soda, decrease carbohydrates, increase exercise on treadmill.       Patient states he usually eats 2 meals per day and snacks.  He states when he feels down emotionally he eats unhealthy snacks and lays around.  He wants to change this habit to being more active and reducing carbohydrate intake.   Fall Risk Fall Risk  09/14/2017 12/12/2015 02/10/2015 12/10/2013 11/19/2013  Falls in  the past year? No No No No No   Is the patient's home free of loose throw rugs in walkways, pet beds, electrical cords, etc?   no      Grab bars in the bathroom? no      Handrails on the stairs?   No stairs in patient's home      Adequate lighting?   yes    Depression Screen PHQ 2/9 Scores 09/14/2017 09/06/2017 08/08/2017 07/08/2017  PHQ - 2 Score 6 6 6 5   PHQ- 9 Score - 22 22 22     Cognitive Function MMSE - Mini Mental State Exam 09/14/2017 02/10/2015  Orientation to time 5 5  Orientation to Place 5 5  Registration 3 3  Attention/ Calculation 5 5  Recall 3 2  Language- name 2 objects 2 2  Language- repeat 1 1  Language- follow 3 step command 3 3  Language- read & follow direction 1 1  Write a sentence 1 1  Copy design 1 1  Total score 30 29        Immunization History  Administered Date(s) Administered  . Influenza,inj,Quad PF,6+ Mos 02/07/2013, 03/26/2015, 03/11/2016  . Influenza-Unspecified 02/06/2014  . Pneumococcal Polysaccharide-23 12/28/2013  . Td 01/18/2012    Qualifies for Shingles Vaccine? No  Screening Tests Health Maintenance  Topic Date Due  . HIV Screening  07/21/1996  . INFLUENZA VACCINE  12/08/2017  . PNEUMOCOCCAL POLYSACCHARIDE VACCINE (2) 12/29/2018  . TETANUS/TDAP  01/17/2022   Recommend HIV screening at next visit with Dr. Louanne Skye  Additional Screenings:  Hepatitis C Screening: not indicated at this time      Plan:     Work on your goal of losing weight- by decreasing carbohydrates, avoiding sodas, and increasing exercise.  Review information given on Advanced Directives, and if you complete these bring copy to [facility] to file in chart. Follow up as  scheduled with Dr. Louanne Skye   I have personally reviewed and noted the following in the patient's chart:   . Medical and social history . Use of alcohol, tobacco or illicit drugs  . Current medications and supplements . Functional ability and status . Nutritional status . Physical  activity . Advanced directives . List of other physicians . Hospitalizations, surgeries, and ER visits in previous 12 months . Vitals . Screenings to include cognitive, depression, and falls . Referrals and appointments  In addition, I have reviewed and discussed with patient certain preventive protocols, quality metrics, and best practice recommendations. A written personalized care plan for preventive services as well as general preventive health recommendations were provided to patient.     Bernadene Bell, RN  09/15/2017

## 2017-09-16 ENCOUNTER — Ambulatory Visit (INDEPENDENT_AMBULATORY_CARE_PROVIDER_SITE_OTHER): Payer: Medicare Other | Admitting: Family Medicine

## 2017-09-16 ENCOUNTER — Encounter: Payer: Self-pay | Admitting: Family Medicine

## 2017-09-16 VITALS — BP 132/84 | HR 59 | Temp 96.8°F | Ht 77.0 in | Wt >= 6400 oz

## 2017-09-16 DIAGNOSIS — R059 Cough, unspecified: Secondary | ICD-10-CM

## 2017-09-16 DIAGNOSIS — R05 Cough: Secondary | ICD-10-CM | POA: Diagnosis not present

## 2017-09-16 MED ORDER — GUAIFENESIN-CODEINE 100-10 MG/5ML PO SOLN: 5.0000 mL | Freq: Four times a day (QID) | ORAL | 0 refills | Status: DC | PRN

## 2017-09-16 NOTE — Patient Instructions (Signed)

## 2017-09-16 NOTE — Progress Notes (Signed)
Subjective: CC: Cough PCP: Dettinger, Elige Radon, MD Danny Hinton is a 37 y.o. male presenting to clinic today for:  1. Cough Patient reports that he has had a cough for about 1 week.  He notes it initially started as a dry cough then is now mildly productive with clear sputum.  Denies any hemoptysis, fevers, chills, nausea, vomiting, diarrhea.  He notes the cough is somewhat better but still aggravating.  He has been using Claritin, Flonase and Mucinex.  He notes shortness of breath at baseline secondary to heart disease but notes that this is not increased with cough.  No wheeze.   ROS: Per HPI  No Known Allergies Past Medical History:  Diagnosis Date  . Allergy   . Anxiety   . Atypical atrial flutter (HCC) 08/19/14   pace terminated in device clinic.  AFL cycle length was 390 msec  . Automatic implantable cardioverter-defibrillator in situ    a. St Jude in 2011.  Marland Kitchen Chronic anticoagulation   . Chronic systolic congestive heart failure (HCC)    a. suspected NICM - dx 2010. EF 15% by TEE, 10-20% by echo at that time. b. s/p St. Jude AICD 2011. c. Echo 01/2010: mod dilated LV, EF 30%, mod aortic root dilitation, no significant valvular disease.  . Eczema   . Eczema   . Hypertension   . Mobitz type 2 second degree atrioventricular block    a. During sleeping hours in 2010 suspected due to ? OSA.  . Morbid obesity (HCC)   . Nonischemic cardiomyopathy (HCC)   . PAF (paroxysmal atrial fibrillation) (HCC)   . Paroxysmal atrial flutter (HCC)    s/p ablation    Current Outpatient Medications:  .  atorvastatin (LIPITOR) 40 MG tablet, Take 1 tablet (40 mg total) by mouth daily with breakfast., Disp: 90 tablet, Rfl: 3 .  buPROPion (WELLBUTRIN XL) 150 MG 24 hr tablet, Take 1 tablet (150 mg total) by mouth daily., Disp: 90 tablet, Rfl: 1 .  carvedilol (COREG) 25 MG tablet, Take 1.5 tablets (37.5 mg total) by mouth 2 (two) times daily with a meal., Disp: 270 tablet, Rfl: 3 .  citalopram  (CELEXA) 20 MG tablet, TAKE 1 TABLET BY MOUTH ONCE DAILY, Disp: 90 tablet, Rfl: 0 .  cyclobenzaprine (FLEXERIL) 10 MG tablet, Take 1 tablet (10 mg total) by mouth 3 (three) times daily as needed for muscle spasms., Disp: 90 tablet, Rfl: 0 .  digoxin (LANOXIN) 0.125 MG tablet, Take 1 tablet (125 mcg total) by mouth every other day., Disp: 45 tablet, Rfl: 2 .  fluticasone (FLONASE) 50 MCG/ACT nasal spray, Place 1 spray into both nostrils as needed., Disp: 16 g, Rfl: 11 .  furosemide (LASIX) 40 MG tablet, TAKE ONE TABLET BY MOUTH ONCE DAILY AS NEEDED FOR FLUID, Disp: 30 tablet, Rfl: 3 .  sacubitril-valsartan (ENTRESTO) 49-51 MG, Take 1 tablet by mouth 2 (two) times daily., Disp: 180 tablet, Rfl: 3 .  spironolactone (ALDACTONE) 25 MG tablet, Take 1 tablet (25 mg total) by mouth daily., Disp: 90 tablet, Rfl: 3 .  traMADol (ULTRAM) 50 MG tablet, Take 1 tablet (50 mg total) by mouth every 6 (six) hours as needed for moderate pain., Disp: 30 tablet, Rfl: 0 .  triamcinolone cream (KENALOG) 0.1 %, Apply topically 2 (two) times daily as needed., Disp: 453.6 g, Rfl: 2 .  warfarin (COUMADIN) 5 MG tablet, Take 1 tablet daily except take 1 and 1/2 tablets each Friday or as directed by anticoagulation clinic, Disp:  100 tablet, Rfl: 3 Social History   Socioeconomic History  . Marital status: Married    Spouse name: Not on file  . Number of children: Not on file  . Years of education: Not on file  . Highest education level: Not on file  Occupational History  . Not on file  Social Needs  . Financial resource strain: Not on file  . Food insecurity:    Worry: Not on file    Inability: Not on file  . Transportation needs:    Medical: Not on file    Non-medical: Not on file  Tobacco Use  . Smoking status: Never Smoker  . Smokeless tobacco: Never Used  Substance and Sexual Activity  . Alcohol use: No  . Drug use: No  . Sexual activity: Yes  Lifestyle  . Physical activity:    Days per week: Not on file      Minutes per session: Not on file  . Stress: Not on file  Relationships  . Social connections:    Talks on phone: Not on file    Gets together: Not on file    Attends religious service: Not on file    Active member of club or organization: Not on file    Attends meetings of clubs or organizations: Not on file    Relationship status: Not on file  . Intimate partner violence:    Fear of current or ex partner: Not on file    Emotionally abused: Not on file    Physically abused: Not on file    Forced sexual activity: Not on file  Other Topics Concern  . Not on file  Social History Narrative  . Not on file   Family History  Problem Relation Age of Onset  . Cancer Brother 18       hodgekins lymphoma  . Heart disease Mother        artial flutter  . Heart disease Maternal Grandfather   . Diabetes Paternal Grandmother     Objective: Office vital signs reviewed. BP 132/84   Pulse (!) 59   Temp (!) 96.8 F (36 C) (Oral)   Ht 6\' 5"  (1.956 m)   Wt (!) 447 lb (202.8 kg)   SpO2 94%   BMI 53.01 kg/m   Physical Examination:  General: Awake, alert, obese, No acute distress HEENT: Normal    Neck: No masses palpated. No lymphadenopathy    Ears: Tympanic membranes intact, normal light reflex, no erythema, no bulging    Eyes: PERRLA, extraocular membranes intact, sclera white    Nose: nasal turbinates moist, clear nasal discharge    Throat: moist mucus membranes, no erythema, no tonsillar exudate.  Airway is patent Cardio: regular rate and rhythm, S1S2 heard, no murmurs appreciated Pulm: clear to auscultation bilaterally, no wheezes, rhonchi or rales; normal work of breathing on room air  Assessment/ Plan: 37 y.o. male   1. Cough in adult patient No evidence of infectious etiology.  Likely secondary to allergies.  Continue current allergy regimen.  We did discuss that he could potentially increase Claritin to twice daily if needed but that this may result in excessive dryness.   His insurance does not cover Tessalon.  We discussed consideration for Robitussin-AC.  I cautioned sedation.  Patient does wish to proceed with cough medication.  [facility] narcotic database was reviewed and there were no red flags.  Medication was sent into the pharmacy.  Home care instructions and reasons for return discussed.  Patient  was good understanding will follow-up as needed.    Meds ordered this encounter  Medications  . guaiFENesin-codeine 100-10 MG/5ML syrup    Sig: Take 5 mLs by mouth every 6 (six) hours as needed for cough.    Dispense:  100 mL    Refill:  0     Lillyen Schow Hulen Skains, DO [facility] [phone]

## 2017-10-10 ENCOUNTER — Ambulatory Visit: Payer: Medicare Other | Admitting: Family Medicine

## 2017-10-11 ENCOUNTER — Other Ambulatory Visit: Payer: Self-pay | Admitting: Family Medicine

## 2017-10-11 DIAGNOSIS — F419 Anxiety disorder, unspecified: Principal | ICD-10-CM

## 2017-10-11 DIAGNOSIS — F329 Major depressive disorder, single episode, unspecified: Secondary | ICD-10-CM

## 2017-10-11 DIAGNOSIS — I10 Essential (primary) hypertension: Secondary | ICD-10-CM

## 2017-10-11 DIAGNOSIS — I480 Paroxysmal atrial fibrillation: Secondary | ICD-10-CM

## 2017-10-11 DIAGNOSIS — F32A Depression, unspecified: Secondary | ICD-10-CM

## 2017-10-12 ENCOUNTER — Encounter: Payer: Self-pay | Admitting: Family Medicine

## 2017-10-12 ENCOUNTER — Ambulatory Visit (INDEPENDENT_AMBULATORY_CARE_PROVIDER_SITE_OTHER): Payer: Medicare Other | Admitting: Family Medicine

## 2017-10-12 VITALS — BP 97/63 | HR 59 | Temp 97.8°F | Ht 77.0 in | Wt >= 6400 oz

## 2017-10-12 DIAGNOSIS — Z7901 Long term (current) use of anticoagulants: Secondary | ICD-10-CM | POA: Diagnosis not present

## 2017-10-12 DIAGNOSIS — I480 Paroxysmal atrial fibrillation: Secondary | ICD-10-CM

## 2017-10-12 LAB — COAGUCHEK XS/INR WAIVED
INR: 2.6 — AB (ref 0.9–1.1)
PROTHROMBIN TIME: 31.7 s

## 2017-10-12 NOTE — Progress Notes (Signed)
BP 97/63   Pulse (!) 59   Temp 97.8 F (36.6 C) (Oral)   Ht 6\' 5"  (1.956 m)   Wt (!) 448 lb 12.8 oz (203.6 kg)   BMI 53.22 kg/m    Subjective:    Patient ID: Danny Hinton, male    DOB: 28-Jul-1980, 37 y.o.   MRN: [id number]  HPI: Danny Hinton is a 37 y.o. male presenting on 10/12/2017 for Coagulation Disorder   HPI Patient is coming in today for Coumadin/INR recheck, he is not currently feeling flutters or A. fib.  He denies any bleeding or blood in his stool.  He has been stable on his dose for at least a 8weeks.  His last INR check was good.  Relevant past medical, surgical, family and social history reviewed and updated as indicated. Interim medical history since our last visit reviewed. Allergies and medications reviewed and updated.  Review of Systems  Constitutional: Negative for chills and fever.  Respiratory: Negative for shortness of breath and wheezing.   Cardiovascular: Negative for chest pain, palpitations and leg swelling.  Gastrointestinal: Negative for anal bleeding and blood in stool.  Musculoskeletal: Negative for back pain and gait problem.  Skin: Negative for rash.  All other systems reviewed and are negative.   Per HPI unless specifically indicated above   Allergies as of 10/12/2017   No Known Allergies     Medication List        Accurate as of 10/12/17 11:22 AM. Always use your most recent med list.          atorvastatin 40 MG tablet Commonly known as:  LIPITOR Take 1 tablet (40 mg total) by mouth daily with breakfast.   buPROPion 150 MG 24 hr tablet Commonly known as:  WELLBUTRIN XL Take 1 tablet (150 mg total) by mouth daily.   carvedilol 25 MG tablet Commonly known as:  COREG Take 1.5 tablets (37.5 mg total) by mouth 2 (two) times daily with a meal.   citalopram 20 MG tablet Commonly known as:  CELEXA TAKE 1 TABLET BY MOUTH ONCE DAILY   cyclobenzaprine 10 MG tablet Commonly known as:  FLEXERIL Take 1 tablet (10 mg total) by mouth 3  (three) times daily as needed for muscle spasms.   digoxin 0.125 MG tablet Commonly known as:  LANOXIN TAKE 1 TABLET BY MOUTH EVERY OTHER DAY   fluticasone 50 MCG/ACT nasal spray Commonly known as:  FLONASE Place 1 spray into both nostrils as needed.   furosemide 40 MG tablet Commonly known as:  LASIX TAKE ONE TABLET BY MOUTH ONCE DAILY AS NEEDED FOR FLUID   guaiFENesin-codeine 100-10 MG/5ML syrup Take 5 mLs by mouth every 6 (six) hours as needed for cough.   sacubitril-valsartan 49-51 MG Commonly known as:  ENTRESTO Take 1 tablet by mouth 2 (two) times daily.   spironolactone 25 MG tablet Commonly known as:  ALDACTONE Take 1 tablet (25 mg total) by mouth daily.   triamcinolone cream 0.1 % Commonly known as:  KENALOG Apply topically 2 (two) times daily as needed.   warfarin 5 MG tablet Commonly known as:  COUMADIN Take as directed by the anticoagulation clinic. If you are unsure how to take this medication, talk to your nurse or doctor. Original instructions:  Take 1 tablet daily except take 1 and 1/2 tablets each Friday or as directed by anticoagulation clinic          Objective:    BP 97/63   Pulse Marland Kitchen)  59   Temp 97.8 F (36.6 C) (Oral)   Ht 6\' 5"  (1.956 m)   Wt (!) 448 lb 12.8 oz (203.6 kg)   BMI 53.22 kg/m   Wt Readings from Last 3 Encounters:  10/12/17 (!) 448 lb 12.8 oz (203.6 kg)  09/16/17 (!) 447 lb (202.8 kg)  09/14/17 (!) 444 lb (201.4 kg)    Physical Exam  Constitutional: He is oriented to person, place, and time. He appears well-developed and well-nourished. No distress.  Eyes: Conjunctivae are normal. No scleral icterus.  Cardiovascular: Normal rate, regular rhythm, normal heart sounds and intact distal pulses.  No murmur heard. Pulmonary/Chest: Effort normal and breath sounds normal. No respiratory distress. He has no wheezes.  Neurological: He is alert and oriented to person, place, and time. Coordination normal.  Skin: Skin is warm and dry.  No rash noted. He is not diaphoretic.  Psychiatric: He has a normal mood and affect. His behavior is normal.  Nursing note and vitals reviewed.   Description   Continue taking 1.5 tablets or 7.5 mg on Monday Wednesday Friday and 1 tablet for 5 mg the rest of the week  INR today 2.6 (goal 2-3)         Assessment & Plan:   Problem List Items Addressed This Visit      Cardiovascular and Mediastinum   Paroxysmal atrial fibrillation (HCC)     Other   Long term current use of anticoagulant therapy - Primary   Relevant Orders   CoaguChek XS/INR Waived (Completed)       Follow up plan: Return if symptoms worsen or fail to improve.  Counseling provided for all of the vaccine components Orders Placed This Encounter  Procedures  . CoaguChek XS/INR Waived    Arville Care, MD [facility] 10/12/2017, 11:22 AM

## 2017-10-12 NOTE — Patient Instructions (Signed)
Description   Continue taking 1.5 tablets or 7.5 mg on Monday Wednesday Friday and 1 tablet for 5 mg the rest of the week  INR today 2.6 (goal 2-3)

## 2017-10-14 ENCOUNTER — Encounter: Payer: Self-pay | Admitting: Family Medicine

## 2017-10-19 ENCOUNTER — Encounter: Payer: Self-pay | Admitting: Cardiology

## 2017-11-01 ENCOUNTER — Ambulatory Visit (INDEPENDENT_AMBULATORY_CARE_PROVIDER_SITE_OTHER): Payer: Medicare Other | Admitting: *Deleted

## 2017-11-01 DIAGNOSIS — I428 Other cardiomyopathies: Secondary | ICD-10-CM

## 2017-11-02 ENCOUNTER — Encounter: Payer: Self-pay | Admitting: Cardiology

## 2017-11-02 NOTE — Progress Notes (Signed)
Remote ICD transmission.   

## 2017-11-04 ENCOUNTER — Ambulatory Visit (INDEPENDENT_AMBULATORY_CARE_PROVIDER_SITE_OTHER): Payer: Medicare Other | Admitting: Pharmacist Clinician (PhC)/ Clinical Pharmacy Specialist

## 2017-11-04 ENCOUNTER — Ambulatory Visit: Payer: Medicare Other | Admitting: Pharmacist Clinician (PhC)/ Clinical Pharmacy Specialist

## 2017-11-04 DIAGNOSIS — I484 Atypical atrial flutter: Secondary | ICD-10-CM | POA: Diagnosis not present

## 2017-11-04 DIAGNOSIS — Z7901 Long term (current) use of anticoagulants: Secondary | ICD-10-CM

## 2017-11-04 DIAGNOSIS — I480 Paroxysmal atrial fibrillation: Secondary | ICD-10-CM

## 2017-11-04 DIAGNOSIS — I4892 Unspecified atrial flutter: Secondary | ICD-10-CM | POA: Diagnosis not present

## 2017-11-04 LAB — CUP PACEART REMOTE DEVICE CHECK
Battery Remaining Longevity: 26 mo
Battery Remaining Percentage: 27 %
Battery Voltage: 2.83 V
Brady Statistic AP VS Percent: 29 %
Brady Statistic AS VP Percent: 1 %
Brady Statistic AS VS Percent: 71 %
Date Time Interrogation Session: 20190625173956
HighPow Impedance: 41 Ohm
Implantable Lead Implant Date: 20111010
Implantable Lead Location: 753859
Implantable Lead Location: 753860
Lead Channel Impedance Value: 460 Ohm
Lead Channel Pacing Threshold Pulse Width: 0.5 ms
Lead Channel Pacing Threshold Pulse Width: 0.9 ms
Lead Channel Sensing Intrinsic Amplitude: 11.7 mV
Lead Channel Setting Pacing Amplitude: 2.5 V
Lead Channel Setting Pacing Amplitude: 2.5 V
Lead Channel Setting Pacing Pulse Width: 0.5 ms
Lead Channel Setting Sensing Sensitivity: 0.5 mV
MDC IDC LEAD IMPLANT DT: 20111010
MDC IDC MSMT LEADCHNL RA PACING THRESHOLD AMPLITUDE: 1.25 V
MDC IDC MSMT LEADCHNL RA SENSING INTR AMPL: 3.1 mV
MDC IDC MSMT LEADCHNL RV IMPEDANCE VALUE: 390 Ohm
MDC IDC MSMT LEADCHNL RV PACING THRESHOLD AMPLITUDE: 0.75 V
MDC IDC PG IMPLANT DT: 20111010
MDC IDC PG SERIAL: 621444
MDC IDC STAT BRADY AP VP PERCENT: 1 %
MDC IDC STAT BRADY RA PERCENT PACED: 28 %
MDC IDC STAT BRADY RV PERCENT PACED: 1 %

## 2017-11-04 LAB — COAGUCHEK XS/INR WAIVED
INR: 2.6 — AB (ref 0.9–1.1)
Prothrombin Time: 31.6 s

## 2017-11-30 ENCOUNTER — Encounter: Payer: Self-pay | Admitting: Cardiovascular Disease

## 2017-12-02 ENCOUNTER — Encounter: Payer: Medicare Other | Admitting: Pharmacist Clinician (PhC)/ Clinical Pharmacy Specialist

## 2017-12-05 ENCOUNTER — Encounter: Payer: Self-pay | Admitting: Cardiovascular Disease

## 2017-12-11 ENCOUNTER — Other Ambulatory Visit: Payer: Self-pay | Admitting: Family Medicine

## 2017-12-11 ENCOUNTER — Other Ambulatory Visit: Payer: Self-pay | Admitting: Cardiovascular Disease

## 2017-12-11 DIAGNOSIS — I10 Essential (primary) hypertension: Secondary | ICD-10-CM

## 2017-12-12 ENCOUNTER — Telehealth: Payer: Self-pay | Admitting: Cardiovascular Disease

## 2017-12-12 NOTE — Telephone Encounter (Signed)
New message    Pt c/o Shortness Of Breath: STAT if SOB developed within the last 24 hours or pt is noticeably SOB on the phone  1. Are you currently SOB (can you hear that pt is SOB on the phone)? No   2. How long have you been experiencing SOB? Within the last 2 months  3. Are you SOB when sitting or when up moving around? Moving around  4. Are you currently experiencing any other symptoms? Fatigue,  Numbness in feet when patient first wakes up.  Patient has appointment in [place] will he need to seen sooner?

## 2017-12-12 NOTE — Progress Notes (Signed)
Cardiology Office Note:    Date:  12/13/2017   ID:  Danny Hinton, DOB Jun 30, 1980, MRN [id number]  PCP:  Dettinger, Elige Radon, MD  Cardiologist:  Thurmon Fair, MD   Referring MD: Dettinger, Elige Radon, MD   Chief Complaint  Patient presents with  . Follow-up    DOE    History of Present Illness:    Danny Hinton is a 37 y.o. male with a hx of paroxysmal atrial fibrillation, atypical atrial flutter s/p ablation (2015), chronic systolic heart failure, nonischemic cardiomyopathy s/p ICD in place, HTN, HLD, and obesity. He is anticoagulated with coumadin.  He has NYHA functional class III DOE. Last EF was 35-40%.  He was last seen in clinic by Dr. Royann Shivers on 02/15/17. At that time, he was doing well and on optimal medical therapy. He was tolerating coumadin well.   Pt called office reporting acute shortness of breath and 4-pillow orthopnea. He has used PRN diuretics without resolution of his SOB.   Mr. Zabriskie main complaint today is dyspnea on exertion.  He states that it has been too hot outside for him to do any walking at all.  He states it is difficult for him to get to the grocery store or [employer] to walk aisles.  He also reports 4 pillow orthopnea.  His dyspnea on exertion has not improved with PRN Lasix.  He reports this dyspnea on exertion is a worsening each week.  He states that he becomes dizzy frequently when he stands from a sitting position.  He also reports pins-and-needles sensation on the bottoms of his feet when he wakes up in the morning.  He continues to have problems obtaining his disability.  Modified orthostatic vitals today: Sitting: 107/67 Standing: 83/59 Standing: 97/47   Past Medical History:  Diagnosis Date  . Allergy   . Anxiety   . Atypical atrial flutter (HCC) 08/19/14   pace terminated in device clinic.  AFL cycle length was 390 msec  . Automatic implantable cardioverter-defibrillator in situ    a. St Jude in 2011.  Marland Kitchen Chronic anticoagulation   . Chronic  systolic congestive heart failure (HCC)    a. suspected NICM - dx 2010. EF 15% by TEE, 10-20% by echo at that time. b. s/p St. Jude AICD 2011. c. Echo 01/2010: mod dilated LV, EF 30%, mod aortic root dilitation, no significant valvular disease.  . Eczema   . Eczema   . Hypertension   . Mobitz type 2 second degree atrioventricular block    a. During sleeping hours in 2010 suspected due to ? OSA.  . Morbid obesity (HCC)   . Nonischemic cardiomyopathy (HCC)   . PAF (paroxysmal atrial fibrillation) (HCC)   . Paroxysmal atrial flutter [facility])    s/p ablation    Past Surgical History:  Procedure Laterality Date  . ATRIAL FLUTTER ABLATION N/A 01/18/2014   Procedure: ATRIAL FLUTTER ABLATION;  Surgeon: Dymon Summerhill Salvia, MD;  Location: [facility] CATH LAB;  Service: Cardiovascular;  Laterality: N/A;  . CARDIAC DEFIBRILLATOR PLACEMENT  02/17/10   St. Jude Medical 45DR, model number [id number], serial number [id number]  . CARDIOVERSION N/A 12/29/2013   Procedure: CARDIOVERSION;  Surgeon: Kaliyah Gladman Salvia, MD;  Location: [facility] OR;  Service: Cardiovascular;  Laterality: N/A;  . TOOTH EXTRACTION N/A 10/23/2012   Procedure: EXTRACTION TEETH 1, 16, 17, 30, 31;  Surgeon: [name], DDS;  Location: [facility] OR;  Service: Oral Surgery;  Laterality: N/A;    Current Medications: Current Meds  Medication Sig  . atorvastatin (LIPITOR) 40 MG tablet Take 1 tablet (40 mg total) by mouth daily with breakfast.  . buPROPion (WELLBUTRIN XL) 150 MG 24 hr tablet Take 1 tablet (150 mg total) by mouth daily.  . carvedilol (COREG) 25 MG tablet Take 1 tablet (25 mg total) by mouth 2 (two) times daily with a meal.  . citalopram (CELEXA) 20 MG tablet TAKE 1 TABLET BY MOUTH ONCE DAILY  . cyclobenzaprine (FLEXERIL) 10 MG tablet Take 1 tablet (10 mg total) by mouth 3 (three) times daily as needed for muscle spasms.  . digoxin (LANOXIN) 0.125 MG tablet TAKE 1 TABLET BY MOUTH EVERY OTHER DAY  . ENTRESTO 49-51 MG TAKE 1 TABLET BY MOUTH TWICE DAILY  .  fluticasone (FLONASE) 50 MCG/ACT nasal spray Place 1 spray into both nostrils as needed.  Marland Kitchen guaiFENesin-codeine 100-10 MG/5ML syrup Take 5 mLs by mouth every 6 (six) hours as needed for cough.  Marland Kitchen spironolactone (ALDACTONE) 25 MG tablet Take 1 tablet (25 mg total) by mouth daily.  Marland Kitchen triamcinolone cream (KENALOG) 0.1 % Apply topically 2 (two) times daily as needed.  . warfarin (COUMADIN) 5 MG tablet Take 1 tablet daily except take 1 and 1/2 tablets each Friday or as directed by anticoagulation clinic  . [DISCONTINUED] carvedilol (COREG) 25 MG tablet TAKE 1 & 1/2 (ONE & ONE-HALF) TABLETS BY MOUTH TWICE DAILY WITH  A  MEAL  . [DISCONTINUED] furosemide (LASIX) 40 MG tablet Take 1 tablet (40 mg total) by mouth as needed for fluid or edema. KEEP OV.     Allergies:   Patient has no known allergies.   Social History   Socioeconomic History  . Marital status: Married    Spouse name: Not on file  . Number of children: Not on file  . Years of education: Not on file  . Highest education level: Not on file  Occupational History  . Not on file  Social Needs  . Financial resource strain: Not on file  . Food insecurity:    Worry: Not on file    Inability: Not on file  . Transportation needs:    Medical: Not on file    Non-medical: Not on file  Tobacco Use  . Smoking status: Never Smoker  . Smokeless tobacco: Never Used  Substance and Sexual Activity  . Alcohol use: No  . Drug use: No  . Sexual activity: Yes  Lifestyle  . Physical activity:    Days per week: Not on file    Minutes per session: Not on file  . Stress: Not on file  Relationships  . Social connections:    Talks on phone: Not on file    Gets together: Not on file    Attends religious service: Not on file    Active member of club or organization: Not on file    Attends meetings of clubs or organizations: Not on file    Relationship status: Not on file  Other Topics Concern  . Not on file  Social History Narrative  . Not  on file     Family History: The patient's family history includes Cancer (age of onset: 58) in his brother; Diabetes in his paternal grandmother; Heart disease in his maternal grandfather and mother.  ROS:   Please see the history of present illness.    All other systems reviewed and are negative.  EKGs/Labs/Other Studies Reviewed:    The following studies were reviewed today:  Echo 07/31/15: Study Conclusions - Left  ventricle: The cavity size was mildly dilated. Wall   thickness was increased in a pattern of mild LVH. Systolic   function was moderately reduced. The estimated ejection fraction   was in the range of 35% to 40%. Diffuse hypokinesis. The study is   not technically sufficient to allow evaluation of LV diastolic   function. - Aortic root: The aortic root was moderately dilated. - Left atrium: The atrium was mildly dilated. - Right ventricle: The cavity size was mildly dilated. - Pericardium, extracardiac: A trivial pericardial effusion was   identified.  Impressions: - Moderate global reduction in LV function; mild LVH and LVE; mild   LAE; mild RVE, trace TR. Aortic root moderately dilated (4.7 cm);   suggest CTA or MRA to further assess.   EKG:  EKG is not ordered today.    Recent Labs: 07/08/2017: ALT 42; BUN 13; Creatinine, Ser 1.03; Hemoglobin 12.2; Platelets 231; Potassium 4.4; Sodium 137  Recent Lipid Panel    Component Value Date/Time   CHOL 112 07/08/2017 0859   TRIG 64 07/08/2017 0859   HDL 34 (L) 07/08/2017 0859   CHOLHDL 3.3 07/08/2017 0859   CHOLHDL 5.3 03/20/2009 0410   VLDL 22 03/20/2009 0410   LDLCALC 65 07/08/2017 0859   LDLDIRECT 52 03/26/2015 0824    Physical Exam:    VS:  BP 110/70   Pulse 69   Ht 6\' 5"  (1.956 m)   Wt (!) 444 lb 12.8 oz (201.8 kg)   SpO2 94%   BMI 52.75 kg/m     Wt Readings from Last 3 Encounters:  12/13/17 (!) 444 lb 12.8 oz (201.8 kg)  10/12/17 (!) 448 lb 12.8 oz (203.6 kg)  09/16/17 (!) 447 lb (202.8  kg)     GEN: Morbidly obese AA male in no acute distress HEENT: Normal NECK: No JVD; No carotid bruits LYMPHATICS: No lymphadenopathy CARDIAC: RRR, no murmurs, rubs, gallops RESPIRATORY:  Clear to auscultation without rales, wheezing or rhonchi  ABDOMEN: Soft, non-tender, non-distended MUSCULOSKELETAL:  Trace edema; No deformity  SKIN: Warm and dry NEUROLOGIC:  Alert and oriented x 3 PSYCHIATRIC:  Normal affect   ASSESSMENT:    1. Non-ischemic cardiomyopathy (HCC)   2. Essential hypertension   3. Chronic systolic congestive heart failure (HCC)   4. DOE (dyspnea on exertion)   5. Morbid obesity (HCC)   6. Pain in both feet   7. Medication management   8. Impaired fasting glucose    PLAN:    In order of problems listed above:  Non-ischemic cardiomyopathy (HCC) Chronic systolic congestive heart failure (HCC) DOE (dyspnea on exertion) Morbid obesity (HCC) I suspect a large portion of his dyspnea on exertion is related to deconditioning and his weight. He weighs 444 lbs this visit, which is the same as his weight in May.  I will increase his Lasix to 20 mg every morning.  Because his orthostatic vitals were positive for hypotension, will decrease his Coreg to 25 mg twice daily.  I would like to see him back in 3 to 4 weeks.  Discussed fluid restriction and reviewed his diet.  He is following a low-salt diet.  He drinks less than 2 L of fluid daily.  We discussed implementing a walking program.  He will work on getting a treadmill, in the meantime will walk at [place] or a grocery store. Collected BMP today and will BMP in 1 week.  Essential hypertension Blood pressure is well controlled today.  In fact he has evidence  of orthostatic hypotension.  I will decrease Coreg to 25 mg twice daily in the presence of increasing Lasix to 20 mg daily  Pain in both feet Impaired fasting glucose This description sounds consistent with neuropathy.  In his overall clinical picture, we will  collect an A1c today to evaluate diabetes.  Medication management Medication changes as above.   I would like to see him back in 3 to 4 weeks.   Medication Adjustments/Labs and Tests Ordered: Current medicines are reviewed at length with the patient today.  Concerns regarding medicines are outlined above.  Orders Placed This Encounter  Procedures  . CBC  . Basic metabolic panel  . Hemoglobin A1c  . Basic metabolic panel   Meds ordered this encounter  Medications  . carvedilol (COREG) 25 MG tablet    Sig: Take 1 tablet (25 mg total) by mouth 2 (two) times daily with a meal.    Dispense:  180 tablet    Refill:  3    No refill needed , patient will call when needed  . furosemide (LASIX) 20 MG tablet    Sig: Take 1 tablet (20 mg total) by mouth daily.    Dispense:  90 tablet    Refill:  3    Signed, Marcelino Duster, [place]  12/13/2017 12:44 PM    [facility]

## 2017-12-12 NOTE — Telephone Encounter (Signed)
Pt called to report that he has been experiencing worsening shortness of breath over there past several weeks. He says that he cannot even walk a half a block without having to stop and try to breathe. He says he has been sleeping with 4 pillows every night. He denies dizziness and chest pain. He says he uses prn diuretics for ankle edema and it seem to help. He just doesn't have the same quality of life and not abe to do his ADL's due to the SOB. I advised pt that he should be seen soon and pt agrees.. Appt made with Micah Flesher tomorrow 12/13/17.

## 2017-12-12 NOTE — Telephone Encounter (Signed)
Left message for pt to call back  °

## 2017-12-13 ENCOUNTER — Telehealth: Payer: Self-pay | Admitting: Cardiovascular Disease

## 2017-12-13 ENCOUNTER — Encounter: Payer: Self-pay | Admitting: Physician Assistant

## 2017-12-13 ENCOUNTER — Encounter: Payer: Self-pay | Admitting: Cardiovascular Disease

## 2017-12-13 ENCOUNTER — Ambulatory Visit (INDEPENDENT_AMBULATORY_CARE_PROVIDER_SITE_OTHER): Payer: Medicare Other | Admitting: Physician Assistant

## 2017-12-13 VITALS — BP 110/70 | HR 69 | Ht 77.0 in | Wt >= 6400 oz

## 2017-12-13 DIAGNOSIS — I10 Essential (primary) hypertension: Secondary | ICD-10-CM

## 2017-12-13 DIAGNOSIS — R7301 Impaired fasting glucose: Secondary | ICD-10-CM

## 2017-12-13 DIAGNOSIS — M79672 Pain in left foot: Secondary | ICD-10-CM

## 2017-12-13 DIAGNOSIS — Z79899 Other long term (current) drug therapy: Secondary | ICD-10-CM | POA: Diagnosis not present

## 2017-12-13 DIAGNOSIS — I5022 Chronic systolic (congestive) heart failure: Secondary | ICD-10-CM

## 2017-12-13 DIAGNOSIS — R0609 Other forms of dyspnea: Secondary | ICD-10-CM

## 2017-12-13 DIAGNOSIS — M79671 Pain in right foot: Secondary | ICD-10-CM

## 2017-12-13 DIAGNOSIS — I428 Other cardiomyopathies: Secondary | ICD-10-CM

## 2017-12-13 MED ORDER — FUROSEMIDE 20 MG PO TABS: 20.0000 mg | ORAL_TABLET | Freq: Every day | ORAL | 3 refills | Status: DC

## 2017-12-13 MED ORDER — CARVEDILOL 25 MG PO TABS: 25.0000 mg | ORAL_TABLET | Freq: Two times a day (BID) | ORAL | 3 refills | Status: DC

## 2017-12-13 NOTE — Patient Instructions (Signed)
Medication changes  Decrease LASIX 20 MG  ONE TABLET DAILY DECREASE COREG 25 MG ONE TABLET  TWICE  A DAY  LABS TODAY  CBC BMP HGBA1C   NEXT WEEK - Wednesday AUG 14,2019 BMP     Your physician recommends that you schedule a follow-up appointment in 3-4 WEEKS WITH [email or web address].PA.

## 2017-12-13 NOTE — Telephone Encounter (Signed)
New problem   Pt calling and want to know when was his last Echocardiogram and what was his percentage. Please call pt.

## 2017-12-13 NOTE — Telephone Encounter (Signed)
Returned call to patient, advised last echo 20117, no echo in 2018.   He is wondering if this could be repeated to see what his EF is now.   Patient saw PA today-routed to PA to review

## 2017-12-13 NOTE — Telephone Encounter (Signed)
Patient aware and verbalized understanding. °

## 2017-12-13 NOTE — Telephone Encounter (Signed)
I saw the patient today and I have a low suspicion that his shortness of breath on exertion is related to a further reduction in his EF. He did not have JVP, lower extremity edema, and his lungs were clear. We will try these medication changes first. He is to return in 3-4 weeks.

## 2017-12-14 ENCOUNTER — Telehealth: Payer: Self-pay | Admitting: Physician Assistant

## 2017-12-14 LAB — CBC
HEMATOCRIT: 36.8 % — AB (ref 37.5–51.0)
HEMOGLOBIN: 12 g/dL — AB (ref 13.0–17.7)
MCH: 28.3 pg (ref 26.6–33.0)
MCHC: 32.6 g/dL (ref 31.5–35.7)
MCV: 87 fL (ref 79–97)
Platelets: 240 10*3/uL (ref 150–450)
RBC: 4.24 x10E6/uL (ref 4.14–5.80)
RDW: 14.9 % (ref 12.3–15.4)
WBC: 3.3 10*3/uL — ABNORMAL LOW (ref 3.4–10.8)

## 2017-12-14 LAB — BASIC METABOLIC PANEL
BUN/Creatinine Ratio: 10 (ref 9–20)
BUN: 8 mg/dL (ref 6–20)
CALCIUM: 8.9 mg/dL (ref 8.7–10.2)
CO2: 24 mmol/L (ref 20–29)
CREATININE: 0.77 mg/dL (ref 0.76–1.27)
Chloride: 99 mmol/L (ref 96–106)
GFR calc Af Amer: 135 mL/min/{1.73_m2} (ref >59)
GFR, EST NON AFRICAN AMERICAN: 117 mL/min/{1.73_m2} (ref >59)
Glucose: 88 mg/dL (ref 65–99)
Potassium: 4.1 mmol/L (ref 3.5–5.2)
Sodium: 136 mmol/L (ref 134–144)

## 2017-12-14 LAB — HEMOGLOBIN A1C
ESTIMATED AVERAGE GLUCOSE: 154 mg/dL
HEMOGLOBIN A1C: 7 % — AB (ref 4.8–5.6)

## 2017-12-14 NOTE — Telephone Encounter (Signed)
New Message    Patient is calling to review labs. Please call to discuss.

## 2017-12-14 NOTE — Telephone Encounter (Signed)
Called patient, advised of lab work results.   Patient had no questions or concerns regarding results.

## 2017-12-16 ENCOUNTER — Ambulatory Visit: Payer: Medicare Other | Admitting: Pharmacist Clinician (PhC)/ Clinical Pharmacy Specialist

## 2017-12-16 DIAGNOSIS — I4892 Unspecified atrial flutter: Secondary | ICD-10-CM

## 2017-12-16 DIAGNOSIS — I480 Paroxysmal atrial fibrillation: Secondary | ICD-10-CM

## 2017-12-16 DIAGNOSIS — I484 Atypical atrial flutter: Secondary | ICD-10-CM

## 2017-12-16 DIAGNOSIS — Z7901 Long term (current) use of anticoagulants: Secondary | ICD-10-CM

## 2017-12-16 DIAGNOSIS — R739 Hyperglycemia, unspecified: Secondary | ICD-10-CM

## 2017-12-16 NOTE — Patient Instructions (Signed)
Description   Take 1/2 tablet today.  Then take 1 1/2 tablets on Mondays and Fridays and 1 tablet all other days of the week.  INR today 3.5 (goal 2-3)   A little thin today

## 2017-12-20 LAB — COAGUCHEK XS/INR WAIVED
INR: 3.5 — AB (ref 0.9–1.1)
Prothrombin Time: 42.5 s

## 2017-12-20 LAB — GLUCOSE HEMOCUE WAIVED: GLU HEMOCUE WAIVED: 123 mg/dL — AB (ref 65–99)

## 2017-12-22 ENCOUNTER — Other Ambulatory Visit: Payer: Self-pay | Admitting: Cardiovascular Disease

## 2017-12-22 NOTE — Telephone Encounter (Signed)
Rx request sent to pharmacy.  

## 2018-01-02 ENCOUNTER — Other Ambulatory Visit: Payer: Self-pay | Admitting: Family Medicine

## 2018-01-02 ENCOUNTER — Telehealth: Payer: Self-pay | Admitting: Cardiovascular Disease

## 2018-01-02 DIAGNOSIS — I480 Paroxysmal atrial fibrillation: Secondary | ICD-10-CM

## 2018-01-02 DIAGNOSIS — Z7901 Long term (current) use of anticoagulants: Secondary | ICD-10-CM

## 2018-01-02 NOTE — Telephone Encounter (Signed)
Returned call to patient he stated he wanted to ask Dr.Croitoru if he has sent disability letter back to his lawyer.Also he has been having memory problems,unable to remember.Stated has got worse since he started on Entresto.Message sent to Dr.Croitoru.

## 2018-01-02 NOTE — Telephone Encounter (Signed)
New Message:    Pt calling regarding a disability letter.  Pt states due to medication change her memory has decreased and BP is the same.

## 2018-01-04 ENCOUNTER — Encounter: Payer: Self-pay | Admitting: Cardiovascular Disease

## 2018-01-04 NOTE — Telephone Encounter (Signed)
° °  Patient requesting response to MyChart message °

## 2018-01-06 ENCOUNTER — Telehealth: Payer: Self-pay | Admitting: Cardiovascular Disease

## 2018-01-06 ENCOUNTER — Ambulatory Visit (INDEPENDENT_AMBULATORY_CARE_PROVIDER_SITE_OTHER): Payer: Medicare Other | Admitting: Pharmacist Clinician (PhC)/ Clinical Pharmacy Specialist

## 2018-01-06 DIAGNOSIS — I480 Paroxysmal atrial fibrillation: Secondary | ICD-10-CM | POA: Diagnosis not present

## 2018-01-06 DIAGNOSIS — Z7901 Long term (current) use of anticoagulants: Secondary | ICD-10-CM

## 2018-01-06 LAB — COAGUCHEK XS/INR WAIVED
INR: 2.8 — ABNORMAL HIGH (ref 0.9–1.1)
PROTHROMBIN TIME: 34.2 s

## 2018-01-06 NOTE — Telephone Encounter (Signed)
Insurance information sent to [employer] (att [employer])

## 2018-01-06 NOTE — Patient Instructions (Signed)
Description    Continue to take 1 1/2 tablets on Mondays and Fridays and 1 tablet all other days of the week.  INR today 2.8 (goal 2-3)   Perfect reading today

## 2018-01-06 NOTE — Telephone Encounter (Signed)
New message:      [name] is calling and needing insurance information for the pt to enroll the pt in a program

## 2018-01-10 NOTE — Progress Notes (Addendum)
Cardiology Office Note:    Date:  01/11/2018   ID:  Danny E Sweitzer, DOB 05/30/1980, MRN [id number]  PCP:  Dettinger, Elige Radon, MD  Cardiologist:  Thurmon Fair, MD   Referring MD: Dettinger, Elige Radon, MD   Chief Complaint  Patient presents with  . Follow-up    palpitations    History of Present Illness:    Danny Hinton is a 37 y.o. male with a hx of paroxysmal atrial fibrillation, atypical atrial flutter s/p ablation (2015), chronic systolic heart failure, nonischemic cardiomyopathy s/p ICD in place, HTN, HLD, and obesity. He is anticoagulated with coumadin and has a NYHA functional class III DOE. Last EF was 35-40%.  I last saw him in clinic on 12/13/17. At that time, he complained of DOE. I suspected this was related to his morbid obesity with an office weight of 444 lbs. I increased his lasix to 20 mg every morning and decrased his coreg to 25 mg BID in the setting of positive orthostatic hypotension. We discussed a walking program. He described symptoms of peripheral neuropathy, A1c was 7.0%.   He returns today for follow up on the above medication changes. He is very anxious because his disability has been denied, despite written communication by Dr. Royann Shivers with the patient's legals staff. Pt complains of memory problems.  He continues to have SOB and dizziness upon standing. He thinks he has been having Afib. On Sunday, he had trouble swallowing and BP machine/smart watch registered heart rates in the 150-180s. He felt palpitations and his heart thumping in his chest. He was able to sleep Sunday night and felt well on Monday, without recurrence of symptoms. He is trying very hard with diet and exercise. He is walking 5 min on the treadmill every other day. He is cutting out breads, pastas, and sodas from his diet. We discussed healthy fruit options.  Dr. Royann Shivers reviewed his pacemaker interrogation that showed atrial tachycardia with rates generally in the 150s but as high as 199 all day  on Sunday.  He has not had symptoms since that time.  He continues to be dizzy upon standing.  He is euvolemic on exam.  EKG with an atrial paced V sensed rhythm.  Heart rate 60.   Past Medical History:  Diagnosis Date  . Allergy   . Anxiety   . Atypical atrial flutter (HCC) 08/19/14   pace terminated in device clinic.  AFL cycle length was 390 msec  . Automatic implantable cardioverter-defibrillator in situ    a. St Jude in 2011.  Marland Kitchen Chronic anticoagulation   . Chronic systolic congestive heart failure (HCC)    a. suspected NICM - dx 2010. EF 15% by TEE, 10-20% by echo at that time. b. s/p St. Jude AICD 2011. c. Echo 01/2010: mod dilated LV, EF 30%, mod aortic root dilitation, no significant valvular disease.  . Eczema   . Eczema   . Hypertension   . Mobitz type 2 second degree atrioventricular block    a. During sleeping hours in 2010 suspected due to ? OSA.  . Morbid obesity (HCC)   . Nonischemic cardiomyopathy (HCC)   . PAF (paroxysmal atrial fibrillation) (HCC)   . Paroxysmal atrial flutter [facility])    s/p ablation    Past Surgical History:  Procedure Laterality Date  . ATRIAL FLUTTER ABLATION N/A 01/18/2014   Procedure: ATRIAL FLUTTER ABLATION;  Surgeon: Duke Salvia, MD;  Location: [facility] CATH LAB;  Service: Cardiovascular;  Laterality: N/A;  . CARDIAC  DEFIBRILLATOR PLACEMENT  02/17/10   St[employer] Jude Medical 45DR, model number [id number], serial number [id number]  . CARDIOVERSION N/A 12/29/2013   Procedure: CARDIOVERSION;  Surgeon: Duke Salvia, MD;  Location: [facility] OR;  Service: Cardiovascular;  Laterality: N/A;  . TOOTH EXTRACTION N/A 10/23/2012   Procedure: EXTRACTION TEETH 1, 16, 17, 30, 31;  Surgeon: [name], DDS;  Location: [facility] OR;  Service: Oral Surgery;  Laterality: N/A;    Current Medications: Current Meds  Medication Sig  . atorvastatin (LIPITOR) 40 MG tablet Take 1 tablet (40 mg total) by mouth daily with breakfast.  . buPROPion (WELLBUTRIN XL) 150 MG 24 hr tablet TAKE 1  TABLET BY MOUTH ONCE DAILY  . carvedilol (COREG) 25 MG tablet Take 1.5 tablets (37.5 mg total) by mouth 2 (two) times daily with a meal.  . citalopram (CELEXA) 20 MG tablet TAKE 1 TABLET BY MOUTH ONCE DAILY  . cyclobenzaprine (FLEXERIL) 10 MG tablet Take 1 tablet (10 mg total) by mouth 3 (three) times daily as needed for muscle spasms.  . digoxin (LANOXIN) 0.125 MG tablet TAKE 1 TABLET BY MOUTH EVERY OTHER DAY  . ENTRESTO 49-51 MG TAKE 1 TABLET BY MOUTH TWICE DAILY  . fluticasone (FLONASE) 50 MCG/ACT nasal spray Place 1 spray into both nostrils as needed.  . furosemide (LASIX) 20 MG tablet Take 1 tablet (20 mg total) by mouth daily.  Marland Kitchen guaiFENesin-codeine 100-10 MG/5ML syrup Take 5 mLs by mouth every 6 (six) hours as needed for cough.  . triamcinolone cream (KENALOG) 0.1 % Apply topically 2 (two) times daily as needed.  . warfarin (COUMADIN) 5 MG tablet TAKE 1 TABLET BY MOUTH ONCE DAILY EXCEPT  TAKE  1  &  1/2  TABLET  EACH  FRIDAY  OR  AS  DIRECTED  BY  ANTICOAGULATION  CLINIC  . [DISCONTINUED] carvedilol (COREG) 25 MG tablet Take 1 tablet (25 mg total) by mouth 2 (two) times daily with a meal.  . [DISCONTINUED] spironolactone (ALDACTONE) 25 MG tablet Take 12.5 mg by mouth daily.     Allergies:   Patient has no known allergies.   Social History   Socioeconomic History  . Marital status: Married    Spouse name: Not on file  . Number of children: Not on file  . Years of education: Not on file  . Highest education level: Not on file  Occupational History  . Not on file  Social Needs  . Financial resource strain: Not on file  . Food insecurity:    Worry: Not on file    Inability: Not on file  . Transportation needs:    Medical: Not on file    Non-medical: Not on file  Tobacco Use  . Smoking status: Never Smoker  . Smokeless tobacco: Never Used  Substance and Sexual Activity  . Alcohol use: No  . Drug use: No  . Sexual activity: Yes  Lifestyle  . Physical activity:    Days per  week: Not on file    Minutes per session: Not on file  . Stress: Not on file  Relationships  . Social connections:    Talks on phone: Not on file    Gets together: Not on file    Attends religious service: Not on file    Active member of club or organization: Not on file    Attends meetings of clubs or organizations: Not on file    Relationship status: Not on file  Other Topics Concern  . Not  on file  Social History Narrative  . Not on file     Family History: The patient's family history includes Cancer (age of onset: 58) in his brother; Diabetes in his paternal grandmother; Heart disease in his maternal grandfather and mother.  ROS:   Please see the history of present illness.     All other systems reviewed and are negative.  EKGs/Labs/Other Studies Reviewed:    The following studies were reviewed today:  Echo 07/31/15: Study Conclusions - Left ventricle: The cavity size was mildly dilated. Wall   thickness was increased in a pattern of mild LVH. Systolic   function was moderately reduced. The estimated ejection fraction   was in the range of 35% to 40%. Diffuse hypokinesis. The study is   not technically sufficient to allow evaluation of LV diastolic   function. - Aortic root: The aortic root was moderately dilated. - Left atrium: The atrium was mildly dilated. - Right ventricle: The cavity size was mildly dilated. - Pericardium, extracardiac: A trivial pericardial effusion was   identified.  Impressions: - Moderate global reduction in LV function; mild LVH and LVE; mild   LAE; mild RVE, trace TR. Aortic root moderately dilated (4.7 cm);   suggest CTA or MRA to further assess.   EKG:  EKG is ordered today.  The ekg ordered today demonstrates a sensed V paced, heart rate 60  Recent Labs: 07/08/2017: ALT 42 12/13/2017: BUN 8; Creatinine, Ser 0.77; Hemoglobin 12.0; Platelets 240; Potassium 4.1; Sodium 136  Recent Lipid Panel    Component Value Date/Time   CHOL  112 07/08/2017 0859   TRIG 64 07/08/2017 0859   HDL 34 (L) 07/08/2017 0859   CHOLHDL 3.3 07/08/2017 0859   CHOLHDL 5.3 03/20/2009 0410   VLDL 22 03/20/2009 0410   LDLCALC 65 07/08/2017 0859   LDLDIRECT 52 03/26/2015 0824    Physical Exam:    VS:  BP 117/70   Pulse 60   Ht 6\' 5"  (1.956 m)   Wt (!) 442 lb (200.5 kg)   BMI 52.41 kg/m     Wt Readings from Last 3 Encounters:  01/11/18 (!) 442 lb (200.5 kg)  12/13/17 (!) 444 lb 12.8 oz (201.8 kg)  10/12/17 (!) 448 lb 12.8 oz (203.6 kg)     GEN: morbidly obese AA male in no acute distress HEENT: Normal NECK: No JVD; No carotid bruits CARDIAC: RRR, no murmurs, rubs, gallops RESPIRATORY:  Clear to auscultation without rales, wheezing or rhonchi  ABDOMEN: Soft, non-tender, non-distended MUSCULOSKELETAL:  No edema; No deformity  SKIN: Warm and dry NEUROLOGIC:  Alert and oriented x 3 PSYCHIATRIC:  Normal affect   ASSESSMENT:    1. Atrial tachycardia (HCC)   2. Paroxysmal atrial fibrillation (HCC)   3. Non-ischemic cardiomyopathy (HCC)   4. Chronic systolic congestive heart failure (HCC)   5. Essential hypertension   6. Dizziness on standing    PLAN:    In order of problems listed above:  Atrial tachycardia (HCC) - Plan: EKG 12-Lead, carvedilol (COREG) 25 MG tablet, metoprolol tartrate (LOPRESSOR) 25 MG tablet, Ambulatory referral to Cardiac Electrophysiology, Basic metabolic panel, Magnesium, Digoxin level Device interrogation reviewed by Dr. Salena Saner and showed atrial tachycardia throughout the day on Sunday, 01/08/2018.  Rates were generally in the 150s but maximum rate was 199.  This may have been in response to decreasing his Coreg at his last visit.  Because he continues to have dizziness upon standing, we will discontinue his spironolactone and increase his Coreg  back to his 37.5 mg twice daily dose.  All other medications will stay the same.  We discussed pill in the pocket using Lopressor 12.5 mg.  I instructed him that he could  take a half a tablet and if he still had palpitations and her heart rate an hour later he can take another half of the Lopressor.  We discussed when he should call the office and when he should report to the ER.  We will collect a BMP, magnesium, and digoxin level.  We will also refer him to EP for better management of his paroxysmal atrial tachycardia.  Paroxysmal atrial fibrillation (HCC) No atrial fibrillation on device interrogation.  Non-ischemic cardiomyopathy (HCC) Chronic systolic congestive heart failure (HCC) He is euvolemic on exam.  He is working on his diet and has cut out pasta and breads in addition to sodas.  He is trying to walk on the treadmill for 5 minutes every other day.  Essential hypertension Dizziness on standing This is a difficult situation.  We are trying to titrate his heart failure medication while avoiding orthostatic hypotension.  Will DC spironolactone for now.  Increase Coreg back to 37.5 mg twice daily.  All other medications the same.   Referral to EP.  Follow-up with Dr. Salena Saner in 4 to 6 weeks.  Sooner if needed.   Medication Adjustments/Labs and Tests Ordered: Current medicines are reviewed at length with the patient today.  Concerns regarding medicines are outlined above.  Orders Placed This Encounter  Procedures  . Basic metabolic panel  . Magnesium  . Digoxin level  . Ambulatory referral to Cardiac Electrophysiology  . EKG 12-Lead   Meds ordered this encounter  Medications  . carvedilol (COREG) 25 MG tablet    Sig: Take 1.5 tablets (37.5 mg total) by mouth 2 (two) times daily with a meal.    Dispense:  270 tablet    Refill:  3    No refill needed , patient will call when needed  . metoprolol tartrate (LOPRESSOR) 25 MG tablet    Sig: Take 1 tablet as needed for palpitations    Dispense:  90 tablet    Refill:  3    Signed, Marcelino Duster, [place]  01/11/2018 10:23 AM    [facility]  I have seen and examined the  patient along with Marcelino Duster, PA.  I have reviewed the chart, notes and new data.  I agree with PA's note.  Key new complaints: Felt very poorly over the weekend and had a racing heartbeat, dizziness, weakness, worsening shortness of breath. Key examination changes: Physical exam is limited by super obesity, but he does appear to be possibly mildly hypervolemic.   Key new findings / data:  On September 1 and 2 his device recorded recurrent episodes of supraventricular tachycardia, rates 155-199, regular appearance, generally with 1: 1 AV conduction, sometimes with variable AV block.  PLAN: At this point I think we need to increase his carvedilol dose again, but will decrease the spironolactone to try to avoid recurrent problems with hypotension.  Check for electrolyte abnormalities and digital level.  I think we should refer him back to EP.  He has had a previous ablation for atypical atrial flutter and he may need another ablation procedure.  Hard to decide on any safe antiarrhythmic medications in this 37 year old man with decreased left ventricular systolic function.  It is also probably wise to reevaluate his left ventricular systolic function with an echocardiogram.  Thurmon Fair, MD, [facility] [phone] 01/11/2018, 4:03 PM

## 2018-01-11 ENCOUNTER — Encounter: Payer: Self-pay | Admitting: Physician Assistant

## 2018-01-11 ENCOUNTER — Ambulatory Visit (INDEPENDENT_AMBULATORY_CARE_PROVIDER_SITE_OTHER): Payer: Medicare Other | Admitting: Physician Assistant

## 2018-01-11 VITALS — BP 117/70 | HR 60 | Ht 77.0 in | Wt >= 6400 oz

## 2018-01-11 DIAGNOSIS — I10 Essential (primary) hypertension: Secondary | ICD-10-CM | POA: Diagnosis not present

## 2018-01-11 DIAGNOSIS — I480 Paroxysmal atrial fibrillation: Secondary | ICD-10-CM

## 2018-01-11 DIAGNOSIS — I5022 Chronic systolic (congestive) heart failure: Secondary | ICD-10-CM

## 2018-01-11 DIAGNOSIS — I428 Other cardiomyopathies: Secondary | ICD-10-CM | POA: Diagnosis not present

## 2018-01-11 DIAGNOSIS — I471 Supraventricular tachycardia: Secondary | ICD-10-CM

## 2018-01-11 DIAGNOSIS — R42 Dizziness and giddiness: Secondary | ICD-10-CM | POA: Diagnosis not present

## 2018-01-11 LAB — BASIC METABOLIC PANEL
BUN / CREAT RATIO: 13 (ref 9–20)
BUN: 11 mg/dL (ref 6–20)
CALCIUM: 9.1 mg/dL (ref 8.7–10.2)
CHLORIDE: 98 mmol/L (ref 96–106)
CO2: 20 mmol/L (ref 20–29)
Creatinine, Ser: 0.85 mg/dL (ref 0.76–1.27)
GFR calc Af Amer: 130 mL/min/{1.73_m2} (ref >59)
GFR, EST NON AFRICAN AMERICAN: 112 mL/min/{1.73_m2} (ref >59)
Glucose: 110 mg/dL — ABNORMAL HIGH (ref 65–99)
Potassium: 4.3 mmol/L (ref 3.5–5.2)
Sodium: 134 mmol/L (ref 134–144)

## 2018-01-11 LAB — DIGOXIN LEVEL: Digoxin, Serum: <0.4 ng/mL — ABNORMAL LOW (ref 0.5–0.9)

## 2018-01-11 LAB — MAGNESIUM: Magnesium: 2 mg/dL (ref 1.6–2.3)

## 2018-01-11 MED ORDER — METOPROLOL TARTRATE 25 MG PO TABS: ORAL_TABLET | ORAL | 3 refills | Status: DC

## 2018-01-11 MED ORDER — CARVEDILOL 25 MG PO TABS: 37.5000 mg | ORAL_TABLET | Freq: Two times a day (BID) | ORAL | 3 refills | Status: DC

## 2018-01-11 NOTE — Progress Notes (Signed)
please schedule him for a follow-up echocardiogram.  Reason atrial tachycardia, chronic systolic heart failure.

## 2018-01-11 NOTE — Patient Instructions (Signed)
Medication Instructions:   STOP SPIRONOLACTONE  INCREASE CARVEDILOL TO 37.5 MF TWICE DAILY= 1 AND 1/2 OF THE 12.5 MG TABLET TWICE DAILY  USE LOPRESSOR 25 MG AS NEEDED FOR PALPITATIONS  Labwork:  Your physician recommends that you HAVE LAB WORK TODAY  Follow-Up:  Your physician recommends that you schedule a follow-up appointment in: 4-6 WEEKS WITH DR CROITORU   REFERRAL TO EP FOR ATRIAL [facility]

## 2018-01-12 ENCOUNTER — Other Ambulatory Visit: Payer: Self-pay | Admitting: Family Medicine

## 2018-01-12 DIAGNOSIS — I480 Paroxysmal atrial fibrillation: Secondary | ICD-10-CM

## 2018-01-12 DIAGNOSIS — Z7901 Long term (current) use of anticoagulants: Secondary | ICD-10-CM

## 2018-01-13 ENCOUNTER — Telehealth: Payer: Self-pay

## 2018-01-13 ENCOUNTER — Encounter: Payer: Self-pay | Admitting: Cardiology

## 2018-01-13 MED ORDER — WARFARIN SODIUM 5 MG PO TABS: ORAL_TABLET | ORAL | 0 refills | Status: DC

## 2018-01-13 MED ORDER — BUPROPION HCL ER (XL) 150 MG PO TB24: 150.0000 mg | ORAL_TABLET | Freq: Every day | ORAL | 0 refills | Status: DC

## 2018-01-13 MED ORDER — ALPRAZOLAM 1 MG PO TABS: 1.0000 mg | ORAL_TABLET | Freq: Three times a day (TID) | ORAL | 0 refills | Status: DC | PRN

## 2018-01-13 NOTE — Telephone Encounter (Signed)
See patient e-mail.

## 2018-01-16 ENCOUNTER — Telehealth: Payer: Self-pay

## 2018-01-16 DIAGNOSIS — I471 Supraventricular tachycardia: Secondary | ICD-10-CM

## 2018-01-16 DIAGNOSIS — I5022 Chronic systolic (congestive) heart failure: Secondary | ICD-10-CM

## 2018-01-16 NOTE — Telephone Encounter (Signed)
Echo ordered. Message sent to admin for scheduling.

## 2018-01-16 NOTE — Telephone Encounter (Signed)
Croitoru, Rachelle Hora, MD at 01/11/2018 8:30 AM   Status: Signed     please schedule him for a follow-up echocardiogram.  Reason atrial tachycardia, chronic systolic heart failure.

## 2018-01-26 ENCOUNTER — Ambulatory Visit (INDEPENDENT_AMBULATORY_CARE_PROVIDER_SITE_OTHER): Payer: Medicaid Other | Admitting: Pharmacist Clinician (PhC)/ Clinical Pharmacy Specialist

## 2018-01-26 DIAGNOSIS — Z7901 Long term (current) use of anticoagulants: Secondary | ICD-10-CM

## 2018-01-26 DIAGNOSIS — I480 Paroxysmal atrial fibrillation: Secondary | ICD-10-CM | POA: Diagnosis not present

## 2018-01-26 LAB — POCT INR: INR: 4 — AB (ref 2.0–3.0)

## 2018-01-26 NOTE — Progress Notes (Signed)
Electrophysiology Office Note   Date:  01/27/2018   ID:  Danny Hinton, DOB Nov 21, 1980, MRN [id number]  PCP:  Dettinger, Elige Radon, MD  Cardiologist:  [facility] Primary Electrophysiologist:  Diva Lemberger Jorja Loa, MD    No chief complaint on file.    History of Present Illness: Danny Hinton is a 37 y.o. male who is being seen today for the evaluation of atrial tachycardia at the request of Micah Flesher. Presenting today for electrophysiology evaluation.  History of paroxysmal atrial fibrillation, atypical atrial flutter status post ablation in 2015, chronic systolic heart failure due to nonischemic cardiomyopathy status post ICD, hypertension, hyperlipidemia, and obesity.  He is anticoagulated on Coumadin.  He has NYHA class III heart failure.  He is morbidly obese, weighing 444 pounds.  He had has been short of breath.  He is also been having dizziness on standing.  He initially thought that this was due to atrial fibrillation, but device interrogation shows what appears to be atrial tachycardia with heart rates in the 150s to 190s.    Today, he denies symptoms of chest pain, shortness of breath, orthopnea, PND, lower extremity edema, claudication, dizziness, presyncope, syncope, bleeding, or neurologic sequela. The patient is tolerating medications without difficulties.  Continues to have episodes of palpitations.  He feels weak and fatigued when this occurs.  He has to lay down when it happens.  There are no exacerbating or alleviating factors.  He does snore and per his wife, he stops breathing when he sleeps.  He also has weakness and fatigue during the daytime.   Past Medical History:  Diagnosis Date  . Allergy   . Anxiety   . Atypical atrial flutter (HCC) 08/19/14   pace terminated in device clinic.  AFL cycle length was 390 msec  . Automatic implantable cardioverter-defibrillator in situ    a. St Jude in 2011.  Marland Kitchen Chronic anticoagulation   . Chronic systolic congestive heart failure  (HCC)    a. suspected NICM - dx 2010. EF 15% by TEE, 10-20% by echo at that time. b. s/p St. Jude AICD 2011. c. Echo 01/2010: mod dilated LV, EF 30%, mod aortic root dilitation, no significant valvular disease.  . Eczema   . Eczema   . Hypertension   . Mobitz type 2 second degree atrioventricular block    a. During sleeping hours in 2010 suspected due to ? OSA.  . Morbid obesity (HCC)   . Nonischemic cardiomyopathy (HCC)   . PAF (paroxysmal atrial fibrillation) (HCC)   . Paroxysmal atrial flutter [facility])    s/p ablation   Past Surgical History:  Procedure Laterality Date  . ATRIAL FLUTTER ABLATION N/A 01/18/2014   Procedure: ATRIAL FLUTTER ABLATION;  Surgeon: Duke Salvia, MD;  Location: [facility] CATH LAB;  Service: Cardiovascular;  Laterality: N/A;  . CARDIAC DEFIBRILLATOR PLACEMENT  02/17/10   St. Jude Medical 45DR, model number [id number], serial number [id number]  . CARDIOVERSION N/A 12/29/2013   Procedure: CARDIOVERSION;  Surgeon: Duke Salvia, MD;  Location: [facility] OR;  Service: Cardiovascular;  Laterality: N/A;  . TOOTH EXTRACTION N/A 10/23/2012   Procedure: EXTRACTION TEETH 1, 16, 17, 30, 31;  Surgeon: [name], DDS;  Location: [facility] OR;  Service: Oral Surgery;  Laterality: N/A;     Current Outpatient Medications  Medication Sig Dispense Refill  . ALPRAZolam (XANAX) 1 MG tablet Take 1 tablet (1 mg total) by mouth 3 (three) times daily as needed for anxiety. 30 tablet 0  .  atorvastatin (LIPITOR) 40 MG tablet Take 1 tablet (40 mg total) by mouth daily with breakfast. 90 tablet 3  . buPROPion (WELLBUTRIN XL) 150 MG 24 hr tablet Take 1 tablet (150 mg total) by mouth daily. 90 tablet 0  . carvedilol (COREG) 25 MG tablet Take 1.5 tablets (37.5 mg total) by mouth 2 (two) times daily with a meal. 270 tablet 3  . citalopram (CELEXA) 20 MG tablet TAKE 1 TABLET BY MOUTH ONCE DAILY 90 tablet 2  . cyclobenzaprine (FLEXERIL) 10 MG tablet Take 1 tablet (10 mg total) by mouth 3 (three) times daily as  needed for muscle spasms. 90 tablet 0  . digoxin (LANOXIN) 0.125 MG tablet TAKE 1 TABLET BY MOUTH EVERY OTHER DAY 45 tablet 2  . ENTRESTO 49-51 MG TAKE 1 TABLET BY MOUTH TWICE DAILY 180 tablet 1  . fluticasone (FLONASE) 50 MCG/ACT nasal spray Place 1 spray into both nostrils as needed. 16 g 11  . furosemide (LASIX) 20 MG tablet Take 1 tablet (20 mg total) by mouth daily. 90 tablet 3  . guaiFENesin-codeine 100-10 MG/5ML syrup Take 5 mLs by mouth every 6 (six) hours as needed for cough. 100 mL 0  . metoprolol tartrate (LOPRESSOR) 25 MG tablet Take 1 tablet as needed for palpitations 90 tablet 3  . triamcinolone cream (KENALOG) 0.1 % Apply topically 2 (two) times daily as needed. 453.6 g 2  . warfarin (COUMADIN) 5 MG tablet TAKE 1 TABLET BY MOUTH ONCE DAILY EXCEPT  TAKE  1  &  1/2  TABLET  EACH  FRIDAY  OR  AS  DIRECTED  BY  ANTICOAGULATION  CLINIC 100 tablet 0   No current facility-administered medications for this visit.     Allergies:   Patient has no known allergies.   Social History:  The patient  reports that he has never smoked. He has never used smokeless tobacco. He reports that he does not drink alcohol or use drugs.   Family History:  The patient's family history includes Cancer (age of onset: 9) in his brother; Diabetes in his paternal grandmother; Heart disease in his maternal grandfather and mother.    ROS:  Please see the history of present illness.   Otherwise, review of systems is positive for fatigue, chest pain, palpitations, shortness of breath, snoring, wheezing, anxiety, depression, balance problems, headaches, dizziness.   All other systems are reviewed and negative.    PHYSICAL EXAM: VS:  BP 124/72   Pulse 65   Ht 6\' 5"  (1.956 m)   Wt (!) 447 lb (202.8 kg)   BMI 53.01 kg/m  , BMI Body mass index is 53.01 kg/m. GEN: Well nourished, well developed, in no acute distress  HEENT: normal  Neck: no JVD, carotid bruits, or masses Cardiac: RRR; no murmurs, rubs, or  gallops,no edema  Respiratory:  clear to auscultation bilaterally, normal work of breathing GI: soft, nontender, nondistended, + BS MS: no deformity or atrophy  Skin: warm and dry,  device pocket is well healed Neuro:  Strength and sensation are intact Psych: euthymic mood, full affect  EKG:  EKG is not ordered today. Personal review of the ekg ordered 01/11/18 shows A paced, rate 60  Device interrogation is reviewed today in detail.  See PaceArt for details.   Recent Labs: 07/08/2017: ALT 42 12/13/2017: Hemoglobin 12.0; Platelets 240 01/11/2018: BUN 11; Creatinine, Ser 0.85; Magnesium 2.0; Potassium 4.3; Sodium 134    Lipid Panel     Component Value Date/Time   CHOL  112 07/08/2017 0859   TRIG 64 07/08/2017 0859   HDL 34 (L) 07/08/2017 0859   CHOLHDL 3.3 07/08/2017 0859   CHOLHDL 5.3 03/20/2009 0410   VLDL 22 03/20/2009 0410   LDLCALC 65 07/08/2017 0859   LDLDIRECT 52 03/26/2015 0824     Wt Readings from Last 3 Encounters:  01/27/18 (!) 447 lb (202.8 kg)  01/11/18 (!) 442 lb (200.5 kg)  12/13/17 (!) 444 lb 12.8 oz (201.8 kg)      Other studies Reviewed: Additional studies/ records that were reviewed today include: TTE 07/2015  Review of the above records today demonstrates:  - Left ventricle: The cavity size was mildly dilated. Wall   thickness was increased in a pattern of mild LVH. Systolic   function was moderately reduced. The estimated ejection fraction   was in the range of 35% to 40%. Diffuse hypokinesis. The study is   not technically sufficient to allow evaluation of LV diastolic   function. - Aortic root: The aortic root was moderately dilated. - Left atrium: The atrium was mildly dilated. - Right ventricle: The cavity size was mildly dilated. - Pericardium, extracardiac: A trivial pericardial effusion was   identified.   ASSESSMENT AND PLAN:  1.  Atrial tachycardia: Had been having dizziness and thus his Coreg was reduced.  He has a pill in the pocket  Lopressor.  Continue to have episodes of atrial tachycardia.  He has weakness and fatigue when he has these episodes.  I talked to him about medical management with dofetilide.  He would like to think about this and back.  2.  Paroxysmal atrial fibrillation: Currently on Coumadin.  No further episodes.  No changes.  This patients CHA2DS2-VASc Score and unadjusted Ischemic Stroke Rate (% per year) is equal to 2.2 % stroke rate/year from a score of 2  Above score calculated as 1 point each if present [CHF, HTN, DM, Vascular=MI/PAD/Aortic Plaque, Age if 65-74, or Male] Above score calculated as 2 points each if present [Age > 75, or Stroke/TIA/TE]  3.  Nonischemic cardiomyopathy: This post [facility] Jude ICD.  Currently on optimal medical therapy with carvedilol and Entresto.  No obvious signs of volume overload.  4.  Essential hypertension with dizziness on standing: Plan per primary cardiology.  5.  Snoring: Likely due to obstructive sleep apnea.  He has been tested in the past but has gained approximately 100 pounds since that time.  Koby Hartfield reorder a sleep study.  X.  Morbid obesity: I discussed with him weight loss options.  He is potentially interested in weight loss surgery.  We Aracelia Brinson give him phone numbers to call for that. Current medicines are reviewed at length with the patient today.   The patient does not have concerns regarding his medicines.  The following changes were made today:  none  Labs/ tests ordered today include:  No orders of the defined types were placed in this encounter.  Discussed with primary cardiology  Disposition:   FU with Anays Detore 3 months  Signed, Esvin Hnat Jorja Loa, MD  01/27/2018 10:37 AM     [facility] [address] [place] [address] [phone] (office) [phone] (fax)

## 2018-01-27 ENCOUNTER — Telehealth: Payer: Self-pay | Admitting: *Deleted

## 2018-01-27 ENCOUNTER — Ambulatory Visit (HOSPITAL_COMMUNITY): Payer: Medicaid Other | Attending: Cardiology

## 2018-01-27 ENCOUNTER — Other Ambulatory Visit: Payer: Self-pay

## 2018-01-27 ENCOUNTER — Encounter: Payer: Self-pay | Admitting: Cardiology

## 2018-01-27 ENCOUNTER — Ambulatory Visit: Payer: Medicare Other | Admitting: Cardiology

## 2018-01-27 VITALS — BP 124/72 | HR 65 | Ht 77.0 in | Wt >= 6400 oz

## 2018-01-27 DIAGNOSIS — I480 Paroxysmal atrial fibrillation: Secondary | ICD-10-CM | POA: Diagnosis not present

## 2018-01-27 DIAGNOSIS — R0683 Snoring: Secondary | ICD-10-CM

## 2018-01-27 DIAGNOSIS — I371 Nonrheumatic pulmonary valve insufficiency: Secondary | ICD-10-CM | POA: Insufficient documentation

## 2018-01-27 DIAGNOSIS — I471 Supraventricular tachycardia: Secondary | ICD-10-CM | POA: Insufficient documentation

## 2018-01-27 DIAGNOSIS — I10 Essential (primary) hypertension: Secondary | ICD-10-CM

## 2018-01-27 DIAGNOSIS — G4733 Obstructive sleep apnea (adult) (pediatric): Secondary | ICD-10-CM

## 2018-01-27 DIAGNOSIS — I4891 Unspecified atrial fibrillation: Secondary | ICD-10-CM | POA: Diagnosis not present

## 2018-01-27 DIAGNOSIS — I428 Other cardiomyopathies: Secondary | ICD-10-CM

## 2018-01-27 DIAGNOSIS — R0681 Apnea, not elsewhere classified: Secondary | ICD-10-CM

## 2018-01-27 DIAGNOSIS — I110 Hypertensive heart disease with heart failure: Secondary | ICD-10-CM | POA: Insufficient documentation

## 2018-01-27 DIAGNOSIS — I081 Rheumatic disorders of both mitral and tricuspid valves: Secondary | ICD-10-CM | POA: Insufficient documentation

## 2018-01-27 DIAGNOSIS — I429 Cardiomyopathy, unspecified: Secondary | ICD-10-CM | POA: Insufficient documentation

## 2018-01-27 DIAGNOSIS — I5022 Chronic systolic (congestive) heart failure: Secondary | ICD-10-CM | POA: Diagnosis not present

## 2018-01-27 DIAGNOSIS — R400 Somnolence: Secondary | ICD-10-CM

## 2018-01-27 NOTE — Telephone Encounter (Signed)
Staff message sent to [place]. Ok to schedule sleep study, patient has [employer]. Does not require a PA.

## 2018-01-27 NOTE — Telephone Encounter (Signed)
-----   Message from Baird Lyons, RN sent at 01/27/2018 11:08 AM EDT ----- Regarding: Sleep study Sleep study referral placed in Epic.  thx :)

## 2018-01-27 NOTE — Patient Instructions (Addendum)
Medication Instructions:  Your physician recommends that you continue on your current medications as directed. Please refer to the Current Medication list given to you today.  *If you need a refill on your cardiac medications before your next appointment, please call your pharmacy*  Labwork: None ordered  Testing/Procedures: Your physician has recommended that you have a sleep study. This test records several body functions during sleep, including: brain activity, eye movement, oxygen and carbon dioxide blood levels, heart rate and rhythm, breathing rate and rhythm, the flow of air through your mouth and nose, snoring, body muscle movements, and chest and belly movement.  The office will call you to arrange this testing   Follow-Up: Remote monitoring is used to monitor your Pacemaker or ICD from home. This monitoring reduces the number of office visits required to check your device to one time per year. It allows [place] to keep an eye on the functioning of your device to ensure it is working properly. You are scheduled for a device check from home on 01/31/2018. You may send your transmission at any time that day. If you have a wireless device, the transmission will be sent automatically. After your physician reviews your transmission, you will receive a postcard with your next transmission date.  Your physician recommends that you schedule a follow-up appointment in: 3 months with Dr. Elberta Fortis.  Thank you for choosing [facility]!!   Dory Horn, RN [phone]  Any Other Special Instructions Will Be Listed Below (If Applicable). Please call the office next week to let the nurse know if you would like to start Tikosyn.  Call Dory Horn, RN @ [phone]  Bariatric Surgery You have so much to gain by losing weight.  You may have already tried every diet and exercise plan imaginable.  And, you may have sought advice from your family physician, too.   Sometimes, in spite of such  diligent efforts, you may not be able to achieve long-term results by yourself.  In cases of severe obesity, bariatric or weight loss surgery is a proven method of achieving long-term weight control.  Our Services Our bariatric surgery programs offer our patients new hope and long-term weight-loss solution.  Since introducing our services in 2003, we have conducted more than 2,400 successful procedures.  Our program is designated as a Investment banker, corporate by the Metabolic and Bariatric Surgery Accreditation and Quality Improvement Program (MBSAQIP), a Child psychotherapist that sets rigorous patient safety and outcome standards.  Our program is also designated as a Engineer, manufacturing systems by [employer].   Our exceptional weight-loss surgery team specializes in diagnosis, treatment, follow-up care, and ongoing support for our patients with severe weight loss challenges.  We currently offer laparoscopic sleeve gastrectomy, gastric bypass, and adjustable gastric band (LAP-BAND).    Attend our Bariatrics Seminar Choosing to undergo a bariatric procedure is a big decision, and one that should not be taken lightly.  You now have two options in how you learn about weight-loss surgery - in person or online.  Our objective is to ensure you have all of the information that you need to evaluate the advantages and obligations of this life changing procedure.  Please note that you are not alone in this process, and our experienced team is ready to assist and answer all of your questions.  There are several ways to register for a seminar (either on-line or in person): 1)  Call [phone] 2) Go on-line to [facility] and register for either type  of seminar.  [email or web address]    Dofetilide capsules What is this medicine? DOFETILIDE (doe FET il ide) is an antiarrhythmic drug. It helps make your heart beat regularly. This medicine also helps to slow rapid  heartbeats. This medicine may be used for other purposes; ask your health care provider or pharmacist if you have questions. COMMON BRAND NAME(S): Tikosyn What should I tell my health care provider before I take this medicine? They need to know if you have any of these conditions: -heart disease -history of low levels of potassium or magnesium -kidney disease -liver disease -an unusual or allergic reaction to dofetilide, other medicines, foods, dyes, or preservatives -pregnant or trying to get pregnant -breast-feeding How should I use this medicine? Take this medicine by mouth with a glass of water. Follow the directions on the prescription label. You can take this medicine with or without food. Do not drink grapefruit juice with this medicine. Take your doses at regular intervals. Do not take your medicine more often than directed. Do not stop taking this medicine suddenly. This may cause serious, heart-related side effects. Your doctor will tell you how much medicine to take. If your doctor wants you to stop the medicine, the dose will be slowly lowered over time to avoid any side effects. Talk to your pediatrician regarding the use of this medicine in children. Special care may be needed. Overdosage: If you think you have taken too much of this medicine contact a poison control center or emergency room at once. NOTE: This medicine is only for you. Do not share this medicine with others. What if I miss a dose? If you miss a dose, take it as soon as you can. If it is almost time for your next dose, take only that dose. Do not take double or extra doses. What may interact with this medicine? Do not take this medicine with any of the following medications: -cimetidine -dolutegravir -hydrochlorothiazide alone or in combination with other medicines -isavuconazonium -ketoconazole -megestrol -other medicines that prolong the QT interval (cause an abnormal heart  rhythm) -prochlorperazine -trimethoprim alone or in combination with sulfamethoxazole -verapamil This medicine may also interact with the following medications: -amiloride -certain antidepressants like fluvoxamine or paroxetine -certain antiviral medicines for HIV or AIDS like atazanavir or darunavir -certain medicines for fungal infections like clotrimazole or miconazole -digoxin -diltiazem -dronabinol, THC -grapefruit juice -metformin -nefazodone -triamterene -zafirlukast This list may not describe all possible interactions. Give your health care provider a list of all the medicines, herbs, non-prescription drugs, or dietary supplements you use. Also tell them if you smoke, drink alcohol, or use illegal drugs. Some items may interact with your medicine. What should I watch for while using this medicine? Visit your doctor or health care professional for regular checks on your progress. Wear a medical ID bracelet or chain, and carry a card that describes your disease and details of your medicine and dosage times. Check your heart rate and blood pressure regularly while you are taking this medicine. Ask your doctor or health care professional what your heart rate and blood pressure should be, and when you should contact him or her. Your doctor or health care professional also may schedule regular tests to check your progress. You will be started on this medicine in a specialized facility for at least three days. You will be monitored to find the right dose of medicine for you. It is very important that you take your medicine exactly as prescribed when you leave the hospital. The  correct dosing of this medicine is very important to treat your condition and prevent possible serious side effects. What side effects may I notice from receiving this medicine? Side effects that you should report to your doctor or health care professional as soon as possible: -allergic reactions like skin rash,  itching or hives, swelling of the face, lips, or tongue -breathing problems -dizziness -fast or rapid beating of the heart -feeling faint or lightheaded -swelling of the ankles -unusually weak or tired -vomiting Side effects that usually do not require medical attention (report to your doctor or health care professional if they continue or are bothersome): -cough -diarrhea -difficulty sleeping -headache -nausea -stomach pain This list may not describe all possible side effects. Call your doctor for medical advice about side effects. You may report side effects to FDA at 1-800-FDA-1088. Where should I keep my medicine? Keep out of the reach of children. Store at room temperature between 15 and 30 degrees C (59 and 86 degrees F). Protect the medicine from moisture or humidity. Keep container tightly closed. Throw away any unused medicine after the expiration date. NOTE: This sheet is a summary. It may not cover all possible information. If you have questions about this medicine, talk to your doctor, pharmacist, or health care provider.  2018 Elsevier/Gold Standard (2016-03-08 16:10:96)   Sleep Study  [name] scheduled for a sleep study on ______ ______ _______ at ____. You will leave next morning between 5:00 a.m - 7:00 a.m.  The sleep study consists of a recording of your brain waves (EEG). Breathing, heart rate and rhythm (ECG), oxygen level, eye movement, and leg movement.  The technician will glue or or paste several electrodes to your scalp, face, chest and legs.  You will have belts around your chest and abdomen to record breathing and a finger clasp to check blood oxygen levels.  A tube at your mouth and nose will detect airflow.  There are no needle sticks or painful procedures of any sort.  You will have your own room, and we will make every effort to attend to your comfort and privacy.  Please prepare for your study by the following steps:   Please avoid coffee, tea, soda,  chocolate and other caffeine foods or beverages after 12:00 noon on the day of your sleep study.   You must arrive with clean (no oils), conditioners or make up, and please make sure that you wash your hair to ensure that your hair and scalp are clean, dry and free of any hair extensions on the day of your study.  This will help to get a good reading of study.  Please try not to nap on the day of your study.  Please bring a list of all your medications.  Bring any medications that you might need during the time you are within the laboratory, including insulin, sleeping pills, pain medication and anxiety medications.  Bring snacks, water or juice  Please bring clothes to sleep in and your normal overnight bag.  Please leave valuable at home, as we will not be responsible for any lost items.  If you have any further questions, please feel free to call our office. Thank you  *Please call our office 48 in advance to cancel or reschedule to avoid a $100.00 early cancel or no show fee*

## 2018-01-28 LAB — ECHOCARDIOGRAM COMPLETE
Height: 77 in
WEIGHTICAEL: 7152 [oz_av]

## 2018-01-30 ENCOUNTER — Telehealth: Payer: Self-pay | Admitting: *Deleted

## 2018-01-30 NOTE — Telephone Encounter (Signed)
Patient is scheduled for lab study on 02/14/18. Patient understands his sleep study will be done at [facility] sleep lab in [place]. Patient understands he will receive a sleep packet in a week or so. Patient understands to call if he does not receive the sleep packet in a timely manner. Patient agrees with treatment and thanked me for call.

## 2018-01-30 NOTE — Telephone Encounter (Signed)
-----   Message from Gaynelle Cage, CMA sent at 01/27/2018  1:50 PM EDT ----- Regarding: RE: Sleep study Ok to schedule sleep study. Patient has Medicaid and doesn't require  PA. ----- Message ----- From: Baird Lyons, RN Sent: 01/27/2018  11:08 AM EDT To: Baird Lyons, RN, [facility] Sleep Studies Subject: Sleep study                                    Sleep study referral placed in Epic.  thx :)

## 2018-01-31 ENCOUNTER — Telehealth: Payer: Self-pay

## 2018-01-31 ENCOUNTER — Encounter: Payer: Medicaid Other | Admitting: *Deleted

## 2018-01-31 NOTE — Telephone Encounter (Signed)
LMOVM reminding pt to send remote transmission.   

## 2018-02-01 ENCOUNTER — Encounter: Payer: Self-pay | Admitting: Cardiology

## 2018-02-01 ENCOUNTER — Telehealth: Payer: Self-pay | Admitting: Cardiology

## 2018-02-01 NOTE — Telephone Encounter (Signed)
Pt c/o medication issue: 1. Name of Medication:Tiksoyn  2. How are you currently taking this medication (dosage and times per day)? NA  3. Are you having a reaction (difficulty breathing--STAT)?  no 4. What is your medication issue? Would like to try [employer]

## 2018-02-02 MED ORDER — DOFETILIDE 500 MCG PO CAPS: 500.0000 ug | ORAL_CAPSULE | Freq: Two times a day (BID) | ORAL | 0 refills | Status: DC

## 2018-02-02 NOTE — Telephone Encounter (Signed)
Pt aware I will send Danny Hinton quote Rx to [facility].  He will call them to check on cost. Pt aware I will call him early next week to determine if affordable for him. He is agreeable to plan.

## 2018-02-08 ENCOUNTER — Encounter: Payer: Self-pay | Admitting: Pharmacist Clinician (PhC)/ Clinical Pharmacy Specialist

## 2018-02-09 NOTE — Telephone Encounter (Signed)
Pt tells me he called for cost but his Medicaid has not started yet, so he is considered self pay and cannot afford it. Pt states he is supposed to know something from [facility] by tomorrow about his medication coverage. Pt aware I will follow up with him next week to see what status is.  Also educated him about switching to a different anti-depressant if going to start Tikosyn.  He is aware he will need to stop Celexa in order to start Tikosyn.  We will discuss this further next week when he finds out about his Medicaid.

## 2018-02-14 ENCOUNTER — Ambulatory Visit: Payer: Medicaid Other | Attending: Cardiology | Admitting: Cardiology

## 2018-02-14 DIAGNOSIS — R0683 Snoring: Secondary | ICD-10-CM

## 2018-02-14 DIAGNOSIS — G4733 Obstructive sleep apnea (adult) (pediatric): Secondary | ICD-10-CM | POA: Insufficient documentation

## 2018-02-14 DIAGNOSIS — R0681 Apnea, not elsewhere classified: Secondary | ICD-10-CM

## 2018-02-14 DIAGNOSIS — R400 Somnolence: Secondary | ICD-10-CM

## 2018-02-15 ENCOUNTER — Other Ambulatory Visit: Payer: Self-pay | Admitting: Family Medicine

## 2018-02-15 ENCOUNTER — Telehealth: Payer: Self-pay | Admitting: Pharmacist Clinician (PhC)/ Clinical Pharmacy Specialist

## 2018-02-15 NOTE — Procedures (Signed)
   Patient Name: Danny Hinton, Danny Hinton Study Date: 02/14/2018 Gender: Male D.O.B: Dec 25, 1980 Age (years): 37 Referring Provider: Will Camnitz Height (inches): 78 Interpreting Physician: Fransico Him MD, ABSM Weight (lbs): 447 RPSGT: Rosebud Poles BMI: 52 MRN: [id number] Neck Size: 22.00  CLINICAL INFORMATION  Sleep Study Type: Split Night CPAP Indication for sleep study: Fatigue, OSA, Snoring Epworth Sleepiness Score: 10  SLEEP STUDY TECHNIQUE  As per the AASM Manual for the Scoring of Sleep and Associated Events v2.3 (April 2016) with a hypopnea requiring 4% desaturations. The channels recorded and monitored were frontal, central and occipital EEG, electrooculogram (EOG), submentalis EMG (chin), nasal and oral airflow, thoracic and abdominal wall motion, anterior tibialis EMG, snore microphone, electrocardiogram, and pulse oximetry. Continuous positive airway pressure (CPAP) was initiated when the patient met split night criteria and was titrated according to treat sleep-disordered breathing.  MEDICATIONS  Medications self-administered by patient taken the night of the study : N/A  RESPIRATORY PARAMETERS  Diagnostic Total AHI (/hr):67.2 RDI (/hr):69.0 OA Index (/hr):3 CA Index (/hr):0.0 REM AHI (/hr):N/A NREM AHI (/hr):67.2 Supine AHI (/hr):92.6 Non-supine AHI (/hr):54.6 Min O2 Sat (%):82.0 Mean O2 (%):90.1 Time below 88% (min):24.8  Titration Optimal Pressure (cm): AHI at Optimal Pressure (/hr):N/A Min O2 at Optimal Pressure (%):79.0 Supine % at Optimal (%):N/A Sleep % at Optimal (%):N/A  SLEEP ARCHITECTURE  The recording time for the entire night was 384.8 minutes. During a baseline period of 177.6 minutes, the patient slept for 138.3 minutes in REM and nonREM, yielding a sleep efficiency of 77.9%%. Sleep onset after lights out was 27.2 minutes with a REM latency of N/A minutes. The patient spent 6.9%% of the night in stage N1 sleep, 87.1%% in stage N2 sleep, 6.0%% in stage  N3 and 0% in REM.  During the titration period of 197.3 minutes, the patient slept for 152.6 minutes in REM and nonREM, yielding a sleep efficiency of 77.3%%. Sleep onset after CPAP initiation was 25.8 minutes with a REM latency of 61.0 minutes. The patient spent 5.9%% of the night in stage N1 sleep, 56.4%% in stage N2 sleep, 18.7%% in stage N3 and 19% in REM.  CARDIAC DATA  The 2 lead EKG demonstrated sinus rhythm. The mean heart rate was 100.0 beats per minute. Other EKG findings include: None.  LEG MOVEMENT DATA  The total Periodic Limb Movements of Sleep (PLMS) were 0. The PLMS index was 0.0 .  IMPRESSIONS  - Severe obstructive sleep apnea occurred during the diagnostic portion of the study (AHI = 67.2/hour). An optimal PAP pressure could not be selected for this patient based on the available study data. - No significant central sleep apnea occurred during the diagnostic portion of the study (CAI = 0.0/hour). - Severe oxygen desaturation was noted during the diagnostic portion of the study (Min O2 = 82.0%). - The patient snored with moderate snoring volume during the diagnostic portion of the study. - No cardiac abnormalities were noted during this study. - Clinically significant periodic limb movements did not occur during sleep.  DIAGNOSIS  - Obstructive Sleep Apnea (327.23 [G47.33 ICD-10])  RECOMMENDATIONS  - Recommend BiPAP titration given sub-optimal CPAP titration. - Avoid alcohol, sedatives and other CNS depressants that may worsen sleep apnea and disrupt normal sleep architecture. - Sleep hygiene should be reviewed to assess factors that may improve sleep quality. - Weight management and regular exercise should be initiated or continued.  [Electronically signed] 02/15/2018 01:25 PM  Fransico Him MD, ABSM Diplomate, American Board of Sleep Medicine

## 2018-02-15 NOTE — Telephone Encounter (Signed)
Spoke with patient, he was due for first home INR test last week.  States company called and had him hold off for now - they are still working on changes in his insurance.  Hopes to have straightened out by next week.

## 2018-02-16 ENCOUNTER — Telehealth: Payer: Self-pay | Admitting: *Deleted

## 2018-02-16 DIAGNOSIS — G4733 Obstructive sleep apnea (adult) (pediatric): Secondary | ICD-10-CM

## 2018-02-16 NOTE — Telephone Encounter (Signed)
-----   Message from Quintella Reichert, MD sent at 02/15/2018  1:29 PM EDT ----- Please let patient know that they have significant sleep apnea but had unsuccessful PAP titration due to severity of OSA.  Please set up in lab BiPAP titration

## 2018-02-16 NOTE — Telephone Encounter (Signed)
Informed patient of sleep study results and patient understanding was verbalized. Patient understands his sleep study showed they have significant sleep apnea but had unsuccessful PAP titration due to severity of OSA. Please set up in lab BiPAP titration.  Left detailed message on voicemail  and informed patient to call back.Marland Kitchen

## 2018-02-17 ENCOUNTER — Telehealth: Payer: Self-pay | Admitting: *Deleted

## 2018-02-17 LAB — POCT INR: INR: 1.9 — AB (ref 2.0–3.0)

## 2018-02-17 NOTE — Telephone Encounter (Signed)
-----   Message from Reesa Chew, CMA sent at 02/16/2018  5:37 PM EDT ----- Regarding: pre cert  had unsuccessful PAP titration due to severity of OSA. Please set up in lab BiPAP titration

## 2018-02-17 NOTE — Telephone Encounter (Signed)
Staff message sent to Danny Hinton ok to schedule BIPAP titration. Patient had Medicaid and will not need a PA done.

## 2018-02-20 ENCOUNTER — Telehealth: Payer: Self-pay | Admitting: *Deleted

## 2018-02-20 ENCOUNTER — Ambulatory Visit (INDEPENDENT_AMBULATORY_CARE_PROVIDER_SITE_OTHER): Payer: Medicaid Other | Admitting: Pharmacist Clinician (PhC)/ Clinical Pharmacy Specialist

## 2018-02-20 ENCOUNTER — Encounter: Payer: Self-pay | Admitting: *Deleted

## 2018-02-20 DIAGNOSIS — Z7901 Long term (current) use of anticoagulants: Secondary | ICD-10-CM | POA: Diagnosis not present

## 2018-02-20 DIAGNOSIS — I480 Paroxysmal atrial fibrillation: Secondary | ICD-10-CM

## 2018-02-20 NOTE — Addendum Note (Signed)
Addended by: Reesa Chew on: 02/20/2018 02:58 PM   Modules accepted: Orders

## 2018-02-20 NOTE — Telephone Encounter (Signed)
    Informed patient of sleep study results and patient understanding was verbalized. Patient understands his sleep study showed they have significant sleep apnea but had unsuccessful PAP titration due to severity of OSA. Please set up in lab BiPAP titration     Patient is scheduled for lab study on 03/29/18. Patient understands his sleep study will be done at [facility] sleep lab. Patient understands he will receive a sleep packet in a week or so. Patient understands to call if he does not receive the sleep packet in a timely manner. Patient agrees with treatment and thanked me for call

## 2018-02-22 ENCOUNTER — Encounter: Payer: Self-pay | Admitting: Cardiovascular Disease

## 2018-02-22 ENCOUNTER — Ambulatory Visit: Payer: Medicaid Other | Admitting: Cardiovascular Disease

## 2018-02-22 VITALS — BP 109/70 | HR 60 | Ht 77.0 in | Wt >= 6400 oz

## 2018-02-22 DIAGNOSIS — I10 Essential (primary) hypertension: Secondary | ICD-10-CM

## 2018-02-22 DIAGNOSIS — I471 Supraventricular tachycardia: Secondary | ICD-10-CM | POA: Diagnosis not present

## 2018-02-22 DIAGNOSIS — Z95810 Presence of automatic (implantable) cardiac defibrillator: Secondary | ICD-10-CM

## 2018-02-22 DIAGNOSIS — E785 Hyperlipidemia, unspecified: Secondary | ICD-10-CM | POA: Diagnosis not present

## 2018-02-22 DIAGNOSIS — I480 Paroxysmal atrial fibrillation: Secondary | ICD-10-CM

## 2018-02-22 DIAGNOSIS — I5042 Chronic combined systolic (congestive) and diastolic (congestive) heart failure: Secondary | ICD-10-CM

## 2018-02-22 DIAGNOSIS — I420 Dilated cardiomyopathy: Secondary | ICD-10-CM | POA: Diagnosis not present

## 2018-02-22 DIAGNOSIS — Z7901 Long term (current) use of anticoagulants: Secondary | ICD-10-CM | POA: Diagnosis not present

## 2018-02-22 NOTE — Progress Notes (Signed)
Patient ID: Danny Hinton, male   DOB: 09-03-1980, 37 y.o.   MRN: [id number]    Cardiology Office Note    Date:  02/22/2018   ID:  Danny Hinton, DOB [birth year], MRN [id number]  PCP:  Dettinger, Elige Radon, MD  Cardiologist:   Thurmon Fair, MD   No chief complaint on file.   History of Present Illness:  Danny Hinton is a 37 y.o. male who presents for nonischemic cardiomyopathy, chronic systolic heart failure, defibrillator checkup, history of atrial flutter, atrial fibrillation and paroxysmal atrial tachycardia.  About a month ago he had an episode of sustained atrial tachycardia 150-180 bpm lasting for about 12 hours due to heart failure exacerbation.  This has not happened since although he has had occasional palpitations that are brief and isolated.  Orthostatic hypotension is occurring less frequently than at his last appointment but is still an occasional issue.  He feels extreme fatigue all day long and falls asleep all the time.  His sleep study did show severe obstructive sleep apnea, but the CPAP titration was unsuccessful and he is going back for another session on November 20.  He is considering bariatric surgery seriously.  Continues to have NYHA functional class III dyspnea on exertion, but does not have edema, orthopnea or PND.  He sometimes wake up with a feeling of tightness in his chest but this improves as the morning goes on.  He has not had any falls, injuries or bleeding on chronic warfarin anticoagulation.  Dofetilide was sent in for prior authorization, but he is not taking that medication although it is on his list of medicines.  Interrogation of his device shows normal function. Estimated longevity is 1.4-2.0 years.  Presenting rhythm normal sinus alternating with atrial paced-ventricular sensed at about 60 bpm.  Atrial pacing occurs about 34% of the time no new episodes of atrial mode switch have occurred and he has not had either sustained or nonsustained VT.  Thoracic  impedance that has recently shown a little bit of volatility but he is back to baseline activity remains limited about 2 hours a day.  The day-night separation and average heart rate has been better over the last 6 months.    His recently performed echocardiogram showed an ejection fraction of around 40%, without evidence of acute fluid overload at the time.   In September 2015 he underwent cavotricuspid isthmus ablation for atrial flutter. On 08/19/2014, while [place] hunting he develop persistent rapid palpitations and felt unwell. He underwent successful overdrive pacing via his device by Dr. Hillis Range. The rhythm was atypical atrial flutter, cycle length roughly 390 ms. The episode lasted for about 3 hours until he was successfully overdrive paced. In the past he has had paroxysmal atrial fibrillation. The decision was made to ablate his flutter secondary to the occurrence of multiple unnecessary defibrillator shocks in the setting of atrial flutter with rapid ventricular rate.  His St. Jude Fortify DR model [phone] defibrillator is under advisory for possible unexpected battery depletion. He is not device dependent and has never had shocks for true ventricular arrhythmia (although he has had inappropriate shocks for atrial flutter with rapid AV conduction). We discussed the potential for device failure in a low percentage of this particular model. There is no reason to proceed with early generator change out in his particular case.   Past Medical History:  Diagnosis Date  . Allergy   . Anxiety   . Atypical atrial flutter (HCC) 08/19/14   pace  terminated in device clinic.  AFL cycle length was 390 msec  . Automatic implantable cardioverter-defibrillator in situ    a. St Jude in 2011.  Marland Kitchen Chronic anticoagulation   . Chronic systolic congestive heart failure (HCC)    a. suspected NICM - dx 2010. EF 15% by TEE, 10-20% by echo at that time. b. s/p St. Jude AICD 2011. c. Echo 01/2010: mod dilated  LV, EF 30%, mod aortic root dilitation, no significant valvular disease.  . Eczema   . Eczema   . Hypertension   . Mobitz type 2 second degree atrioventricular block    a. During sleeping hours in 2010 suspected due to ? OSA.  . Morbid obesity (HCC)   . Nonischemic cardiomyopathy (HCC)   . PAF (paroxysmal atrial fibrillation) (HCC)   . Paroxysmal atrial flutter [facility])    s/p ablation    Past Surgical History:  Procedure Laterality Date  . ATRIAL FLUTTER ABLATION N/A 01/18/2014   Procedure: ATRIAL FLUTTER ABLATION;  Surgeon: Duke Salvia, MD;  Location: [facility] CATH LAB;  Service: Cardiovascular;  Laterality: N/A;  . CARDIAC DEFIBRILLATOR PLACEMENT  02/17/10   St. Jude Medical 45DR, model number [id number], serial number [id number]  . CARDIOVERSION N/A 12/29/2013   Procedure: CARDIOVERSION;  Surgeon: Duke Salvia, MD;  Location: [facility] OR;  Service: Cardiovascular;  Laterality: N/A;  . TOOTH EXTRACTION N/A 10/23/2012   Procedure: EXTRACTION TEETH 1, 16, 17, 30, 31;  Surgeon: [name], DDS;  Location: [facility] OR;  Service: Oral Surgery;  Laterality: N/A;    Outpatient Medications Prior to Visit  Medication Sig Dispense Refill  . ALPRAZolam (XANAX) 1 MG tablet Take 1 tablet (1 mg total) by mouth 3 (three) times daily as needed for anxiety. 30 tablet 0  . atorvastatin (LIPITOR) 40 MG tablet Take 1 tablet (40 mg total) by mouth daily with breakfast. 90 tablet 0  . buPROPion (WELLBUTRIN XL) 150 MG 24 hr tablet Take 1 tablet (150 mg total) by mouth daily. 90 tablet 0  . carvedilol (COREG) 25 MG tablet Take 1.5 tablets (37.5 mg total) by mouth 2 (two) times daily with a meal. 270 tablet 3  . citalopram (CELEXA) 20 MG tablet TAKE 1 TABLET BY MOUTH ONCE DAILY 90 tablet 2  . cyclobenzaprine (FLEXERIL) 10 MG tablet Take 1 tablet (10 mg total) by mouth 3 (three) times daily as needed for muscle spasms. 90 tablet 0  . digoxin (LANOXIN) 0.125 MG tablet TAKE 1 TABLET BY MOUTH EVERY OTHER DAY 45 tablet 2  .  dofetilide (TIKOSYN) 500 MCG capsule Take 1 capsule (500 mcg total) by mouth 2 (two) times daily. 60 capsule 0  . ENTRESTO 49-51 MG TAKE 1 TABLET BY MOUTH TWICE DAILY 180 tablet 1  . fluticasone (FLONASE) 50 MCG/ACT nasal spray Place 1 spray into both nostrils as needed. 16 g 11  . furosemide (LASIX) 20 MG tablet Take 1 tablet (20 mg total) by mouth daily. 90 tablet 3  . guaiFENesin-codeine 100-10 MG/5ML syrup Take 5 mLs by mouth every 6 (six) hours as needed for cough. 100 mL 0  . metoprolol tartrate (LOPRESSOR) 25 MG tablet Take 1 tablet as needed for palpitations 90 tablet 3  . triamcinolone cream (KENALOG) 0.1 % Apply topically 2 (two) times daily as needed. 453.6 g 2  . warfarin (COUMADIN) 5 MG tablet TAKE 1 TABLET BY MOUTH ONCE DAILY EXCEPT  TAKE  1  &  1/2  TABLET  EACH  FRIDAY  OR  AS  DIRECTED  BY  ANTICOAGULATION  CLINIC 100 tablet 0   No facility-administered medications prior to visit.      Allergies:   Patient has no known allergies.   Social History   Socioeconomic History  . Marital status: Married    Spouse name: Not on file  . Number of children: Not on file  . Years of education: Not on file  . Highest education level: Not on file  Occupational History  . Not on file  Social Needs  . Financial resource strain: Not on file  . Food insecurity:    Worry: Not on file    Inability: Not on file  . Transportation needs:    Medical: Not on file    Non-medical: Not on file  Tobacco Use  . Smoking status: Never Smoker  . Smokeless tobacco: Never Used  Substance and Sexual Activity  . Alcohol use: No  . Drug use: No  . Sexual activity: Yes  Lifestyle  . Physical activity:    Days per week: Not on file    Minutes per session: Not on file  . Stress: Not on file  Relationships  . Social connections:    Talks on phone: Not on file    Gets together: Not on file    Attends religious service: Not on file    Active member of club or organization: Not on file     Attends meetings of clubs or organizations: Not on file    Relationship status: Not on file  Other Topics Concern  . Not on file  Social History Narrative  . Not on file     Family History:  The patient's family history includes Cancer (age of onset: 63) in his brother; Diabetes in his paternal grandmother; Heart disease in his maternal grandfather and mother.   ROS:   Please see the history of present illness.    ROS All other systems reviewed and are negative.   PHYSICAL EXAM:   VS:  BP 109/70   Pulse 60   Ht 6\' 5"  (1.956 m)   Wt (!) 449 lb 12.8 oz (204 kg)   BMI 53.34 kg/m     General: Alert, oriented x3, no distress, morbidly obese Head: no evidence of trauma, PERRL, EOMI, no exophtalmos or lid lag, no myxedema, no xanthelasma; normal ears, nose and oropharynx Neck: Obesity limits evaluation of jugular venous pulsations and no hepatojugular reflux; brisk carotid pulses without delay and no carotid bruits Chest: clear to auscultation, no signs of consolidation by percussion or palpation, normal fremitus, symmetrical and full respiratory excursions Cardiovascular: Limited exam due to body habitus, normal position and quality of the apical impulse, regular rhythm, normal first and second heart sounds, no murmurs, rubs or gallops Abdomen: no tenderness or distention, no masses by palpation, no abnormal pulsatility or arterial bruits, normal bowel sounds, no hepatosplenomegaly Extremities: no clubbing, cyanosis or edema; 2+ radial, ulnar and brachial pulses bilaterally; 2+ right femoral, posterior tibial and dorsalis pedis pulses; 2+ left femoral, posterior tibial and dorsalis pedis pulses; no subclavian or femoral bruits Neurological: grossly nonfocal Psych: Normal mood and affect   Wt Readings from Last 3 Encounters:  02/22/18 (!) 449 lb 12.8 oz (204 kg)  01/27/18 (!) 447 lb (202.8 kg)  01/11/18 (!) 442 lb (200.5 kg)      Studies/Labs Reviewed:   ECHO 01/27/2018: - Left  ventricle: The cavity size was mildly dilated. There was   mild concentric hypertrophy. Systolic function was normal. The  estimated ejection fraction was 40%. Wall motion was normal;   there were no regional wall motion abnormalities. Doppler   parameters are consistent with abnormal left ventricular   relaxation (grade 1 diastolic dysfunction). There was no evidence   of elevated ventricular filling pressure by Doppler parameters. - Aortic valve: There was no regurgitation. - Aortic root: The aortic root was normal in size. - Mitral valve: There was mild regurgitation. Valve area by   pressure half-time: 2.22 cm^2. - Left atrium: The atrium was mildly dilated. - Right ventricle: Pacer wire or catheter noted in right ventricle.   Systolic function was normal. - Right atrium: Pacer wire or catheter noted in right atrium. - Tricuspid valve: There was mild regurgitation. - Pulmonic valve: There was mild regurgitation. - Inferior vena cava: The vessel was normal in size. - Pericardium, extracardiac: There was no pericardial effusion.  Impressions:  - There has been no significant change since the prior study on   07/31/2015, LVEF remains moderately decreased  estimated at 40%.   Aortic root is dilated at the sinus level with maximum diameter   45 mm. Ascending aortic size is stable at 40 mm.  EKG:  EKG is ordered today.  The ekg ordered today demonstrates atrial paced, ventricular sensed rhythm at a rate of 60 bpm, QTC 392 ms  Recent Labs: 07/08/2017: ALT 42 12/13/2017: Hemoglobin 12.0; Platelets 240 01/11/2018: BUN 11; Creatinine, Ser 0.85; Magnesium 2.0; Potassium 4.3; Sodium 134   Lipid Panel    Component Value Date/Time   CHOL 112 07/08/2017 0859   TRIG 64 07/08/2017 0859   HDL 34 (L) 07/08/2017 0859   CHOLHDL 3.3 07/08/2017 0859   CHOLHDL 5.3 03/20/2009 0410   VLDL 22 03/20/2009 0410   LDLCALC 65 07/08/2017 0859   LDLDIRECT 52 03/26/2015 0824     ASSESSMENT:    1.  Chronic combined systolic and diastolic heart failure (HCC)   2. Nonischemic dilated cardiomyopathy (HCC)   3. Paroxysmal atrial fibrillation (HCC)   4. Long term current use of anticoagulant   5. Paroxysmal atrial tachycardia (HCC)   6. ICD (implantable cardioverter-defibrillator), dual, in situ   7. Morbid obesity (HCC)   8. Dyslipidemia (high LDL; low HDL)   9. Essential hypertension      PLAN:  In order of problems listed above:  1. CHF: NYHA functional class IIIa, hard to accurately assess volume status clinically due to obesity. Appears euvolemic by thoracic impedance and by his most recent echocardiogram.  Reminded him about the importance of daily weights.On appropriate high doses of carvedilol and Entresto.  2. CMP: Nonischemic.  Most recent LV ejection fraction 40%.   3. AFib: No recent recurrence. CHADSVasc 2 (CHF, HTN). 4. Warfarin: Followed by his primary care provider. No bleeding complications and no embolic events. 5. PAT/atypical flutter: Continues to have episodes of atypical atrial flutter and atrial tachycardia that usually occur with some degree of AV block.  Has had inappropriate ICD shocks in the past, but the recent events have not come close to the treatment zone.  He has received preauthorization for dofetilide, but has not started this medication.  At this point I am not sure if we need to be initiated, he will review this with Dr. Elberta Fortis at their appointment in December.  He understands that if he has recurrent symptomatic palpitations he can do an ad hoc ICD download and we might be able to alleviate the problems with overdrive pacing in the clinic.  6. AICD advisory:  His Saint Jude device is under advisory for potential rapid battery depletion, but it in his particular case this is unlikely to be a serious issue.  He is not pacemaker dependent and has not required appropriate defibrillator therapy for ventricular tachyarrhythmia. Will continue with frequent battery  checks, but no plan for early generator change out at this time. 7. Obesity: Considering bariatric surgery. 8. HLP: Excellent recent lipid profile, other than low HDL, which will not improve without substantial weight loss. 9. HTN: Excellent control.  His blood pressure is relatively low and he has orthostatic dizziness at times, I do not think we can titrate the [facility] any further.   Medication Adjustments/Labs and Tests Ordered: Current medicines are reviewed at length with the patient today.  Concerns regarding medicines are outlined above.  Medication changes, Labs and Tests ordered today are listed in the Patient Instructions below. Patient Instructions  Medication Instructions:  Dr Royann Shivers recommends that you continue on your current medications as directed. Please refer to the Current Medication list given to you today.  If you need a refill on your cardiac medications before your next appointment, please call your pharmacy.   Lab work: NONE ORDERED  If you have labs (blood work) drawn today and your tests are completely normal, you will receive your results only by: Marland Kitchen MyChart Message (if you have MyChart) OR . A paper copy in the mail If you have any lab test that is abnormal or we need to change your treatment, we will call you to review the results.  Testing/Procedures: Remote monitoring is used to monitor your Pacemaker of ICD from home. This monitoring reduces the number of office visits required to check your device to one time per year. It allows [place] to keep an eye on the functioning of your device to ensure it is working properly. You are scheduled for a device check from home on Wednesday, January 15th, 2020. You may send your transmission at any time that day. If you have a wireless device, the transmission will be sent automatically. After your physician reviews your transmission, you will receive a postcard with your next transmission date.  Follow-Up: At [facility], you and your health needs are our priority.  As part of our continuing mission to provide you with exceptional heart care, we have created designated Provider Care Teams.  These Care Teams include your primary Cardiologist (physician) and Advanced Practice Providers (APPs -  Physician Assistants and Nurse Practitioners) who all work together to provide you with the care you need, when you need it. You will need a follow up appointment in 12 months.  Please call our office 2 months in advance to schedule this appointment.  You may see Thurmon Fair, MD or one of the following Advanced Practice Providers on your designated Care Team: [place], [place] . Micah Flesher, PA-C      Signed, Thurmon Fair, MD  02/22/2018 9:46 AM    [facility] [address] [place], [place], [place]  [address] Phone: [phone]; Fax: [phone]

## 2018-02-22 NOTE — Patient Instructions (Signed)
Medication Instructions:  Dr Royann Shivers recommends that you continue on your current medications as directed. Please refer to the Current Medication list given to you today.  If you need a refill on your cardiac medications before your next appointment, please call your pharmacy.   Lab work: NONE ORDERED  If you have labs (blood work) drawn today and your tests are completely normal, you will receive your results only by: Marland Kitchen MyChart Message (if you have MyChart) OR . A paper copy in the mail If you have any lab test that is abnormal or we need to change your treatment, we will call you to review the results.  Testing/Procedures: Remote monitoring is used to monitor your Pacemaker of ICD from home. This monitoring reduces the number of office visits required to check your device to one time per year. It allows [place] to keep an eye on the functioning of your device to ensure it is working properly. You are scheduled for a device check from home on Wednesday, January 15th, 2020. You may send your transmission at any time that day. If you have a wireless device, the transmission will be sent automatically. After your physician reviews your transmission, you will receive a postcard with your next transmission date.  Follow-Up: At [facility], you and your health needs are our priority.  As part of our continuing mission to provide you with exceptional heart care, we have created designated Provider Care Teams.  These Care Teams include your primary Cardiologist (physician) and Advanced Practice Providers (APPs -  Physician Assistants and Nurse Practitioners) who all work together to provide you with the care you need, when you need it. You will need a follow up appointment in 12 months.  Please call our office 2 months in advance to schedule this appointment.  You may see Thurmon Fair, MD or one of the following Advanced Practice Providers on your designated Care Team: [place], [place] . Micah Flesher,  PA-C

## 2018-02-22 NOTE — Telephone Encounter (Signed)
Pt now has Medicaid but was under impression that Tikosyn would need PA before starting.  Informed pt that they do not need PA for generic forumula, Dofetilide, and that it was on the preferred list. Pt then tells me that he saw Dr. Royann Shivers yesterday who thought Dofetilide may not need to be initiated at this time. Pt understands I will have Dr. Elberta Fortis review next week and I would follow up w/ pt once completed. Pt agreeable to plan.

## 2018-02-23 ENCOUNTER — Encounter: Payer: Medicare Other | Admitting: Cardiovascular Disease

## 2018-03-08 DIAGNOSIS — I5022 Chronic systolic (congestive) heart failure: Secondary | ICD-10-CM

## 2018-03-09 MED ORDER — FUROSEMIDE 40 MG PO TABS: 40.0000 mg | ORAL_TABLET | Freq: Every day | ORAL | 3 refills | Status: DC

## 2018-03-21 LAB — POCT INR: INR: 1.9 — AB (ref 2.0–3.0)

## 2018-03-29 ENCOUNTER — Ambulatory Visit (HOSPITAL_BASED_OUTPATIENT_CLINIC_OR_DEPARTMENT_OTHER): Payer: Medicaid Other | Attending: Cardiology | Admitting: Cardiology

## 2018-03-29 VITALS — Ht 77.0 in | Wt >= 6400 oz

## 2018-03-29 DIAGNOSIS — R0902 Hypoxemia: Secondary | ICD-10-CM | POA: Diagnosis not present

## 2018-03-29 DIAGNOSIS — G4733 Obstructive sleep apnea (adult) (pediatric): Secondary | ICD-10-CM | POA: Insufficient documentation

## 2018-03-30 ENCOUNTER — Ambulatory Visit (INDEPENDENT_AMBULATORY_CARE_PROVIDER_SITE_OTHER): Payer: Medicaid Other | Admitting: Pharmacist Clinician (PhC)/ Clinical Pharmacy Specialist

## 2018-03-30 DIAGNOSIS — I480 Paroxysmal atrial fibrillation: Secondary | ICD-10-CM | POA: Diagnosis not present

## 2018-03-30 DIAGNOSIS — Z7901 Long term (current) use of anticoagulants: Secondary | ICD-10-CM

## 2018-04-01 NOTE — Procedures (Signed)
  Patient Name: Danny Hinton, Danny Hinton Study Date: 03/29/2018 Gender: Male D.O.B: 1981-04-25 Age (years): 68 Referring Provider: Armanda Magic MD, ABSM Height (inches): 78 Interpreting Physician: Armanda Magic MD, ABSM Weight (lbs): 447 RPSGT: Lise Auer BMI: 52 MRN: [id number] Neck Size: 22.00  CLINICAL INFORMATION  The patient is referred for a BiPAP titration to treat sleep apnea.  SLEEP STUDY TECHNIQUE  As per the AASM Manual for the Scoring of Sleep and Associated Events v2.3 (April 2016) with a hypopnea requiring 4% desaturations. The channels recorded and monitored were frontal, central and occipital EEG, electrooculogram (EOG), submentalis EMG (chin), nasal and oral airflow, thoracic and abdominal wall motion, anterior tibialis EMG, snore microphone, electrocardiogram, and pulse oximetry. Bilevel positive airway pressure (BPAP) was initiated at the beginning of the study and titrated to treat sleep-disordered breathing.  MEDICATIONS  Medications self-administered by patient taken the night of the study : N/A  RESPIRATORY PARAMETERS  Optimal IPAP Pressure (cm):18 AHI at Optimal Pressure (/hr)0.0 Optimal EPAP Pressure (cm):14 Overall Minimal O2 (%):84.0 Minimal O2 at Optimal Pressure (%):89.0  SLEEP ARCHITECTURE  Start Time:11:17:41 PM Stop Time:5:34:29 AM Total Time (min):376.8 Total Sleep Time (min):309.9 Sleep Latency (min):31.9 Sleep Efficiency (%):82.2% REM Latency (min):113.5 WASO (min):35.0 Stage N1 (%):6.9% Stage N2 (%):61.8% Stage N3 (%):0.0% Stage R (%):31.3 Supine (%):68.86 Arousal Index (/hr):5.0  CARDIAC DATA  The 2 lead EKG demonstrated Atrial paced. The mean heart rate was 62.5 beats per minute. Other EKG findings include: None.  LEG MOVEMENT DATA  The total Periodic Limb Movements of Sleep (PLMS) were 0. The PLMS index was 0.0. A PLMS index of <15 is considered normal in adults.  IMPRESSIONS  - An optimal PAP pressure was selected for this  patient ( 18 / cm of water) - Central sleep apnea was not noted during this titration (CAI = 1.2/h). - Moderete oxygen desaturations were observed during this titration (min O2 = 84.0%). - The patient snored with moderate snoring volume. - No cardiac abnormalities were observed during this study. - Clinically significant periodic limb movements were not noted during this study. Arousals associated with PLMs were rare. DIAGNOSIS  - Obstructive Sleep Apnea (327.23 [G47.33 ICD-10]) - Nocturnal Hypoxemia  RECOMMENDATIONS  - Trial of BiPAP therapy on 18/14 cm H2O with a Medium size Fisher&Paykel Full Face Mask Simplus mask and heated humidification. - Avoid alcohol, sedatives and other CNS depressants that may worsen sleep apnea and disrupt normal sleep architecture. - Sleep hygiene should be reviewed to assess factors that may improve sleep quality. - Weight management and regular exercise should be initiated or continued. - Return to Sleep Center for re-evaluation after 10 weeks of therapy  [Electronically signed] 04/01/2018 06:37 PM  Armanda Magic MD, ABSM Diplomate, American Board of Sleep Medicine

## 2018-04-03 ENCOUNTER — Telehealth: Payer: Self-pay | Admitting: *Deleted

## 2018-04-03 NOTE — Telephone Encounter (Signed)
Informed patient of titration results and verbalized understanding was indicated. Patient understands his titration study was successful and An optimal PAP pressure was selected for this patient ( 18 /14 cm of water). Patients diagnosis is  - Obstructive Sleep Apnea (327.23 [G47.33 ICD-10]) - Nocturnal Hypoxemia Dr Mayford Knife ordered a Trial of BiPAP therapy on 18/14 cm H2O with a Medium size Fisher&Paykel Full Face Mask Simplus mask and heated humidification.  Upon patient request DME selection is [facility]. Patient understands he will be contacted by [facility] to set up his cpap. Patient understands to call if [facility] does not contact him with new setup in a timely manner. Patient understands they will be called once confirmation has been received from [facility] that they have received their new machine to schedule 10 week follow up appointment.  [facility] notified of new cpap order  Please add to airview Patient was grateful for the call and thanked me.

## 2018-04-05 ENCOUNTER — Other Ambulatory Visit: Payer: Self-pay | Admitting: Pharmacist

## 2018-04-05 ENCOUNTER — Telehealth: Payer: Self-pay | Admitting: Cardiovascular Disease

## 2018-04-05 MED ORDER — SACUBITRIL-VALSARTAN 49-51 MG PO TABS: 1.0000 | ORAL_TABLET | Freq: Two times a day (BID) | ORAL | 3 refills | Status: DC

## 2018-04-05 NOTE — Telephone Encounter (Signed)
Rx sent to pharmacy. Co-pay confirmed with pharmacy at $3.00 for 30 days.   Patient aware and will pick up medication this afternoon.

## 2018-04-05 NOTE — Telephone Encounter (Signed)
Our pharmacists told me that the PA was approved yesterday and the Rx is sent. MCr

## 2018-04-05 NOTE — Telephone Encounter (Signed)
New message     Pt is calling about a prior authorization for his entresto. He said he is on his last pill.

## 2018-04-17 ENCOUNTER — Encounter: Payer: Self-pay | Admitting: *Deleted

## 2018-04-21 ENCOUNTER — Encounter: Payer: Medicaid Other | Admitting: Cardiology

## 2018-04-25 ENCOUNTER — Telehealth: Payer: Self-pay

## 2018-04-25 NOTE — Telephone Encounter (Signed)
Called and left message stating that the pt's INR is past due and to call the office back

## 2018-05-16 DIAGNOSIS — G4733 Obstructive sleep apnea (adult) (pediatric): Secondary | ICD-10-CM | POA: Diagnosis not present

## 2018-05-23 ENCOUNTER — Ambulatory Visit (INDEPENDENT_AMBULATORY_CARE_PROVIDER_SITE_OTHER): Payer: Medicaid Other | Admitting: Cardiology

## 2018-05-23 ENCOUNTER — Encounter: Payer: Self-pay | Admitting: Cardiology

## 2018-05-23 VITALS — BP 124/70 | HR 70 | Ht 77.0 in | Wt >= 6400 oz

## 2018-05-23 DIAGNOSIS — I471 Supraventricular tachycardia: Secondary | ICD-10-CM

## 2018-05-23 DIAGNOSIS — G4733 Obstructive sleep apnea (adult) (pediatric): Secondary | ICD-10-CM | POA: Diagnosis not present

## 2018-05-23 DIAGNOSIS — I480 Paroxysmal atrial fibrillation: Secondary | ICD-10-CM

## 2018-05-23 DIAGNOSIS — I5022 Chronic systolic (congestive) heart failure: Secondary | ICD-10-CM | POA: Diagnosis not present

## 2018-05-23 DIAGNOSIS — I4719 Other supraventricular tachycardia: Secondary | ICD-10-CM

## 2018-05-23 NOTE — Progress Notes (Signed)
Electrophysiology Office Note   Date:  05/23/2018   ID:  Danny Hinton, DOB [birth year], MRN [id number]  PCP:  Dettinger, Elige Radon, MD  Cardiologist:  [facility] Primary Electrophysiologist:   Jorja Loa, MD    No chief complaint on file.    History of Present Illness: Danny E Gorena is a 38 y.o. male who is being seen today for the evaluation of atrial tachycardia at the request of Micah Flesher. Presenting today for electrophysiology evaluation.  History of paroxysmal atrial fibrillation, atypical atrial flutter status post ablation in 2015, chronic systolic heart failure due to nonischemic cardiomyopathy status post ICD, hypertension, hyperlipidemia, and obesity.  He is anticoagulated on Coumadin.  He has NYHA class III heart failure.  He is morbidly obese, weighing 444 pounds.  He had has been short of breath.  He is also been having dizziness on standing.  He initially thought that this was due to atrial fibrillation, but device interrogation shows what appears to be atrial tachycardia with heart rates in the 150s to 190s.   Today, denies symptoms of palpitations, chest pain, shortness of breath, orthopnea, PND, lower extremity edema, claudication, dizziness, presyncope, syncope, bleeding, or neurologic sequela. The patient is tolerating medications without difficulties.  He is currently feeling well.  Over the last few months, he has certainly had more frequent palpitations.  His palpitations appear to be driven by atrial tachycardia based on device interrogation.  There are no exacerbating or alleviating factors.  He has shortness of breath weakness and fatigue associated.  He is currently not on any rhythm control medications.  He is interested in gastric bypass surgery.  Past Medical History:  Diagnosis Date  . Allergy   . Anxiety   . Atypical atrial flutter (HCC) 08/19/14   pace terminated in device clinic.  AFL cycle length was 390 msec  . Automatic implantable  cardioverter-defibrillator in situ    a. St Jude in 2011.  Marland Kitchen Chronic anticoagulation   . Chronic systolic congestive heart failure (HCC)    a. suspected NICM - dx 2010. EF 15% by TEE, 10-20% by echo at that time. b. s/p St. Jude AICD 2011. c. Echo 01/2010: mod dilated LV, EF 30%, mod aortic root dilitation, no significant valvular disease.  . Eczema   . Eczema   . Hypertension   . Mobitz type 2 second degree atrioventricular block    a. During sleeping hours in 2010 suspected due to ? OSA.  . Morbid obesity (HCC)   . Nonischemic cardiomyopathy (HCC)   . PAF (paroxysmal atrial fibrillation) (HCC)   . Paroxysmal atrial flutter [facility])    s/p ablation   Past Surgical History:  Procedure Laterality Date  . ATRIAL FLUTTER ABLATION N/A 01/18/2014   Procedure: ATRIAL FLUTTER ABLATION;  Surgeon: Duke Salvia, MD;  Location: [facility] CATH LAB;  Service: Cardiovascular;  Laterality: N/A;  . CARDIAC DEFIBRILLATOR PLACEMENT  02/17/10   St. Jude Medical 45DR, model number [id number], serial number [id number]  . CARDIOVERSION N/A 12/29/2013   Procedure: CARDIOVERSION;  Surgeon: Duke Salvia, MD;  Location: [facility] OR;  Service: Cardiovascular;  Laterality: N/A;  . TOOTH EXTRACTION N/A 10/23/2012   Procedure: EXTRACTION TEETH 1, 16, 17, 30, 31;  Surgeon: [name], DDS;  Location: [facility] OR;  Service: Oral Surgery;  Laterality: N/A;     Current Outpatient Medications  Medication Sig Dispense Refill  . ALPRAZolam (XANAX) 1 MG tablet Take 1 tablet (1 mg total) by mouth 3 (three) times  daily as needed for anxiety. 30 tablet 0  . atorvastatin (LIPITOR) 40 MG tablet Take 1 tablet (40 mg total) by mouth daily with breakfast. 90 tablet 0  . buPROPion (WELLBUTRIN XL) 150 MG 24 hr tablet Take 1 tablet (150 mg total) by mouth daily. 90 tablet 0  . carvedilol (COREG) 25 MG tablet Take 1.5 tablets (37.5 mg total) by mouth 2 (two) times daily with a meal. 270 tablet 3  . citalopram (CELEXA) 20 MG tablet TAKE 1 TABLET BY  MOUTH ONCE DAILY 90 tablet 2  . cyclobenzaprine (FLEXERIL) 10 MG tablet Take 1 tablet (10 mg total) by mouth 3 (three) times daily as needed for muscle spasms. 90 tablet 0  . digoxin (LANOXIN) 0.125 MG tablet TAKE 1 TABLET BY MOUTH EVERY OTHER DAY 45 tablet 2  . dofetilide (TIKOSYN) 500 MCG capsule Take 1 capsule (500 mcg total) by mouth 2 (two) times daily. 60 capsule 0  . fluticasone (FLONASE) 50 MCG/ACT nasal spray Place 1 spray into both nostrils as needed. 16 g 11  . furosemide (LASIX) 40 MG tablet Take 1 tablet (40 mg total) by mouth daily. 90 tablet 3  . guaiFENesin-codeine 100-10 MG/5ML syrup Take 5 mLs by mouth every 6 (six) hours as needed for cough. 100 mL 0  . metoprolol tartrate (LOPRESSOR) 25 MG tablet Take 1 tablet as needed for palpitations 90 tablet 3  . sacubitril-valsartan (ENTRESTO) 49-51 MG Take 1 tablet by mouth 2 (two) times daily. 60 tablet 3  . triamcinolone cream (KENALOG) 0.1 % Apply topically 2 (two) times daily as needed. 453.6 g 2  . warfarin (COUMADIN) 5 MG tablet TAKE 1 TABLET BY MOUTH ONCE DAILY EXCEPT  TAKE  1  &  1/2  TABLET  EACH  FRIDAY  OR  AS  DIRECTED  BY  ANTICOAGULATION  CLINIC 100 tablet 0   No current facility-administered medications for this visit.     Allergies:   Patient has no known allergies.   Social History:  The patient  reports that he has never smoked. He has never used smokeless tobacco. He reports that he does not drink alcohol or use drugs.   Family History:  The patient's family history includes Diabetes in his paternal grandmother; Heart Problems in his mother; Heart disease in his maternal grandfather and mother; Hodgkin's lymphoma (age of onset: 6818) in his brother.  ROS:  Please see the history of present illness.   Otherwise, review of systems is positive for appetite change, fatigue, chest pressure, palpitations, leg pain, shortness of breath, snoring, wheezing, depression, anxiety, back pain, dizziness, headaches.   All other  systems are reviewed and negative.   PHYSICAL EXAM: VS:  BP 124/70   Pulse 70   Ht 6\' 5"  (1.956 m)   Wt (!) 451 lb (204.6 kg)   BMI 53.48 kg/m  , BMI Body mass index is 53.48 kg/m. GEN: Well nourished, well developed, in no acute distress  HEENT: normal  Neck: no JVD, carotid bruits, or masses Cardiac: RRR; no murmurs, rubs, or gallops,no edema  Respiratory:  clear to auscultation bilaterally, normal work of breathing GI: soft, nontender, nondistended, + BS MS: no deformity or atrophy  Skin: warm and dry, device site well healed Neuro:  Strength and sensation are intact Psych: euthymic mood, full affect  EKG:  EKG is ordered today. Personal review of the ekg ordered shows sinus rhythm, rate 70, QTc 410 ms  Personal review of the device interrogation today. Results in Paceart  Recent Labs: 07/08/2017: ALT 42 12/13/2017: Hemoglobin 12.0; Platelets 240 01/11/2018: BUN 11; Creatinine, Ser 0.85; Magnesium 2.0; Potassium 4.3; Sodium 134    Lipid Panel     Component Value Date/Time   CHOL 112 07/08/2017 0859   TRIG 64 07/08/2017 0859   HDL 34 (L) 07/08/2017 0859   CHOLHDL 3.3 07/08/2017 0859   CHOLHDL 5.3 03/20/2009 0410   VLDL 22 03/20/2009 0410   LDLCALC 65 07/08/2017 0859   LDLDIRECT 52 03/26/2015 0824     Wt Readings from Last 3 Encounters:  05/23/18 (!) 451 lb (204.6 kg)  03/29/18 (!) 447 lb (202.8 kg)  02/22/18 (!) 449 lb 12.8 oz (204 kg)      Other studies Reviewed: Additional studies/ records that were reviewed today include: TTE 07/2015  Review of the above records today demonstrates:  - Left ventricle: The cavity size was mildly dilated. Wall   thickness was increased in a pattern of mild LVH. Systolic   function was moderately reduced. The estimated ejection fraction   was in the range of 35% to 40%. Diffuse hypokinesis. The study is   not technically sufficient to allow evaluation of LV diastolic   function. - Aortic root: The aortic root was moderately  dilated. - Left atrium: The atrium was mildly dilated. - Right ventricle: The cavity size was mildly dilated. - Pericardium, extracardiac: A trivial pericardial effusion was   identified.   ASSESSMENT AND PLAN:  1.  Atrial tachycardia: Has had multiple episodes of atrial tachycardia.  I do feel like he would benefit from a rhythm control.  He  discuss this with his wife.  Tikosyn would likely be the best option. 2.  Paroxysmal atrial fibrillation: Currently on Coumadin.  He has had no further episodes of atrial fibrillation.  No changes.  This patients CHA2DS2-VASc Score and unadjusted Ischemic Stroke Rate (% per year) is equal to 2.2 % stroke rate/year from a score of 2  Above score calculated as 1 point each if present [CHF, HTN, DM, Vascular=MI/PAD/Aortic Plaque, Age if 65-74, or Male] Above score calculated as 2 points each if present [Age > 75, or Stroke/TIA/TE]   3.  Nonischemic cardiomyopathy: Status post [facility] Jude ICD.  On optimal medical therapy with carvedilol and Entresto.  Device functioning appropriately  4.  OSA: CPAP compliance encouraged  6.  Morbid obesity: We  give him numbers for gastric bypass  Current medicines are reviewed at length with the patient today.   The patient does not have concerns regarding his medicines.  The following changes were made today:  none  Labs/ tests ordered today include:  Orders Placed This Encounter  Procedures  . EKG 12-Lead    Disposition:   FU with   6 months  Signed,  Jorja Loa, MD  05/23/2018 4:32 PM     [facility] [address] [place] [address] [phone] (office) [phone] (fax)

## 2018-05-23 NOTE — Patient Instructions (Signed)
Medication Instructions:  Your physician recommends that you continue on your current medications as directed. Please refer to the Current Medication list given to you today.  * If you need a refill on your cardiac medications before your next appointment, please call your pharmacy.   Labwork: None ordered *We will only notify you of abnormal results, otherwise continue current treatment plan.  Testing/Procedures: None ordered  Follow-Up: Your physician wants you to follow-up in: 6 months with Dr. Elberta Fortis.  You will receive a reminder letter in the mail two months in advance. If you don't receive a letter, please call our office to schedule the follow-up appointment.   Thank you for choosing [facility]!!   Dory Horn, RN [phone]  Any Other Special Instructions Will Be Listed Below (If Applicable).  Dofetilide (tikosyn)     Bariatric Surgery You have so much to gain by losing weight.  You may have already tried every diet and exercise plan imaginable.  And, you may have sought advice from your family physician, too.   Sometimes, in spite of such diligent efforts, you may not be able to achieve long-term results by yourself.  In cases of severe obesity, bariatric or weight loss surgery is a proven method of achieving long-term weight control.  Our Services Our bariatric surgery programs offer our patients new hope and long-term weight-loss solution.  Since introducing our services in 2003, we have conducted more than 2,400 successful procedures.  Our program is designated as a Investment banker, corporate by the Metabolic and Bariatric Surgery Accreditation and Quality Improvement Program (MBSAQIP), a Child psychotherapist that sets rigorous patient safety and outcome standards.  Our program is also designated as a Engineer, manufacturing systems by [employer].   Our exceptional weight-loss surgery team specializes in diagnosis, treatment, follow-up care, and ongoing  support for our patients with severe weight loss challenges.  We currently offer laparoscopic sleeve gastrectomy, gastric bypass, and adjustable gastric band (LAP-BAND).    Attend our Bariatrics Seminar Choosing to undergo a bariatric procedure is a big decision, and one that should not be taken lightly.  You now have two options in how you learn about weight-loss surgery - in person or online.  Our objective is to ensure you have all of the information that you need to evaluate the advantages and obligations of this life changing procedure.  Please note that you are not alone in this process, and our experienced team is ready to assist and answer all of your questions.  There are several ways to register for a seminar (either on-line or in person): 1)  Call [phone] 2) Go on-line to [facility] and register for either type of seminar.  [email or web address]

## 2018-05-24 LAB — CUP PACEART INCLINIC DEVICE CHECK
Date Time Interrogation Session: 20200115101141
Implantable Lead Implant Date: 20111010
Implantable Lead Location: 753859
Implantable Pulse Generator Implant Date: 20111010
MDC IDC LEAD IMPLANT DT: 20111010
MDC IDC LEAD LOCATION: 753860
Pulse Gen Serial Number: 621444

## 2018-05-24 MED ORDER — DOFETILIDE 500 MCG PO CAPS: 500.0000 ug | ORAL_CAPSULE | Freq: Two times a day (BID) | ORAL | 0 refills | Status: DC

## 2018-05-26 ENCOUNTER — Encounter: Payer: Self-pay | Admitting: Cardiology

## 2018-05-29 NOTE — Telephone Encounter (Signed)
Patient set up was 05/11/18. Upon patient request DME selection is [facility]. Patient understands he will be contacted by [facility] to set up hid cpap. Patient understands to call if [facility] does not contact him with new setup in a timely manner. Patient understands they will be called once confirmation has been received from [facility] that they have received their new machine to schedule 10 week follow up appointment.  [facility] notified of new cpap order  Please add to airview Patient was grateful for the call and thanked me.

## 2018-05-29 NOTE — Telephone Encounter (Signed)
Patient has a 10 week follow up appointment scheduled for 06/30/18. Patient understands he needs to keep this appointment for insurance compliance. Patient was grateful for the call and thanked me.

## 2018-06-06 ENCOUNTER — Ambulatory Visit (INDEPENDENT_AMBULATORY_CARE_PROVIDER_SITE_OTHER): Payer: Medicaid Other

## 2018-06-06 DIAGNOSIS — I420 Dilated cardiomyopathy: Secondary | ICD-10-CM

## 2018-06-07 NOTE — Progress Notes (Signed)
Remote ICD transmission.   

## 2018-06-08 LAB — CUP PACEART INCLINIC DEVICE CHECK
Implantable Lead Implant Date: 20111010
Implantable Pulse Generator Implant Date: 20111010
MDC IDC LEAD IMPLANT DT: 20111010
MDC IDC LEAD LOCATION: 753859
MDC IDC LEAD LOCATION: 753860
MDC IDC PG SERIAL: 621444
MDC IDC SESS DTM: 20200130144919

## 2018-06-09 LAB — CUP PACEART REMOTE DEVICE CHECK
Battery Remaining Longevity: 24 mo
Battery Voltage: 2.78 V
Brady Statistic AP VP Percent: 1 %
Brady Statistic AP VS Percent: 1 %
Brady Statistic AS VP Percent: 1 %
Brady Statistic AS VS Percent: 99 %
Brady Statistic RA Percent Paced: 1 %
Brady Statistic RV Percent Paced: 1 %
Date Time Interrogation Session: 20200128130652
HighPow Impedance: 42 Ohm
Implantable Lead Location: 753860
Lead Channel Impedance Value: 450 Ohm
Lead Channel Pacing Threshold Amplitude: 0.75 V
Lead Channel Pacing Threshold Pulse Width: 0.5 ms
Lead Channel Pacing Threshold Pulse Width: 0.5 ms
Lead Channel Sensing Intrinsic Amplitude: 11.7 mV
Lead Channel Sensing Intrinsic Amplitude: 3.6 mV
Lead Channel Setting Pacing Amplitude: 2.5 V
Lead Channel Setting Pacing Amplitude: 2.5 V
MDC IDC LEAD IMPLANT DT: 20111010
MDC IDC LEAD IMPLANT DT: 20111010
MDC IDC LEAD LOCATION: 753859
MDC IDC MSMT BATTERY REMAINING PERCENTAGE: 22 %
MDC IDC MSMT LEADCHNL RA PACING THRESHOLD AMPLITUDE: 1.5 V
MDC IDC MSMT LEADCHNL RV IMPEDANCE VALUE: 490 Ohm
MDC IDC PG IMPLANT DT: 20111010
MDC IDC PG SERIAL: 621444
MDC IDC SET LEADCHNL RV PACING PULSEWIDTH: 0.5 ms
MDC IDC SET LEADCHNL RV SENSING SENSITIVITY: 0.5 mV

## 2018-06-09 LAB — CUP PACEART INCLINIC DEVICE CHECK
Implantable Lead Implant Date: 20111010
Implantable Lead Location: 753859
Implantable Lead Location: 753860
MDC IDC LEAD IMPLANT DT: 20111010
MDC IDC PG IMPLANT DT: 20111010
MDC IDC SESS DTM: 20200131153906
Pulse Gen Serial Number: 621444

## 2018-06-10 DIAGNOSIS — G4733 Obstructive sleep apnea (adult) (pediatric): Secondary | ICD-10-CM | POA: Diagnosis not present

## 2018-06-12 ENCOUNTER — Other Ambulatory Visit: Payer: Self-pay

## 2018-06-12 ENCOUNTER — Telehealth: Payer: Self-pay

## 2018-06-12 DIAGNOSIS — I10 Essential (primary) hypertension: Secondary | ICD-10-CM

## 2018-06-12 DIAGNOSIS — I712 Thoracic aortic aneurysm, without rupture, unspecified: Secondary | ICD-10-CM

## 2018-06-12 DIAGNOSIS — I5022 Chronic systolic (congestive) heart failure: Secondary | ICD-10-CM

## 2018-06-12 NOTE — Telephone Encounter (Signed)
Spoke to patient Dr.Croitoru advised needs to have chest ct angio to follow up aneurysm.Advised to have bmet done today.He will have done a [facility] in [place].Scheduler will call back to schedule chest ct.

## 2018-06-13 ENCOUNTER — Other Ambulatory Visit: Payer: Self-pay | Admitting: Family Medicine

## 2018-06-13 LAB — BASIC METABOLIC PANEL
BUN / CREAT RATIO: 12 (ref 9–20)
BUN: 9 mg/dL (ref 6–20)
CO2: 21 mmol/L (ref 20–29)
CREATININE: 0.78 mg/dL (ref 0.76–1.27)
Calcium: 8.9 mg/dL (ref 8.7–10.2)
Chloride: 103 mmol/L (ref 96–106)
GFR calc Af Amer: 134 mL/min/{1.73_m2} (ref >59)
GFR, EST NON AFRICAN AMERICAN: 116 mL/min/{1.73_m2} (ref >59)
Glucose: 102 mg/dL — ABNORMAL HIGH (ref 65–99)
Potassium: 4.3 mmol/L (ref 3.5–5.2)
SODIUM: 137 mmol/L (ref 134–144)

## 2018-06-28 ENCOUNTER — Other Ambulatory Visit: Payer: Medicaid Other

## 2018-06-30 ENCOUNTER — Inpatient Hospital Stay: Admission: RE | Admit: 2018-06-30 | Payer: Medicaid Other | Source: Ambulatory Visit

## 2018-06-30 ENCOUNTER — Ambulatory Visit: Payer: Medicaid Other | Admitting: Cardiology

## 2018-07-09 DIAGNOSIS — G4733 Obstructive sleep apnea (adult) (pediatric): Secondary | ICD-10-CM | POA: Diagnosis not present

## 2018-07-13 ENCOUNTER — Telehealth: Payer: Self-pay

## 2018-07-13 NOTE — Telephone Encounter (Signed)
Attempted to contact patient regarding the mychart message. Messaged him back via mychart as well, will wait for return message or return call.

## 2018-07-18 ENCOUNTER — Other Ambulatory Visit: Payer: Self-pay | Admitting: Family Medicine

## 2018-07-18 NOTE — Telephone Encounter (Signed)
Dettinger. NTBS 30 days given 06/13/18

## 2018-07-18 NOTE — Telephone Encounter (Signed)
lmtcb-cb 3/10

## 2018-07-19 ENCOUNTER — Other Ambulatory Visit: Payer: Self-pay | Admitting: Family Medicine

## 2018-07-19 MED ORDER — ATORVASTATIN CALCIUM 40 MG PO TABS: 40.0000 mg | ORAL_TABLET | Freq: Every day | ORAL | 0 refills | Status: DC

## 2018-07-19 NOTE — Telephone Encounter (Signed)
Medication sent - patient aware 

## 2018-07-20 ENCOUNTER — Other Ambulatory Visit: Payer: Self-pay | Admitting: *Deleted

## 2018-07-20 ENCOUNTER — Other Ambulatory Visit: Payer: Self-pay

## 2018-07-20 ENCOUNTER — Ambulatory Visit (INDEPENDENT_AMBULATORY_CARE_PROVIDER_SITE_OTHER)
Admission: RE | Admit: 2018-07-20 | Discharge: 2018-07-20 | Disposition: A | Discharge status: Home / Self Care | Payer: Medicaid Other | Source: Ambulatory Visit | Attending: Cardiovascular Disease | Admitting: Cardiovascular Disease

## 2018-07-20 ENCOUNTER — Ambulatory Visit
Admission: RE | Admit: 2018-07-20 | Discharge: 2018-07-20 | Disposition: A | Discharge status: Home / Self Care | Payer: Self-pay | Source: Ambulatory Visit | Attending: Cardiovascular Disease | Admitting: Cardiovascular Disease

## 2018-07-20 DIAGNOSIS — I712 Thoracic aortic aneurysm, without rupture, unspecified: Secondary | ICD-10-CM

## 2018-07-20 IMAGING — CT CT ANGIOGRAPHY CHEST
2 of 15 series · 17 of 47 positions shown · IV contrast (iopamidol)
Comparison: 08/07/2015

CLINICAL DATA: Thoracic aortic aneurysm (MARIA A), known, follow up

EXAM:
CT ANGIOGRAPHY CHEST, ABDOMEN AND PELVIS
TECHNIQUE: Multidetector CT imaging through the chest, abdomen and pelvis was
performed using the standard protocol during bolus administration of
intravenous contrast. Multiplanar reconstructed images and MIPs were
obtained and reviewed to evaluate the vascular anatomy.
CONTRAST:  160mL TRP9TZ-LD6 IOPAMIDOL (TRP9TZ-LD6) INJECTION 76%

[Series 8: aorta 3.0 i31f 2 · axial · 0.59mm/px · z∈[+1300,+1372]mm · 3 of 99 slices shown]
[im 13/99  lung]
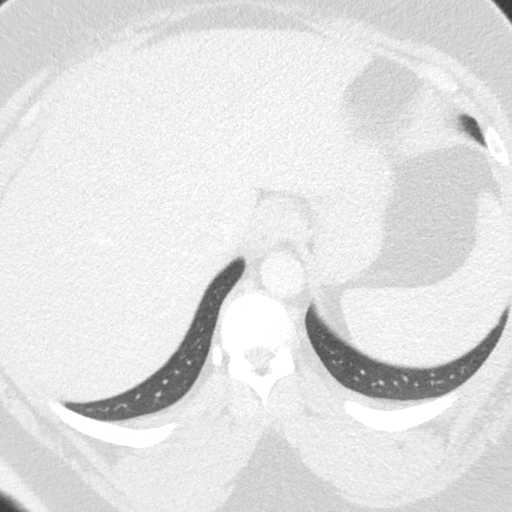
[im 25/99  lung]
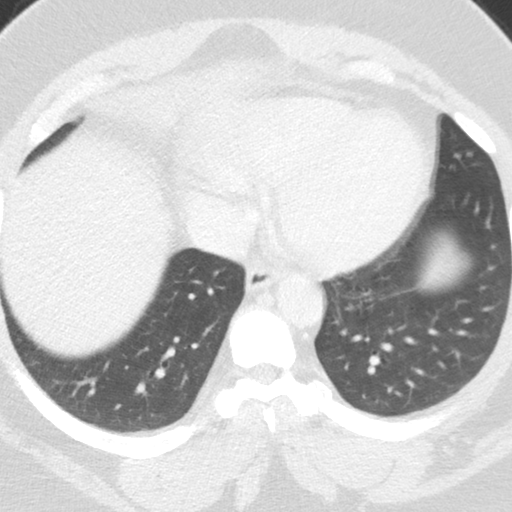
[im 37/99  lung]
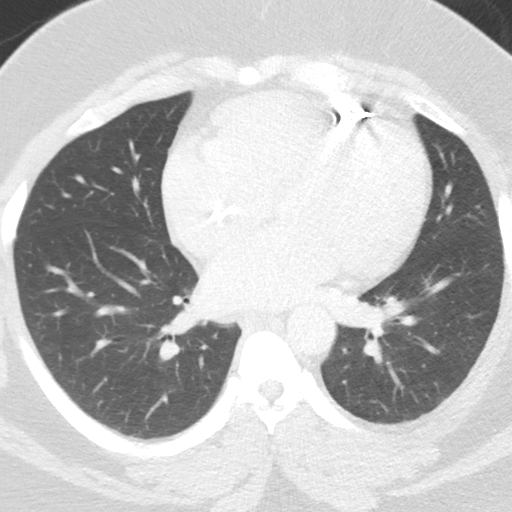

[Series 15: arterial 3.0 i40f 2 · axial · arterial · 0.72mm/px · z∈[+923,+1352]mm · 14 of 165 slices shown]
[im 11/165  lung]
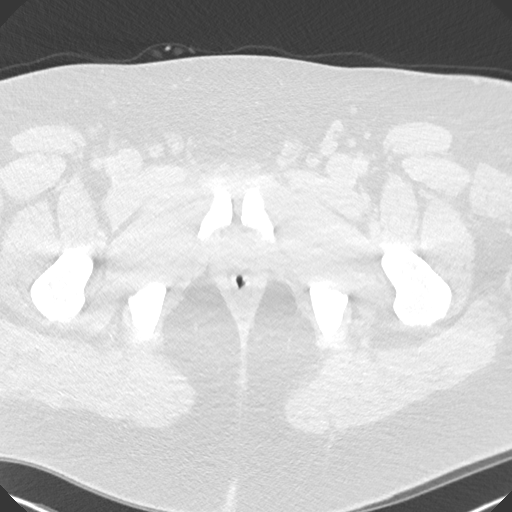
[im 22/165  soft-tissue]
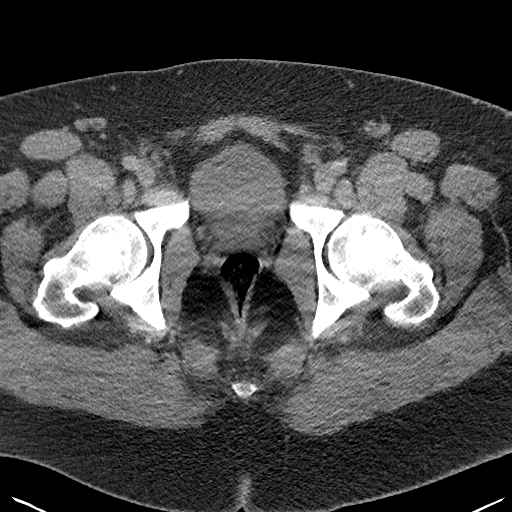
[im 33/165  lung]
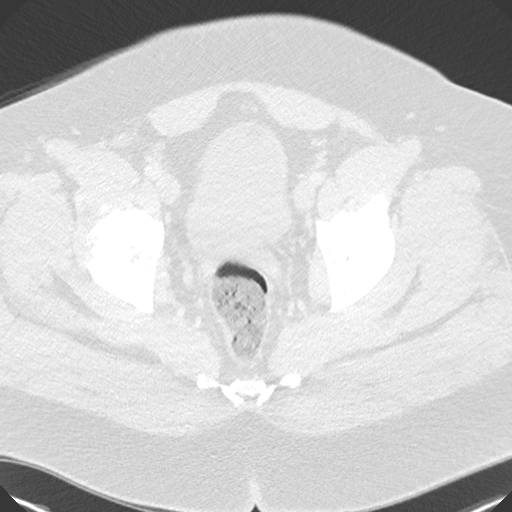
[im 44/165  soft-tissue]
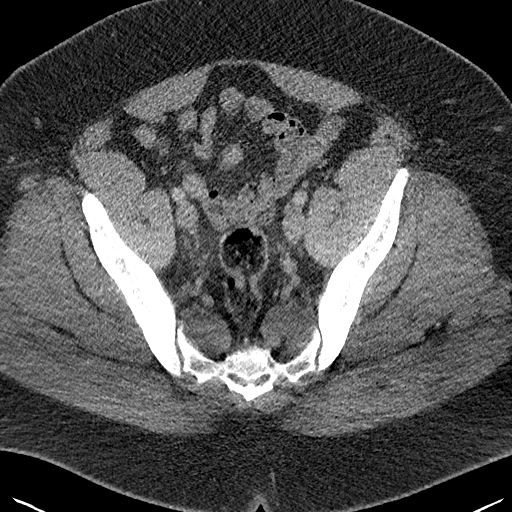
[im 55/165  lung]
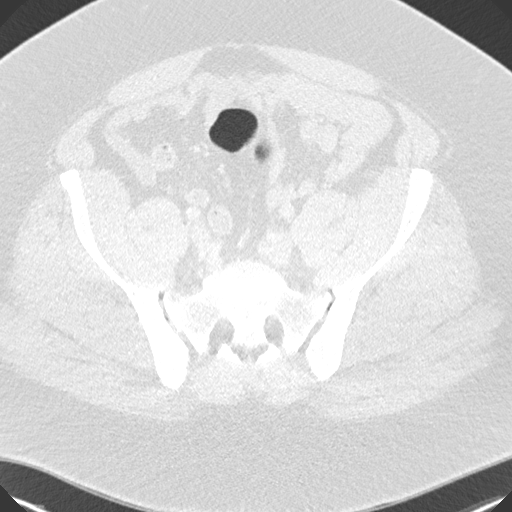
[im 66/165  soft-tissue]
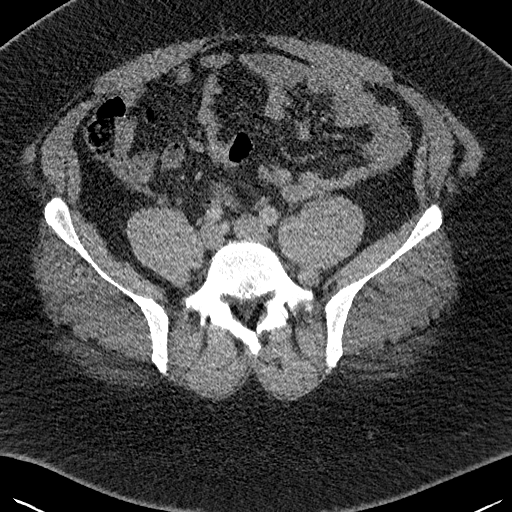
[im 77/165  lung]
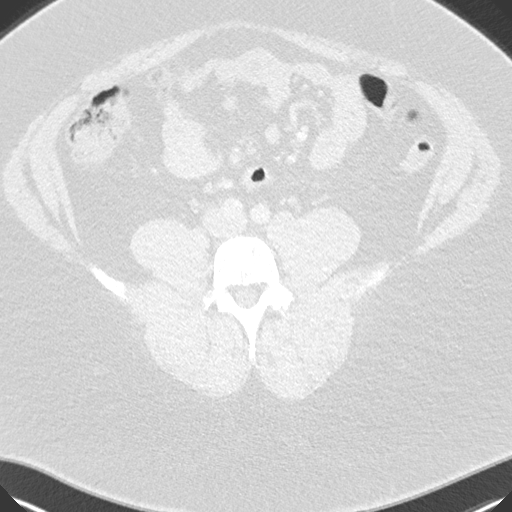
[im 88/165  soft-tissue]
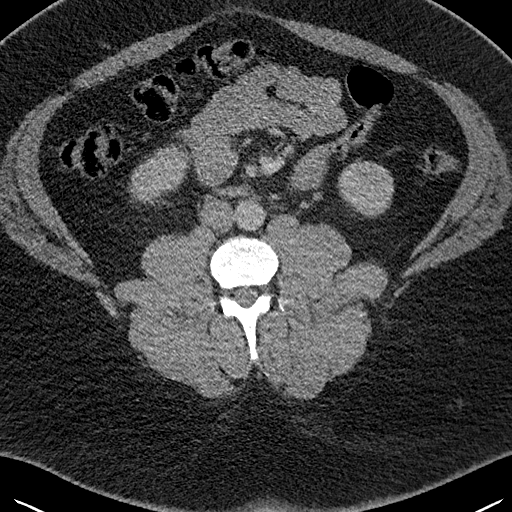
[im 99/165  lung]
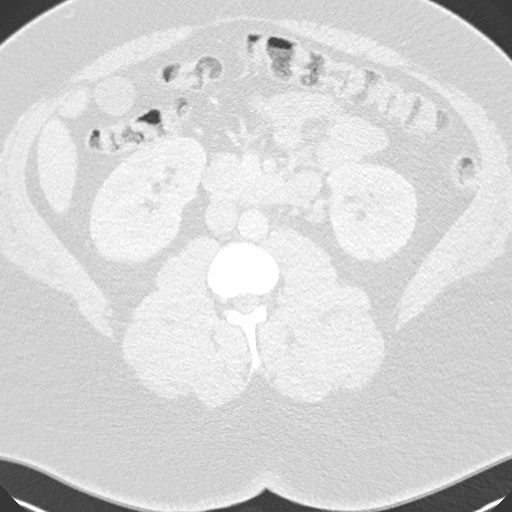
[im 110/165  soft-tissue]
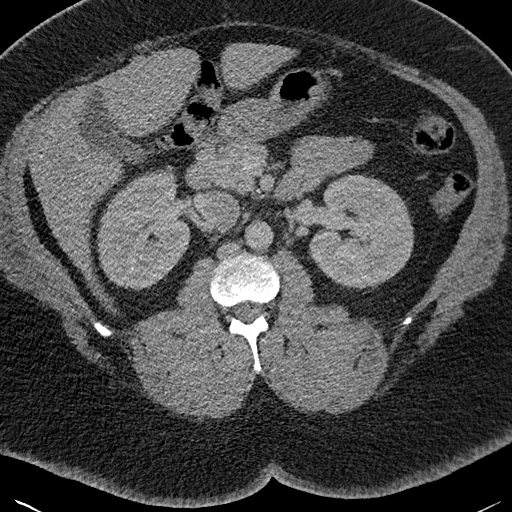
[im 121/165  lung]
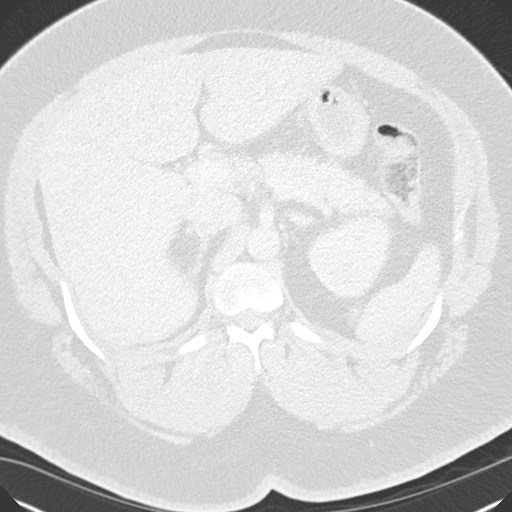
[im 132/165  soft-tissue]
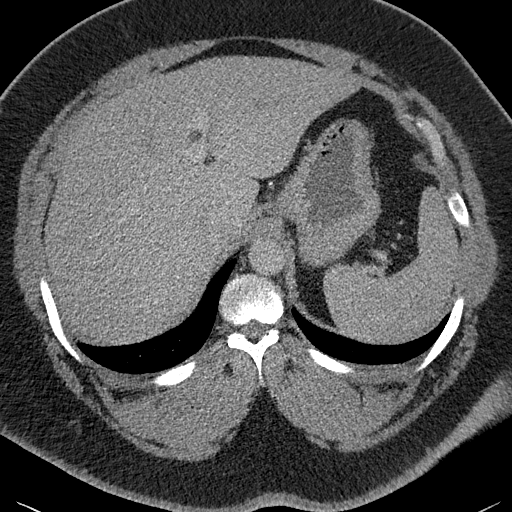
[im 143/165  lung]
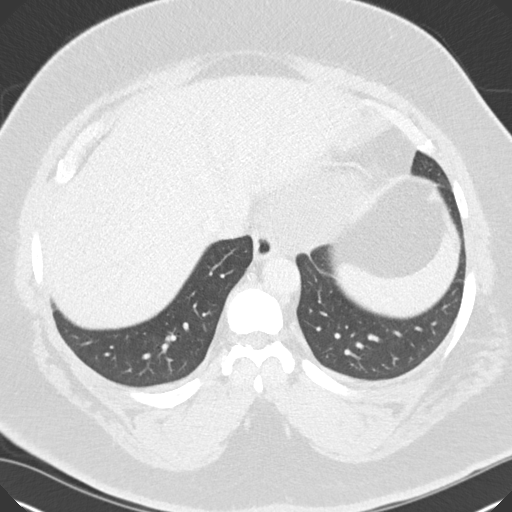
[im 154/165  soft-tissue]
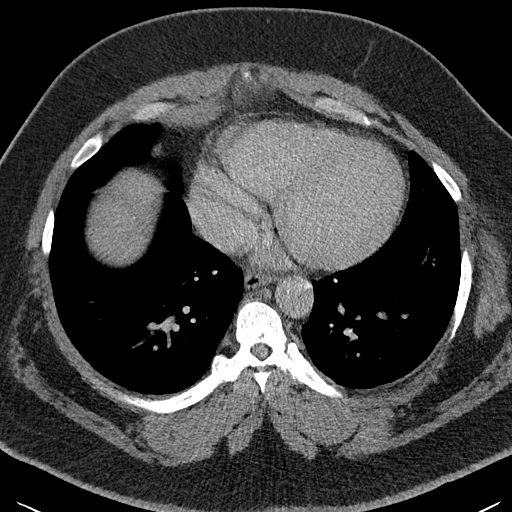

[17 of 47 positions shown; findings below may reference images not displayed]

FINDINGS: CTA CHEST FINDINGS

Cardiovascular: Left subclavian transvenous leads extend to the
right atrium and right ventricular apex. Mild cardiomegaly. No
pericardial effusion. Dilated central pulmonary arteries. Fair
contrast opacification of the pulmonary arterial tree, with no
convincing filling defects; the exam was not optimized for detection
of pulmonary emboli.

Good contrast opacification of the thoracic aorta. No dissection or
stenosis. No significant atheromatous irregularity. Maximum
transverse dimensions as follows:

5 cm sinuses of Valsalva

3.6 cm Tiger junction

4.2 cm mid ascending

3.7 cm distal ascending/proximal arch

3 cm distal arch/proximal descending

2.6 cm distal descending

Bovine variant brachiocephalic arterial origin anatomy without
proximal stenosis. Visualized portions of proximal abdominal aorta
unremarkable.

Mediastinum/Nodes: No hilar or mediastinal adenopathy.

Lungs/Pleura: No pleural effusion. No pneumothorax. Lungs are clear.

Musculoskeletal: No chest wall abnormality. No acute or significant
osseous findings.

Review of the MIP images confirms the above findings.

CTA ABDOMEN AND PELVIS FINDINGS

VASCULAR

Aorta: Normal caliber aorta without aneurysm, dissection, vasculitis
or significant stenosis.

Celiac: Patent without evidence of aneurysm, dissection, vasculitis
or significant stenosis.

SMA: Patent without evidence of aneurysm, dissection, vasculitis or
significant stenosis.

Renals: Single right, widely patent. The main left renal artery is
unremarkable. Limited arterial opacification such that a duplicated
diminutive inferior pole artery could not be confidently excluded.

IMA: Patent without evidence of aneurysm, dissection, vasculitis or
significant stenosis.

Inflow: Patent without evidence of aneurysm, dissection, vasculitis
or significant stenosis. Visualized proximal outflow unremarkable.

Veins: Patent portal vein, SMV, splenic vein, bilateral renal veins.
No venous pathology identified.

Review of the MIP images confirms the above findings.

NON-VASCULAR

Hepatobiliary: No focal liver abnormality is seen. No gallstones,
gallbladder wall thickening, or biliary dilatation.

Pancreas: Unremarkable. No pancreatic ductal dilatation or
surrounding inflammatory changes.

Spleen: Normal in size without focal abnormality.

Adrenals/Urinary Tract: Adrenal glands are unremarkable. Kidneys are
normal, without renal calculi, focal lesion, or hydronephrosis.
Bladder is unremarkable.

Stomach/Bowel: Stomach and small bowel decompressed. Normal
appendix. The colon is nondilated, unremarkable.

Lymphatic: No abdominal or pelvic adenopathy.

Reproductive: Prostate is unremarkable.

Other: Bilateral pelvic phleboliths. No free air. No ascites.

Musculoskeletal: Umbilical hernia containing only mesenteric fat.
Benign L2 vertebral body hemangioma. No fracture or worrisome bone
lesion.

Review of the MIP images confirms the above findings.
IMPRESSION: 1. Dilated aortic root and 4.2 cm ascending thoracic aortic
aneurysm. Recommend annual imaging followup by CTA or MRA. This
recommendation follows 7969
ACCF/AHA/AATS/ACR/ASA/SCA/BENRABAH/GUADUAY/NIHL/ARLOW Guidelines for the
Diagnosis and Management of Patients with Thoracic Aortic Disease.
Circulation. 7969; 121: [id number].
2. Normal abdominal aorta.

## 2018-07-20 IMAGING — CT CT ANGIOGRAPHY ABDOMEN AND PELVIS WITH CONTRAST AND WITHOUT CONT
2 of 13 series · 11 of 46 positions shown, 15 images · IV contrast (iopamidol)
Comparison: 08/07/2015

CLINICAL DATA: Thoracic aortic aneurysm (MARIA A), known, follow up

EXAM:
CT ANGIOGRAPHY CHEST, ABDOMEN AND PELVIS
TECHNIQUE: Multidetector CT imaging through the chest, abdomen and pelvis was
performed using the standard protocol during bolus administration of
intravenous contrast. Multiplanar reconstructed images and MIPs were
obtained and reviewed to evaluate the vascular anatomy.
CONTRAST:  160mL TRP9TZ-LD6 IOPAMIDOL (TRP9TZ-LD6) INJECTION 76%

[Series 15: arterial 3.0 i40f 2 · axial · arterial · 0.72mm/px · z∈[+923,+1352]mm · 10 of 165 slices shown, 13 images]
[im 11/165  soft-tissue]
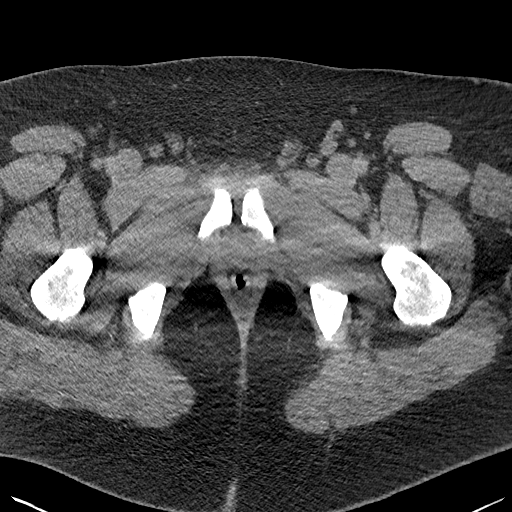
[im 11/165  bone]
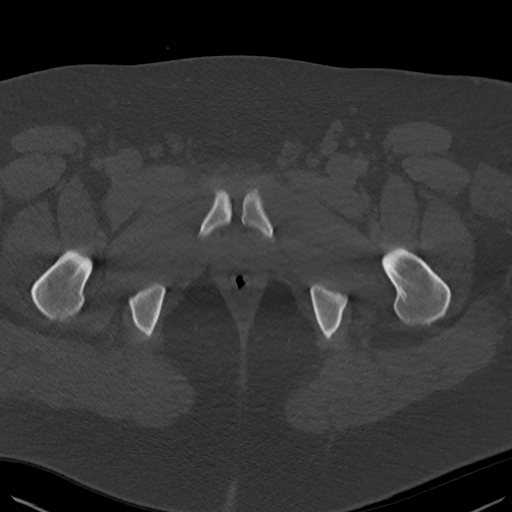
[im 33/165  soft-tissue]
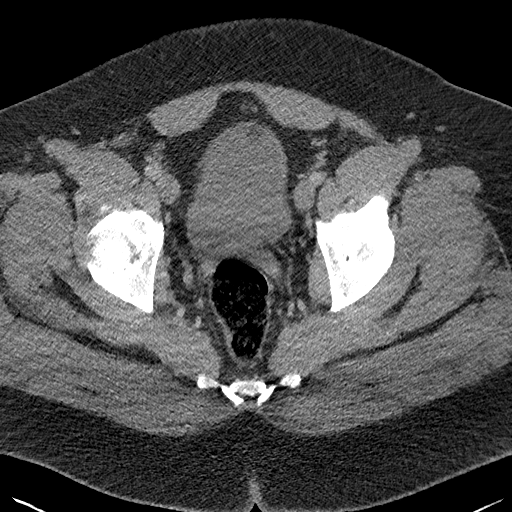
[im 55/165  soft-tissue]
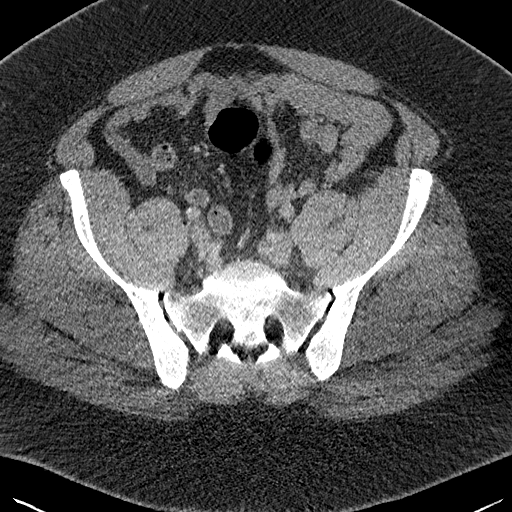
[im 77/165  soft-tissue]
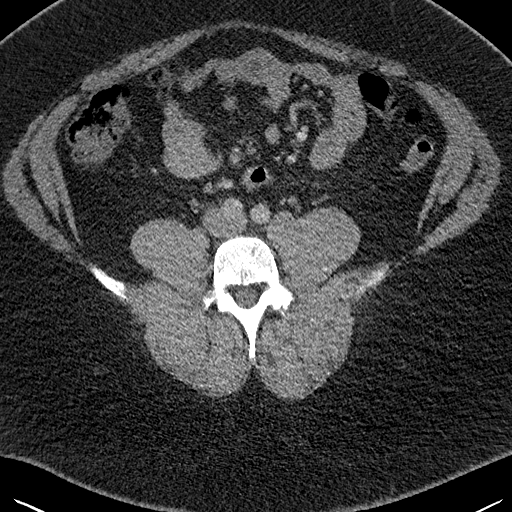
[im 88/165  soft-tissue]
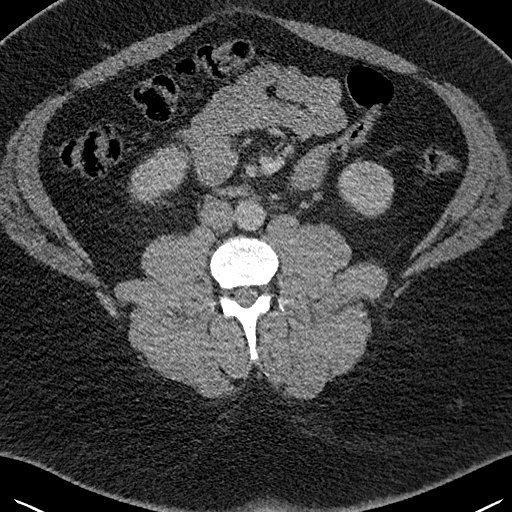
[im 110/165  soft-tissue]
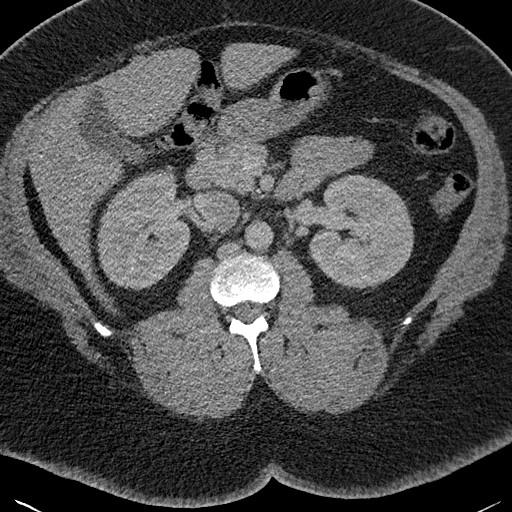
[im 121/165  lung]
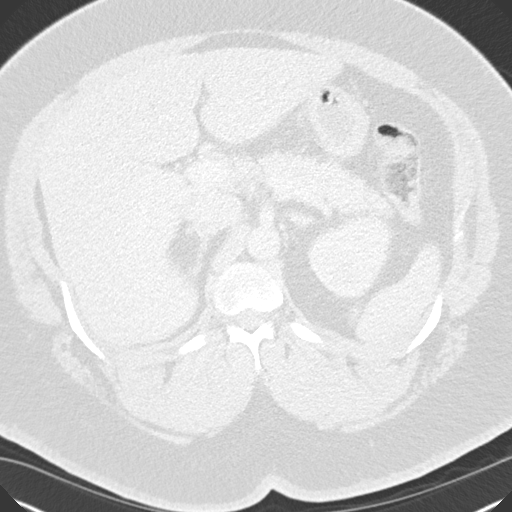
[im 132/165  soft-tissue]
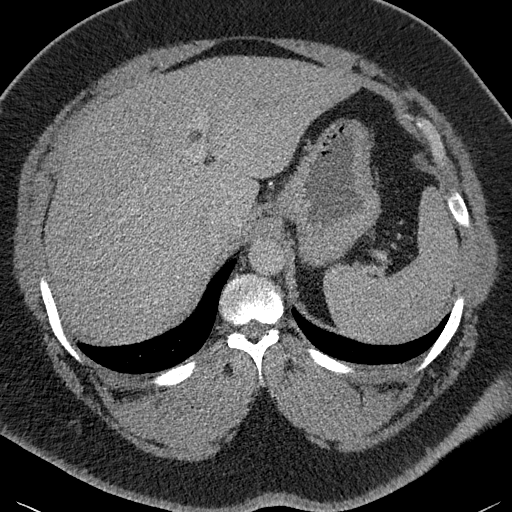
[im 132/165  lung]
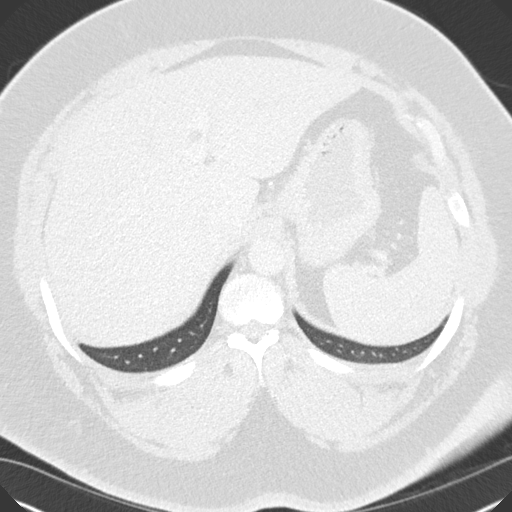
[im 143/165  lung]
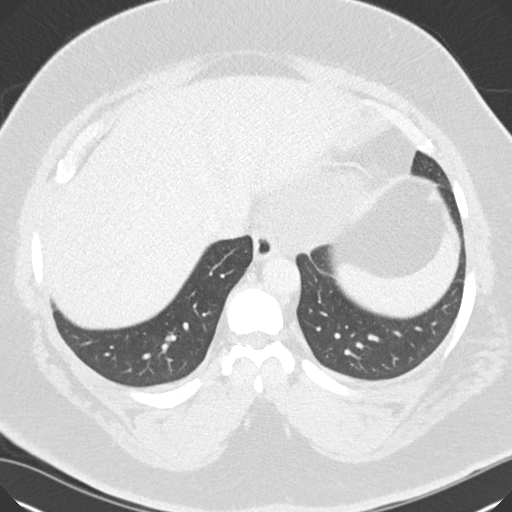
[im 154/165  soft-tissue]
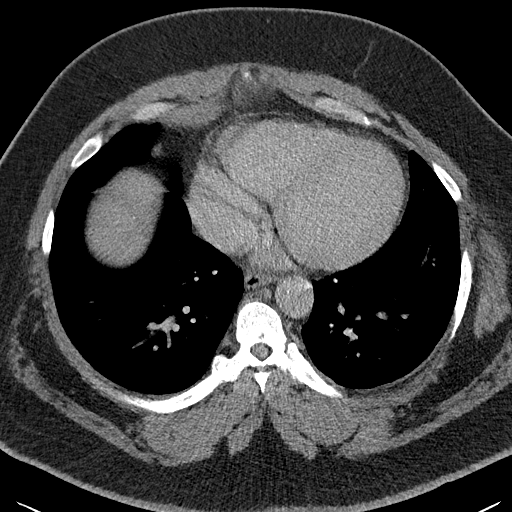
[im 154/165  lung]
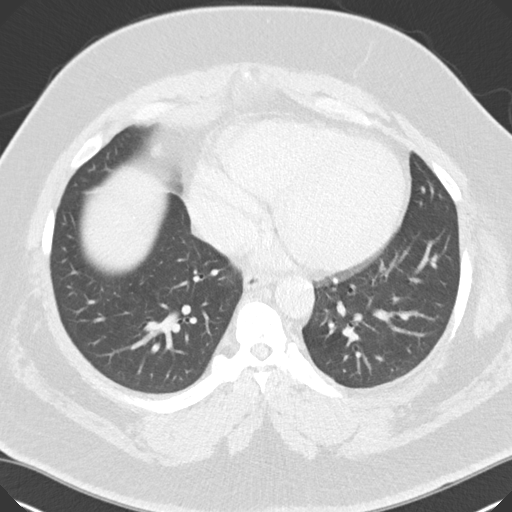

[Series 16: arterial 2.0 mpr cor · coronal · arterial · 0.78mm/px · 1 of 174 slices shown, 2 images]
[im 87/174  soft-tissue]
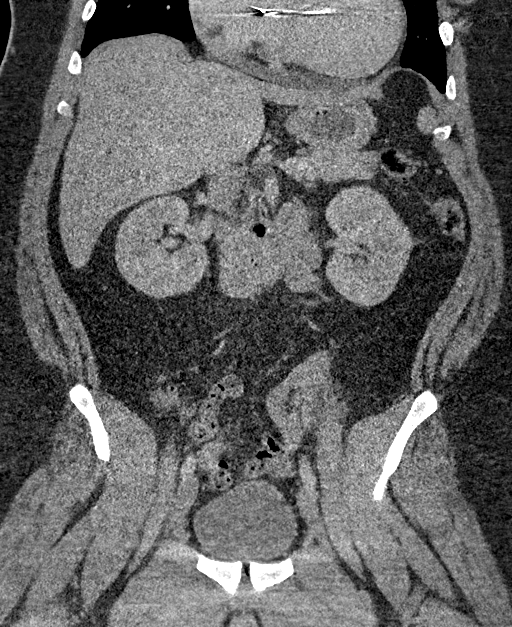
[im 87/174  bone]
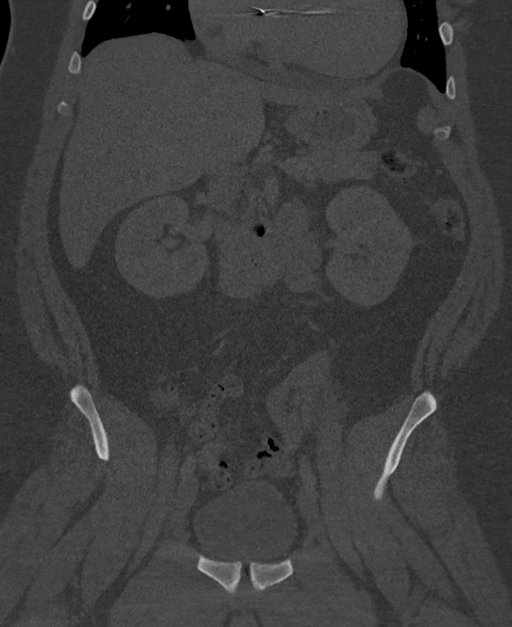

[11 of 46 positions shown; findings below may reference images not displayed]

FINDINGS: CTA CHEST FINDINGS

Cardiovascular: Left subclavian transvenous leads extend to the
right atrium and right ventricular apex. Mild cardiomegaly. No
pericardial effusion. Dilated central pulmonary arteries. Fair
contrast opacification of the pulmonary arterial tree, with no
convincing filling defects; the exam was not optimized for detection
of pulmonary emboli.

Good contrast opacification of the thoracic aorta. No dissection or
stenosis. No significant atheromatous irregularity. Maximum
transverse dimensions as follows:

5 cm sinuses of Valsalva

3.6 cm Tiger junction

4.2 cm mid ascending

3.7 cm distal ascending/proximal arch

3 cm distal arch/proximal descending

2.6 cm distal descending

Bovine variant brachiocephalic arterial origin anatomy without
proximal stenosis. Visualized portions of proximal abdominal aorta
unremarkable.

Mediastinum/Nodes: No hilar or mediastinal adenopathy.

Lungs/Pleura: No pleural effusion. No pneumothorax. Lungs are clear.

Musculoskeletal: No chest wall abnormality. No acute or significant
osseous findings.

Review of the MIP images confirms the above findings.

CTA ABDOMEN AND PELVIS FINDINGS

VASCULAR

Aorta: Normal caliber aorta without aneurysm, dissection, vasculitis
or significant stenosis.

Celiac: Patent without evidence of aneurysm, dissection, vasculitis
or significant stenosis.

SMA: Patent without evidence of aneurysm, dissection, vasculitis or
significant stenosis.

Renals: Single right, widely patent. The main left renal artery is
unremarkable. Limited arterial opacification such that a duplicated
diminutive inferior pole artery could not be confidently excluded.

IMA: Patent without evidence of aneurysm, dissection, vasculitis or
significant stenosis.

Inflow: Patent without evidence of aneurysm, dissection, vasculitis
or significant stenosis. Visualized proximal outflow unremarkable.

Veins: Patent portal vein, SMV, splenic vein, bilateral renal veins.
No venous pathology identified.

Review of the MIP images confirms the above findings.

NON-VASCULAR

Hepatobiliary: No focal liver abnormality is seen. No gallstones,
gallbladder wall thickening, or biliary dilatation.

Pancreas: Unremarkable. No pancreatic ductal dilatation or
surrounding inflammatory changes.

Spleen: Normal in size without focal abnormality.

Adrenals/Urinary Tract: Adrenal glands are unremarkable. Kidneys are
normal, without renal calculi, focal lesion, or hydronephrosis.
Bladder is unremarkable.

Stomach/Bowel: Stomach and small bowel decompressed. Normal
appendix. The colon is nondilated, unremarkable.

Lymphatic: No abdominal or pelvic adenopathy.

Reproductive: Prostate is unremarkable.

Other: Bilateral pelvic phleboliths. No free air. No ascites.

Musculoskeletal: Umbilical hernia containing only mesenteric fat.
Benign L2 vertebral body hemangioma. No fracture or worrisome bone
lesion.

Review of the MIP images confirms the above findings.
IMPRESSION: 1. Dilated aortic root and 4.2 cm ascending thoracic aortic
aneurysm. Recommend annual imaging followup by CTA or MRA. This
recommendation follows 7969
ACCF/AHA/AATS/ACR/ASA/SCA/BENRABAH/GUADUAY/NIHL/ARLOW Guidelines for the
Diagnosis and Management of Patients with Thoracic Aortic Disease.
Circulation. 7969; 121: [id number].
2. Normal abdominal aorta.

## 2018-07-20 MED ORDER — IOPAMIDOL (ISOVUE-370) INJECTION 76%
160.0000 mL | Freq: Once | INTRAVENOUS | Status: AC | PRN
  Administered 2018-07-20: 160 mL via INTRAVENOUS

## 2018-07-20 NOTE — Progress Notes (Signed)
cta

## 2018-07-24 ENCOUNTER — Other Ambulatory Visit: Payer: Self-pay

## 2018-07-24 DIAGNOSIS — I712 Thoracic aortic aneurysm, without rupture: Secondary | ICD-10-CM

## 2018-07-24 DIAGNOSIS — I7121 Aneurysm of the ascending aorta, without rupture: Secondary | ICD-10-CM

## 2018-07-26 ENCOUNTER — Other Ambulatory Visit: Payer: Self-pay

## 2018-07-26 DIAGNOSIS — I712 Thoracic aortic aneurysm, without rupture: Secondary | ICD-10-CM

## 2018-07-26 DIAGNOSIS — I7121 Aneurysm of the ascending aorta, without rupture: Secondary | ICD-10-CM

## 2018-07-26 NOTE — Progress Notes (Signed)
amb  

## 2018-07-27 ENCOUNTER — Telehealth: Payer: Self-pay | Admitting: *Deleted

## 2018-07-27 NOTE — Telephone Encounter (Signed)
Will forward to Dr Royann Shivers for mg of Sertraline, patient aware   No I have I couldn't get a appointment with him until late april.    ----- Message -----  From: Thurmon Fair, MD  Sent: 07/27/18 4:13 PM  To: Danny E Vigen  Subject: RE: Non-Urgent Medical Question    Danny , according to my records, you are already on celexa (citalopram) and wellbutrin (bupropion) and xanax (alprazolam).  Because of the tikosyn, there are fewer choices of antidepressants and anti-anxiety meds that are safe.  We could try switching the celexa to zoloft (sertraline) and see if that works better for you.   Have you discussed this with Dr. Louanne Skye?  Dr. Salena Saner      ----- Message -----   From: Danny Hinton   Sent: 07/27/2018 3:17 PM EDT    To: Thurmon Fair, MD  Subject: RE: Non-Urgent Medical Question    Hey dr.c I was wanting to know is their something I can take for my depression and stress? This I stay feeling down and gave out. All i want to do is sleep and eat.

## 2018-07-27 NOTE — Telephone Encounter (Signed)
Stop citalopram and start sertraline 50 mg at bedtime daily. Will plan to increase to 100 mg in a couple of weeks if well tolerated. MCr

## 2018-07-28 MED ORDER — SERTRALINE HCL 50 MG PO TABS: 50.0000 mg | ORAL_TABLET | Freq: Every day | ORAL | 5 refills | Status: DC

## 2018-07-28 NOTE — Telephone Encounter (Signed)
Advised patient, verbalized understanding  

## 2018-08-03 ENCOUNTER — Ambulatory Visit: Payer: Medicaid Other | Admitting: Family Medicine

## 2018-08-03 ENCOUNTER — Other Ambulatory Visit: Payer: Self-pay

## 2018-08-03 ENCOUNTER — Encounter: Payer: Self-pay | Admitting: Family Medicine

## 2018-08-03 ENCOUNTER — Ambulatory Visit: Payer: Medicare Other | Admitting: Family Medicine

## 2018-08-03 VITALS — BP 132/82 | HR 70 | Temp 98.4°F | Ht 77.0 in | Wt >= 6400 oz

## 2018-08-03 DIAGNOSIS — Z7901 Long term (current) use of anticoagulants: Secondary | ICD-10-CM

## 2018-08-03 DIAGNOSIS — I480 Paroxysmal atrial fibrillation: Secondary | ICD-10-CM

## 2018-08-03 DIAGNOSIS — Z6841 Body Mass Index (BMI) 40.0 and over, adult: Secondary | ICD-10-CM | POA: Diagnosis not present

## 2018-08-03 DIAGNOSIS — E782 Mixed hyperlipidemia: Secondary | ICD-10-CM

## 2018-08-03 DIAGNOSIS — I471 Supraventricular tachycardia: Secondary | ICD-10-CM

## 2018-08-03 DIAGNOSIS — F339 Major depressive disorder, recurrent, unspecified: Secondary | ICD-10-CM | POA: Diagnosis not present

## 2018-08-03 DIAGNOSIS — I10 Essential (primary) hypertension: Secondary | ICD-10-CM

## 2018-08-03 DIAGNOSIS — I5022 Chronic systolic (congestive) heart failure: Secondary | ICD-10-CM | POA: Diagnosis not present

## 2018-08-03 LAB — COAGUCHEK XS/INR WAIVED
INR: 1.5 — ABNORMAL HIGH (ref 0.9–1.1)
Prothrombin Time: 17.5 s

## 2018-08-03 MED ORDER — BUPROPION HCL ER (XL) 150 MG PO TB24: 150.0000 mg | ORAL_TABLET | Freq: Every day | ORAL | 3 refills | Status: DC

## 2018-08-03 MED ORDER — SACUBITRIL-VALSARTAN 49-51 MG PO TABS: 1.0000 | ORAL_TABLET | Freq: Two times a day (BID) | ORAL | 3 refills | Status: DC

## 2018-08-03 MED ORDER — DIGOXIN 125 MCG PO TABS: 125.0000 ug | ORAL_TABLET | ORAL | 2 refills | Status: DC

## 2018-08-03 MED ORDER — CARVEDILOL 25 MG PO TABS: 37.5000 mg | ORAL_TABLET | Freq: Two times a day (BID) | ORAL | 3 refills | Status: DC

## 2018-08-03 MED ORDER — ATORVASTATIN CALCIUM 40 MG PO TABS: 40.0000 mg | ORAL_TABLET | Freq: Every day | ORAL | 3 refills | Status: DC

## 2018-08-03 MED ORDER — WARFARIN SODIUM 5 MG PO TABS: ORAL_TABLET | ORAL | 3 refills | Status: DC

## 2018-08-03 MED ORDER — FUROSEMIDE 40 MG PO TABS: 40.0000 mg | ORAL_TABLET | Freq: Every day | ORAL | 3 refills | Status: DC

## 2018-08-03 MED ORDER — METOPROLOL TARTRATE 25 MG PO TABS: ORAL_TABLET | ORAL | 3 refills | Status: DC

## 2018-08-03 NOTE — Progress Notes (Signed)
BP 132/82   Pulse 70   Temp 98.4 F (36.9 C) (Oral)   Ht _0  (1.956 m)   Wt (!) 454 lb 9.6 oz (206.2 kg)   BMI 53.91 kg/m    Subjective:   Patient ID: Danny Hinton, male    DOB: [birth year], 38 y.o.   MRN: [id number]  HPI: Danny E Diedrich is a 38 y.o. male presenting on 08/03/2018 for Coagulation Disorder and Medical Management of Chronic Issues (chronic check up of medical conditons)   HPI A. fib and Coumadin recheck for long-term anticoagulation Patient is coming in for A. fib and Coumadin recheck today.  He has been checking his INR at home and call him in cardiology but they have not been calling him as frequently and have not been doing that.  He is coming in today for his regular routine checkup and to do an INR visit.  He denies any bruising or bleeding or chest pain or palpitations more than he has been having.  He does have regular follow-up with cardiology.  Hyperlipidemia Patient is coming in for recheck of his hyperlipidemia. The patient is currently taking atorvastatin. They deny any issues with myalgias or history of liver damage from it. They deny any focal numbness or weakness or chest pain.   Hypertension Patient is currently on metoprolol and carvedilol and Entresto, he is not taking the Tikosyn yet and digoxin, this is being managed by cardiology, and their blood pressure today is 132/82. Patient does have some orthostatic dizziness that he has at times but they have adjusted his medications and he is doing better. Patient denies headaches, blurred vision, chest pains, shortness of breath, or weakness. Denies any side effects from medication and is content with current medication.   Depression recheck Patient is also coming in for depression recheck.  He says his cardiologist just adjusted his medication a couple days ago and gave him sertraline 50 which she just started a week ago and he is still on the Wellbutrin and he says he is still been very anxious and having some  mood things but he just started it so it has not been in his system yet.  We will give it some more time to evaluate in the future.  Relevant past medical, surgical, family and social history reviewed and updated as indicated. Interim medical history since our last visit reviewed. Allergies and medications reviewed and updated.  Review of Systems  Constitutional: Negative for chills and fever.  HENT: Negative for congestion.   Eyes: Negative for visual disturbance.  Respiratory: Negative for cough, shortness of breath and wheezing.   Cardiovascular: Positive for palpitations. Negative for chest pain and leg swelling.  Musculoskeletal: Negative for back pain and gait problem.  Skin: Negative for rash.  Neurological: Positive for dizziness and light-headedness. Negative for syncope, weakness and numbness.  All other systems reviewed and are negative.   Per HPI unless specifically indicated above   Allergies as of 08/03/2018   No Known Allergies     Medication List       Accurate as of August 03, 2018  3:03 PM. Always use your most recent med list.        ALPRAZolam 1 MG tablet Commonly known as:  Xanax Take 1 tablet (1 mg total) by mouth 3 (three) times daily as needed for anxiety.   atorvastatin 40 MG tablet Commonly known as:  LIPITOR Take 1 tablet (40 mg total) by mouth daily with breakfast. (Needs visit  with PCP)   buPROPion 150 MG 24 hr tablet Commonly known as:  WELLBUTRIN XL Take 1 tablet (150 mg total) by mouth daily.   carvedilol 25 MG tablet Commonly known as:  COREG Take 1.5 tablets (37.5 mg total) by mouth 2 (two) times daily with a meal.   digoxin 0.125 MG tablet Commonly known as:  LANOXIN TAKE 1 TABLET BY MOUTH EVERY OTHER DAY   dofetilide 500 MCG capsule Commonly known as:  Tikosyn Take 1 capsule (500 mcg total) by mouth 2 (two) times daily.   fluticasone 50 MCG/ACT nasal spray Commonly known as:  FLONASE Place 1 spray into both nostrils as needed.    furosemide 40 MG tablet Commonly known as:  LASIX Take 1 tablet (40 mg total) by mouth daily.   metoprolol tartrate 25 MG tablet Commonly known as:  LOPRESSOR Take 1 tablet as needed for palpitations   sacubitril-valsartan 49-51 MG Commonly known as:  Entresto Take 1 tablet by mouth 2 (two) times daily.   sertraline 50 MG tablet Commonly known as:  ZOLOFT Take 50 mg by mouth daily.   triamcinolone cream 0.1 % Commonly known as:  KENALOG Apply topically 2 (two) times daily as needed.   warfarin 5 MG tablet Commonly known as:  COUMADIN Take as directed by the anticoagulation clinic. If you are unsure how to take this medication, talk to your nurse or doctor. Original instructions:  TAKE 1 TABLET BY MOUTH ONCE DAILY EXCEPT  TAKE  1  &  1/2  TABLET  EACH  FRIDAY  OR  AS  DIRECTED  BY  ANTICOAGULATION  CLINIC        Objective:   BP 132/82   Pulse 70   Temp 98.4 F (36.9 C) (Oral)   Ht '6\' 5"'$  (1.956 m)   Wt (!) 454 lb 9.6 oz (206.2 kg)   BMI 53.91 kg/m   Wt Readings from Last 3 Encounters:  08/03/18 (!) 454 lb 9.6 oz (206.2 kg)  05/23/18 (!) 451 lb (204.6 kg)  03/29/18 (!) 447 lb (202.8 kg)    Physical Exam Vitals signs and nursing note reviewed.  Constitutional:      General: He is not in acute distress.    Appearance: He is well-developed. He is not diaphoretic.  Eyes:     General: No scleral icterus.    Conjunctiva/sclera: Conjunctivae normal.  Neck:     Thyroid: No thyromegaly.  Cardiovascular:     Rate and Rhythm: Normal rate. Rhythm irregular.     Heart sounds: Normal heart sounds. No murmur.  Pulmonary:     Effort: Pulmonary effort is normal. No respiratory distress.     Breath sounds: Normal breath sounds. No wheezing.  Musculoskeletal: Normal range of motion.  Skin:    General: Skin is warm and dry.     Findings: No rash.  Neurological:     Mental Status: He is alert and oriented to person, place, and time.     Coordination: Coordination normal.   Psychiatric:        Behavior: Behavior normal.    Description   Thin today increase to take 1.5 tablets each Monday, Wednesday, and Friday 1 tablet all other days of the week.  Patient INR 1.5 repeat in 4 weeks      Assessment & Plan:   Problem List Items Addressed This Visit      Cardiovascular and Mediastinum   Paroxysmal atrial fibrillation (HCC)   Relevant Medications   carvedilol (COREG) 25  MG tablet   metoprolol tartrate (LOPRESSOR) 25 MG tablet   digoxin (LANOXIN) 0.125 MG tablet   warfarin (COUMADIN) 5 MG tablet   atorvastatin (LIPITOR) 40 MG tablet   furosemide (LASIX) 40 MG tablet   sacubitril-valsartan (ENTRESTO) 49-51 MG   Other Relevant Orders   CoaguChek XS/INR Waived   CBC with Differential/Platelet   PFY92+KMQK   Chronic systolic congestive heart failure (HCC)   Relevant Medications   carvedilol (COREG) 25 MG tablet   metoprolol tartrate (LOPRESSOR) 25 MG tablet   digoxin (LANOXIN) 0.125 MG tablet   warfarin (COUMADIN) 5 MG tablet   atorvastatin (LIPITOR) 40 MG tablet   furosemide (LASIX) 40 MG tablet   sacubitril-valsartan (ENTRESTO) 49-51 MG   Other Relevant Orders   CBC with Differential/Platelet   CMP14+EGFR   Hypertension   Relevant Medications   carvedilol (COREG) 25 MG tablet   metoprolol tartrate (LOPRESSOR) 25 MG tablet   digoxin (LANOXIN) 0.125 MG tablet   warfarin (COUMADIN) 5 MG tablet   atorvastatin (LIPITOR) 40 MG tablet   furosemide (LASIX) 40 MG tablet   sacubitril-valsartan (ENTRESTO) 49-51 MG   Other Relevant Orders   CMP14+EGFR     Other   Long term current use of anticoagulant therapy - Primary   Relevant Medications   warfarin (COUMADIN) 5 MG tablet   Other Relevant Orders   Lipid panel   Hyperlipidemia   Relevant Medications   carvedilol (COREG) 25 MG tablet   metoprolol tartrate (LOPRESSOR) 25 MG tablet   digoxin (LANOXIN) 0.125 MG tablet   warfarin (COUMADIN) 5 MG tablet   atorvastatin (LIPITOR) 40 MG tablet    furosemide (LASIX) 40 MG tablet   sacubitril-valsartan (ENTRESTO) 49-51 MG   Other Relevant Orders   Lipid panel   Class 3 severe obesity due to excess calories with body mass index (BMI) of 50.0 to 59.9 in adult [facility])   Relevant Orders   Lipid panel   Depression, recurrent (HCC)   Relevant Medications   sertraline (ZOLOFT) 50 MG tablet   buPROPion (WELLBUTRIN XL) 150 MG 24 hr tablet    Other Visit Diagnoses    Atrial tachycardia (HCC)       Relevant Medications   carvedilol (COREG) 25 MG tablet   metoprolol tartrate (LOPRESSOR) 25 MG tablet   digoxin (LANOXIN) 0.125 MG tablet   warfarin (COUMADIN) 5 MG tablet   atorvastatin (LIPITOR) 40 MG tablet   furosemide (LASIX) 40 MG tablet   sacubitril-valsartan (ENTRESTO) 49-51 MG   Other Relevant Orders   CMP14+EGFR    Continue current medications, adjusted Coumadin dosing based on INR.  Follow up plan: Return in about 4 weeks (around 08/31/2018), or if symptoms worsen or fail to improve, for INR recheck in 4 weeks.  Counseling provided for all of the vaccine components Orders Placed This Encounter  Procedures  . CoaguChek XS/INR [place], MD [facility] 08/03/2018, 3:03 PM

## 2018-08-04 LAB — CBC WITH DIFFERENTIAL/PLATELET
Basophils Absolute: 0 10*3/uL (ref 0.0–0.2)
Basos: 0 %
EOS (ABSOLUTE): 0.2 10*3/uL (ref 0.0–0.4)
Eos: 5 %
Hematocrit: 37.6 % (ref 37.5–51.0)
Hemoglobin: 12.5 g/dL — ABNORMAL LOW (ref 13.0–17.7)
Immature Grans (Abs): 0 10*3/uL (ref 0.0–0.1)
Immature Granulocytes: 0 %
Lymphocytes Absolute: 1.8 10*3/uL (ref 0.7–3.1)
Lymphs: 38 %
MCH: 28 pg (ref 26.6–33.0)
MCHC: 33.2 g/dL (ref 31.5–35.7)
MCV: 84 fL (ref 79–97)
Monocytes Absolute: 0.6 10*3/uL (ref 0.1–0.9)
Monocytes: 14 %
NEUTROS ABS: 2 10*3/uL (ref 1.4–7.0)
Neutrophils: 43 %
Platelets: 252 10*3/uL (ref 150–450)
RBC: 4.47 x10E6/uL (ref 4.14–5.80)
RDW: 14.7 % (ref 11.6–15.4)
WBC: 4.7 10*3/uL (ref 3.4–10.8)

## 2018-08-04 LAB — CMP14+EGFR
ALT: 26 IU/L (ref 0–44)
AST: 12 IU/L (ref 0–40)
Albumin/Globulin Ratio: 1.3 (ref 1.2–2.2)
Albumin: 4.3 g/dL (ref 4.0–5.0)
Alkaline Phosphatase: 78 IU/L (ref 39–117)
BUN/Creatinine Ratio: 12 (ref 9–20)
BUN: 10 mg/dL (ref 6–20)
Bilirubin Total: 0.8 mg/dL (ref 0.0–1.2)
CO2: 23 mmol/L (ref 20–29)
Calcium: 8.9 mg/dL (ref 8.7–10.2)
Chloride: 100 mmol/L (ref 96–106)
Creatinine, Ser: 0.83 mg/dL (ref 0.76–1.27)
GFR calc Af Amer: 130 mL/min/{1.73_m2} (ref >59)
GFR calc non Af Amer: 112 mL/min/{1.73_m2} (ref >59)
Globulin, Total: 3.3 g/dL (ref 1.5–4.5)
Glucose: 102 mg/dL — ABNORMAL HIGH (ref 65–99)
Potassium: 4.4 mmol/L (ref 3.5–5.2)
SODIUM: 140 mmol/L (ref 134–144)
Total Protein: 7.6 g/dL (ref 6.0–8.5)

## 2018-08-04 LAB — LIPID PANEL
Chol/HDL Ratio: 3.2 ratio (ref 0.0–5.0)
Cholesterol, Total: 125 mg/dL (ref 100–199)
HDL: 39 mg/dL — ABNORMAL LOW (ref >39)
LDL CALC: 67 mg/dL (ref 0–99)
Triglycerides: 93 mg/dL (ref 0–149)
VLDL CHOLESTEROL CAL: 19 mg/dL (ref 5–40)

## 2018-08-07 ENCOUNTER — Telehealth: Payer: Self-pay | Admitting: Family Medicine

## 2018-08-08 MED ORDER — LORATADINE 10 MG PO TABS: 10.0000 mg | ORAL_TABLET | Freq: Every day | ORAL | 11 refills | Status: DC

## 2018-08-08 NOTE — Telephone Encounter (Signed)
I sent the Claritin for the patient

## 2018-08-09 DIAGNOSIS — G4733 Obstructive sleep apnea (adult) (pediatric): Secondary | ICD-10-CM | POA: Diagnosis not present

## 2018-08-29 ENCOUNTER — Other Ambulatory Visit: Payer: Self-pay | Admitting: Family Medicine

## 2018-08-29 DIAGNOSIS — I480 Paroxysmal atrial fibrillation: Secondary | ICD-10-CM

## 2018-08-29 DIAGNOSIS — I10 Essential (primary) hypertension: Secondary | ICD-10-CM

## 2018-08-29 MED ORDER — DIGOXIN 125 MCG PO TABS: 125.0000 ug | ORAL_TABLET | ORAL | 2 refills | Status: DC

## 2018-08-29 NOTE — Telephone Encounter (Signed)
Refill sent pt aware. 

## 2018-09-01 ENCOUNTER — Ambulatory Visit (INDEPENDENT_AMBULATORY_CARE_PROVIDER_SITE_OTHER): Payer: Medicaid Other | Admitting: Family Medicine

## 2018-09-01 ENCOUNTER — Other Ambulatory Visit: Payer: Self-pay

## 2018-09-01 ENCOUNTER — Encounter: Payer: Self-pay | Admitting: Family Medicine

## 2018-09-01 ENCOUNTER — Telehealth: Payer: Self-pay | Admitting: Cardiology

## 2018-09-01 DIAGNOSIS — I480 Paroxysmal atrial fibrillation: Secondary | ICD-10-CM

## 2018-09-01 DIAGNOSIS — Z7901 Long term (current) use of anticoagulants: Secondary | ICD-10-CM

## 2018-09-01 MED ORDER — SACUBITRIL-VALSARTAN 49-51 MG PO TABS: 1.0000 | ORAL_TABLET | Freq: Two times a day (BID) | ORAL | 3 refills | Status: DC

## 2018-09-01 NOTE — Telephone Encounter (Signed)
Video/doxy.me/smartphone/verbal consent 4.24./30/vitals  YOUR CARDIOLOGY TEAM HAS ARRANGED FOR AN E-VISIT FOR YOUR APPOINTMENT - PLEASE REVIEW IMPORTANT INFORMATION BELOW SEVERAL DAYS PRIOR TO YOUR APPOINTMENT  Due to the recent COVID-19 pandemic, we are transitioning in-person office visits to tele-medicine visits in an effort to decrease unnecessary exposure to our patients, their families, and staff. These visits are billed to your insurance just like a normal visit is. We also encourage you to sign up for MyChart if you have not already done so. You will need a smartphone if possible. For patients that do not have this, we can still complete the visit using a regular telephone but do prefer a smartphone to enable video when possible. You may have a family member that lives with you that can help. If possible, we also ask that you have a blood pressure cuff and scale at home to measure your blood pressure, heart rate and weight prior to your scheduled appointment. Patients with clinical needs that need an in-person evaluation and testing will still be able to come to the office if absolutely necessary. If you have any questions, feel free to call our office.      YOUR PROVIDER WILL BE USING THE FOLLOWING PLATFORM TO COMPLETE YOUR VISIT:  Doxy. Me      IF USING MYCHART - How to Download the MyChart App to Your SmartPhone   - If Apple, go to [employer] and type in MyChart in the search bar and download the app. If Android, ask patient to go to [employer] and type in [place] in the search bar and download the app. The app is free but as with any other app downloads, your phone may require you to verify saved payment information or Apple/Android password.  - You will need to then log into the app with your MyChart username and password, and select [facility] as your healthcare provider to link the account.  - When it is time for your visit, go to the MyChart app, find appointments, and  click Begin Video Visit. Be sure to Select Allow for your device to access the Microphone and Camera for your visit. You will then be connected, and your provider will be with you shortly.  **If you have any issues connecting or need assistance, please contact MyChart service desk [phone])**  **If using a computer, in order to ensure the best quality for your visit, you will need to use either of the following Internet Browsers: Agricultural consultant or Microsoft Edge**   IF USING DOXIMITY or [email or web address].ME - The staff will give you instructions on receiving your link to join the meeting the day of your visit.      2-3 DAYS BEFORE YOUR APPOINTMENT  You will receive a telephone call from one of our [facility] team members - your caller ID may say "Unknown caller." If this is a video visit, we will walk you through how to get the video launched on your phone. We will remind you check your blood pressure, heart rate and weight prior to your scheduled appointment. If you have an Apple Watch or Kardia, please upload any pertinent ECG strips the day before or morning of your appointment to MyChart. Our staff will also make sure you have reviewed the consent and agree to move forward with your scheduled tele-health visit.     THE DAY OF YOUR APPOINTMENT  Approximately 15 minutes prior to your scheduled appointment, you will receive a telephone call from one of [facility]  team - your caller ID may say "Unknown caller."  Our staff will confirm medications, vital signs for the day and any symptoms you may be experiencing. Please have this information available prior to the time of visit start. It may also be helpful for you to have a pad of paper and pen handy for any instructions given during your visit. They will also walk you through joining the smartphone meeting if this is a video visit.    CONSENT FOR TELE-HEALTH VISIT - PLEASE REVIEW  I hereby voluntarily request, consent and authorize [facility] and its employed or contracted physicians, physician assistants, nurse practitioners or other licensed health care professionals (the Practitioner), to provide me with telemedicine health care services (the Services") as deemed necessary by the treating Practitioner. I acknowledge and consent to receive the Services by the Practitioner via telemedicine. I understand that the telemedicine visit will involve communicating with the Practitioner through live audiovisual communication technology and the disclosure of certain medical information by electronic transmission. I acknowledge that I have been given the opportunity to request an in-person assessment or other available alternative prior to the telemedicine visit and am voluntarily participating in the telemedicine visit.  I understand that I have the right to withhold or withdraw my consent to the use of telemedicine in the course of my care at any time, without affecting my right to future care or treatment, and that the Practitioner or I may terminate the telemedicine visit at any time. I understand that I have the right to inspect all information obtained and/or recorded in the course of the telemedicine visit and may receive copies of available information for a reasonable fee.  I understand that some of the potential risks of receiving the Services via telemedicine include:   Delay or interruption in medical evaluation due to technological equipment failure or disruption;  Information transmitted may not be sufficient (e.g. poor resolution of images) to allow for appropriate medical decision making by the Practitioner; and/or   In rare instances, security protocols could fail, causing a breach of personal health information.  Furthermore, I acknowledge that it is my responsibility to provide information about my medical history, conditions and care that is complete and accurate to the best of my ability. I acknowledge that Practitioner's  advice, recommendations, and/or decision may be based on factors not within their control, such as incomplete or inaccurate data provided by me or distortions of diagnostic images or specimens that may result from electronic transmissions. I understand that the practice of medicine is not an exact science and that Practitioner makes no warranties or guarantees regarding treatment outcomes. I acknowledge that I will receive a copy of this consent concurrently upon execution via email to the email address I last provided but may also request a printed copy by calling the office of [facility].    I understand that my insurance will be billed for this visit.   I have read or had this consent read to me.  I understand the contents of this consent, which adequately explains the benefits and risks of the Services being provided via telemedicine.   I have been provided ample opportunity to ask questions regarding this consent and the Services and have had my questions answered to my satisfaction.  I give my informed consent for the services to be provided through the use of telemedicine in my medical care  By participating in this telemedicine visit I agree to the above.

## 2018-09-01 NOTE — Progress Notes (Signed)
Virtual Visit via telephone Note  I connected with Danny Hinton on 09/01/18 at 1004 by telephone and verified that I am speaking with the correct person using two identifiers. Danny SONG GENNUSA is currently located at home and no other people are currently with her during visit. The provider, Elige Radon Eural Holzschuh, MD is located in their office at time of visit.  Call ended at 1011  I discussed the limitations, risks, security and privacy concerns of performing an evaluation and management service by telephone and the availability of in person appointments. I also discussed with the patient that there may be a patient responsible charge related to this service. The patient expressed understanding and agreed to proceed.   History and Present Illness: Anticoagulation recheck Patient says he is not having any bleeding or bruising or palpitations.  He has not had any new or abnormal chest pain or lightheadedness compared to what he is had previously.  He is following up with his cardiology and getting his ICD checked regularly.  No diagnosis found.  Outpatient Encounter Medications as of 09/01/2018  Medication Sig  . ALPRAZolam (XANAX) 1 MG tablet Take 1 tablet (1 mg total) by mouth 3 (three) times daily as needed for anxiety.  Marland Kitchen atorvastatin (LIPITOR) 40 MG tablet Take 1 tablet (40 mg total) by mouth daily with breakfast. (Needs visit with PCP)  . buPROPion (WELLBUTRIN XL) 150 MG 24 hr tablet Take 1 tablet (150 mg total) by mouth daily.  . carvedilol (COREG) 25 MG tablet Take 1.5 tablets (37.5 mg total) by mouth 2 (two) times daily with a meal.  . digoxin (LANOXIN) 0.125 MG tablet Take 1 tablet (125 mcg total) by mouth every other day.  . dofetilide (TIKOSYN) 500 MCG capsule Take 1 capsule (500 mcg total) by mouth 2 (two) times daily.  . fluticasone (FLONASE) 50 MCG/ACT nasal spray Place 1 spray into both nostrils as needed.  . furosemide (LASIX) 40 MG tablet Take 1 tablet (40 mg total) by mouth daily.   Marland Kitchen loratadine (CLARITIN) 10 MG tablet Take 1 tablet (10 mg total) by mouth daily.  . metoprolol tartrate (LOPRESSOR) 25 MG tablet Take 1 tablet as needed for palpitations  . sacubitril-valsartan (ENTRESTO) 49-51 MG Take 1 tablet by mouth 2 (two) times daily.  . sertraline (ZOLOFT) 50 MG tablet Take 50 mg by mouth daily.  Marland Kitchen triamcinolone cream (KENALOG) 0.1 % Apply topically 2 (two) times daily as needed.  . warfarin (COUMADIN) 5 MG tablet TAKE 1 TABLET BY MOUTH ONCE DAILY EXCEPT  TAKE  1  &  1/2  TABLET  EACH  FRIDAY  OR  AS  DIRECTED  BY  ANTICOAGULATION  CLINIC   No facility-administered encounter medications on file as of 09/01/2018.     Review of Systems  Constitutional: Negative for chills and fever.  Eyes: Negative for visual disturbance.  Respiratory: Negative for shortness of breath and wheezing.   Cardiovascular: Negative for chest pain, palpitations and leg swelling.  Gastrointestinal: Negative for anal bleeding and blood in stool.  Genitourinary: Negative for hematuria.  Musculoskeletal: Negative for back pain and gait problem.  Skin: Negative for rash.  Neurological: Negative for weakness and headaches.  All other systems reviewed and are negative.   Observations/Objective: Patient sounds comfortable and in no acute distress  Assessment and Plan: Problem List Items Addressed This Visit      Cardiovascular and Mediastinum   Paroxysmal atrial fibrillation (HCC)   Relevant Medications   sacubitril-valsartan (ENTRESTO) 49-51  MG     Other   Long term current use of anticoagulant therapy      Description   continue to take 1.5 tablets each Monday, Wednesday, and Friday 1 tablet all other days of the week.  Patient INR 2.3 repeat in 4 weeks     Follow Up Instructions: Follow up inr in 4 weeks    I discussed the assessment and treatment plan with the patient. The patient was provided an opportunity to ask questions and all were answered. The patient agreed with the  plan and demonstrated an understanding of the instructions.   The patient was advised to call back or seek an in-person evaluation if the symptoms worsen or if the condition fails to improve as anticipated.  The above assessment and management plan was discussed with the patient. The patient verbalized understanding of and has agreed to the management plan. Patient is aware to call the clinic if symptoms persist or worsen. Patient is aware when to return to the clinic for a follow-up visit. Patient educated on when it is appropriate to go to the emergency department.    I provided 7 minutes of non-face-to-face time during this encounter.    Nils Pyle, MD

## 2018-09-04 ENCOUNTER — Encounter: Payer: Self-pay | Admitting: Cardiology

## 2018-09-04 DIAGNOSIS — G4733 Obstructive sleep apnea (adult) (pediatric): Secondary | ICD-10-CM

## 2018-09-04 HISTORY — DX: Obstructive sleep apnea (adult) (pediatric): G47.33

## 2018-09-04 NOTE — Progress Notes (Signed)
Virtual Visit via Video Note   This visit type was conducted due to national recommendations for restrictions regarding the COVID-19 Pandemic (e.g. social distancing) in an effort to limit this patient's exposure and mitigate transmission in our community.  Due to his co-morbid illnesses, this patient is at least at moderate risk for complications without adequate follow up.  This format is felt to be most appropriate for this patient at this time.  All issues noted in this document were discussed and addressed.  A limited physical exam was performed with this format.  Please refer to the patient's chart for his consent to telehealth for [facility].   Evaluation Performed:  Follow-up visit  This visit type was conducted due to national recommendations for restrictions regarding the COVID-19 Pandemic (e.g. social distancing).  This format is felt to be most appropriate for this patient at this time.  All issues noted in this document were discussed and addressed.  No physical exam was performed (except for noted visual exam findings with Video Visits).  Please refer to the patient's chart (MyChart message for video visits and phone note for telephone visits) for the patient's consent to telehealth for [facility].  Date:  09/05/2018   ID:  Danny Hinton, DOB 1981-01-27, MRN [id number]  Patient Location:  Home  Provider location:   [place]  PCP:  Dettinger, Elige Radon, MD  Cardiologist:  Thurmon Fair, MD  Sleep Medicine: Armanda Magic, MD Electrophysiologist:  Will Jorja Loa, MD   Chief Complaint:  OSA  History of Present Illness:    Danny Hinton is a 38 y.o. male who presents via audio/video conferencing for a telehealth visit today.    This is a 38 year old morbidly obese AA male followed by Dr. Royann Shivers for hypertension, paroxysmal atrial fibrillation and atrial flutter, chronic systolic CHF and hypertension.  He has a history of excessive daytime sleepiness with chronic  fatigue and snoring.  His Epworth sleepiness score was elevated at 10.  He underwent split-night sleep study on 02/24/2018 which showed severe obstructive sleep apnea with an AHI of 67.2/h and nocturnal hypoxemia with oxygen saturations as low as 82%.  He was initially titrated to CPAP but due to ongoing respiratory events underwent BiPAP titration.  He was titrated to 18/14 cm H2O.  He is doing well with his BiPAP device and thinks that he has gotten used to it.  He tolerates the mask and feels the pressure is adequate.  Since going on BiPAP he feels rested in the am and has no significant daytime sleepiness.  He denies any significant mouth or nasal dryness or nasal congestion.  He does not think that he snores.    The patient does not have symptoms concerning for COVID-19 infection (fever, chills, cough, or new shortness of breath).   Prior CV studies:   The following studies were reviewed today:  BiPAP compliance download  Past Medical History:  Diagnosis Date  . Allergy   . Anxiety   . Atypical atrial flutter (HCC) 08/19/14   pace terminated in device clinic.  AFL cycle length was 390 msec  . Automatic implantable cardioverter-defibrillator in situ    a. St Jude in 2011.  Marland Kitchen Chronic anticoagulation   . Chronic systolic congestive heart failure (HCC)    a. suspected NICM - dx 2010. EF 15% by TEE, 10-20% by echo at that time. b. s/p St. Jude AICD 2011. c. Echo 01/2010: mod dilated LV, EF 30%, mod aortic root dilitation, no significant  valvular disease.  . Eczema   . Eczema   . Hypertension   . Mobitz type 2 second degree atrioventricular block    a. During sleeping hours in 2010 suspected due to ? OSA.  . Morbid obesity (HCC)   . Nonischemic cardiomyopathy (HCC)   . OSA treated with BiPAP 09/04/2018   Severe obstructive sleep apnea with an AHI of 67.2/h and nocturnal hypoxemia with oxygen saturations as low as 82%. Now on BIPAP at  18/14 cm H2O.  Marland Kitchen PAF (paroxysmal atrial fibrillation)  (HCC)   . Paroxysmal atrial flutter [facility])    s/p ablation   Past Surgical History:  Procedure Laterality Date  . ATRIAL FLUTTER ABLATION N/A 01/18/2014   Procedure: ATRIAL FLUTTER ABLATION;  Surgeon: Duke Salvia, MD;  Location: [facility] CATH LAB;  Service: Cardiovascular;  Laterality: N/A;  . CARDIAC DEFIBRILLATOR PLACEMENT  02/17/10   St. Jude Medical 45DR, model number [id number], serial number [id number]  . CARDIOVERSION N/A 12/29/2013   Procedure: CARDIOVERSION;  Surgeon: Duke Salvia, MD;  Location: [facility] OR;  Service: Cardiovascular;  Laterality: N/A;  . TOOTH EXTRACTION N/A 10/23/2012   Procedure: EXTRACTION TEETH 1, 16, 17, 30, 31;  Surgeon: [name], DDS;  Location: [facility] OR;  Service: Oral Surgery;  Laterality: N/A;     No outpatient medications have been marked as taking for the 09/05/18 encounter (Appointment) with Quintella Reichert, MD.     Allergies:   Patient has no known allergies.   Social History   Tobacco Use  . Smoking status: Never Smoker  . Smokeless tobacco: Never Used  Substance Use Topics  . Alcohol use: No  . Drug use: No     Family Hx: The patient's family history includes Diabetes in his paternal grandmother; Heart Problems in his mother; Heart disease in his maternal grandfather and mother; Hodgkin's lymphoma (age of onset: 47) in his brother.  ROS:   Please see the history of present illness.     All other systems reviewed and are negative.   Labs/Other Tests and Data Reviewed:    Recent Labs: 01/11/2018: Magnesium 2.0 08/03/2018: ALT 26; BUN 10; Creatinine, Ser 0.83; Hemoglobin 12.5; Platelets 252; Potassium 4.4; Sodium 140   Recent Lipid Panel Lab Results  Component Value Date/Time   CHOL 125 08/03/2018 03:28 PM   TRIG 93 08/03/2018 03:28 PM   HDL 39 (L) 08/03/2018 03:28 PM   CHOLHDL 3.2 08/03/2018 03:28 PM   CHOLHDL 5.3 03/20/2009 04:10 AM   LDLCALC 67 08/03/2018 03:28 PM   LDLDIRECT 52 03/26/2015 08:24 AM    Wt Readings from Last 3  Encounters:  08/03/18 (!) 454 lb 9.6 oz (206.2 kg)  05/23/18 (!) 451 lb (204.6 kg)  03/29/18 (!) 447 lb (202.8 kg)     Objective:    Vital Signs:  There were no vitals taken for this visit.   CONSTITUTIONAL:  Well nourished, well developed male in no acute distress.  EYES: anicteric MOUTH: oral mucosa is pink RESPIRATORY: Normal respiratory effort, symmetric expansion CARDIOVASCULAR: No peripheral edema SKIN: No rash, lesions or ulcers MUSCULOSKELETAL: no digital cyanosis NEURO: Cranial Nerves II-XII grossly intact, moves all extremities PSYCH: Intact judgement and insight.  A&O x 3, Mood/affect appropriate   ASSESSMENT & PLAN:    1.  OSA - the patient is tolerating PAP therapy well without any problems. The PAP download was reviewed today and showed an AHI of 0.9/hr on 18/14 cm H2O with 60% compliance in using more than 4  hours nightly.  The patient has been using and benefiting from PAP use and will continue to benefit from therapy.  I encouraged him to be more compliant with his device in a.m. for using at least 6 hours nightly.  He says that the first 2 weeks of the past month he had a hard time tolerating it but the past 2 weeks have been much better.   2.  HTN - his BP is controlled on exam today.  He will continue on carvedilol 37.5 mg twice daily and Entresto 49-51 mg twice daily.  3.  Morbid obesity - I have encouraged him to get into a routine exercise program and cut back on carbs and portions.   4.  COVID-19 Education:The signs and symptoms of COVID-19 were discussed with the patient and how to seek care for testing (follow up with PCP or arrange E-visit).  The importance of social distancing was discussed today.  Patient Risk:   After full review of this patient's clinical status, I feel that they are at least moderate risk at this time.  Time:   Today, I have spent 20 minutes directly with the patient on video discussing medical problems including OSA, HTN, obesity.   We also reviewed the symptoms of COVID 19 and the ways to protect against contracting the virus with telehealth technology.  I spent an additional 5 minutes reviewing patient's chart including PAP compliance download.  Medication Adjustments/Labs and Tests Ordered: Current medicines are reviewed at length with the patient today.  Concerns regarding medicines are outlined above.  Tests Ordered: No orders of the defined types were placed in this encounter.  Medication Changes: No orders of the defined types were placed in this encounter.   Disposition:  Follow up in 1 year(s)  Signed, Armanda Magic, MD  09/05/2018 10:21 AM    [facility]

## 2018-09-05 ENCOUNTER — Other Ambulatory Visit: Payer: Self-pay

## 2018-09-05 ENCOUNTER — Telehealth (INDEPENDENT_AMBULATORY_CARE_PROVIDER_SITE_OTHER): Payer: Medicaid Other | Admitting: Cardiology

## 2018-09-05 ENCOUNTER — Encounter: Payer: Medicaid Other | Admitting: *Deleted

## 2018-09-05 DIAGNOSIS — G4733 Obstructive sleep apnea (adult) (pediatric): Secondary | ICD-10-CM

## 2018-09-05 DIAGNOSIS — I10 Essential (primary) hypertension: Secondary | ICD-10-CM

## 2018-09-05 DIAGNOSIS — Z6841 Body Mass Index (BMI) 40.0 and over, adult: Secondary | ICD-10-CM

## 2018-09-05 DIAGNOSIS — Z7189 Other specified counseling: Secondary | ICD-10-CM | POA: Diagnosis not present

## 2018-09-05 DIAGNOSIS — E66813 Obesity, class 3: Secondary | ICD-10-CM

## 2018-09-05 NOTE — Patient Instructions (Signed)

## 2018-09-06 ENCOUNTER — Telehealth: Payer: Self-pay | Admitting: *Deleted

## 2018-09-06 NOTE — Telephone Encounter (Signed)
Order placed to [facility]. 

## 2018-09-06 NOTE — Telephone Encounter (Signed)
-----   Message from Dustin Flock, RN sent at 09/05/2018 12:12 PM EDT ----- Dr. Mayford Knife ordered a new chin strap, please send. Thanks, [employer]

## 2018-09-08 DIAGNOSIS — G4733 Obstructive sleep apnea (adult) (pediatric): Secondary | ICD-10-CM | POA: Diagnosis not present

## 2018-09-08 MED ORDER — SERTRALINE HCL 100 MG PO TABS: 100.0000 mg | ORAL_TABLET | Freq: Every day | ORAL | 11 refills | Status: DC

## 2018-09-08 NOTE — Addendum Note (Signed)
Addended by: Jarvis Newcomer on: 09/08/2018 02:50 PM   Modules accepted: Orders

## 2018-09-08 NOTE — Telephone Encounter (Signed)
Per Dr.Croitoru's instruction Rx  below refilled   Please send in a new Rx for sertraline 100 mg at bedtime daily. #30, 11 RF  [facility]

## 2018-09-08 NOTE — Telephone Encounter (Signed)
Routed to MD and covering nurse.

## 2018-09-22 ENCOUNTER — Other Ambulatory Visit: Payer: Self-pay

## 2018-09-25 ENCOUNTER — Encounter: Payer: Self-pay | Admitting: Surgery

## 2018-09-25 ENCOUNTER — Institutional Professional Consult (permissible substitution) (INDEPENDENT_AMBULATORY_CARE_PROVIDER_SITE_OTHER): Payer: Medicaid Other | Admitting: Surgery

## 2018-09-25 ENCOUNTER — Other Ambulatory Visit: Payer: Self-pay

## 2018-09-25 VITALS — BP 104/64 | HR 62 | Temp 98.4°F | Resp 16 | Ht 77.0 in | Wt >= 6400 oz

## 2018-09-25 DIAGNOSIS — Z95810 Presence of automatic (implantable) cardiac defibrillator: Secondary | ICD-10-CM | POA: Diagnosis not present

## 2018-09-25 DIAGNOSIS — I712 Thoracic aortic aneurysm, without rupture, unspecified: Secondary | ICD-10-CM

## 2018-09-25 DIAGNOSIS — I471 Supraventricular tachycardia: Secondary | ICD-10-CM | POA: Diagnosis not present

## 2018-09-25 DIAGNOSIS — Z7901 Long term (current) use of anticoagulants: Secondary | ICD-10-CM

## 2018-09-25 DIAGNOSIS — I5022 Chronic systolic (congestive) heart failure: Secondary | ICD-10-CM | POA: Diagnosis not present

## 2018-09-25 NOTE — Progress Notes (Signed)
Cardiothoracic Surgery Consultation  PCP is Danny Hinton, Danny Hinton Referring Provider is Danny Hinton, Danny Hinton  Chief Complaint  Patient presents with  . TAA    new consult.Marland KitchenMarland KitchenCTA C/A/P 07/28/18 ECHO 01/27/18    HPI:  The patient is a 38 year old super morbidly obese gentleman with a history of nonischemic cardiomyopathy diagnosed in 2010 at which time his ejection fraction was 15% by echocardiogram.  He underwent insertion of a Saint Jude ICD in 2011.  A follow-up echocardiogram at that time showed some improvement in his ejection fraction to 30%.  He was noted to have some aortic root dilatation on echocardiogram at that time.  He has also had paroxysmal atrial fibrillation and atypical atrial flutter and underwent an ablation which helped briefly but he had return of his atrial flutter.  He has been on chronic anticoagulation.  He also has history of Mobitz type II second-degree AV block, hypertension, and obstructive sleep apnea and was recently started on BiPAP therapy by Danny Hinton.  He has been followed by Dr. Royann Hinton for his cardiology problems and had a CTA of the chest in March 2017 to follow-up on his aortic root enlargement.  The diameter at the sinus level was measured at 4.4 cm at that time.  He had a repeat CTA of the chest on 07/20/2018 and the diameter at the sinus of Valsalva level is measured at 5 cm.  The sinotubular junction was 3.6 cm and the mid ascending aorta was 4.2 cm.  The patient lives with his wife.  He has been on disability in the past but has been having some problems with getting approved for continued disability.  He says that he has always had problems with obesity but did lose about 100 pounds when he was ill with congestive heart failure but he has gained all that weight back.  He is trying to get approved for bariatric surgery.  There is no family history of aortic aneurysm or connective tissue disorder.  There is no family history of aortic dissection.  Past  Medical History:  Diagnosis Date  . Allergy   . Anxiety   . Atypical atrial flutter (HCC) 08/19/14   pace terminated in device clinic.  AFL cycle length was 390 msec  . Automatic implantable cardioverter-defibrillator in situ    a. St Jude in 2011.  Marland Kitchen Chronic anticoagulation   . Chronic systolic congestive heart failure (HCC)    a. suspected NICM - dx 2010. EF 15% by TEE, 10-20% by echo at that time. b. s/p St. Jude AICD 2011. c. Echo 01/2010: mod dilated LV, EF 30%, mod aortic root dilitation, no significant valvular disease.  . Eczema   . Eczema   . Hypertension   . Mobitz type 2 second degree atrioventricular block    a. During sleeping hours in 2010 suspected due to ? OSA.  . Morbid obesity (HCC)   . Nonischemic cardiomyopathy (HCC)   . OSA treated with BiPAP 09/04/2018   Severe obstructive sleep apnea with an AHI of 67.2/h and nocturnal hypoxemia with oxygen saturations as low as 82%. Now on BIPAP at  18/14 cm H2O.  Marland Kitchen PAF (paroxysmal atrial fibrillation) (HCC)   . Paroxysmal atrial flutter [facility])    s/p ablation    Past Surgical History:  Procedure Laterality Date  . ATRIAL FLUTTER ABLATION N/A 01/18/2014   Procedure: ATRIAL FLUTTER ABLATION;  Surgeon: Danny Hinton, Danny Hinton;  Location: [facility] CATH LAB;  Service: Cardiovascular;  Laterality: N/A;  . CARDIAC  DEFIBRILLATOR PLACEMENT  02/17/10   St. Jude Medical 45DR, model number [id number], serial number [id number]  . CARDIOVERSION N/A 12/29/2013   Procedure: CARDIOVERSION;  Surgeon: Danny Hinton, Danny Hinton;  Location: [facility] OR;  Service: Cardiovascular;  Laterality: N/A;  . TOOTH EXTRACTION N/A 10/23/2012   Procedure: EXTRACTION TEETH 1, 16, 17, 30, 31;  Surgeon: Danny Hinton, DDS;  Location: [facility] OR;  Service: Oral Surgery;  Laterality: N/A;    Family History  Problem Relation Age of Onset  . Hodgkin's lymphoma Brother 75  . Heart disease Mother   . Heart Problems Mother        atrial flutter  . Heart disease Maternal Grandfather   . Diabetes  Paternal Grandmother     Social History Social History   Tobacco Use  . Smoking status: Never Smoker  . Smokeless tobacco: Never Used  Substance Use Topics  . Alcohol use: No  . Drug use: No    Current Outpatient Medications  Medication Sig Dispense Refill  . ALPRAZolam (XANAX) 1 MG tablet Take 1 tablet (1 mg total) by mouth 3 (three) times daily as needed for anxiety. 30 tablet 0  . atorvastatin (LIPITOR) 40 MG tablet Take 1 tablet (40 mg total) by mouth daily with breakfast. (Needs visit with PCP) 90 tablet 3  . buPROPion (WELLBUTRIN XL) 150 MG 24 hr tablet Take 1 tablet (150 mg total) by mouth daily. 90 tablet 3  . carvedilol (COREG) 25 MG tablet Take 1.5 tablets (37.5 mg total) by mouth 2 (two) times daily with a meal. 270 tablet 3  . digoxin (LANOXIN) 0.125 MG tablet Take 1 tablet (125 mcg total) by mouth every other day. 45 tablet 2  . fluticasone (FLONASE) 50 MCG/ACT nasal spray Place 1 spray into both nostrils as needed. 16 g 11  . furosemide (LASIX) 40 MG tablet Take 1 tablet (40 mg total) by mouth daily. 90 tablet 3  . loratadine (CLARITIN) 10 MG tablet Take 1 tablet (10 mg total) by mouth daily. 30 tablet 11  . metoprolol tartrate (LOPRESSOR) 25 MG tablet Take 1 tablet as needed for palpitations 90 tablet 3  . sacubitril-valsartan (ENTRESTO) 49-51 MG Take 1 tablet by mouth 2 (two) times daily. 180 tablet 3  . sertraline (ZOLOFT) 100 MG tablet Take 1 tablet (100 mg total) by mouth at bedtime. 30 tablet 11  . sertraline (ZOLOFT) 50 MG tablet Take 50 mg by mouth daily.    Marland Kitchen triamcinolone cream (KENALOG) 0.1 % Apply topically 2 (two) times daily as needed. 453.6 g 2  . warfarin (COUMADIN) 5 MG tablet TAKE 1 TABLET BY MOUTH ONCE DAILY EXCEPT  TAKE  1  &  1/2  TABLET  EACH  FRIDAY  OR  AS  DIRECTED  BY  ANTICOAGULATION  CLINIC 100 tablet 3  . dofetilide (TIKOSYN) 500 MCG capsule Take 1 capsule (500 mcg total) by mouth 2 (two) times daily. (Patient not taking: Reported on 09/25/2018)  60 capsule 0   No current facility-administered medications for this visit.     No Known Allergies  Review of Systems  Constitutional: Positive for fatigue and unexpected weight change. Negative for appetite change.       Weight gain  HENT: Negative.   Eyes: Negative.   Respiratory: Positive for chest tightness and shortness of breath.   Cardiovascular: Positive for chest pain and palpitations. Negative for leg swelling.  Gastrointestinal:       Frequent heartburn  Endocrine: Negative.   Genitourinary: Negative.  Musculoskeletal: Positive for arthralgias.  Skin: Negative.   Neurological: Positive for dizziness.       Memory problems  Hematological: Negative.   Psychiatric/Behavioral:       Anxiety and depression    BP 104/64 (BP Location: Right Arm, Patient Position: Sitting, Cuff Size: Large)   Pulse 62   Temp 98.4 F (36.9 C) (Oral)   Resp 16   Ht 6\' 5"  (1.956 m)   Wt (!) 450 lb (204.1 kg)   SpO2 95% Comment: RA  BMI 53.36 kg/m  Physical Exam Constitutional:      Appearance: He is obese.  HENT:     Head: Normocephalic and atraumatic.  Eyes:     Extraocular Movements: Extraocular movements intact.     Conjunctiva/sclera: Conjunctivae normal.     Pupils: Pupils are equal, round, and reactive to light.  Neck:     Musculoskeletal: Normal range of motion and neck supple.     Vascular: No carotid bruit.  Cardiovascular:     Rate and Rhythm: Normal rate and regular rhythm.     Heart sounds: Normal heart sounds. No murmur.  Pulmonary:     Effort: Pulmonary effort is normal.     Breath sounds: Normal breath sounds.  Abdominal:     General: There is no distension.     Tenderness: There is no abdominal tenderness.  Musculoskeletal:        General: No swelling or tenderness.  Lymphadenopathy:     Cervical: No cervical adenopathy.  Skin:    General: Skin is warm and dry.  Neurological:     General: No focal deficit present.     Mental Status: He is alert and  oriented to person, place, and time.  Psychiatric:        Mood and Affect: Mood normal.        Behavior: Behavior normal.        Thought Content: Thought content normal.        Judgment: Judgment normal.      Diagnostic Tests:  CLINICAL DATA:  Thoracic aortic aneurysm (TAA), known, follow up  EXAM: CT ANGIOGRAPHY CHEST, ABDOMEN AND PELVIS  TECHNIQUE: Multidetector CT imaging through the chest, abdomen and pelvis was performed using the standard protocol during bolus administration of intravenous contrast. Multiplanar reconstructed images and MIPs were obtained and reviewed to evaluate the vascular anatomy.  CONTRAST:  ISOVUE-370 IOPAMIDOL (ISOVUE-370) INJECTION 76%  COMPARISON:  08/07/2015  FINDINGS: CTA CHEST FINDINGS  Cardiovascular: Left subclavian transvenous leads extend to the right atrium and right ventricular apex. Mild cardiomegaly. No pericardial effusion. Dilated central pulmonary arteries. Fair contrast opacification of the pulmonary arterial tree, with no convincing filling defects; the exam was not optimized for detection of pulmonary emboli.  Good contrast opacification of the thoracic aorta. No dissection or stenosis. No significant atheromatous irregularity. Maximum transverse dimensions as follows:  5 cm sinuses of Valsalva  3.6 cm sino-tubular junction  4.2 cm mid ascending  3.7 cm distal ascending/proximal arch  3 cm distal arch/proximal descending  2.6 cm distal descending  Bovine variant brachiocephalic arterial origin anatomy without proximal stenosis. Visualized portions of proximal abdominal aorta unremarkable.  Mediastinum/Nodes: No hilar or mediastinal adenopathy.  Lungs/Pleura: No pleural effusion. No pneumothorax. Lungs are clear.  Musculoskeletal: No chest wall abnormality. No acute or significant osseous findings.  Review of the MIP images confirms the above findings.  CTA ABDOMEN AND PELVIS  FINDINGS  VASCULAR  Aorta: Normal caliber aorta without aneurysm, dissection, vasculitis or  significant stenosis.  Celiac: Patent without evidence of aneurysm, dissection, vasculitis or significant stenosis.  SMA: Patent without evidence of aneurysm, dissection, vasculitis or significant stenosis.  Renals: Single right, widely patent. The main left renal artery is unremarkable. Limited arterial opacification such that a duplicated diminutive inferior pole artery could not be confidently excluded.  IMA: Patent without evidence of aneurysm, dissection, vasculitis or significant stenosis.  Inflow: Patent without evidence of aneurysm, dissection, vasculitis or significant stenosis. Visualized proximal outflow unremarkable.  Veins: Patent portal vein, SMV, splenic vein, bilateral renal veins. No venous pathology identified.  Review of the MIP images confirms the above findings.  NON-VASCULAR  Hepatobiliary: No focal liver abnormality is seen. No gallstones, gallbladder wall thickening, or biliary dilatation.  Pancreas: Unremarkable. No pancreatic ductal dilatation or surrounding inflammatory changes.  Spleen: Normal in size without focal abnormality.  Adrenals/Urinary Tract: Adrenal glands are unremarkable. Kidneys are normal, without renal calculi, focal lesion, or hydronephrosis. Bladder is unremarkable.  Stomach/Bowel: Stomach and small bowel decompressed. Normal appendix. The colon is nondilated, unremarkable.  Lymphatic: No abdominal or pelvic adenopathy.  Reproductive: Prostate is unremarkable.  Other: Bilateral pelvic phleboliths. No free air. No ascites.  Musculoskeletal: Umbilical hernia containing only mesenteric fat. Benign L2 vertebral body hemangioma. No fracture or worrisome bone lesion.  Review of the MIP images confirms the above findings.  IMPRESSION: 1. Dilated aortic root and 4.2 cm ascending thoracic aortic aneurysm.  Recommend annual imaging followup by CTA or MRA. This recommendation follows 2010 ACCF/AHA/AATS/ACR/ASA/SCA/SCAI/SIR/STS/SVM Guidelines for the Diagnosis and Management of Patients with Thoracic Aortic Disease. Circulation. 2010; 121: Z610-R604. 2. Normal abdominal aorta.   Electronically Signed   By: Corlis Leak M.D.   On: 07/20/2018 12:24                           *[facility]*                        [address]                        [place], [place] [address]                            [phone]  ------------------------------------------------------------------- Transthoracic Echocardiography  Patient:    Hinton, Danny E MR #:       [id number] Study Date: 01/27/2018 Gender:     M Age:        37 Height:     195.6 cm Weight:     202.8 kg BSA:        3.42 m^2 Pt. Status: Room:   SONOGRAPHER  [place], Will  ORDERING     Thurmon Fair, Danny Hinton  REFERRING    Thurmon Fair, Danny Hinton  ATTENDING    Tobias Alexander, M.D.  PERFORMING   [facility], Outpatient  cc:  ------------------------------------------------------------------- LV EF: 40%  ------------------------------------------------------------------- Indications:      (I50.22).  ------------------------------------------------------------------- History:   PMH:  SVT. Non ischemic cardiomyopathy. Acquired from the patient and from the patient&'s chart.  Atrial fibrillation. Congestive heart failure.  Risk factors:  Hypertension.  ------------------------------------------------------------------- Study Conclusions  - Left ventricle: The cavity size was mildly dilated. There was   mild concentric hypertrophy. Systolic function was normal. The   estimated ejection fraction was 40%. Wall motion was normal;   there were no regional wall motion abnormalities.  Doppler   parameters are consistent with abnormal left ventricular   relaxation (grade 1 diastolic dysfunction). There was no evidence   of elevated  ventricular filling pressure by Doppler parameters. - Aortic valve: There was no regurgitation. - Aortic root: The aortic root was normal in size. - Mitral valve: There was mild regurgitation. Valve area by   pressure half-time: 2.22 cm^2. - Left atrium: The atrium was mildly dilated. - Right ventricle: Pacer wire or catheter noted in right ventricle.   Systolic function was normal. - Right atrium: Pacer wire or catheter noted in right atrium. - Tricuspid valve: There was mild regurgitation. - Pulmonic valve: There was mild regurgitation. - Inferior vena cava: The vessel was normal in size. - Pericardium, extracardiac: There was no pericardial effusion.  Impressions:  - There has been no significant change since the prior study on   07/31/2015, LVEF remains moderately decreased estimated at 40%.   Aortic root is dilated at the sinus level with maximum diameter   45 mm. Ascending aortic size is stable at 40 mm.  ------------------------------------------------------------------- Study data:  Comparison was made to the study of 07/31/2015.  Study status:  Routine.  Procedure:  The patient reported no pain pre or post test. Transthoracic echocardiography for left ventricular function evaluation. Image quality was adequate.  Study completion:  There were no complications.          Transthoracic echocardiography.  M-mode, complete 2D, spectral Doppler, and color Doppler.  Birthdate:  Patient birthdate: Dec 09, 1980.  Age:  Patient is 38 yr old.  Sex:  Gender: male.    BMI: 53 kg/m^2.  Blood pressure:     124/72  Patient status:  Outpatient.  Study date: Study date: 01/27/2018. Study time: 11:34 AM.  Location:  [facility]  -------------------------------------------------------------------  ------------------------------------------------------------------- Left ventricle:  The cavity size was mildly dilated. There was mild concentric hypertrophy. Systolic function was  normal. The estimated ejection fraction was 40%. Wall motion was normal; there were no regional wall motion abnormalities. Doppler parameters are consistent with abnormal left ventricular relaxation (grade 1 diastolic dysfunction). There was no evidence of elevated ventricular filling pressure by Doppler parameters.  ------------------------------------------------------------------- Aortic valve:   Trileaflet; normal thickness leaflets. Mobility was not restricted.  Doppler:  Transvalvular velocity was within the normal range. There was no stenosis. There was no regurgitation.   ------------------------------------------------------------------- Aorta:  Aortic root: The aortic root was normal in size.  ------------------------------------------------------------------- Mitral valve:   Structurally normal valve.   Mobility was not restricted.  Doppler:  Transvalvular velocity was within the normal range. There was no evidence for stenosis. There was mild regurgitation.    Valve area by pressure half-time: 2.22 cm^2. Indexed valve area by pressure half-time: 0.65 cm^2/m^2.    Peak gradient (D): 2 mm Hg.  ------------------------------------------------------------------- Left atrium:  The atrium was mildly dilated.  ------------------------------------------------------------------- Right ventricle:  The cavity size was normal. Wall thickness was normal. Pacer wire or catheter noted in right ventricle. Systolic function was normal.  ------------------------------------------------------------------- Pulmonic valve:    Structurally normal valve.   Cusp separation was normal.  Doppler:  Transvalvular velocity was within the normal range. There was no evidence for stenosis. There was mild regurgitation.  ------------------------------------------------------------------- Tricuspid valve:   Structurally normal valve.    Doppler: Transvalvular velocity was within the normal  range. There was mild regurgitation.  ------------------------------------------------------------------- Pulmonary artery:   The main pulmonary artery was normal-sized. Systolic pressure was within the normal range.  ------------------------------------------------------------------- Right atrium:  The atrium was normal in size. Pacer wire or catheter noted in right atrium.  ------------------------------------------------------------------- Pericardium:  There was no pericardial effusion.  ------------------------------------------------------------------- Systemic veins: Inferior vena cava: The vessel was normal in size.  ------------------------------------------------------------------- Measurements   Left ventricle                         Value          Reference  LV ID, ED, PLAX chordal        (H)     60    mm       43 - 52  LV ID, ES, PLAX chordal        (H)     48    mm       23 - 38  LV fx shortening, PLAX chordal (L)     20    %        >=29  LV PW thickness, ED                    14    mm       ----------  IVS/LV PW ratio, ED                    0.79           <=1.3  Stroke volume, 2D                      130   ml       ----------  Stroke volume/bsa, 2D                  38    ml/m^2   ----------  LV e&', lateral                         11.7  cm/s     ----------  LV E/e&', lateral                       6.69           ----------  LV e&', medial                          7.29  cm/s     ----------  LV E/e&', medial                        10.74          ----------  LV e&', average                         9.5   cm/s     ----------  LV E/e&', average                       8.25           ----------    Ventricular septum                     Value          Reference  IVS thickness, ED                      11    mm       ----------  LVOT                                   Value          Reference  LVOT ID, S                             29    mm       ----------  LVOT  area                              6.61  cm^2     ----------  LVOT ID                                29    mm       ----------  LVOT peak velocity, S                  83.6  cm/s     ----------  LVOT mean velocity, S                  60.2  cm/s     ----------  LVOT VTI, S                            19.7  cm       ----------  Stroke volume (SV), LVOT DP            130.1 ml       ----------  Stroke index (SV/bsa), LVOT DP         38.1  ml/m^2   ----------    Aorta                                  Value          Reference  Aortic root ID, ED                     45    mm       ----------  Ascending aorta ID, A-P, S             40    mm       ----------    Left atrium                            Value          Reference  LA ID, A-P, ES                         43    mm       ----------  LA ID/bsa, A-P                         1.26  cm/m^2   <=2.2  LA volume, S                           75.2  ml       ----------  LA volume/bsa,  S                       22    ml/m^2   ----------  LA volume, ES, 1-p A4C                 55.8  ml       ----------  LA volume/bsa, ES, 1-p A4C             16.3  ml/m^2   ----------  LA volume, ES, 1-p A2C                 94.9  ml       ----------  LA volume/bsa, ES, 1-p A2C             27.8  ml/m^2   ----------    Mitral valve                           Value          Reference  Mitral E-wave peak velocity            78.3  cm/s     ----------  Mitral A-wave peak velocity            65.5  cm/s     ----------  Mitral deceleration time       (H)     338   ms       150 - 230  Mitral pressure half-time              99    ms       ----------  Mitral peak gradient, D                2     mm Hg    ----------  Mitral E/A ratio, peak                 1.2            ----------  Mitral valve area, PHT, DP             2.22  cm^2     ----------  Mitral valve area/bsa, PHT, DP         0.65  cm^2/m^2 ----------    Pulmonary arteries                     Value          Reference  PA  pressure, S, DP                     26    mm Hg    <=30    Tricuspid valve                        Value          Reference  Tricuspid regurg peak velocity         239   cm/s     ----------  Tricuspid peak RV-RA gradient          23    mm Hg    ----------    Right atrium                           Value          Reference  RA  ID, S-I, ES, A4C            (H)     55    mm       34 - 49  RA area, ES, A4C               (H)     21    cm^2     8.3 - 19.5  RA volume, ES, A/L                     67.2  ml       ----------  RA volume/bsa, ES, A/L                 19.7  ml/m^2   ----------    Systemic veins                         Value          Reference  Estimated CVP                          3     mm Hg    ----------    Right ventricle                        Value          Reference  TAPSE                                  24.2  mm       ----------  RV pressure, S, DP                     26    mm Hg    <=30  RV s&', lateral, S                      19.7  cm/s     ----------  Legend: (L)  and  (H)  mark values outside specified reference range.  ------------------------------------------------------------------- Prepared and Electronically Authenticated by  Tobias Alexander, M.D. 2019-09-20T14:10:42  Impression:  This 38 year old gentleman has an aortic root and ascending aortic aneurysm with a maximum diameter of about 5 cm at the sinus level.  His last CTA of the chest in March 2017 showed the same level to be about 4.4 cm.  Visualization of the aortic root is not ideal because of his super morbid obesity so there may be some variability in the measurements.  He had a 2D echocardiogram in September 2019 and the sinus diameter was 4.5 cm. This is still below the surgical threshold of 5.5 cm and since his blood pressure is under good control, he has a normal trileaflet aortic valve with no regurgitation and he has no other risk factors for aortic dissection I think be best to continue following  this.  He has multiple comorbid risk factors including super morbid obesity, OSA, and nonischemic cardiomyopathy which would increase his surgical risk considerably.  I reviewed the CTA images with him and answered his questions.  I discussed the importance of maintaining good blood pressure control to prevent further enlargement and aortic dissection.  His blood pressure is currently under good control and he said that he does check it at home and it is always been on  the low side.  I think it is reasonable to do another CTA of the chest in 1 year for follow-up.  He is in agreement with that.  I also stressed the importance of weight reduction and decreasing his overall health risk and certainly the risk of surgery if needed in the future.  Plan:  I will see him back in 1 year with a CTA of the chest.  I spent 30 minutes performing this consultation and > 50% of this time was spent face to face counseling and coordinating the care of this patient's aortic root and ascending aortic aneurysm.  Alleen Borne, Danny Hinton [facility] [phone]

## 2018-09-27 ENCOUNTER — Encounter: Payer: Medicaid Other | Admitting: Surgery

## 2018-09-28 ENCOUNTER — Ambulatory Visit (INDEPENDENT_AMBULATORY_CARE_PROVIDER_SITE_OTHER): Payer: Medicaid Other | Admitting: *Deleted

## 2018-09-28 DIAGNOSIS — I420 Dilated cardiomyopathy: Secondary | ICD-10-CM

## 2018-09-28 LAB — CUP PACEART REMOTE DEVICE CHECK
Battery Remaining Longevity: 24 mo
Battery Remaining Percentage: 20 %
Brady Statistic RA Percent Paced: 1 %
Brady Statistic RV Percent Paced: 1 %
Date Time Interrogation Session: 20200521103134
HighPow Impedance: 42 Ohm
Implantable Lead Implant Date: 20111010
Implantable Lead Implant Date: 20111010
Implantable Lead Location: 753859
Implantable Lead Location: 753860
Implantable Pulse Generator Implant Date: 20111010
Lead Channel Impedance Value: 400 Ohm
Lead Channel Impedance Value: 440 Ohm
Lead Channel Sensing Intrinsic Amplitude: 12 mV
Lead Channel Sensing Intrinsic Amplitude: 4.2 mV
Lead Channel Setting Pacing Amplitude: 2.5 V
Lead Channel Setting Pacing Amplitude: 2.5 V
Lead Channel Setting Pacing Pulse Width: 0.5 ms
Lead Channel Setting Sensing Sensitivity: 0.5 mV
Pulse Gen Serial Number: 621444

## 2018-10-09 ENCOUNTER — Encounter: Payer: Self-pay | Admitting: Cardiology

## 2018-10-09 DIAGNOSIS — G4733 Obstructive sleep apnea (adult) (pediatric): Secondary | ICD-10-CM | POA: Diagnosis not present

## 2018-10-09 NOTE — Progress Notes (Signed)
Remote ICD transmission.   

## 2018-10-24 ENCOUNTER — Telehealth: Payer: Self-pay | Admitting: *Deleted

## 2018-10-24 MED ORDER — FUROSEMIDE 40 MG PO TABS: 40.0000 mg | ORAL_TABLET | ORAL | 3 refills | Status: DC

## 2018-10-24 NOTE — Telephone Encounter (Signed)
Call placed to the patient to check on how he was doing. He stated that he was a little better.  He only takes the Metoprolol as needed.   He has been advised to reduce the Furosemide to 40 mg every other day. He will send a message in the next few weeks with an update.   Please check with him and review meds. He should not be taking both carvedilol and metoprolol.  Stop metoprolol if he is on it.  (If that is not the case, please remove metoprolol from his list and have him cut back the furosemide 40 mg to every other day only)  [facility]

## 2018-10-27 NOTE — Telephone Encounter (Signed)
I understand. It is still OK for him to take the metoprolol on an as needed basis, on top of the carvedilol

## 2018-10-30 NOTE — Telephone Encounter (Signed)
Spoke with pt, aware of dr croitoru's recommendations.  

## 2018-11-08 DIAGNOSIS — G4733 Obstructive sleep apnea (adult) (pediatric): Secondary | ICD-10-CM | POA: Diagnosis not present

## 2018-11-15 DIAGNOSIS — G4733 Obstructive sleep apnea (adult) (pediatric): Secondary | ICD-10-CM | POA: Diagnosis not present

## 2018-11-20 ENCOUNTER — Telehealth: Payer: Self-pay | Admitting: Cardiovascular Disease

## 2018-11-20 NOTE — Telephone Encounter (Signed)
I called pt to confirm his appt with  Dr C on 11-21-18.        COVID-19 Pre-Screening Questions:   In the past 7 to 10 days have you had a cough,  shortness of breath, headache, congestion, fever (100 or greater) body aches, chills, sore throat, or sudden loss of taste or sense of smell?  no  Have you been around anyone with known Covid 19.  Have you been around anyone who is awaiting Covid 19 test results in the past 7 to 10 days? no  Have you been around anyone who has been exposed to Covid 19, or has mentioned symptoms of Covid 19 within the past 7 to 10 days?  no  If you have any concerns/questions about symptoms patients report during screening (either on the phone or at threshold). Contact the provider seeing the patient or DOD for further guidance.  If neither are available contact a member of the leadership team.

## 2018-11-21 ENCOUNTER — Ambulatory Visit (INDEPENDENT_AMBULATORY_CARE_PROVIDER_SITE_OTHER): Payer: Medicaid Other | Admitting: Cardiovascular Disease

## 2018-11-21 ENCOUNTER — Encounter: Payer: Self-pay | Admitting: Cardiovascular Disease

## 2018-11-21 ENCOUNTER — Other Ambulatory Visit: Payer: Self-pay

## 2018-11-21 DIAGNOSIS — I480 Paroxysmal atrial fibrillation: Secondary | ICD-10-CM | POA: Diagnosis not present

## 2018-11-21 DIAGNOSIS — Z95810 Presence of automatic (implantable) cardiac defibrillator: Secondary | ICD-10-CM | POA: Diagnosis not present

## 2018-11-21 DIAGNOSIS — I428 Other cardiomyopathies: Secondary | ICD-10-CM | POA: Diagnosis not present

## 2018-11-21 DIAGNOSIS — I471 Supraventricular tachycardia: Secondary | ICD-10-CM | POA: Diagnosis not present

## 2018-11-21 DIAGNOSIS — E782 Mixed hyperlipidemia: Secondary | ICD-10-CM

## 2018-11-21 DIAGNOSIS — I10 Essential (primary) hypertension: Secondary | ICD-10-CM

## 2018-11-21 DIAGNOSIS — Z6841 Body Mass Index (BMI) 40.0 and over, adult: Secondary | ICD-10-CM

## 2018-11-21 DIAGNOSIS — I5022 Chronic systolic (congestive) heart failure: Secondary | ICD-10-CM

## 2018-11-21 DIAGNOSIS — Z7901 Long term (current) use of anticoagulants: Secondary | ICD-10-CM

## 2018-11-21 NOTE — Progress Notes (Signed)
Patient ID: Danny Hinton, male   DOB: 1980/10/21, 38 y.o.   MRN: [id number]    Cardiology Office Note    Date:  11/23/2018   ID:  Danny Hinton, DOB 01/02/1981, MRN [id number]  PCP:  Dettinger, Elige Radon, MD  Cardiologist:   Thurmon Fair, MD   No chief complaint on file.   History of Present Illness:  Danny Hinton is a 38 y.o. male who presents for nonischemic cardiomyopathy, chronic systolic heart failure, defibrillator checkup, history of atrial flutter, atrial fibrillation and paroxysmal atrial tachycardia.  Overall his palpitations are better.  He has not had sustained episodes of atrial tachycardia has been in the past,.  The plan for hospitalization to start treatment with dofetilide was abandoned in the setting of the coronavirus pandemic.  Interrogation of his defibrillator shows a couple of very rapid episodes of high ventricular rates in the VF zone, thankfully brief (max 3 seconds) and not followed by treatment.  One episode is clearly polymorphic VT.  The other one could represent atrial tachycardia with 1: 1 AV conduction.  He continues to have NYHA functional class III exertional dyspnea.  He gets short of breath taking a shower putting his clothes on.  He has not had chest pain.  He is frequently dizzy especially when he changes position.  His blood pressure runs quite low.  He has 1-2+ persistent ankle swelling.  His biggest complaint remains severe fatigue.  He has not had any falls, injuries or bleeding.  He is more and more eager to undergo bariatric surgery.  His 28 year old daughter who is an excellent student is worried about going back to school since she may expose her parents to coronavirus.  Child has severe heart disease and his wife has multiple sclerosis on immunosuppressant drugs.  Interrogation of his ICD otherwise shows normal function.  Estimated device longevity is approximately 1.5-2.0 years.  He has very infrequent atrial or ventricular pacing. Thoracic  impedance has been within normal range and activity remains limited at about 2 hours/day.  The one more encouraging finding is the persistent separation between daytime and nighttime heart rates.  His September 2019 echocardiogram showed an ejection fraction of around 40%.  In September 2015 he underwent cavotricuspid isthmus ablation for atrial flutter. On 08/19/2014, while [place] hunting he develop persistent rapid palpitations and felt unwell. He underwent successful overdrive pacing via his device by Dr. Hillis Range. The rhythm was atypical atrial flutter, cycle length roughly 390 ms. The episode lasted for about 3 hours until he was successfully overdrive paced. In the past he has had paroxysmal atrial fibrillation. The decision was made to ablate his flutter secondary to the occurrence of multiple unnecessary defibrillator shocks in the setting of atrial flutter with rapid ventricular rate.  His St. Jude Fortify DR model [phone] defibrillator is under advisory for possible unexpected battery depletion. He is not device dependent and has never had shocks for true ventricular arrhythmia (although he has had inappropriate shocks for atrial flutter with rapid AV conduction). We discussed the potential for device failure in a low percentage of this particular model. There is no reason to proceed with early generator change out in his particular case.   Past Medical History:  Diagnosis Date  . Allergy   . Anxiety   . Atypical atrial flutter (HCC) 08/19/14   pace terminated in device clinic.  AFL cycle length was 390 msec  . Automatic implantable cardioverter-defibrillator in situ    a. St Jude in  2011.  . Chronic anticoagulation   . Chronic systolic congestive heart failure ([place])    a. suspected NICM - dx 2010. EF 15% by TEE, 10-20% by echo at that time. b. s/p St. Jude AICD 2011. c. Echo 01/2010: mod dilated LV, EF 30%, mod aortic root dilitation, no significant valvular disease.  . Eczema   .  Eczema   . Hypertension   . Mobitz type 2 second degree atrioventricular block    a. During sleeping hours in 2010 suspected due to ? OSA.  . Morbid obesity ([place])   . Nonischemic cardiomyopathy ([place])   . OSA treated with BiPAP 09/04/2018   Severe obstructive sleep apnea with an AHI of 67.2/h and nocturnal hypoxemia with oxygen saturations as low as 82%. Now on BIPAP at  18/14 cm H2O.  Marland Kitchen PAF (paroxysmal atrial fibrillation) ([place])   . Paroxysmal atrial flutter [facility])    s/p ablation    Past Surgical History:  Procedure Laterality Date  . ATRIAL FLUTTER ABLATION N/A 01/18/2014   Procedure: ATRIAL FLUTTER ABLATION;  Surgeon: Deboraha Sprang, MD;  Location: [facility] CATH LAB;  Service: Cardiovascular;  Laterality: N/A;  . CARDIAC DEFIBRILLATOR PLACEMENT  02/17/10   St. Jude Medical 45DR, model number [id number], serial number [id number]  . CARDIOVERSION N/A 12/29/2013   Procedure: CARDIOVERSION;  Surgeon: Deboraha Sprang, MD;  Location: [place];  Service: Cardiovascular;  Laterality: N/A;  . TOOTH EXTRACTION N/A 10/23/2012   Procedure: EXTRACTION TEETH 1, 16, 17, 30, 31;  Surgeon: Gae Bon, DDS;  Location: [place];  Service: Oral Surgery;  Laterality: N/A;    Outpatient Medications Prior to Visit  Medication Sig Dispense Refill  . ALPRAZolam (XANAX) 1 MG tablet Take 1 tablet (1 mg total) by mouth 3 (three) times daily as needed for anxiety. 30 tablet 0  . atorvastatin (LIPITOR) 40 MG tablet Take 1 tablet (40 mg total) by mouth daily with breakfast. (Needs visit with PCP) 90 tablet 3  . buPROPion (WELLBUTRIN XL) 150 MG 24 hr tablet Take 1 tablet (150 mg total) by mouth daily. 90 tablet 3  . carvedilol (COREG) 25 MG tablet Take 1.5 tablets (37.5 mg total) by mouth 2 (two) times daily with a meal. 270 tablet 3  . digoxin (LANOXIN) 0.125 MG tablet Take 1 tablet (125 mcg total) by mouth every other day. 45 tablet 2  . fluticasone (FLONASE) 50 MCG/ACT nasal spray Place 1 spray into both nostrils as needed. 16  g 11  . furosemide (LASIX) 40 MG tablet Take 1 tablet (40 mg total) by mouth every other day. 12 tablet 3  . loratadine (CLARITIN) 10 MG tablet Take 1 tablet (10 mg total) by mouth daily. 30 tablet 11  . metoprolol tartrate (LOPRESSOR) 25 MG tablet Take 1 tablet as needed for palpitations 90 tablet 3  . sacubitril-valsartan (ENTRESTO) 49-51 MG Take 1 tablet by mouth 2 (two) times daily. 180 tablet 3  . sertraline (ZOLOFT) 100 MG tablet Take 1 tablet (100 mg total) by mouth at bedtime. 30 tablet 11  . sertraline (ZOLOFT) 50 MG tablet Take 50 mg by mouth daily.    Marland Kitchen triamcinolone cream (KENALOG) 0.1 % Apply topically 2 (two) times daily as needed. 453.6 g 2  . warfarin (COUMADIN) 5 MG tablet TAKE 1 TABLET BY MOUTH ONCE DAILY EXCEPT  TAKE  1  &  1/2  TABLET  EACH  FRIDAY  OR  AS  DIRECTED  BY  ANTICOAGULATION  CLINIC 100 tablet 3  .  dofetilide (TIKOSYN) 500 MCG capsule Take 1 capsule (500 mcg total) by mouth 2 (two) times daily. 60 capsule 0   No facility-administered medications prior to visit.      Allergies:   Patient has no known allergies.   Social History   Socioeconomic History  . Marital status: Married    Spouse name: Not on file  . Number of children: Not on file  . Years of education: Not on file  . Highest education level: Not on file  Occupational History  . Not on file  Social Needs  . Financial resource strain: Not on file  . Food insecurity    Worry: Not on file    Inability: Not on file  . Transportation needs    Medical: Not on file    Non-medical: Not on file  Tobacco Use  . Smoking status: Never Smoker  . Smokeless tobacco: Never Used  Substance and Sexual Activity  . Alcohol use: No  . Drug use: No  . Sexual activity: Yes  Lifestyle  . Physical activity    Days per week: Not on file    Minutes per session: Not on file  . Stress: Not on file  Relationships  . Social Musicianconnections    Talks on phone: Not on file    Gets together: Not on file    Attends  religious service: Not on file    Active member of club or organization: Not on file    Attends meetings of clubs or organizations: Not on file    Relationship status: Not on file  Other Topics Concern  . Not on file  Social History Narrative  . Not on file     Family History:  The patient's family history includes Diabetes in his paternal grandmother; Heart Problems in his mother; Heart disease in his maternal grandfather and mother; Hodgkin's lymphoma (age of onset: 1718) in his brother.   ROS:   Please see the history of present illness.    ROS All other systems reviewed and are negative.   PHYSICAL EXAM:   VS:  BP 101/69   Pulse 68   Temp 97.9 F (36.6 C) (Temporal)   Ht 6\' 5"  (1.956 m)   Wt (!) 454 lb 6.4 oz (206.1 kg)   SpO2 96%   BMI 53.88 kg/m     General: Alert, oriented x3, no distress, morbidly obese Head: no evidence of trauma, PERRL, EOMI, no exophtalmos or lid lag, no myxedema, no xanthelasma; normal ears, nose and oropharynx Neck: normal jugular venous pulsations and no hepatojugular reflux; brisk carotid pulses without delay and no carotid bruits Chest: clear to auscultation, no signs of consolidation by percussion or palpation, normal fremitus, symmetrical and full respiratory excursions Cardiovascular: normal position and quality of the apical impulse, regular rhythm, normal first and second heart sounds, no murmurs, rubs or gallops Abdomen: no tenderness or distention, no masses by palpation, no abnormal pulsatility or arterial bruits, normal bowel sounds, no hepatosplenomegaly Extremities: no clubbing, cyanosis; 1+ symmetrical pedal edema; 2+ radial, ulnar and brachial pulses bilaterally; 2+ right femoral, posterior tibial and dorsalis pedis pulses; 2+ left femoral, posterior tibial and dorsalis pedis pulses; no subclavian or femoral bruits Neurological: grossly nonfocal Psych: Normal mood and affect    Wt Readings from Last 3 Encounters:  11/21/18 (!) 454  lb 6.4 oz (206.1 kg)  09/25/18 (!) 450 lb (204.1 kg)  08/03/18 (!) 454 lb 9.6 oz (206.2 kg)      Studies/Labs Reviewed:  ECHO 01/27/2018: - Left ventricle: The cavity size was mildly dilated. There was   mild concentric hypertrophy. Systolic function was normal. The   estimated ejection fraction was 40%. Wall motion was normal;   there were no regional wall motion abnormalities. Doppler   parameters are consistent with abnormal left ventricular   relaxation (grade 1 diastolic dysfunction). There was no evidence   of elevated ventricular filling pressure by Doppler parameters. - Aortic valve: There was no regurgitation. - Aortic root: The aortic root was normal in size. - Mitral valve: There was mild regurgitation. Valve area by   pressure half-time: 2.22 cm^2. - Left atrium: The atrium was mildly dilated. - Right ventricle: Pacer wire or catheter noted in right ventricle.   Systolic function was normal. - Right atrium: Pacer wire or catheter noted in right atrium. - Tricuspid valve: There was mild regurgitation. - Pulmonic valve: There was mild regurgitation. - Inferior vena cava: The vessel was normal in size. - Pericardium, extracardiac: There was no pericardial effusion.  Impressions:  - There has been no significant change since the prior study on   07/31/2015, LVEF remains moderately decreased  estimated at 40%.   Aortic root is dilated at the sinus level with maximum diameter   45 mm. Ascending aortic size is stable at 40 mm.  EKG:  EKG is not ordered today.  Intracardiac electrogram shows atrial sensed, ventricular sensed rhythm  Recent Labs: 01/11/2018: Magnesium 2.0 08/03/2018: ALT 26; BUN 10; Creatinine, Ser 0.83; Hemoglobin 12.5; Platelets 252; Potassium 4.4; Sodium 140   Lipid Panel    Component Value Date/Time   CHOL 125 08/03/2018 1528   TRIG 93 08/03/2018 1528   HDL 39 (L) 08/03/2018 1528   CHOLHDL 3.2 08/03/2018 1528   CHOLHDL 5.3 03/20/2009 0410    VLDL 22 03/20/2009 0410   LDLCALC 67 08/03/2018 1528   LDLDIRECT 52 03/26/2015 0824     ASSESSMENT:    1. Class 3 severe obesity due to excess calories with serious comorbidity and body mass index (BMI) of 50.0 to 59.9 in adult [facility](HCC)      PLAN:  In order of problems listed above:  1. CHF: NYHA functional class III.  Mild ankle swelling, but thoracic impedance is stable without elevation in intrathoracic fluid.  He is being very careful with sodium restriction and monitors daily weights. On appropriate high doses of carvedilol and Entresto.  I do not think his blood pressure will allow additional heart failure medications.  He has orthostatic hypotension right now. 2. CMP: Nonischemic.  Most recent LV ejection fraction 40%.   3. AFib: No recent recurrence. CHADSVasc 2 (CHF, HTN).  Continues to have episodes of atrial tachycardia.  On appropriate anticoagulation. 4. Warfarin: Followed by PCP.  No bleeding complications. 5. PAT/atypical flutter: Continues to have episodes of atypical atrial flutter and atrial tachycardia that usually occur with some degree of AV block.  Has had inappropriate ICD shocks in the past, but the recent events have not come close to the treatment zone.  There was a planned admission for dofetilide, but the coronavirus stopped that and his arrhythmia seems to have settled down to some degree.  We will cancel that prescription for now.   6. AICD advisory: Normal device function.  His St Jude device is under advisory for potential rapid battery depletion, but it in his particular case this is unlikely to be a serious issue.  He is not pacemaker dependent and has not required appropriate defibrillator therapy for  ventricular tachyarrhythmia. Will continue with frequent battery checks, but no plan for early generator change out at this time. 7. Obesity: Considering bariatric surgery.  Will refer to Dr. Francena HanlyBeasley's clinic. 8. HLP: Other than low HDL, favorable lipid profile. 9.  HTN: Blood pressure is low on heart failure medications.  Medication Adjustments/Labs and Tests Ordered: Current medicines are reviewed at length with the patient today.  Concerns regarding medicines are outlined above.  Medication changes, Labs and Tests ordered today are listed in the Patient Instructions below. Patient Instructions  Medication Instructions:  Your physician recommends that you continue on your current medications as directed. Please refer to the Current Medication list given to you today.  If you need a refill on your cardiac medications before your next appointment, please call your pharmacy.   Lab work: None ordered If you have labs (blood work) drawn today and your tests are completely normal, you will receive your results only by: MyChart Message (if you have MyChart) OR A paper copy in the mail If you have any lab test that is abnormal or we need to change your treatment, we will call you to review the results.  Testing/Procedures: None ordered  Follow-Up: At [facility], you and your health needs are our priority.  As part of our continuing mission to provide you with exceptional heart care, we have created designated Provider Care Teams.  These Care Teams include your primary Cardiologist (physician) and Advanced Practice Providers (APPs -  Physician Assistants and Nurse Practitioners) who all work together to provide you with the care you need, when you need it. You will need a follow up appointment in 3 months.  Please call our office 2 months in advance to schedule this appointment.  You may see Thurmon FairMihai Roderick Sweezy, MD or one of the following Advanced Practice Providers on your designated Care Team: Azalee CourseHao Meng, [place]PA-C Micah FlesherAngela Duke, [place]PA-C  Any Other Special Instructions Will Be Listed Below (If Applicable). A referral has been placed to Bariatric surgery. They will call you to set up an appointment.            Signed, Thurmon FairMihai Elayne Gruver, MD  11/23/2018 7:06 PM     [facility] [address], [place], [place]  [address] Phone: [phone]; Fax: [phone]) [phone]

## 2018-11-21 NOTE — Patient Instructions (Signed)
Medication Instructions:  Your physician recommends that you continue on your current medications as directed. Please refer to the Current Medication list given to you today.  If you need a refill on your cardiac medications before your next appointment, please call your pharmacy.   Lab work: None ordered If you have labs (blood work) drawn today and your tests are completely normal, you will receive your results only by: [place] (if you have MyChart) OR A paper copy in the mail If you have any lab test that is abnormal or we need to change your treatment, we will call you to review the results.  Testing/Procedures: None ordered  Follow-Up: At [facility], you and your health needs are our priority.  As part of our continuing mission to provide you with exceptional heart care, we have created designated Provider Care Teams.  These Care Teams include your primary Cardiologist (physician) and Advanced Practice Providers (APPs -  Physician Assistants and Nurse Practitioners) who all work together to provide you with the care you need, when you need it. You will need a follow up appointment in 3 months.  Please call our office 2 months in advance to schedule this appointment.  You may see Sanda Klein, MD or one of the following Advanced Practice Providers on your designated Care Team: Almyra Deforest, [place] Fabian Sharp, [place]  Any Other Special Instructions Will Be Listed Below (If Applicable). A referral has been placed to Bariatric surgery. They will call you to set up an appointment.

## 2018-11-22 ENCOUNTER — Telehealth: Payer: Self-pay | Admitting: *Deleted

## 2018-11-22 NOTE — Telephone Encounter (Signed)
Left message for patient to call and schedule 3 mos follow up appointment with Dr. Sallyanne Kuster (patient has device--appointment will need to be on a Monday)

## 2018-12-09 DIAGNOSIS — G4733 Obstructive sleep apnea (adult) (pediatric): Secondary | ICD-10-CM | POA: Diagnosis not present

## 2018-12-28 ENCOUNTER — Encounter: Payer: Medicaid Other | Admitting: *Deleted

## 2019-01-09 DIAGNOSIS — G4733 Obstructive sleep apnea (adult) (pediatric): Secondary | ICD-10-CM | POA: Diagnosis not present

## 2019-01-19 ENCOUNTER — Ambulatory Visit (INDEPENDENT_AMBULATORY_CARE_PROVIDER_SITE_OTHER): Payer: Medicaid Other | Admitting: *Deleted

## 2019-01-19 DIAGNOSIS — I5022 Chronic systolic (congestive) heart failure: Secondary | ICD-10-CM

## 2019-01-19 DIAGNOSIS — I471 Supraventricular tachycardia: Secondary | ICD-10-CM

## 2019-01-21 LAB — CUP PACEART REMOTE DEVICE CHECK
Battery Remaining Longevity: 16 mo
Battery Remaining Percentage: 14 %
Battery Voltage: 2.72 V
Brady Statistic AP VP Percent: 1 %
Brady Statistic AP VS Percent: 1 %
Brady Statistic AS VP Percent: 1 %
Brady Statistic AS VS Percent: 99 %
Brady Statistic RA Percent Paced: 1 %
Brady Statistic RV Percent Paced: 1 %
Date Time Interrogation Session: 20200912204049
HighPow Impedance: 41 Ohm
Implantable Lead Implant Date: 20111010
Implantable Lead Implant Date: 20111010
Implantable Lead Location: 753859
Implantable Lead Location: 753860
Implantable Pulse Generator Implant Date: 20111010
Lead Channel Impedance Value: 440 Ohm
Lead Channel Impedance Value: 440 Ohm
Lead Channel Pacing Threshold Amplitude: 0.75 V
Lead Channel Pacing Threshold Amplitude: 1.5 V
Lead Channel Pacing Threshold Pulse Width: 0.5 ms
Lead Channel Pacing Threshold Pulse Width: 0.5 ms
Lead Channel Sensing Intrinsic Amplitude: 12 mV
Lead Channel Sensing Intrinsic Amplitude: 2.7 mV
Lead Channel Setting Pacing Amplitude: 2.5 V
Lead Channel Setting Pacing Amplitude: 2.5 V
Lead Channel Setting Pacing Pulse Width: 0.5 ms
Lead Channel Setting Sensing Sensitivity: 0.5 mV
Pulse Gen Serial Number: 621444

## 2019-01-26 NOTE — Progress Notes (Signed)
Remote ICD transmission.   

## 2019-02-08 DIAGNOSIS — G4733 Obstructive sleep apnea (adult) (pediatric): Secondary | ICD-10-CM | POA: Diagnosis not present

## 2019-02-18 DIAGNOSIS — R05 Cough: Secondary | ICD-10-CM | POA: Diagnosis not present

## 2019-02-26 ENCOUNTER — Encounter: Payer: Medicaid Other | Admitting: Cardiovascular Disease

## 2019-03-11 DIAGNOSIS — G4733 Obstructive sleep apnea (adult) (pediatric): Secondary | ICD-10-CM | POA: Diagnosis not present

## 2019-03-13 ENCOUNTER — Encounter: Payer: Self-pay | Admitting: *Deleted

## 2019-03-16 DIAGNOSIS — J329 Chronic sinusitis, unspecified: Secondary | ICD-10-CM | POA: Diagnosis not present

## 2019-03-16 DIAGNOSIS — R05 Cough: Secondary | ICD-10-CM | POA: Diagnosis not present

## 2019-03-28 ENCOUNTER — Encounter (INDEPENDENT_AMBULATORY_CARE_PROVIDER_SITE_OTHER): Payer: Self-pay | Admitting: Family Medicine

## 2019-03-28 ENCOUNTER — Other Ambulatory Visit: Payer: Self-pay

## 2019-03-28 ENCOUNTER — Encounter: Payer: Self-pay | Admitting: Family Medicine

## 2019-03-28 ENCOUNTER — Ambulatory Visit (INDEPENDENT_AMBULATORY_CARE_PROVIDER_SITE_OTHER): Payer: Medicaid Other | Admitting: Family Medicine

## 2019-03-28 VITALS — BP 93/59 | HR 57 | Temp 98.0°F | Ht 75.0 in | Wt >= 6400 oz

## 2019-03-28 DIAGNOSIS — R7303 Prediabetes: Secondary | ICD-10-CM | POA: Diagnosis not present

## 2019-03-28 DIAGNOSIS — F3289 Other specified depressive episodes: Secondary | ICD-10-CM | POA: Diagnosis not present

## 2019-03-28 DIAGNOSIS — Z6841 Body Mass Index (BMI) 40.0 and over, adult: Secondary | ICD-10-CM | POA: Diagnosis not present

## 2019-03-28 DIAGNOSIS — Z0289 Encounter for other administrative examinations: Secondary | ICD-10-CM

## 2019-03-28 DIAGNOSIS — R5383 Other fatigue: Secondary | ICD-10-CM | POA: Diagnosis not present

## 2019-03-28 DIAGNOSIS — I5022 Chronic systolic (congestive) heart failure: Secondary | ICD-10-CM

## 2019-03-28 DIAGNOSIS — R198 Other specified symptoms and signs involving the digestive system and abdomen: Secondary | ICD-10-CM | POA: Diagnosis not present

## 2019-03-28 DIAGNOSIS — R0602 Shortness of breath: Secondary | ICD-10-CM | POA: Diagnosis not present

## 2019-03-29 DIAGNOSIS — R198 Other specified symptoms and signs involving the digestive system and abdomen: Secondary | ICD-10-CM | POA: Insufficient documentation

## 2019-03-29 DIAGNOSIS — R7303 Prediabetes: Secondary | ICD-10-CM | POA: Insufficient documentation

## 2019-03-29 LAB — COMPREHENSIVE METABOLIC PANEL
ALT: 39 IU/L (ref 0–44)
AST: 18 IU/L (ref 0–40)
Albumin/Globulin Ratio: 1.2 (ref 1.2–2.2)
Albumin: 4 g/dL (ref 4.0–5.0)
Alkaline Phosphatase: 88 IU/L (ref 39–117)
BUN/Creatinine Ratio: 11 (ref 9–20)
BUN: 9 mg/dL (ref 6–20)
Bilirubin Total: 0.7 mg/dL (ref 0.0–1.2)
CO2: 24 mmol/L (ref 20–29)
Calcium: 8.4 mg/dL — ABNORMAL LOW (ref 8.7–10.2)
Chloride: 100 mmol/L (ref 96–106)
Creatinine, Ser: 0.79 mg/dL (ref 0.76–1.27)
GFR calc Af Amer: 133 mL/min/{1.73_m2} (ref >59)
GFR calc non Af Amer: 115 mL/min/{1.73_m2} (ref >59)
Globulin, Total: 3.4 g/dL (ref 1.5–4.5)
Glucose: 174 mg/dL — ABNORMAL HIGH (ref 65–99)
Potassium: 4.4 mmol/L (ref 3.5–5.2)
Sodium: 138 mmol/L (ref 134–144)
Total Protein: 7.4 g/dL (ref 6.0–8.5)

## 2019-03-29 LAB — CBC WITH DIFFERENTIAL/PLATELET
Basophils Absolute: 0 10*3/uL (ref 0.0–0.2)
Basos: 1 %
EOS (ABSOLUTE): 0.2 10*3/uL (ref 0.0–0.4)
Eos: 4 %
Hematocrit: 37.7 % (ref 37.5–51.0)
Hemoglobin: 12.2 g/dL — ABNORMAL LOW (ref 13.0–17.7)
Immature Grans (Abs): 0 10*3/uL (ref 0.0–0.1)
Immature Granulocytes: 0 %
Lymphocytes Absolute: 1.2 10*3/uL (ref 0.7–3.1)
Lymphs: 33 %
MCH: 27.1 pg (ref 26.6–33.0)
MCHC: 32.4 g/dL (ref 31.5–35.7)
MCV: 84 fL (ref 79–97)
Monocytes Absolute: 0.4 10*3/uL (ref 0.1–0.9)
Monocytes: 10 %
Neutrophils Absolute: 2 10*3/uL (ref 1.4–7.0)
Neutrophils: 52 %
Platelets: 262 10*3/uL (ref 150–450)
RBC: 4.51 x10E6/uL (ref 4.14–5.80)
RDW: 14.5 % (ref 11.6–15.4)
WBC: 3.8 10*3/uL (ref 3.4–10.8)

## 2019-03-29 LAB — LIPID PANEL WITH LDL/HDL RATIO
Cholesterol, Total: 122 mg/dL (ref 100–199)
HDL: 36 mg/dL — ABNORMAL LOW (ref >39)
LDL Chol Calc (NIH): 70 mg/dL (ref 0–99)
LDL/HDL Ratio: 1.9 ratio (ref 0.0–3.6)
Triglycerides: 81 mg/dL (ref 0–149)
VLDL Cholesterol Cal: 16 mg/dL (ref 5–40)

## 2019-03-29 LAB — AMYLASE: Amylase: 42 U/L (ref 31–110)

## 2019-03-29 LAB — HEMOGLOBIN A1C
Est. average glucose Bld gHb Est-mCnc: 177 mg/dL
Hgb A1c MFr Bld: 7.8 % — ABNORMAL HIGH (ref 4.8–5.6)

## 2019-03-29 LAB — TSH: TSH: 2.53 u[IU]/mL (ref 0.450–4.500)

## 2019-03-29 LAB — VITAMIN D 25 HYDROXY (VIT D DEFICIENCY, FRACTURES): Vit D, 25-Hydroxy: 6.9 ng/mL — ABNORMAL LOW (ref 30.0–100.0)

## 2019-03-29 LAB — LIPASE: Lipase: 16 U/L (ref 13–78)

## 2019-03-29 LAB — T3: T3, Total: 128 ng/dL (ref 71–180)

## 2019-03-29 LAB — INSULIN, RANDOM: INSULIN: 99.3 u[IU]/mL — ABNORMAL HIGH (ref 2.6–24.9)

## 2019-03-29 LAB — T4, FREE: Free T4: 1 ng/dL (ref 0.82–1.77)

## 2019-03-29 LAB — VITAMIN B12: Vitamin B-12: 214 pg/mL — ABNORMAL LOW (ref 232–1245)

## 2019-04-02 ENCOUNTER — Encounter (INDEPENDENT_AMBULATORY_CARE_PROVIDER_SITE_OTHER): Payer: Self-pay | Admitting: Family Medicine

## 2019-04-02 NOTE — Progress Notes (Signed)
Thank you for the followup.

## 2019-04-02 NOTE — Progress Notes (Signed)
Office: [phone]  /  Fax: [phone]   Dear Dr. Royann Shivers,   Thank you for referring Danny Hinton to our clinic. The following note includes my evaluation and treatment recommendations.  HPI:   Chief Complaint: OBESITY    Danny E Gaster has been referred by Thurmon Fair, MD for consultation regarding his obesity and obesity related comorbidities.    Danny E Neaves (MR# [id number]) is a 38 y.o. male who presents on 03/28/2019 for obesity evaluation and treatment. Current BMI is Body mass index is 56.5 kg/m.Marland Kitchen Danny has been struggling with his weight for many years and has been unsuccessful in either losing weight, maintaining weight loss, or reaching his healthy weight goal.     Danny states he is currently in the action stage of change and ready to dedicate time achieving and maintaining a healthier weight. Danny is interested in becoming our patient and working on intensive lifestyle modifications including (but not limited to) diet, exercise and weight loss.    Danny states his family eats meals together he thinks his family will eat healthier with  him his desired weight loss is 192 lbs he has been heavy most of  his life he started gaining weight when he got sick his heaviest weight ever was 452 lbs he is a picky eater and doesn't like to eat healthier foods  he skips meals frequently he is frequently drinking liquids with calories he frequently eats larger portions than normal  he struggles with emotional eating    Fatigue Danny feels his energy is lower than it should be. This has worsened with weight gain and has not worsened recently. Danny admits to daytime somnolence and  admits to waking up still tired. Patient is at risk for obstructive sleep apnea. Patent has a history of symptoms of daytime fatigue. Patient generally gets 6 hours of sleep per night, and states they generally have nightime awakenings. Snoring is present. Apneic episodes are present. Epworth Sleepiness Score is  9.  Dyspnea on exertion Danny notes increasing shortness of breath with exercising and seems to be worsening over time with weight gain. He notes getting out of breath sooner with activity than he used to. This has not gotten worse recently. Danny denies orthopnea.  Congestive Heart Failure with Edema Danny has a diagnosis of CHF with a history of paroxysmal a-Fib/a flutter, with pacer defibrillator.   Alternating Constipation and Diarrhea Danny notes alternating between having constipation and diarrhea.  Pre-Diabetes Danny has a diagnosis of pre-diabetes based on his elevated Hgb A1c and was informed this puts him at greater risk of developing diabetes. He likes to drink juice and ginger ale. He is not taking metformin currently and would like to work on diet and exercise to decrease risk of diabetes.   Obstructive Sleep Apnea Danny has a diagnosis of obstructive sleep apnea. His epworth score is of 9. He is treated with BiPAP and is compliant.    Depression with Emotional Eating Behaviors Danny has an emotional eating pattern. He is taking Zoloft and feels that it helps. Danny struggles with emotional eating and is using food for comfort to the extent that it is negatively impacting his health. He often snacks when he is not hungry. Danny sometimes feels he is out of control and then feels guilty that he made poor food choices. He has been working on behavior modification techniques to help reduce his emotional eating and has been somewhat successful. He shows no sign of suicidal or homicidal ideations.  Depression Screen Shayn's Food and Mood (modified PHQ-9) score was  Depression screen PHQ 2/9 03/28/2019  Decreased Interest 3  Down, Depressed, Hopeless 1  PHQ - 2 Score 4  Altered sleeping 3  Tired, decreased energy 3  Change in appetite 1  Feeling bad or failure about yourself  1  Trouble concentrating 0  Moving slowly or fidgety/restless 3  Suicidal thoughts 0  PHQ-9 Score 15    Difficult doing work/chores Somewhat difficult  Some recent data might be hidden    ASSESSMENT AND PLAN:  Other fatigue - Plan: EKG 12-Lead, CBC w/Diff/Platelet, Vitamin D (25 hydroxy), T3, T4, free, TSH, B12  Shortness of breath on exertion - Plan: Lipid Panel With LDL/HDL Ratio  Chronic systolic congestive heart failure (HCC) - Plan: Amylase, Lipase  Alternating constipation and diarrhea - Plan: Amylase, Lipase  Prediabetes - Plan: Amylase, Lipase, Comprehensive Metabolic Panel (CMET), HgB A1c, Insulin, random  Other depression, emotional eating  Class 3 severe obesity due to excess calories with serious comorbidity and body mass index (BMI) of 50.0 to 59.9 in adult [facility])  PLAN:  Fatigue Danny was informed that his fatigue may be related to obesity, depression or many other causes. Labs will be ordered, and in the meanwhile Danny has agreed to work on diet, exercise and weight loss to help with fatigue. Proper sleep hygiene was discussed including the need for 7-8 hours of quality sleep each night. A sleep study was not ordered based on symptoms and Epworth score.  Dyspnea on exertion Sahith's shortness of breath appears to be obesity related and exercise induced. He has agreed to work on weight loss and gradually increase exercise to treat his exercise induced shortness of breath. If Danny follows our instructions and loses weight without improvement of his shortness of breath, we will plan to refer to pulmonology. We will monitor this condition regularly. Danny agrees to this plan.  Congestive Heart Failure with Edema We will continue to monitor closely with weight loss.  Alternating Constipation and Diarrhea Danny wants to hold off on the GI referral. We discussed bowl regimen and IBS. We will continue to monitor closely.  Pre-Diabetes Danny will continue to work on weight loss, exercise, and decreasing simple carbohydrates in his diet to help decrease the risk of diabetes. We  dicussed metformin including benefits and risks. He was informed that eating too many simple carbohydrates or too many calories at one sitting increases the likelihood of GI side effects. We will check labs today. Danny agrees to follow up with [place] as directed to monitor his progress.  Obstructive Sleep Apnea Danny will continue to be compliant with using his BiPAP.  Depression with Emotional Eating Behaviors We discussed behavior modification techniques today to help Danny deal with his emotional eating and depression. Danny agrees to continue his medications, and he agrees to follow up with our clinic in 2 weeks.  Depression Screen Danny had a strongly positive depression screening. Depression is commonly associated with obesity and often results in emotional eating behaviors. We will monitor this closely and work on CBT to help improve the non-hunger eating patterns. Referral to Psychology may be required if no improvement is seen as he continues in our clinic.  Obesity Danny is currently in the action stage of change and his goal is to continue with weight loss efforts. I recommend Danny begin the structured treatment plan as follows:  He has agreed to follow the Category 4 plan Danny has been instructed to eventually work  up to a goal of 150 minutes of combined cardio and strengthening exercise per week or as tolerated for weight loss and overall health benefits. We discussed the following Behavioral Modification Strategies today: increasing lean protein intake, decreasing simple carbohydrates, increasing vegetables, increase H20 intake, work on meal planning and easy cooking plans, decrease liquid calories, and planning for success   He was informed of the importance of frequent follow up visits to maximize his success with intensive lifestyle modifications for his multiple health conditions. He was informed we would discuss his lab results at his next visit unless there is a critical issue that  needs to be addressed sooner. [name] agreed to keep his next visit at the agreed upon time to discuss these results.  ALLERGIES: No Known Allergies  MEDICATIONS: Current Outpatient Medications on File Prior to Visit  Medication Sig Dispense Refill  . ALPRAZolam (XANAX) 1 MG tablet Take 1 tablet (1 mg total) by mouth 3 (three) times daily as needed for anxiety. 30 tablet 0  . atorvastatin (LIPITOR) 40 MG tablet Take 1 tablet (40 mg total) by mouth daily with breakfast. (Needs visit with PCP) 90 tablet 3  . buPROPion (WELLBUTRIN XL) 150 MG 24 hr tablet Take 1 tablet (150 mg total) by mouth daily. 90 tablet 3  . carvedilol (COREG) 25 MG tablet Take 1.5 tablets (37.5 mg total) by mouth 2 (two) times daily with a meal. 270 tablet 3  . digoxin (LANOXIN) 0.125 MG tablet Take 1 tablet (125 mcg total) by mouth every other day. 45 tablet 2  . fluticasone (FLONASE) 50 MCG/ACT nasal spray Place 1 spray into both nostrils as needed. 16 g 11  . furosemide (LASIX) 40 MG tablet Take 1 tablet (40 mg total) by mouth every other day. 12 tablet 3  . loratadine (CLARITIN) 10 MG tablet Take 1 tablet (10 mg total) by mouth daily. 30 tablet 11  . metoprolol tartrate (LOPRESSOR) 25 MG tablet Take 1 tablet as needed for palpitations 90 tablet 3  . sacubitril-valsartan (ENTRESTO) 49-51 MG Take 1 tablet by mouth 2 (two) times daily. 180 tablet 3  . sertraline (ZOLOFT) 100 MG tablet Take 1 tablet (100 mg total) by mouth at bedtime. 30 tablet 11  . triamcinolone cream (KENALOG) 0.1 % Apply topically 2 (two) times daily as needed. 453.6 g 2  . warfarin (COUMADIN) 5 MG tablet TAKE 1 TABLET BY MOUTH ONCE DAILY EXCEPT  TAKE  1  &  1/2  TABLET  EACH  FRIDAY  OR  AS  DIRECTED  BY  ANTICOAGULATION  CLINIC 100 tablet 3   No current facility-administered medications on file prior to visit.     PAST MEDICAL HISTORY: Past Medical History:  Diagnosis Date  . Allergy   . Anxiety   . Atypical atrial flutter (HCC) 08/19/14   pace  terminated in device clinic.  AFL cycle length was 390 msec  . Automatic implantable cardioverter-defibrillator in situ    a. St Jude in 2011.  . Back pain   . Chronic anticoagulation   . Chronic systolic congestive heart failure (HCC)    a. suspected NICM - dx 2010. EF 15% by TEE, 10-20% by echo at that time. b. s/p St. Jude AICD 2011. c. Echo 01/2010: mod dilated LV, EF 30%, mod aortic root dilitation, no significant valvular disease.  . Depression   . Eczema   . Eczema   . Enlarged aorta (HCC)   . Hyperlipidemia   . Hypertension   .  Mobitz type 2 second degree atrioventricular block    a. During sleeping hours in 2010 suspected due to ? OSA.  . Morbid obesity ([place])   . Nonischemic cardiomyopathy ([place])   . OSA treated with BiPAP 09/04/2018   Severe obstructive sleep apnea with an AHI of 67.2/h and nocturnal hypoxemia with oxygen saturations as low as 82%. Now on BIPAP at  18/14 cm H2O.  Marland Kitchen PAF (paroxysmal atrial fibrillation) ([place])   . Palpitations   . Paroxysmal atrial flutter (HCC)    s/p ablation  . Pre-diabetes   . Shortness of breath   . Sleep apnea     PAST SURGICAL HISTORY: Past Surgical History:  Procedure Laterality Date  . ATRIAL FLUTTER ABLATION N/A 01/18/2014   Procedure: ATRIAL FLUTTER ABLATION;  Surgeon: Deboraha Sprang, MD;  Location: [facility] CATH LAB;  Service: Cardiovascular;  Laterality: N/A;  . CARDIAC DEFIBRILLATOR PLACEMENT  02/17/10   St. Jude Medical 45DR, model number [id number], serial number [id number]  . CARDIOVERSION N/A 12/29/2013   Procedure: CARDIOVERSION;  Surgeon: Deboraha Sprang, MD;  Location: [place];  Service: Cardiovascular;  Laterality: N/A;  . TOOTH EXTRACTION N/A 10/23/2012   Procedure: EXTRACTION TEETH 1, 16, 17, 30, 31;  Surgeon: Gae Bon, DDS;  Location: [place];  Service: Oral Surgery;  Laterality: N/A;    SOCIAL HISTORY: Social History   Tobacco Use  . Smoking status: Never Smoker  . Smokeless tobacco: Never Used  Substance Use Topics    . Alcohol use: No  . Drug use: No    FAMILY HISTORY: Family History  Problem Relation Age of Onset  . Hodgkin's lymphoma Brother 39  . Heart disease Mother   . Heart Problems Mother        atrial flutter  . Depression Mother   . Anxiety disorder Mother   . Heart disease Maternal Grandfather   . Diabetes Paternal Grandmother     ROS: Review of Systems  Constitutional: Positive for malaise/fatigue. Negative for weight loss.       + Trouble sleeping  HENT: Positive for sinus pain and tinnitus.        + Nasal stuffiness  Eyes:       + Wear glasses or contacts  Respiratory: Positive for shortness of breath (with exertion) and wheezing.   Cardiovascular: Positive for palpitations. Negative for orthopnea.       + Calf/leg pain with walking + Leg cramping  Gastrointestinal: Positive for constipation and diarrhea.  Musculoskeletal: Positive for back pain.  Skin: Positive for rash.  Neurological: Positive for dizziness and weakness.  Endo/Heme/Allergies: Bruises/bleeds easily.       + Heat/cold intolerance  Psychiatric/Behavioral: Positive for depression. Negative for suicidal ideas.       + Stress    PHYSICAL EXAM: Blood pressure (!) 93/59, pulse (!) 57, temperature 98 F (36.7 C), temperature source Oral, height 6\' 3"  (1.905 m), weight (!) 452 lb (205 kg), SpO2 96 %. Body mass index is 56.5 kg/m. Physical Exam Vitals signs reviewed.  Constitutional:      Appearance: Normal appearance. He is obese.  HENT:     Head: Normocephalic and atraumatic.     Nose: Nose normal.  Eyes:     General: No scleral icterus.    Extraocular Movements: Extraocular movements intact.  Neck:     Musculoskeletal: Normal range of motion and neck supple.     Comments: No thyromegaly present Cardiovascular:     Rate and Rhythm: Normal rate and  regular rhythm.     Pulses: Normal pulses.     Heart sounds: Normal heart sounds.  Pulmonary:     Effort: Pulmonary effort is normal. No respiratory  distress.     Breath sounds: Normal breath sounds.  Abdominal:     Palpations: Abdomen is soft.     Tenderness: There is no abdominal tenderness.     Comments: + Obesity  Musculoskeletal: Normal range of motion.     Right lower leg: No edema.     Left lower leg: No edema.  Skin:    General: Skin is warm and dry.  Neurological:     Mental Status: He is alert and oriented to person, place, and time.     Coordination: Coordination normal.  Psychiatric:        Mood and Affect: Mood normal.        Behavior: Behavior normal.     RECENT LABS AND TESTS: BMET    Component Value Date/Time   NA 138 03/28/2019 0820   K 4.4 03/28/2019 0820   CL 100 03/28/2019 0820   CO2 24 03/28/2019 0820   GLUCOSE 174 (H) 03/28/2019 0820   GLUCOSE 88 01/11/2014 1302   BUN 9 03/28/2019 0820   CREATININE 0.79 03/28/2019 0820   CREATININE 0.78 07/27/2012 1330   CALCIUM 8.4 (L) 03/28/2019 0820   GFRNONAA 115 03/28/2019 0820   GFRAA 133 03/28/2019 0820   Lab Results  Component Value Date   HGBA1C 7.8 (H) 03/28/2019   Lab Results  Component Value Date   INSULIN 99.3 (H) 03/28/2019   CBC    Component Value Date/Time   WBC 3.8 03/28/2019 0820   WBC 4.0 01/11/2014 1302   RBC 4.51 03/28/2019 0820   RBC 4.76 01/11/2014 1302   HGB 12.2 (L) 03/28/2019 0820   HCT 37.7 03/28/2019 0820   PLT 262 03/28/2019 0820   MCV 84 03/28/2019 0820   MCH 27.1 03/28/2019 0820   MCH 27.1 12/29/2013 0244   MCHC 32.4 03/28/2019 0820   MCHC 32.8 01/11/2014 1302   RDW 14.5 03/28/2019 0820   LYMPHSABS 1.2 03/28/2019 0820   MONOABS 0.4 01/11/2014 1302   EOSABS 0.2 03/28/2019 0820   BASOSABS 0.0 03/28/2019 0820   Iron/TIBC/Ferritin/ %Sat    Component Value Date/Time   IRON 44 03/22/2009 0440   TIBC 278 03/22/2009 0440   FERRITIN 110 03/22/2009 0440   IRONPCTSAT 16 (L) 03/22/2009 0440   Lipid Panel     Component Value Date/Time   CHOL 122 03/28/2019 0820   TRIG 81 03/28/2019 0820   HDL 36 (L) 03/28/2019  0820   CHOLHDL 3.2 08/03/2018 1528   CHOLHDL 5.3 03/20/2009 0410   VLDL 22 03/20/2009 0410   LDLCALC 70 03/28/2019 0820   LDLDIRECT 52 03/26/2015 0824   Hepatic Function Panel     Component Value Date/Time   PROT 7.4 03/28/2019 0820   ALBUMIN 4.0 03/28/2019 0820   AST 18 03/28/2019 0820   ALT 39 03/28/2019 0820   ALKPHOS 88 03/28/2019 0820   BILITOT 0.7 03/28/2019 0820   BILIDIR 0.16 04/23/2015 0833   IBILI 1.2 (H) 03/24/2009 0320      Component Value Date/Time   TSH 2.530 03/28/2019 0820   TSH 1.670 01/22/2015 1114   TSH 2.460 12/27/2013 0603    ECG  shows NSR with a rate of 61 BPM INDIRECT CALORIMETER done today shows a VO2 of 434 and a REE of 3021.  His calculated basal metabolic rate is 9024  thus his basal metabolic rate is worse than expected.       OBESITY BEHAVIORAL INTERVENTION VISIT  Today's visit was # 1   Starting weight: 452 lbs Starting date: 03/28/2019 Today's weight : 452 lbs Today's date: 03/28/2019 Total lbs lost to date: 0 At least 15 minutes were spent on discussing the following behavioral intervention visit.   ASK: We discussed the diagnosis of obesity with [name] E Card today and [name] agreed to give us permission to discuss obesity behavioral modification therapy today.  ASSESS: [name] has the diagnosis of obesity and his BMI today is 4456.5 [name] is in the action stage of change   ADVISE: [name] was educated on the multiple health risks of obesity as well as the benefit of weight loss to improve his health. He was advised of the need for long term treatment and the importance of lifestyle modifications to improve his current health and to decrease his risk of future health problems.  AGREE: Multiple dietary modification options and treatment options were discussed and  [name] agreed to follow the recommendations documented in the above note.  ARRANGE: [name] was educated on the importance of frequent visits to treat obesity as outlined per CMS and  USPSTF guidelines and agreed to schedule his next follow up appointment today.  Trude McburneyI, Sharon Martin, am acting as transcriptionist for Helane RimaErica Teran Daughenbaugh, DO  I have reviewed the above documentation for accuracy and completeness, and I agree with the above. Helane Rima- Moishe Schellenberg, DO

## 2019-04-09 ENCOUNTER — Other Ambulatory Visit: Payer: Self-pay

## 2019-04-09 ENCOUNTER — Ambulatory Visit: Payer: Medicaid Other | Admitting: Cardiovascular Disease

## 2019-04-09 ENCOUNTER — Encounter: Payer: Self-pay | Admitting: Cardiovascular Disease

## 2019-04-09 VITALS — BP 123/70 | HR 58 | Temp 97.2°F | Ht 77.0 in | Wt >= 6400 oz

## 2019-04-09 DIAGNOSIS — I471 Supraventricular tachycardia: Secondary | ICD-10-CM | POA: Diagnosis not present

## 2019-04-09 DIAGNOSIS — E669 Obesity, unspecified: Secondary | ICD-10-CM | POA: Diagnosis not present

## 2019-04-09 DIAGNOSIS — I480 Paroxysmal atrial fibrillation: Secondary | ICD-10-CM

## 2019-04-09 DIAGNOSIS — I10 Essential (primary) hypertension: Secondary | ICD-10-CM | POA: Diagnosis not present

## 2019-04-09 DIAGNOSIS — E785 Hyperlipidemia, unspecified: Secondary | ICD-10-CM | POA: Diagnosis not present

## 2019-04-09 DIAGNOSIS — Z95810 Presence of automatic (implantable) cardiac defibrillator: Secondary | ICD-10-CM | POA: Diagnosis not present

## 2019-04-09 DIAGNOSIS — Z7901 Long term (current) use of anticoagulants: Secondary | ICD-10-CM | POA: Diagnosis not present

## 2019-04-09 DIAGNOSIS — I428 Other cardiomyopathies: Secondary | ICD-10-CM | POA: Diagnosis not present

## 2019-04-09 DIAGNOSIS — I5042 Chronic combined systolic (congestive) and diastolic (congestive) heart failure: Secondary | ICD-10-CM

## 2019-04-09 DIAGNOSIS — E1169 Type 2 diabetes mellitus with other specified complication: Secondary | ICD-10-CM

## 2019-04-09 MED ORDER — FUROSEMIDE 20 MG PO TABS: 20.0000 mg | ORAL_TABLET | ORAL | 5 refills | Status: DC

## 2019-04-09 NOTE — Patient Instructions (Signed)
Medication Instructions:  DECREASE the Furosemide to 20 mg every other day  *If you need a refill on your cardiac medications before your next appointment, please call your pharmacy*  Lab Work: None ordered If you have labs (blood work) drawn today and your tests are completely normal, you will receive your results only by: Marland Kitchen MyChart Message (if you have MyChart) OR . A paper copy in the mail If you have any lab test that is abnormal or we need to change your treatment, we will call you to review the results.  Testing/Procedures: None ordered  Follow-Up: At [facility], you and your health needs are our priority.  As part of our continuing mission to provide you with exceptional heart care, we have created designated Provider Care Teams.  These Care Teams include your primary Cardiologist (physician) and Advanced Practice Providers (APPs -  Physician Assistants and Nurse Practitioners) who all work together to provide you with the care you need, when you need it.  Your next appointment:   6 month(s)  The format for your next appointment:   In Person  Provider:   Sanda Klein, MD

## 2019-04-09 NOTE — Progress Notes (Signed)
Patient ID: Danny Hinton, male   DOB: 1980/08/03, 38 y.o.   MRN: [id number]    Cardiology Office Note    Date:  04/09/2019   ID:  Danny E Beil, DOB 1980/08/03, MRN [id number]  PCP:  Dettinger, Elige RadonJoshua A, MD  Cardiologist:   Thurmon FairMihai Atira Borello, MD   Chief Complaint  Patient presents with  . Irregular Heart Beat  . Congestive Heart Failure  . Pacemaker Check    ICD    History of Present Illness:  Danny E Klier is a 38 y.o. male who presents for nonischemic cardiomyopathy, chronic systolic heart failure, defibrillator checkup, history of atrial flutter, atrial fibrillation and paroxysmal atrial tachycardia.  He generally feels well but remains limited in his ability to perform physical activity.  He gets short of breath walking on anything other than flat terrain at a slow pace.  NYHA functional class III.  He has not been troubled by palpitations.  Denies edema, orthopnea, PND or chest discomfort.  He frequently has dizziness, especially early in the morning.  Nutrition continues with multiple reduction of paroxysmal atrial tachycardia/atypical atrial fibrillation in the last 3 months he is essentially had a 1 minute episode that occurred on October 26.  The defibrillator appropriately discriminated this as SVT. Interrogation of his defibrillator does not show new episodes of true VT/VF.  Lead parameters are normal.  Current generator voltage suggest 1 year of estimated longevity [employer](Saint Jude fortify implanted 2011).  Brief deviation and thoracic impedance around Thanksgiving has already returned to baseline.  He has enrolled in an intensive weight loss program.  He is more and more eager to undergo bariatric surgery.  His 37 year old daughter who is an excellent student is worried about going back to school since she may expose her parents to coronavirus.  Danny has severe heart disease and his wife has multiple sclerosis on immunosuppressant drugs.  His September 2019 echocardiogram showed an  ejection fraction of around 40%.  In September 2015 he underwent cavotricuspid isthmus ablation for atrial flutter. On 08/19/2014, while [place] he develop persistent rapid palpitations and felt unwell. He underwent successful overdrive pacing via his device by Dr. Hillis RangeJames Allred. The rhythm was atypical atrial flutter, cycle length roughly 390 ms. The episode lasted for about 3 hours until he was successfully overdrive paced. In the past he has had paroxysmal atrial fibrillation. The decision was made to ablate his flutter secondary to the occurrence of multiple unnecessary defibrillator shocks in the setting of atrial flutter with rapid ventricular rate.  His St. Jude Fortify DR model [id number] defibrillator is under advisory for possible unexpected battery depletion. He is not device dependent and has never had shocks for true ventricular arrhythmia (although he has had inappropriate shocks for atrial flutter with rapid AV conduction). We discussed the potential for device failure in a low percentage of this particular model. There is no reason to proceed with early generator change out in his particular case.   Past Medical History:  Diagnosis Date  . Allergy   . Anxiety   . Atypical atrial flutter (HCC) 08/19/14   pace terminated in device clinic.  AFL cycle length was 390 msec  . Automatic implantable cardioverter-defibrillator in situ    a. St Jude in 2011.  . Back pain   . Chronic anticoagulation   . Chronic systolic congestive heart failure (HCC)    a. suspected NICM - dx 2010. EF 15% by TEE, 10-20% by echo at that time. b. s/p St. Jude AICD  2011. c. Echo 01/2010: mod dilated LV, EF 30%, mod aortic root dilitation, no significant valvular disease.  . Depression   . Eczema   . Eczema   . Enlarged aorta (HCC)   . Hyperlipidemia   . Hypertension   . Mobitz type 2 second degree atrioventricular block    a. During sleeping hours in 2010 suspected due to ? OSA.  . Morbid obesity (HCC)    . Nonischemic cardiomyopathy (HCC)   . OSA treated with BiPAP 09/04/2018   Severe obstructive sleep apnea with an AHI of 67.2/h and nocturnal hypoxemia with oxygen saturations as low as 82%. Now on BIPAP at  18/14 cm H2O.  Marland Kitchen PAF (paroxysmal atrial fibrillation) (HCC)   . Palpitations   . Paroxysmal atrial flutter (HCC)    s/p ablation  . Pre-diabetes   . Shortness of breath   . Sleep apnea     Past Surgical History:  Procedure Laterality Date  . ATRIAL FLUTTER ABLATION N/A 01/18/2014   Procedure: ATRIAL FLUTTER ABLATION;  Surgeon: Duke Salvia, MD;  Location: [facility] CATH LAB;  Service: Cardiovascular;  Laterality: N/A;  . CARDIAC DEFIBRILLATOR PLACEMENT  02/17/10   St. Jude Medical 45DR, model number [id number], serial number [id number]  . CARDIOVERSION N/A 12/29/2013   Procedure: CARDIOVERSION;  Surgeon: Duke Salvia, MD;  Location: [facility] OR;  Service: Cardiovascular;  Laterality: N/A;  . TOOTH EXTRACTION N/A 10/23/2012   Procedure: EXTRACTION TEETH 1, 16, 17, 30, 31;  Surgeon: [name], DDS;  Location: [facility] OR;  Service: Oral Surgery;  Laterality: N/A;    Outpatient Medications Prior to Visit  Medication Sig Dispense Refill  . ALPRAZolam (XANAX) 1 MG tablet Take 1 tablet (1 mg total) by mouth 3 (three) times daily as needed for anxiety. 30 tablet 0  . atorvastatin (LIPITOR) 40 MG tablet Take 1 tablet (40 mg total) by mouth daily with breakfast. (Needs visit with PCP) 90 tablet 3  . brompheniramine-pseudoephedrine-DM 30-2-10 MG/5ML syrup TAKE 5 ML (CC) BY MOUTH 4 TIMES DAILY AS NEEDED FOR UP TO 5 DAYS    . buPROPion (WELLBUTRIN XL) 150 MG 24 hr tablet Take 1 tablet (150 mg total) by mouth daily. 90 tablet 3  . carvedilol (COREG) 25 MG tablet Take 1.5 tablets (37.5 mg total) by mouth 2 (two) times daily with a meal. 270 tablet 3  . digoxin (LANOXIN) 0.125 MG tablet Take 1 tablet (125 mcg total) by mouth every other day. 45 tablet 2  . fluticasone (FLONASE) 50 MCG/ACT nasal spray Place 1  spray into both nostrils as needed. 16 g 11  . guaiFENesin (MUCINEX) 600 MG 12 hr tablet Take by mouth.    . loratadine (CLARITIN) 10 MG tablet Take 1 tablet (10 mg total) by mouth daily. 30 tablet 11  . metoprolol tartrate (LOPRESSOR) 25 MG tablet Take 1 tablet as needed for palpitations 90 tablet 3  . predniSONE (DELTASONE) 10 MG tablet TAKE 1 TABLET BY MOUTH TWICE DAILY FOR 5 DAYS    . sacubitril-valsartan (ENTRESTO) 49-51 MG Take 1 tablet by mouth 2 (two) times daily. 180 tablet 3  . sertraline (ZOLOFT) 100 MG tablet Take 1 tablet (100 mg total) by mouth at bedtime. 30 tablet 11  . triamcinolone cream (KENALOG) 0.1 % Apply topically 2 (two) times daily as needed. 453.6 g 2  . warfarin (COUMADIN) 5 MG tablet TAKE 1 TABLET BY MOUTH ONCE DAILY EXCEPT  TAKE  1  &  1/2  TABLET  EACH  FRIDAY  OR  AS  DIRECTED  BY  ANTICOAGULATION  CLINIC 100 tablet 3  . furosemide (LASIX) 40 MG tablet Take 1 tablet (40 mg total) by mouth every other day. 12 tablet 3   No facility-administered medications prior to visit.      Allergies:   Patient has no known allergies.   Social History   Socioeconomic History  . Marital status: Married    Spouse name: Not on file  . Number of children: Not on file  . Years of education: Not on file  . Highest education level: Not on file  Occupational History  . Not on file  Social Needs  . Financial resource strain: Not on file  . Food insecurity    Worry: Not on file    Inability: Not on file  . Transportation needs    Medical: Not on file    Non-medical: Not on file  Tobacco Use  . Smoking status: Never Smoker  . Smokeless tobacco: Never Used  Substance and Sexual Activity  . Alcohol use: No  . Drug use: No  . Sexual activity: Yes  Lifestyle  . Physical activity    Days per week: Not on file    Minutes per session: Not on file  . Stress: Not on file  Relationships  . Social Musician on phone: Not on file    Gets together: Not on file     Attends religious service: Not on file    Active member of club or organization: Not on file    Attends meetings of clubs or organizations: Not on file    Relationship status: Not on file  Other Topics Concern  . Not on file  Social History Narrative  . Not on file     Family History:  The patient's family history includes Anxiety disorder in his mother; Depression in his mother; Diabetes in his paternal grandmother; Heart Problems in his mother; Heart disease in his maternal grandfather and mother; Hodgkin's lymphoma (age of onset: 66) in his brother.   ROS:   Please see the history of present illness.    ROS All other systems are reviewed and are negative.   PHYSICAL EXAM:   VS:  BP 123/70   Pulse (!) 58   Temp (!) 97.2 F (36.2 C)   Ht 6\' 5"  (1.956 m)   Wt (!) 458 lb 3.2 oz (207.8 kg)   SpO2 98%   BMI 54.33 kg/m     General: Alert, oriented x3, no distress, morbidly obese.  Healthy left subclavian defibrillator site. Head: no evidence of trauma, PERRL, EOMI, no exophtalmos or lid lag, no myxedema, no xanthelasma; normal ears, nose and oropharynx Neck: normal jugular venous pulsations and no hepatojugular reflux; brisk carotid pulses without delay and no carotid bruits Chest: clear to auscultation, no signs of consolidation by percussion or palpation, normal fremitus, symmetrical and full respiratory excursions Cardiovascular: normal position and quality of the apical impulse, regular rhythm, normal first and second heart sounds, no murmurs, rubs or gallops Abdomen: no tenderness or distention, no masses by palpation, no abnormal pulsatility or arterial bruits, normal bowel sounds, no hepatosplenomegaly Extremities: no clubbing, cyanosis or edema; 2+ radial, ulnar and brachial pulses bilaterally; 2+ right femoral, posterior tibial and dorsalis pedis pulses; 2+ left femoral, posterior tibial and dorsalis pedis pulses; no subclavian or femoral bruits Neurological: grossly nonfocal  Psych: Normal mood and affect   Wt Readings from Last 3 Encounters:  04/09/19 Marland Kitchen)  458 lb 3.2 oz (207.8 kg)  03/28/19 (!) 452 lb (205 kg)  11/21/18 (!) 454 lb 6.4 oz (206.1 kg)      Studies/Labs Reviewed:   ECHO 01/27/2018: - Left ventricle: The cavity size was mildly dilated. There was mild concentric hypertrophy. Systolic function was normal. The estimated ejection fraction was 40%. Wall motion was normal; there were no regional wall motion abnormalities. Doppler parameters are consistent with abnormal left ventricular relaxation (grade 1 diastolic dysfunction). There was no evidence of elevated ventricular filling pressure by Doppler parameters. - Aortic valve: There was no regurgitation. - Aortic root: The aortic root was normal in size. - Mitral valve: There was mild regurgitation. Valve area by pressure half-time: 2.22 cm^2. - Left atrium: The atrium was mildly dilated. - Right ventricle: Pacer wire or catheter noted in right ventricle. Systolic function was normal. - Right atrium: Pacer wire or catheter noted in right atrium. - Tricuspid valve: There was mild regurgitation. - Pulmonic valve: There was mild regurgitation. - Inferior vena cava: The vessel was normal in size. - Pericardium, extracardiac: There was no pericardial effusion.  Impressions:  - There has been no significant change since the prior study on 07/31/2015, LVEF remains moderately decreased estimated at 40%.   Aortic root is dilated at the sinus level with maximum diameter 45 mm. Ascending aortic size is stable at 40 mm.  EKG:  EKG from 03/28/2019 shows NSR Recent Labs: 03/28/2019: ALT 39; BUN 9; Creatinine, Ser 0.79; Hemoglobin 12.2; Platelets 262; Potassium 4.4; Sodium 138; TSH 2.530   Lipid Panel    Component Value Date/Time   CHOL 122 03/28/2019 0820   TRIG 81 03/28/2019 0820   HDL 36 (L) 03/28/2019 0820   CHOLHDL 3.2 08/03/2018 1528   CHOLHDL 5.3 03/20/2009 0410   VLDL 22 03/20/2009 0410    LDLCALC 70 03/28/2019 0820   LDLDIRECT 52 03/26/2015 0824     ASSESSMENT:    1. Chronic combined systolic and diastolic heart failure (HCC)   2. Nonischemic cardiomyopathy (HCC)   3. Paroxysmal atrial fibrillation (HCC)   4. Long term current use of anticoagulant therapy   5. PAT (paroxysmal atrial tachycardia) (HCC)   6. ICD (implantable cardioverter-defibrillator), dual, in situ   7. Morbid obesity (HCC)   8. Dyslipidemia (high LDL; low HDL)   9. Diabetes mellitus type 2 in obese (HCC)   10. Essential hypertension      PLAN:  In order of problems listed above:  1. CHF: NYHA functional class III.  He has some orthostatic hypotension and I asked him to reduce his furosemide to 20 mg every other day.  Also skip the extra half tablet of carvedilol on mornings when he wants to be more active.  The carvedilol dose was increased in an effort to control his atrial tachycardia. On appropriate highest tolerated dose of  Entresto.  2. CMP: Nonischemic.  Etiology unclear.  Most recent LV ejection fraction 40%.   3. AFib: No recent recurrence. CHADSVasc 2 (CHF, HTN).   On appropriate anticoagulation. 4. Warfarin:  Has a home monitor, followed by PCP.  No bleeding complications. 5. PAT/atypical flutter: Thankfully there is been a marked reduction in the burden of atrial tachycardia/atypical atrial flutter which has previously caused inappropriate defibrillator discharges.  He did have a 1 minute episode in October but the SVT discriminators appropriately preventive therapy.  We had a plan in place for initiation of dofetilide but this was canceled due to the coronavirus lockdown.  Might remain an  option if the arrhythmia becomes refractory in the future. 6. AICD advisory: Device function is normal.  His St Jude device is under advisory for potential rapid battery depletion, but it in his particular case this is unlikely to be a serious issue.  He is not pacemaker dependent and has not required  appropriate defibrillator therapy for ventricular tachyarrhythmia. Will continue with frequent battery checks, but no plan for early generator change out at this time. 7. Obesity: Considering bariatric surgery.  Encouraged him to participate in all the recommendations from his bariatric clinic. 8. HLP: Similar to last year, he has mildly decreased HDL but otherwise excellent lipid parameters. 9. DM: Hemoglobin A1c shows that he has developed full-blown diabetes mellitus.  He is not yet on pharmacological therapy for this.  If lifestyle changes do not bring his A1c back under 7% I would strongly recommend using an SGLT2 inhibitor such as Jardiance or Farxiga, since these may be beneficial for heart failure outcomes.  If these medications are started I recommend stopping his loop diuretics completely. 10. HTN: BP has been rather low recently, in optimal range today.  Medication Adjustments/Labs and Tests Ordered: Current medicines are reviewed at length with the patient today.  Concerns regarding medicines are outlined above.  Medication changes, Labs and Tests ordered today are listed in the Patient Instructions below. Patient Instructions  Medication Instructions:  DECREASE the Furosemide to 20 mg every other day  *If you need a refill on your cardiac medications before your next appointment, please call your pharmacy*  Lab Work: None ordered If you have labs (blood work) drawn today and your tests are completely normal, you will receive your results only by: Marland Kitchen MyChart Message (if you have MyChart) OR . A paper copy in the mail If you have any lab test that is abnormal or we need to change your treatment, we will call you to review the results.  Testing/Procedures: None ordered  Follow-Up: At [facility], you and your health needs are our priority.  As part of our continuing mission to provide you with exceptional heart care, we have created designated Provider Care Teams.  These Care  Teams include your primary Cardiologist (physician) and Advanced Practice Providers (APPs -  Physician Assistants and Nurse Practitioners) who all work together to provide you with the care you need, when you need it.  Your next appointment:   6 month(s)  The format for your next appointment:   In Person  Provider:   Sanda Klein, MD         Signed, Sanda Klein, MD  04/09/2019 5:08 PM    [facility] [address], [place], [place]  [address] Phone: [phone]; Fax: [phone]

## 2019-04-11 ENCOUNTER — Ambulatory Visit (INDEPENDENT_AMBULATORY_CARE_PROVIDER_SITE_OTHER): Payer: Medicaid Other | Admitting: Family Medicine

## 2019-04-11 ENCOUNTER — Encounter (INDEPENDENT_AMBULATORY_CARE_PROVIDER_SITE_OTHER): Payer: Self-pay | Admitting: Family Medicine

## 2019-04-11 ENCOUNTER — Other Ambulatory Visit: Payer: Self-pay

## 2019-04-11 ENCOUNTER — Encounter: Payer: Self-pay | Admitting: Gastroenterology

## 2019-04-11 VITALS — BP 100/64 | HR 59 | Temp 97.9°F | Ht 75.0 in | Wt >= 6400 oz

## 2019-04-11 DIAGNOSIS — Z6841 Body Mass Index (BMI) 40.0 and over, adult: Secondary | ICD-10-CM | POA: Diagnosis not present

## 2019-04-11 DIAGNOSIS — E559 Vitamin D deficiency, unspecified: Secondary | ICD-10-CM

## 2019-04-11 DIAGNOSIS — K529 Noninfective gastroenteritis and colitis, unspecified: Secondary | ICD-10-CM | POA: Diagnosis not present

## 2019-04-11 DIAGNOSIS — E119 Type 2 diabetes mellitus without complications: Secondary | ICD-10-CM | POA: Diagnosis not present

## 2019-04-11 DIAGNOSIS — D508 Other iron deficiency anemias: Secondary | ICD-10-CM

## 2019-04-11 DIAGNOSIS — I10 Essential (primary) hypertension: Secondary | ICD-10-CM

## 2019-04-11 DIAGNOSIS — E786 Lipoprotein deficiency: Secondary | ICD-10-CM | POA: Diagnosis not present

## 2019-04-11 DIAGNOSIS — E538 Deficiency of other specified B group vitamins: Secondary | ICD-10-CM

## 2019-04-11 MED ORDER — VITAMIN D (ERGOCALCIFEROL) 1.25 MG (50000 UNIT) PO CAPS: 50000.0000 [IU] | ORAL_CAPSULE | ORAL | 0 refills | Status: DC

## 2019-04-11 MED ORDER — RYBELSUS 3 MG PO TABS: 3.0000 mg | ORAL_TABLET | Freq: Every day | ORAL | 0 refills | Status: DC

## 2019-04-11 NOTE — Progress Notes (Signed)
Office: [phone]  /  Fax: [phone]   HPI:   Chief Complaint: OBESITY Danny Hinton is here to discuss his progress with his obesity treatment plan. He is on the Category 4 plan and is following his eating plan approximately 80 % of the time. He states he is walking for 30 minutes 3 times per week. Danny Hinton is tolerating his diet well. He is still with diarrhea bowel movement after eating. He is sometimes dizzy from low blood pressure. He just saw Cardiology.  His weight is (!) 451 lb (204.6 kg) today and has had a weight loss of 1 pound over a period of 2 weeks since his last visit. He has lost 1 lb since starting treatment with [place].  Diabetes II Danny Hinton has a diagnosis of diabetes type II. Danny Hinton levels are worsening. Last A1c was 7.8. He denies hypoglycemia. He has been working on intensive lifestyle modifications including diet, exercise, and weight loss to help control his blood glucose levels.  Vitamin B12 Deficiency Danny Hinton has a diagnosis of B12 insufficiency. Last vitamin B12 level was 214. He is not a vegetarian and does not have a previous diagnosis of pernicious anemia. He does not have a history of weight loss surgery.   Vitamin D Deficiency Danny Hinton has a diagnosis of vitamin D deficiency. He is not currently taking Vit D. Last Vit D level was 6.9. He denies nausea, vomiting or muscle weakness.  Anemia Danny Hinton has a diagnosis of anemia, mild. Last Hgb was 12.2 and MCV of 84. He is not on iron supplementation.   Low HDL Danny Hinton HDL is low at 36.  Chronic Diarrhea Danny Hinton has a diagnosis of chronic diarrhea. C/W irritable bowel syndrome-D versus colitis.  Hypertension Danny Hinton Danny Hinton is a 38 y.o. male with hypertension. Danny Hinton blood pressure is low. He seen Cardiology recently. His Coreg was decreased to 1 tablet and Lasix to 1/2 tablet. He is working on weight loss to help control his blood pressure with the goal of decreasing his risk of heart attack and stroke.   ASSESSMENT AND PLAN:  B12  nutritional deficiency  Type 2 diabetes mellitus without complication, without long-term current use of insulin ([place]) - Plan: Semaglutide (RYBELSUS) 3 MG TABS  Vitamin D deficiency - Plan: Vitamin D, Ergocalciferol, (DRISDOL) 1.25 MG (50000 UT) CAPS capsule  Low HDL (under 40)  Chronic diarrhea - Plan: Ambulatory referral to Gastroenterology  Essential hypertension  Other iron deficiency anemia, mild  Class 3 severe obesity due to excess calories with serious comorbidity and body mass index (BMI) of 50.0 to 59.9 in adult [facility])  PLAN:  Diabetes II Danny Hinton has been given diabetes education by myself including ideal fasting and post-prandial blood glucose readings, individual ideal Hgb A1c goals and hypoglycemia prevention. We discussed the importance of good blood sugar control to decrease the likelihood of diabetic complications such as nephropathy, neuropathy, limb loss, blindness, coronary artery disease, and death. We discussed the importance of intensive lifestyle modification including diet, exercise and weight loss as the first line treatment for diabetes. Danny Hinton agrees to start Rybelsus 3 mg PO daily #30 with no refills. Danny Hinton agrees to follow up with our clinic in 2 weeks.  Vitamin B12 Deficiency Danny Hinton will work on increasing B12 rich foods in his diet. I recommended Danny Hinton to start a B12 supplementation at 1,000 mcg sublingual daily. Danny Hinton agrees to follow up with our clinic in 2 weeks.  Vitamin D Deficiency Danny Hinton was informed that low vitamin D levels contributes to fatigue and  are associated with obesity, breast, and colon cancer. Danny Hinton agrees to start prescription Vit D 50,000 IU every week #4 with no refills. He will follow up for routine testing of vitamin D, at least 2-3 times per year. He was informed of the risk of over-replacement of vitamin D and agrees to not increase his dose unless he discusses this with us first. Danny Hinton agrees to follow up with our clinic in 2 weeks.  Anemia The  diagnosis of anemia was discussed with Danny Hinton and was explained in detail. He was given suggestions of iron rich foods and iron supplement was not prescribed. We will check anemia panel at his next lab visit.  Low HDL We will continue to monitor.  Chronic Diarrhea We have sent a referral to [facility] for evaluation. We will continue to monitor.  Hypertension We discussed sodium restriction, working on healthy weight loss, and a regular exercise program as the means to achieve improved blood pressure control. Danny Hinton agreed with this plan and agreed to follow up as directed. We will continue to monitor his blood pressure as well as his progress with the above lifestyle modifications. Danny Hinton agrees to continue his medications as prescribed and will watch for signs of hypotension as he continues his lifestyle modifications. Danny Hinton agrees to follow up with our clinic in 2 weeks.  Obesity Danny Hinton is currently in the action stage of change. As such, his goal is to continue with weight loss efforts He has agreed to follow the Category 4 plan Danny Hinton has been instructed to work up to a goal of 150 minutes of combined cardio and strengthening exercise per week or as tolerated for weight loss and overall health benefits. We discussed the following Behavioral Modification Strategies today: increasing lean protein intake, decreasing simple carbohydrates, increasing vegetables, increase H20 intake, no skipping meals, work on meal planning and easy cooking plans and holiday eating strategies    Danny Hinton has agreed to follow up with our clinic in 2 weeks. He was informed of the importance of frequent follow up visits to maximize his success with intensive lifestyle modifications for his multiple health conditions.  ALLERGIES: No Known Allergies  MEDICATIONS: Current Outpatient Medications on File Prior to Visit  Medication Sig Dispense Refill  . ALPRAZolam (XANAX) 1 MG tablet Take 1 tablet (1 mg total) by  mouth 3 (three) times daily as needed for anxiety. 30 tablet 0  . atorvastatin (LIPITOR) 40 MG tablet Take 1 tablet (40 mg total) by mouth daily with breakfast. (Needs visit with PCP) 90 tablet 3  . buPROPion (WELLBUTRIN XL) 150 MG 24 hr tablet Take 1 tablet (150 mg total) by mouth daily. 90 tablet 3  . carvedilol (COREG) 25 MG tablet Take 1.5 tablets (37.5 mg total) by mouth 2 (two) times daily with a meal. 270 tablet 3  . digoxin (LANOXIN) 0.125 MG tablet Take 1 tablet (125 mcg total) by mouth every other day. 45 tablet 2  . fluticasone (FLONASE) 50 MCG/ACT nasal spray Place 1 spray into both nostrils as needed. 16 g 11  . furosemide (LASIX) 20 MG tablet Take 1 tablet (20 mg total) by mouth every other day. 15 tablet 5  . loratadine (CLARITIN) 10 MG tablet Take 1 tablet (10 mg total) by mouth daily. 30 tablet 11  . metoprolol tartrate (LOPRESSOR) 25 MG tablet Take 1 tablet as needed for palpitations 90 tablet 3  . sacubitril-valsartan (ENTRESTO) 49-51 MG Take 1 tablet by mouth 2 (two) times daily. 180 tablet 3  .  sertraline (ZOLOFT) 100 MG tablet Take 1 tablet (100 mg total) by mouth at bedtime. 30 tablet 11  . warfarin (COUMADIN) 5 MG tablet TAKE 1 TABLET BY MOUTH ONCE DAILY EXCEPT  TAKE  1  &  1/2  TABLET  EACH  FRIDAY  OR  AS  DIRECTED  BY  ANTICOAGULATION  CLINIC 100 tablet 3   No current facility-administered medications on file prior to visit.     PAST MEDICAL HISTORY: Past Medical History:  Diagnosis Date  . Allergy   . Anxiety   . Atypical atrial flutter (HCC) 08/19/14   pace terminated in device clinic.  AFL cycle length was 390 msec  . Automatic implantable cardioverter-defibrillator in situ    a. St Jude in 2011.  . Back pain   . Chronic anticoagulation   . Chronic systolic congestive heart failure (HCC)    a. suspected NICM - dx 2010. EF 15% by TEE, 10-20% by echo at that time. b. s/p St. Jude AICD 2011. c. Echo 01/2010: mod dilated LV, EF 30%, mod aortic root dilitation, no  significant valvular disease.  . Depression   . Eczema   . Eczema   . Enlarged aorta (HCC)   . Hyperlipidemia   . Hypertension   . Mobitz type 2 second degree atrioventricular block    a. During sleeping hours in 2010 suspected due to ? OSA.  . Morbid obesity (HCC)   . Nonischemic cardiomyopathy (HCC)   . OSA treated with BiPAP 09/04/2018   Severe obstructive sleep apnea with an AHI of 67.2/h and nocturnal hypoxemia with oxygen saturations as low as 82%. Now on BIPAP at  18/14 cm H2O.  Marland Kitchen PAF (paroxysmal atrial fibrillation) (HCC)   . Palpitations   . Paroxysmal atrial flutter (HCC)    s/p ablation  . Pre-diabetes   . Shortness of breath   . Sleep apnea     PAST SURGICAL HISTORY: Past Surgical History:  Procedure Laterality Date  . ATRIAL FLUTTER ABLATION N/A 01/18/2014   Procedure: ATRIAL FLUTTER ABLATION;  Surgeon: Duke Salvia, MD;  Location: [facility] CATH LAB;  Service: Cardiovascular;  Laterality: N/A;  . CARDIAC DEFIBRILLATOR PLACEMENT  02/17/10   St. Jude Medical 45DR, model number [id number], serial number [id number]  . CARDIOVERSION N/A 12/29/2013   Procedure: CARDIOVERSION;  Surgeon: Duke Salvia, MD;  Location: [facility] OR;  Service: Cardiovascular;  Laterality: N/A;  . TOOTH EXTRACTION N/A 10/23/2012   Procedure: EXTRACTION TEETH 1, 16, 17, 30, 31;  Surgeon: [name], DDS;  Location: [facility] OR;  Service: Oral Surgery;  Laterality: N/A;    SOCIAL HISTORY: Social History   Tobacco Use  . Smoking status: Never Smoker  . Smokeless tobacco: Never Used  Substance Use Topics  . Alcohol use: No  . Drug use: No    FAMILY HISTORY: Family History  Problem Relation Age of Onset  . Hodgkin's lymphoma Brother 38  . Heart disease Mother   . Heart Problems Mother        atrial flutter  . Depression Mother   . Anxiety disorder Mother   . Heart disease Maternal Grandfather   . Diabetes Paternal Grandmother     ROS: Review of Systems  Constitutional: Positive for weight loss.    Cardiovascular: Negative for chest pain.  Gastrointestinal: Positive for diarrhea. Negative for nausea and vomiting.  Musculoskeletal:       Negative muscle weakness  Neurological: Positive for dizziness.  Endo/Heme/Allergies:  Negative hypoglycemia    PHYSICAL EXAM: Blood pressure 100/64, pulse (!) 59, temperature 97.9 F (36.6 C), temperature source Oral, height 6\' 3"  (1.905 m), weight (!) 451 lb (204.6 kg), SpO2 94 %. Body mass index is 56.37 kg/m. Physical Exam Vitals signs reviewed.  Constitutional:      Appearance: Normal appearance. He is obese.  Cardiovascular:     Rate and Rhythm: Normal rate.     Pulses: Normal pulses.  Pulmonary:     Effort: Pulmonary effort is normal.     Breath sounds: Normal breath sounds.  Musculoskeletal: Normal range of motion.  Skin:    General: Skin is warm and dry.  Neurological:     Mental Status: He is alert and oriented to person, place, and time.  Psychiatric:        Mood and Affect: Mood normal.        Behavior: Behavior normal.     RECENT LABS AND TESTS: BMET    Component Value Date/Time   NA 138 03/28/2019 0820   K 4.4 03/28/2019 0820   CL 100 03/28/2019 0820   CO2 24 03/28/2019 0820   GLUCOSE 174 (H) 03/28/2019 0820   GLUCOSE 88 01/11/2014 1302   BUN 9 03/28/2019 0820   CREATININE 0.79 03/28/2019 0820   CREATININE 0.78 07/27/2012 1330   CALCIUM 8.4 (L) 03/28/2019 0820   GFRNONAA 115 03/28/2019 0820   GFRAA 133 03/28/2019 0820   Lab Results  Component Value Date   HGBA1C 7.8 (H) 03/28/2019   HGBA1C 7.0 (H) 12/13/2017   HGBA1C  03/19/2009    5.9 (NOTE) The ADA recommends the following therapeutic goal for glycemic control related to Hgb A1c measurement: Goal of therapy: <6.5 Hgb A1c  Reference: American Diabetes Association: Clinical Practice Recommendations 2010, Diabetes Care, 2010, 33: (Suppl  1).   Lab Results  Component Value Date   INSULIN 99.3 (H) 03/28/2019   CBC    Component Value Date/Time    WBC 3.8 03/28/2019 0820   WBC 4.0 01/11/2014 1302   RBC 4.51 03/28/2019 0820   RBC 4.76 01/11/2014 1302   HGB 12.2 (L) 03/28/2019 0820   HCT 37.7 03/28/2019 0820   PLT 262 03/28/2019 0820   MCV 84 03/28/2019 0820   MCH 27.1 03/28/2019 0820   MCH 27.1 12/29/2013 0244   MCHC 32.4 03/28/2019 0820   MCHC 32.8 01/11/2014 1302   RDW 14.5 03/28/2019 0820   LYMPHSABS 1.2 03/28/2019 0820   MONOABS 0.4 01/11/2014 1302   EOSABS 0.2 03/28/2019 0820   BASOSABS 0.0 03/28/2019 0820   Iron/TIBC/Ferritin/ %Sat    Component Value Date/Time   IRON 44 03/22/2009 0440   TIBC 278 03/22/2009 0440   FERRITIN 110 03/22/2009 0440   IRONPCTSAT 16 (L) 03/22/2009 0440   Lipid Panel     Component Value Date/Time   CHOL 122 03/28/2019 0820   TRIG 81 03/28/2019 0820   HDL 36 (L) 03/28/2019 0820   CHOLHDL 3.2 08/03/2018 1528   CHOLHDL 5.3 03/20/2009 0410   VLDL 22 03/20/2009 0410   LDLCALC 70 03/28/2019 0820   LDLDIRECT 52 03/26/2015 0824   Hepatic Function Panel     Component Value Date/Time   PROT 7.4 03/28/2019 0820   ALBUMIN 4.0 03/28/2019 0820   AST 18 03/28/2019 0820   ALT 39 03/28/2019 0820   ALKPHOS 88 03/28/2019 0820   BILITOT 0.7 03/28/2019 0820   BILIDIR 0.16 04/23/2015 0833   IBILI 1.2 (H) 03/24/2009 0320  Component Value Date/Time   TSH 2.530 03/28/2019 0820   TSH 1.670 01/22/2015 1114   TSH 2.460 12/27/2013 0603      OBESITY BEHAVIORAL INTERVENTION VISIT  Today's visit was # 2   Starting weight: 452 lbs Starting date: 03/28/2019 Today's weight : 451 lbs Today's date: 04/11/2019 Total lbs lost to date: 1 At least 15 minutes were spent on discussing the following behavioral intervention visit.   ASK: We discussed the diagnosis of obesity with Danny Hinton today and Danny agreed to give [place] permission to discuss obesity behavioral modification therapy today.  ASSESS: Danny has the diagnosis of obesity and his BMI today is 53.37 Danny is in the action stage of  change   ADVISE: Danny was educated on the multiple health risks of obesity as well as the benefit of weight loss to improve his health. He was advised of the need for long term treatment and the importance of lifestyle modifications to improve his current health and to decrease his risk of future health problems.  AGREE: Multiple dietary modification options and treatment options were discussed and  Danny agreed to follow the recommendations documented in the above note.  ARRANGE: Danny was educated on the importance of frequent visits to treat obesity as outlined per CMS and USPSTF guidelines and agreed to schedule his next follow up appointment today.  Trude Mcburney, am acting as transcriptionist for Helane Rima, DO  I have reviewed the above documentation for accuracy and completeness, and I agree with the above. Helane Rima, DO

## 2019-04-12 ENCOUNTER — Encounter (INDEPENDENT_AMBULATORY_CARE_PROVIDER_SITE_OTHER): Payer: Self-pay | Admitting: Family Medicine

## 2019-04-16 ENCOUNTER — Encounter (INDEPENDENT_AMBULATORY_CARE_PROVIDER_SITE_OTHER): Payer: Self-pay | Admitting: Family Medicine

## 2019-04-16 NOTE — Telephone Encounter (Signed)
Please advise,  Patient has MCD and Rybelsus is not covered, Victoza is preferred.

## 2019-04-17 NOTE — Telephone Encounter (Signed)
Please advise 

## 2019-04-18 ENCOUNTER — Other Ambulatory Visit (INDEPENDENT_AMBULATORY_CARE_PROVIDER_SITE_OTHER): Payer: Self-pay

## 2019-04-18 DIAGNOSIS — E119 Type 2 diabetes mellitus without complications: Secondary | ICD-10-CM

## 2019-04-18 MED ORDER — VICTOZA 18 MG/3ML ~~LOC~~ SOPN: 0.6000 mg | PEN_INJECTOR | SUBCUTANEOUS | 0 refills | Status: DC

## 2019-04-18 MED ORDER — BD PEN NEEDLE NANO 2ND GEN 32G X 4 MM MISC: 1.0000 | Freq: Two times a day (BID) | 0 refills | Status: DC

## 2019-04-18 NOTE — Telephone Encounter (Signed)
Please advise 

## 2019-04-18 NOTE — Telephone Encounter (Signed)
Prescription was sent to the pharmacy. I spoke with the pt and he voiced understanding. Moshe Salisbury, CMA

## 2019-04-25 ENCOUNTER — Ambulatory Visit (INDEPENDENT_AMBULATORY_CARE_PROVIDER_SITE_OTHER): Payer: Medicaid Other | Admitting: Family Medicine

## 2019-04-29 ENCOUNTER — Encounter (INDEPENDENT_AMBULATORY_CARE_PROVIDER_SITE_OTHER): Payer: Self-pay | Admitting: Family Medicine

## 2019-04-29 DIAGNOSIS — E538 Deficiency of other specified B group vitamins: Secondary | ICD-10-CM

## 2019-04-29 DIAGNOSIS — E785 Hyperlipidemia, unspecified: Secondary | ICD-10-CM | POA: Insufficient documentation

## 2019-04-29 DIAGNOSIS — E559 Vitamin D deficiency, unspecified: Secondary | ICD-10-CM | POA: Insufficient documentation

## 2019-04-29 DIAGNOSIS — I712 Thoracic aortic aneurysm, without rupture, unspecified: Secondary | ICD-10-CM

## 2019-04-29 DIAGNOSIS — E1169 Type 2 diabetes mellitus with other specified complication: Secondary | ICD-10-CM

## 2019-04-29 DIAGNOSIS — I7121 Aneurysm of the ascending aorta, without rupture: Secondary | ICD-10-CM | POA: Insufficient documentation

## 2019-04-29 DIAGNOSIS — E119 Type 2 diabetes mellitus without complications: Secondary | ICD-10-CM | POA: Insufficient documentation

## 2019-04-29 DIAGNOSIS — F329 Major depressive disorder, single episode, unspecified: Secondary | ICD-10-CM | POA: Insufficient documentation

## 2019-04-29 DIAGNOSIS — F32A Depression, unspecified: Secondary | ICD-10-CM | POA: Insufficient documentation

## 2019-04-29 DIAGNOSIS — D649 Anemia, unspecified: Secondary | ICD-10-CM | POA: Insufficient documentation

## 2019-04-29 HISTORY — DX: Thoracic aortic aneurysm, without rupture: I71.2

## 2019-04-29 HISTORY — DX: Type 2 diabetes mellitus without complications: E11.9

## 2019-04-29 HISTORY — DX: Deficiency of other specified B group vitamins: E53.8

## 2019-04-29 HISTORY — DX: Vitamin D deficiency, unspecified: E55.9

## 2019-04-29 HISTORY — DX: Type 2 diabetes mellitus with other specified complication: E11.69

## 2019-04-29 HISTORY — DX: Thoracic aortic aneurysm, without rupture, unspecified: I71.20

## 2019-04-30 ENCOUNTER — Other Ambulatory Visit: Payer: Self-pay

## 2019-04-30 ENCOUNTER — Ambulatory Visit (INDEPENDENT_AMBULATORY_CARE_PROVIDER_SITE_OTHER): Payer: Medicaid Other | Admitting: Family Medicine

## 2019-04-30 ENCOUNTER — Encounter (INDEPENDENT_AMBULATORY_CARE_PROVIDER_SITE_OTHER): Payer: Self-pay | Admitting: Family Medicine

## 2019-04-30 VITALS — BP 105/64 | HR 58 | Temp 98.0°F | Ht 75.0 in | Wt >= 6400 oz

## 2019-04-30 DIAGNOSIS — E1169 Type 2 diabetes mellitus with other specified complication: Secondary | ICD-10-CM

## 2019-04-30 DIAGNOSIS — E559 Vitamin D deficiency, unspecified: Secondary | ICD-10-CM

## 2019-04-30 DIAGNOSIS — E785 Hyperlipidemia, unspecified: Secondary | ICD-10-CM

## 2019-04-30 DIAGNOSIS — I152 Hypertension secondary to endocrine disorders: Secondary | ICD-10-CM

## 2019-04-30 DIAGNOSIS — R1084 Generalized abdominal pain: Secondary | ICD-10-CM | POA: Diagnosis not present

## 2019-04-30 DIAGNOSIS — E538 Deficiency of other specified B group vitamins: Secondary | ICD-10-CM

## 2019-04-30 DIAGNOSIS — E1159 Type 2 diabetes mellitus with other circulatory complications: Secondary | ICD-10-CM | POA: Diagnosis not present

## 2019-04-30 DIAGNOSIS — I10 Essential (primary) hypertension: Secondary | ICD-10-CM

## 2019-04-30 DIAGNOSIS — Z6841 Body Mass Index (BMI) 40.0 and over, adult: Secondary | ICD-10-CM | POA: Diagnosis not present

## 2019-04-30 DIAGNOSIS — G4733 Obstructive sleep apnea (adult) (pediatric): Secondary | ICD-10-CM | POA: Diagnosis not present

## 2019-05-02 ENCOUNTER — Encounter (INDEPENDENT_AMBULATORY_CARE_PROVIDER_SITE_OTHER): Payer: Self-pay | Admitting: Family Medicine

## 2019-05-02 NOTE — Progress Notes (Addendum)
Office: [phone]  /  Fax: [phone]   HPI:  Chief Complaint: OBESITY Danny Hinton is here to discuss his progress with his obesity treatment plan. He is on the Category 4 plan and states he is following his eating plan approximately 60 % of the time. He states he is exercising 0 minutes 0 times per week.  Danny Hinton was unable to follow the diet well due to early satiety and bloating, and needing to stool after eating. He notes worsening generalized abdominal discomfort. He denies fever, vomiting, melena, or hematochezia.  Today's visit was # 3  Starting weight: 452 lbs Starting date: 03/28/2019 Today's weight : 454 lbs Today's date: 04/30/2019 Total lbs lost to date: 0 Total lbs lost since last in-office visit: 0  Vitamin D Deficiency Danny Hinton has a diagnosis of vitamin D deficiency. He is currently taking prescription Vit D.  Diabetes II Danny Hinton has a diagnosis of diabetes type II. His last A1c was 7.8. he has not picked up his Victoza yet (prescription was given at last visit).  Vitamin B12 Deficiency Danny Hinton has a diagnosis of B12 deficiency. He is currently taking B12 supplementation.  Lab Results  Component Value Date   VITAMINB12 214 (L) 03/28/2019   Obstructive Sleep Apnea Danny Hinton has a diagnosis of obstructive sleep apnea. He is using BiPAP and is compliant.  Hyperlipidemia Danny Hinton has a diagnosis of hyperlipidemia, associated with diabetes mellitus. He is taking Lipitor and denies myalgias.  Lab Results  Component Value Date   CHOL 122 03/28/2019   HDL 36 (L) 03/28/2019   LDLCALC 70 03/28/2019   LDLDIRECT 52 03/26/2015   TRIG 81 03/28/2019   CHOLHDL 3.2 08/03/2018   Lab Results  Component Value Date   ALT 39 03/28/2019   AST 18 03/28/2019   ALKPHOS 88 03/28/2019   BILITOT 0.7 03/28/2019     Hypertension Danny Hinton has a diagnosis of hypertension, associated with diabetes mellitus. His blood pressure is at goal.  BP Readings from Last 3 Encounters:  04/30/19 105/64  04/11/19  100/64  04/09/19 123/70   Abdominal Pain Danny Hinton notes abdominal pain. He has a appointment with GI on January 8th, but he notes worsening symptoms.  ASSESSMENT AND PLAN:  Vitamin D deficiency, vitamin D = 6.9 (03/28/19)  Type 2 diabetes mellitus with other specified complication, without long-term current use of insulin (HCC), A1c = 7.8 (03/28/19)  B12 nutritional deficiency, B12 = 214 (03/18/19)  OSA treated with BiPAP  Hypertension associated with diabetes (HCC)  Dyslipidemia associated with type 2 diabetes mellitus (HCC)  Generalized abdominal pain - Plan: CT Abdomen Pelvis W Contrast  Class 3 severe obesity with serious comorbidity and body mass index (BMI) of 50.0 to 59.9 in adult, unspecified obesity type (HCC)  PLAN:  Vitamin D Deficiency Low vitamin D level contributes to fatigue and are associated with obesity, breast, and colon cancer. Danny Hinton agrees to continue taking prescription Vit D 50,000 IU every week #4 and we will refill for 1 month. He will follow up for routine testing of vitamin D, at least 2-3 times per year to avoid over-replacement. We will continue to monitor.  Diabetes II Danny Hinton has been given diabetes education by myself today. Good blood sugar control is important to decrease the likelihood of diabetic complications such as nephropathy, neuropathy, limb loss, blindness, coronary artery disease, and death. Intensive lifestyle modification including diet, exercise and weight loss were discussed as the first line treatment for diabetes. Danny Hinton is to hold off on Victoza until after his CT.  We will continue to monitor.  Vitamin B12 Deficiency Danny Hinton will work on increasing B12 rich foods in his diet. B12 supplementation was not prescribed today. We will continue to monitor.  Obstructive Sleep Apnea Danny Hinton is to continue to be compliant with his BiPAP, and we will continue to monitor.  Hyperlipidemia Intensive lifestyle modifications as the first line treatment for  hyperlipidemia. We discussed many lifestyle modifications today and Danny Hinton will continue to work on diet, exercise and weight loss efforts. We will continue to monitor.  Hypertension Danny Hinton is working on healthy weight loss and exercise to improve blood pressure control. We will watch for signs of hypotension as he continues his lifestyle modifications. We will continue to monitor.  Abdominal Pain We have sent a referral to GI and will obtain a CT abdominal and pelvis with oral and IV contrast to evaluate for cause of chronic, worsening abdominal pain.  Obesity Danny Hinton is currently in the action stage of change. As such, his goal is to continue with weight loss efforts He has agreed to follow the Category 4 plan. Danny Hinton has been instructed to work up to a goal of 150 minutes of combined cardio and strengthening exercise per week for weight loss and overall health benefits. We discussed the following Behavioral Modification Strategies today: decreasing simple carbohydrates, increase H20 intake, and work on meal planning and easy cooking plans.  Danny Hinton has agreed to follow up with our clinic in 2 weeks. He was informed of the importance of frequent follow up visits to maximize his success with intensive lifestyle modifications for his multiple health conditions.  ALLERGIES: No Known Allergies  MEDICATIONS: Current Outpatient Medications on File Prior to Visit  Medication Sig Dispense Refill  . ALPRAZolam (XANAX) 1 MG tablet Take 1 tablet (1 mg total) by mouth 3 (three) times daily as needed for anxiety. 30 tablet 0  . atorvastatin (LIPITOR) 40 MG tablet Take 1 tablet (40 mg total) by mouth daily with breakfast. (Needs visit with PCP) 90 tablet 3  . buPROPion (WELLBUTRIN XL) 150 MG 24 hr tablet Take 1 tablet (150 mg total) by mouth daily. 90 tablet 3  . carvedilol (COREG) 25 MG tablet Take 1.5 tablets (37.5 mg total) by mouth 2 (two) times daily with a meal. 270 tablet 3  . digoxin (LANOXIN) 0.125 MG  tablet Take 1 tablet (125 mcg total) by mouth every other day. 45 tablet 2  . fluticasone (FLONASE) 50 MCG/ACT nasal spray Place 1 spray into both nostrils as needed. 16 g 11  . furosemide (LASIX) 20 MG tablet Take 1 tablet (20 mg total) by mouth every other day. 15 tablet 5  . Insulin Pen Needle (BD PEN NEEDLE NANO 2ND GEN) 32G X 4 MM MISC 1 Package by Does not apply route 2 (two) times daily. 100 each 0  . liraglutide (VICTOZA) 18 MG/3ML SOPN Inject 0.1 mLs (0.6 mg total) into the skin every morning. 1 pen 0  . loratadine (CLARITIN) 10 MG tablet Take 1 tablet (10 mg total) by mouth daily. 30 tablet 11  . metoprolol tartrate (LOPRESSOR) 25 MG tablet Take 1 tablet as needed for palpitations 90 tablet 3  . sacubitril-valsartan (ENTRESTO) 49-51 MG Take 1 tablet by mouth 2 (two) times daily. 180 tablet 3  . sertraline (ZOLOFT) 100 MG tablet Take 1 tablet (100 mg total) by mouth at bedtime. 30 tablet 11  . Vitamin D, Ergocalciferol, (DRISDOL) 1.25 MG (50000 UT) CAPS capsule Take 1 capsule (50,000 Units total) by mouth every  7 (seven) days. 4 capsule 0  . warfarin (COUMADIN) 5 MG tablet TAKE 1 TABLET BY MOUTH ONCE DAILY EXCEPT  TAKE  1  &  1/2  TABLET  EACH  FRIDAY  OR  AS  DIRECTED  BY  ANTICOAGULATION  CLINIC 100 tablet 3   No current facility-administered medications on file prior to visit.    PAST MEDICAL HISTORY: Past Medical History:  Diagnosis Date  . Anxiety   . Atypical atrial flutter (HCC) 08/19/2014   Pace terminated in device clinic.  AFL cycle length was 390 msec.  Marland Kitchen. Automatic implantable cardioverter-defibrillator in situ    a. St Jude in 2011.  . B12 nutritional deficiency, B12 = 214 (03/18/19) 04/29/2019  . Chronic anticoagulation with Coumadin   . Chronic systolic congestive heart failure (HCC)    a. suspected NICM - dx 2010. EF 15% by TEE, 10-20% by echo at that time. b. s/p St. Jude AICD 2011. c. Echo 01/2010: mod dilated LV, EF 30%, mod aortic root dilitation, no significant  valvular disease.  . Depression   . Diabetes mellitus (HCC), Rx Victoza 04/29/2019  . Dyslipidemia associated with type 2 diabetes mellitus (HCC), Rx Lipitor 04/29/2019  . Eczema   . Enlarged aorta (HCC)   . Hypertension associated with diabetes (HCC)   . Mobitz type 2 second degree atrioventricular block    a. During sleeping hours in 2010 suspected due to OSA.  . Morbid obesity (HCC)   . Nonischemic cardiomyopathy (HCC)   . OSA treated with BiPAP 09/04/2018   Severe obstructive sleep apnea with an AHI of 67.2/h and nocturnal hypoxemia with oxygen saturations as low as 82%. Now on BIPAP at 18/14 cm H2O.  Marland Kitchen. PAF (paroxysmal atrial fibrillation) (HCC)   . Palpitations   . Paroxysmal atrial flutter (HCC), s/p ablation   . Seasonal allergies   . Sleep apnea   . Thoracic aortic aneurysm (HCC), monitored annually 04/29/2019  . Vitamin D deficiency, vitamin D = 6.9 (03/28/19) 04/29/2019    PAST SURGICAL HISTORY: Past Surgical History:  Procedure Laterality Date  . ATRIAL FLUTTER ABLATION N/A 01/18/2014   Procedure: ATRIAL FLUTTER ABLATION;  Surgeon: Duke SalviaSteven C Klein, MD;  [facility];  Service: Cardiovascular;  Laterality: N/A;  . CARDIAC DEFIBRILLATOR PLACEMENT  02/17/10   St. Jude Medical 45DR, model number [id number], serial number [id number]  . CARDIOVERSION N/A 12/29/2013   Procedure: CARDIOVERSION;  Surgeon: Duke SalviaSteven C Klein, MD;  [facility];  Service: Cardiovascular;  Laterality: N/A;  . TOOTH EXTRACTION N/A 10/23/2012   Procedure: EXTRACTION TEETH 1, 16, 17, 30, 31;  Surgeon: Georgia LopesScott M Jensen, DDS;  Location: [facility] OR;  Service: Oral Surgery;  Laterality: N/A;    SOCIAL HISTORY: Social History   Tobacco Use  . Smoking status: Never Smoker  . Smokeless tobacco: Never Used  Substance Use Topics  . Alcohol use: No  . Drug use: No    FAMILY HISTORY: Family History  Problem Relation Age of Onset  . Hodgkin's lymphoma Brother 5118  . Heart disease Mother   . Depression  Mother   . Anxiety disorder Mother   . Arrhythmia Mother   . Heart disease Maternal Grandfather   . Diabetes Paternal Grandmother     ROS: Review of Systems  Constitutional: Negative for fever and weight loss.  Gastrointestinal: Positive for abdominal pain. Negative for melena and vomiting.       Negative hematochezia  Musculoskeletal: Negative for myalgias.    PHYSICAL EXAM: Blood  pressure 105/64, pulse (!) 58, temperature 98 F (36.7 C), temperature source Oral, height 6\' 3"  (1.905 m), weight (!) 454 lb (205.9 kg), SpO2 95 %. Body mass index is 56.75 kg/m.   General: Cooperative, alert, well developed, in no acute distress. HEENT: Conjunctivae and lids unremarkable. Neck: No thyromegaly.  Cardiovascular: Regular rhythm.  Lungs: Normal work of breathing. Extremities: No edema.  Neurologic: No focal deficits.   RECENT LABS AND TESTS: BMET    Component Value Date/Time   NA 138 03/28/2019 0820   K 4.4 03/28/2019 0820   CL 100 03/28/2019 0820   CO2 24 03/28/2019 0820   GLUCOSE 174 (H) 03/28/2019 0820   GLUCOSE 88 01/11/2014 1302   BUN 9 03/28/2019 0820   CREATININE 0.79 03/28/2019 0820   CREATININE 0.78 07/27/2012 1330   CALCIUM 8.4 (L) 03/28/2019 0820   GFRNONAA 115 03/28/2019 0820   GFRAA 133 03/28/2019 0820   Lab Results  Component Value Date   HGBA1C 7.8 (H) 03/28/2019   HGBA1C 7.0 (H) 12/13/2017   HGBA1C  03/19/2009    5.9 (NOTE) The ADA recommends the following therapeutic goal for glycemic control related to Hgb A1c measurement: Goal of therapy: <6.5 Hgb A1c  Reference: American Diabetes Association: Clinical Practice Recommendations 2010, Diabetes Care, 2010, 33: (Suppl  1).   Lab Results  Component Value Date   INSULIN 99.3 (H) 03/28/2019   CBC    Component Value Date/Time   WBC 3.8 03/28/2019 0820   WBC 4.0 01/11/2014 1302   RBC 4.51 03/28/2019 0820   RBC 4.76 01/11/2014 1302   HGB 12.2 (L) 03/28/2019 0820   HCT 37.7 03/28/2019 0820   PLT 262  03/28/2019 0820   MCV 84 03/28/2019 0820   MCH 27.1 03/28/2019 0820   MCH 27.1 12/29/2013 0244   MCHC 32.4 03/28/2019 0820   MCHC 32.8 01/11/2014 1302   RDW 14.5 03/28/2019 0820   LYMPHSABS 1.2 03/28/2019 0820   MONOABS 0.4 01/11/2014 1302   EOSABS 0.2 03/28/2019 0820   BASOSABS 0.0 03/28/2019 0820   Iron/TIBC/Ferritin/ %Sat    Component Value Date/Time   IRON 44 03/22/2009 0440   TIBC 278 03/22/2009 0440   FERRITIN 110 03/22/2009 0440   IRONPCTSAT 16 (L) 03/22/2009 0440   Lipid Panel     Component Value Date/Time   CHOL 122 03/28/2019 0820   TRIG 81 03/28/2019 0820   HDL 36 (L) 03/28/2019 0820   CHOLHDL 3.2 08/03/2018 1528   CHOLHDL 5.3 03/20/2009 0410   VLDL 22 03/20/2009 0410   LDLCALC 70 03/28/2019 0820   LDLDIRECT 52 03/26/2015 0824   Hepatic Function Panel     Component Value Date/Time   PROT 7.4 03/28/2019 0820   ALBUMIN 4.0 03/28/2019 0820   AST 18 03/28/2019 0820   ALT 39 03/28/2019 0820   ALKPHOS 88 03/28/2019 0820   BILITOT 0.7 03/28/2019 0820   BILIDIR 0.16 04/23/2015 0833   IBILI 1.2 (H) 03/24/2009 0320      Component Value Date/Time   TSH 2.530 03/28/2019 0820   TSH 1.670 01/22/2015 1114   TSH 2.460 12/27/2013 0603     OBESITY BEHAVIORAL INTERVENTION VISIT DOCUMENTATION FOR INSURANCE (~15 minutes)   ASK: We discussed the diagnosis of obesity with Danny Hinton today and Danny agreed to give [place] permission to discuss obesity behavioral modification therapy today.  ASSESS: Danny has the diagnosis of obesity and his BMI today is 55.75 Danny is in the action stage of change   ADVISE: Danny  was educated on the multiple health risks of obesity as well as the benefit of weight loss to improve his health. He was advised of the need for long term treatment and the importance of lifestyle modifications to improve his current health and to decrease his risk of future health problems.  AGREE: Multiple dietary modification options and treatment options were  discussed and  Danny Hinton agreed to follow the recommendations documented in the above note.  ARRANGE: Danny Hinton was educated on the importance of frequent visits to treat obesity as outlined per CMS and USPSTF guidelines and agreed to schedule his next follow up appointment today.   Trude Mcburney, am acting as transcriptionist for Helane Rima, DO  I have reviewed the above documentation for accuracy and completeness, and I agree with the above. Helane Rima, DO

## 2019-05-07 ENCOUNTER — Encounter: Payer: Self-pay | Admitting: Family Medicine

## 2019-05-17 ENCOUNTER — Other Ambulatory Visit: Payer: Medicaid Other

## 2019-05-18 ENCOUNTER — Ambulatory Visit: Payer: Medicaid Other | Admitting: Gastroenterology

## 2019-05-21 ENCOUNTER — Other Ambulatory Visit: Payer: Medicaid Other

## 2019-05-23 ENCOUNTER — Encounter (INDEPENDENT_AMBULATORY_CARE_PROVIDER_SITE_OTHER): Payer: Self-pay | Admitting: Family Medicine

## 2019-05-23 ENCOUNTER — Ambulatory Visit (INDEPENDENT_AMBULATORY_CARE_PROVIDER_SITE_OTHER): Payer: Medicaid Other | Admitting: Family Medicine

## 2019-05-23 ENCOUNTER — Other Ambulatory Visit: Payer: Self-pay

## 2019-05-23 VITALS — BP 101/65 | HR 63 | Temp 97.7°F | Ht 75.0 in | Wt >= 6400 oz

## 2019-05-23 DIAGNOSIS — G4733 Obstructive sleep apnea (adult) (pediatric): Secondary | ICD-10-CM

## 2019-05-23 DIAGNOSIS — R109 Unspecified abdominal pain: Secondary | ICD-10-CM | POA: Diagnosis not present

## 2019-05-23 DIAGNOSIS — Z5181 Encounter for therapeutic drug level monitoring: Secondary | ICD-10-CM | POA: Diagnosis not present

## 2019-05-23 DIAGNOSIS — Z6841 Body Mass Index (BMI) 40.0 and over, adult: Secondary | ICD-10-CM | POA: Diagnosis not present

## 2019-05-23 DIAGNOSIS — E1169 Type 2 diabetes mellitus with other specified complication: Secondary | ICD-10-CM

## 2019-05-23 DIAGNOSIS — I509 Heart failure, unspecified: Secondary | ICD-10-CM | POA: Diagnosis not present

## 2019-05-23 DIAGNOSIS — L84 Corns and callosities: Secondary | ICD-10-CM

## 2019-05-23 DIAGNOSIS — E66813 Obesity, class 3: Secondary | ICD-10-CM

## 2019-05-23 DIAGNOSIS — Z7901 Long term (current) use of anticoagulants: Secondary | ICD-10-CM

## 2019-05-23 DIAGNOSIS — G8929 Other chronic pain: Secondary | ICD-10-CM

## 2019-05-23 NOTE — Progress Notes (Signed)
Chief Complaint:   OBESITY Danny Hinton is here to discuss his progress with his obesity treatment plan along with follow-up of his obesity related diagnoses. Danny is on the Category 4 Plan and states he is following his eating plan approximately 50% of the time. Danny states he is walking for 10 minutes 3-4 times per week.  Today's visit was #: 4 Starting weight: 452 lbs Starting date: 03/28/2019 Today's weight: 450 lbs Today's date: 05/23/2019 Total lbs lost to date: 2 lbs Total lbs lost since last in-office visit: 4 lbs  Interim History:   Subjective:   1. Type 2 diabetes mellitus with other specified complication, without long-term current use of insulin ([place]), A1c = 7.8 (03/28/19) Danny is compliant with his medications.  He is complaining of pain in his lateral right foot.  It is tender to touch.  No injury.  He is unable to exercise much due to pain.  2. OSA treated with BiPAP He is compliant with BiPAP but waking more with apneic episodes.  When started BiPAP last year he noticed that he was sleeping better.  A new mask and filter should come this week.  3. Congestive heart failure, unspecified HF chronicity, unspecified heart failure type ([place]) Danny states he has been out of his Entresto for 3-4 weeks.    4. Chronic abdominal pain He had to reschedule his CT scan because it is still waiting for the prior authorization.  He has a GI appointment.  5. Encounter for monitoring Coumadin therapy Danny Hinton's Coumadin has not been checked since November.  Assessment/Plan:   1. Type 2 diabetes mellitus with other specified complication, without long-term current use of insulin ([place]), A1c = 7.8 (03/28/19) Will place a referral to podiatry for a right foot callous with diabetes mellitus.  Orders - Ambulatory referral to Podiatry  2. OSA treated with BiPAP Patient does not feel that this has been controlled lately. He does have new supplies on the way.  3. Congestive heart  failure, unspecified HF chronicity, unspecified heart failure type [facility]) Will send message to cardiology regarding patient's Entresto.   4. Chronic abdominal pain Will follow-up immediately on previously ordered CT abd/pelvis.  5. Encounter for monitoring Coumadin therapy Will order Coumadin labs today.  Orders - INR/PT  6. Class 3 severe obesity with serious comorbidity and body mass index (BMI) of 50.0 to 59.9 in adult, unspecified obesity type ([place]) Danny is currently in the action stage of change. As such, his goal is to continue with weight loss efforts. He has agreed to continue on the Category 4 Plan.   We discussed the following exercise goals today: For substantial health benefits, adults should do at least 150 minutes (2 hours and 30 minutes) a week of moderate-intensity, or 75 minutes (1 hour and 15 minutes) a week of vigorous-intensity aerobic physical activity, or an equivalent combination of moderate- and vigorous-intensity aerobic activity. Aerobic activity should be performed in episodes of at least 10 minutes, and preferably, it should be spread throughout the week. Adults should also include muscle-strengthening activities that involve all major muscle groups on 2 or more days a week.  We discussed the following behavioral modification strategies today: increasing lean protein intake, decreasing simple carbohydrates, increasing vegetables and increasing water intake.  Danny Hinton has agreed to follow-up with our clinic in 2 weeks. He was informed of the importance of frequent follow-up visits to maximize his success with intensive lifestyle modifications for his multiple health conditions.  Danny Hinton was informed we would discuss his lab results at his next visit unless there is a critical issue that needs to be addressed sooner. Danny Hinton agreed to keep his next visit at the agreed upon time to discuss these results.  Objective:   Blood pressure 101/65, pulse 63, temperature 97.7 F  (36.5 C), temperature source Oral, height 6\' 3"  (1.905 m), weight (!) 450 lb (204.1 kg), SpO2 99 %. Body mass index is 56.25 kg/m.  General: Cooperative, alert, well developed, in no acute distress. HEENT: Conjunctivae and lids unremarkable. Neck: No thyromegaly.  Cardiovascular: Regular rhythm.  Lungs: Normal work of breathing. Extremities: Right lateral foot with callous.  Neurologic: No focal deficits.   Lab Results  Component Value Date   CREATININE 0.79 03/28/2019   BUN 9 03/28/2019   NA 138 03/28/2019   K 4.4 03/28/2019   CL 100 03/28/2019   CO2 24 03/28/2019   Lab Results  Component Value Date   ALT 39 03/28/2019   AST 18 03/28/2019   ALKPHOS 88 03/28/2019   BILITOT 0.7 03/28/2019   Lab Results  Component Value Date   HGBA1C 7.8 (H) 03/28/2019   HGBA1C 7.0 (H) 12/13/2017   HGBA1C  03/19/2009    5.9 (NOTE) The ADA recommends the following therapeutic goal for glycemic control related to Hgb A1c measurement: Goal of therapy: <6.5 Hgb A1c  Reference: American Diabetes Association: Clinical Practice Recommendations 2010, Diabetes Care, 2010, 33: (Suppl  1).   Lab Results  Component Value Date   INSULIN 99.3 (H) 03/28/2019   Lab Results  Component Value Date   TSH 2.530 03/28/2019   Lab Results  Component Value Date   CHOL 122 03/28/2019   HDL 36 (L) 03/28/2019   LDLCALC 70 03/28/2019   LDLDIRECT 52 03/26/2015   TRIG 81 03/28/2019   CHOLHDL 3.2 08/03/2018   Lab Results  Component Value Date   WBC 3.8 03/28/2019   HGB 12.2 (L) 03/28/2019   HCT 37.7 03/28/2019   MCV 84 03/28/2019   PLT 262 03/28/2019   Lab Results  Component Value Date   IRON 44 03/22/2009   TIBC 278 03/22/2009   FERRITIN 110 03/22/2009   Obesity Behavioral Intervention:   Approximately 15 minutes were spent on the discussion below.  ASK: We discussed the diagnosis of obesity with 03/24/2009 E Bouse today and Danny Hinton agreed to give Danny Hinton permission to discuss obesity behavioral  modification therapy today.  ASSESS: [place] has the diagnosis of obesity and his BMI today is 56.3. Danny Hinton is in the action stage of change.   ADVISE: Danny Hinton was educated on the multiple health risks of obesity as well as the benefit of weight loss to improve his health. He was advised of the need for long term treatment and the importance of lifestyle modifications to improve his current health and to decrease his risk of future health problems.  AGREE: Multiple dietary modification options and treatment options were discussed and Danny Hinton agreed to follow the recommendations documented in the above note.  ARRANGE: Danny Hinton was educated on the importance of frequent visits to treat obesity as outlined per CMS and USPSTF guidelines and agreed to schedule his next follow up appointment today.  Attestation Statements:   Reviewed by clinician on day of visit: allergies, medications, problem list, medical history, surgical history, family history, social history, and previous encounter notes.  I, Danny Hinton, CMA, am acting as Insurance claims handler for Energy manager, DO.  I have reviewed the above documentation for accuracy and  completeness, and I agree with the above. Briscoe Deutscher, DO

## 2019-05-24 LAB — PROTIME-INR
INR: 2.1 — ABNORMAL HIGH (ref 0.9–1.2)
Prothrombin Time: 21.7 s — ABNORMAL HIGH (ref 9.1–12.0)

## 2019-05-25 ENCOUNTER — Other Ambulatory Visit: Payer: Self-pay | Admitting: *Deleted

## 2019-05-25 ENCOUNTER — Encounter: Payer: Self-pay | Admitting: Family Medicine

## 2019-05-28 ENCOUNTER — Other Ambulatory Visit: Payer: Self-pay | Admitting: *Deleted

## 2019-05-28 MED ORDER — ENTRESTO 49-51 MG PO TABS: 1.0000 | ORAL_TABLET | Freq: Two times a day (BID) | ORAL | 1 refills | Status: DC

## 2019-05-29 ENCOUNTER — Ambulatory Visit: Payer: Medicaid Other | Admitting: Podiatry

## 2019-05-30 ENCOUNTER — Telehealth: Payer: Self-pay | Admitting: *Deleted

## 2019-05-30 NOTE — Telephone Encounter (Signed)
Prior Authorization for Sherryll Burger has been completed through [employer]: [id number]

## 2019-05-31 NOTE — Telephone Encounter (Signed)
The PA has been approved through 11/26/2019

## 2019-06-06 DIAGNOSIS — G4733 Obstructive sleep apnea (adult) (pediatric): Secondary | ICD-10-CM | POA: Diagnosis not present

## 2019-06-11 ENCOUNTER — Ambulatory Visit (INDEPENDENT_AMBULATORY_CARE_PROVIDER_SITE_OTHER): Payer: Medicaid Other | Admitting: Family Medicine

## 2019-06-14 ENCOUNTER — Other Ambulatory Visit: Payer: Self-pay | Admitting: *Deleted

## 2019-06-14 MED ORDER — LORATADINE 10 MG PO TABS: 10.0000 mg | ORAL_TABLET | Freq: Every day | ORAL | 0 refills | Status: DC

## 2019-06-18 ENCOUNTER — Ambulatory Visit (INDEPENDENT_AMBULATORY_CARE_PROVIDER_SITE_OTHER): Payer: Medicaid Other | Admitting: Gastroenterology

## 2019-06-18 ENCOUNTER — Other Ambulatory Visit: Payer: Self-pay

## 2019-06-18 ENCOUNTER — Encounter: Payer: Self-pay | Admitting: Gastroenterology

## 2019-06-18 ENCOUNTER — Other Ambulatory Visit (INDEPENDENT_AMBULATORY_CARE_PROVIDER_SITE_OTHER): Payer: Medicaid Other

## 2019-06-18 VITALS — BP 100/60 | HR 65 | Temp 98.2°F | Ht 75.0 in | Wt >= 6400 oz

## 2019-06-18 DIAGNOSIS — R194 Change in bowel habit: Secondary | ICD-10-CM

## 2019-06-18 DIAGNOSIS — R140 Abdominal distension (gaseous): Secondary | ICD-10-CM

## 2019-06-18 DIAGNOSIS — D649 Anemia, unspecified: Secondary | ICD-10-CM

## 2019-06-18 DIAGNOSIS — Z7901 Long term (current) use of anticoagulants: Secondary | ICD-10-CM | POA: Diagnosis not present

## 2019-06-18 LAB — CBC WITH DIFFERENTIAL/PLATELET
Basophils Absolute: 0 10*3/uL (ref 0.0–0.1)
Basophils Relative: 0.7 % (ref 0.0–3.0)
Eosinophils Absolute: 0.2 10*3/uL (ref 0.0–0.7)
Eosinophils Relative: 4.2 % (ref 0.0–5.0)
HCT: 36.8 % — ABNORMAL LOW (ref 39.0–52.0)
Hemoglobin: 12.2 g/dL — ABNORMAL LOW (ref 13.0–17.0)
Lymphocytes Relative: 35.1 % (ref 12.0–46.0)
Lymphs Abs: 1.5 10*3/uL (ref 0.7–4.0)
MCHC: 33.2 g/dL (ref 30.0–36.0)
MCV: 82.2 fl (ref 78.0–100.0)
Monocytes Absolute: 0.4 10*3/uL (ref 0.1–1.0)
Monocytes Relative: 10.2 % (ref 3.0–12.0)
Neutro Abs: 2.1 10*3/uL (ref 1.4–7.7)
Neutrophils Relative %: 49.8 % (ref 43.0–77.0)
Platelets: 239 10*3/uL (ref 150.0–400.0)
RBC: 4.48 Mil/uL (ref 4.22–5.81)
RDW: 15.3 % (ref 11.5–15.5)
WBC: 4.2 10*3/uL (ref 4.0–10.5)

## 2019-06-18 LAB — IBC + FERRITIN
Ferritin: 128.5 ng/mL (ref 22.0–322.0)
Iron: 58 ug/dL (ref 42–165)
Saturation Ratios: 20.3 % (ref 20.0–50.0)
Transferrin: 204 mg/dL — ABNORMAL LOW (ref 212.0–360.0)

## 2019-06-18 MED ORDER — LOPERAMIDE HCL 2 MG PO TABS: ORAL_TABLET | ORAL | 0 refills | Status: DC

## 2019-06-18 MED ORDER — VSL#3 PO CAPS: ORAL_CAPSULE | ORAL | Status: AC

## 2019-06-18 NOTE — Progress Notes (Signed)
HPI :  39 y/o male with history of CHF, atrial fib/flutter, pacemaker in place, defibrillator in place, anticoagulated with Coumadin, history of diabetes, morbid obesity with BMI 57, OSA, referred by Helane Rima to for altered bowel habits.   The patient states he has had ongoing bowel symptoms for more than 10 years.  He states he has urgency to use the bathroom often after he eats something.  Stool form can alter, often loose sometimes constipated.  He states looser stools predominate more so than constipation.  He has occasional gas pains and has a bloated abdomen which distends him at times in his lower abdomen after he eats.  Having a bowel movement can help reduce the symptoms.  He denies any blood in his stools.  He had one occasion where he had a small amount of blood on the toilet paper with wiping last month, that is the only time it ever happened and has not happened since.  He has no family history of colon cancer.  No family history of Crohn's disease or colitis.  He was diagnosed with diabetes relatively recently, they are thinking about using Victoza to treat his diabetes however given potential side effects they are not sure about his tolerance of it. He still has his gallbladder.  On review of his chart he has chronic normocytic anemia, last hemoglobin of 12.2 with an MCV of 84.  He was found to have mild B12 deficiency recently.  Has since been supplemented with B12.  On review of labs he has had anemia dating back to 2014.  No prior colonoscopy.  Otherwise he states that he feels okay without any cardiopulmonary symptoms.  No new changes in his medicines that affect his bowel function otherwise that he is aware of.  CT scan of the abdomen pelvis and chest in March 2020 did not show any abnormalities of his bowel.  Echo 01/28/2018 - EF 40%  Past Medical History:  Diagnosis Date  . Anxiety   . Atypical atrial flutter (HCC) 08/19/2014   Pace terminated in device clinic.  AFL cycle  length was 390 msec.  Marland Kitchen Automatic implantable cardioverter-defibrillator in situ    a. St Jude in 2011.  . B12 nutritional deficiency, B12 = 214 (03/18/19) 04/29/2019  . Chronic anticoagulation with Coumadin   . Chronic systolic congestive heart failure (HCC)    a. suspected NICM - dx 2010. EF 15% by TEE, 10-20% by echo at that time. b. s/p St. Jude AICD 2011. c. Echo 01/2010: mod dilated LV, EF 30%, mod aortic root dilitation, no significant valvular disease.  . Depression   . Diabetes mellitus (HCC), Rx Victoza 04/29/2019  . Dyslipidemia associated with type 2 diabetes mellitus (HCC), Rx Lipitor 04/29/2019  . Eczema   . Enlarged aorta (HCC)   . Hypertension associated with diabetes (HCC)   . Mobitz type 2 second degree atrioventricular block    a. During sleeping hours in 2010 suspected due to OSA.  . Morbid obesity (HCC)   . Nonischemic cardiomyopathy (HCC)   . OSA treated with BiPAP 09/04/2018   Severe obstructive sleep apnea with an AHI of 67.2/h and nocturnal hypoxemia with oxygen saturations as low as 82%. Now on BIPAP at 18/14 cm H2O.  Marland Kitchen PAF (paroxysmal atrial fibrillation) (HCC)   . Palpitations   . Paroxysmal atrial flutter (HCC), s/p ablation   . Seasonal allergies   . Sleep apnea   . Thoracic aortic aneurysm (HCC), monitored annually 04/29/2019  . Vitamin D  deficiency, vitamin D = 6.9 (03/28/19) 04/29/2019     Past Surgical History:  Procedure Laterality Date  . ATRIAL FLUTTER ABLATION N/A 01/18/2014   Procedure: ATRIAL FLUTTER ABLATION;  Surgeon: Deboraha Sprang, MD;  Location: [facility] CATH LAB;  Service: Cardiovascular;  Laterality: N/A;  . CARDIAC DEFIBRILLATOR PLACEMENT  02/17/10   St. Jude Medical 45DR, model number [id number], serial number [id number]  . CARDIOVERSION N/A 12/29/2013   Procedure: CARDIOVERSION;  Surgeon: Deboraha Sprang, MD;  Location: [facility];  Service: Cardiovascular;  Laterality: N/A;  . TOOTH EXTRACTION N/A 10/23/2012   Procedure: EXTRACTION TEETH 1, 16, 17,  30, 31;  Surgeon: Gae Bon, DDS;  Location: [facility];  Service: Oral Surgery;  Laterality: N/A;   Family History  Problem Relation Age of Onset  . Hodgkin's lymphoma Brother 25  . Heart disease Mother   . Depression Mother   . Anxiety disorder Mother   . Arrhythmia Mother   . Colon polyps Father   . Heart disease Maternal Grandfather   . Diabetes Paternal Grandmother   . Irritable bowel syndrome Maternal Aunt        2 or 3 mat aunts have IBS   Social History   Tobacco Use  . Smoking status: Never Smoker  . Smokeless tobacco: Never Used  Substance Use Topics  . Alcohol use: No  . Drug use: No   Current Outpatient Medications  Medication Sig Dispense Refill  . ALPRAZolam (XANAX) 1 MG tablet Take 1 tablet (1 mg total) by mouth 3 (three) times daily as needed for anxiety. 30 tablet 0  . atorvastatin (LIPITOR) 40 MG tablet Take 1 tablet (40 mg total) by mouth daily with breakfast. (Needs visit with PCP) 90 tablet 3  . buPROPion (WELLBUTRIN XL) 150 MG 24 hr tablet Take 1 tablet (150 mg total) by mouth daily. 90 tablet 3  . carvedilol (COREG) 25 MG tablet Take 1.5 tablets (37.5 mg total) by mouth 2 (two) times daily with a meal. 270 tablet 3  . digoxin (LANOXIN) 0.125 MG tablet Take 1 tablet (125 mcg total) by mouth every other day. 45 tablet 2  . fluticasone (FLONASE) 50 MCG/ACT nasal spray Place 1 spray into both nostrils as needed. 16 g 11  . furosemide (LASIX) 20 MG tablet Take 1 tablet (20 mg total) by mouth every other day. 15 tablet 5  . Insulin Pen Needle (BD PEN NEEDLE NANO 2ND GEN) 32G X 4 MM MISC 1 Package by Does not apply route 2 (two) times daily. 100 each 0  . liraglutide (VICTOZA) 18 MG/3ML SOPN Inject 0.1 mLs (0.6 mg total) into the skin every morning. 1 pen 0  . loratadine (CLARITIN) 10 MG tablet Take 1 tablet (10 mg total) by mouth daily. 30 tablet 0  . metoprolol tartrate (LOPRESSOR) 25 MG tablet Take 1 tablet as needed for palpitations 90 tablet 3  .  sacubitril-valsartan (ENTRESTO) 49-51 MG Take 1 tablet by mouth 2 (two) times daily. 180 tablet 1  . sertraline (ZOLOFT) 100 MG tablet Take 1 tablet (100 mg total) by mouth at bedtime. 30 tablet 11  . Vitamin D, Ergocalciferol, (DRISDOL) 1.25 MG (50000 UT) CAPS capsule Take 1 capsule (50,000 Units total) by mouth every 7 (seven) days. 4 capsule 0  . warfarin (COUMADIN) 5 MG tablet TAKE 1 TABLET BY MOUTH ONCE DAILY EXCEPT  TAKE  1  &  1/2  TABLET  EACH  FRIDAY  OR  AS  DIRECTED  BY  ANTICOAGULATION  CLINIC 100 tablet 3   No current facility-administered medications for this visit.   No Known Allergies   Review of Systems: All systems reviewed and negative except where noted in HPI.   CBC Latest Ref Rng & Units 06/18/2019 03/28/2019 08/03/2018  WBC 4.0 - 10.5 K/uL 4.2 3.8 4.7  Hemoglobin 13.0 - 17.0 g/dL 12.2(L) 12.2(L) 12.5(L)  Hematocrit 39.0 - 52.0 % 36.8(L) 37.7 37.6  Platelets 150.0 - 400.0 K/uL 239.0 262 252    Lab Results  Component Value Date   CREATININE 0.79 03/28/2019   BUN 9 03/28/2019   NA 138 03/28/2019   K 4.4 03/28/2019   CL 100 03/28/2019   CO2 24 03/28/2019    Lab Results  Component Value Date   ALT 39 03/28/2019   AST 18 03/28/2019   ALKPHOS 88 03/28/2019   BILITOT 0.7 03/28/2019      Physical Exam: BP 100/60 Comment: large cuff  Pulse 65   Temp 98.2 F (36.8 C)   Ht 6\' 3"  (1.905 m)   Wt (!) 460 lb (208.7 kg)   BMI 57.50 kg/m  Constitutional: Pleasant,well-developed, male in no acute distress. HEENT: Normocephalic and atraumatic. Conjunctivae are normal. No scleral icterus. Neck supple.  Cardiovascular: Normal rate, regular rhythm.  Pulmonary/chest: Effort normal and breath sounds normal. No wheezing, rales or rhonchi. Abdominal: Soft, protuberant, nontender. . There are no masses palpable. No hepatomegaly. Extremities: no edema Lymphadenopathy: No cervical adenopathy noted. Neurological: Alert and oriented to person place and time. Skin: Skin  is warm and dry. No rashes noted. Psychiatric: Normal mood and affect. Behavior is normal.   ASSESSMENT AND PLAN: 39 year old male here for new patient assessment of the following:  Altered bowel habits / bloating - longstanding symptoms for more than 10 years, severity can fluctuate.  Ongoing postprandial loose more than constipated stools with significant gas and bloating.  No alarm symptoms.  We discussed differential diagnosis.  Suspect he probably has IBS/functional disorder causing the symptoms given stability and duration of symptoms over the years.  He is also had a prior CT scan of the abdomen pelvis with contrast in March 2020 which did not show any concerning pathology, I think Crohn's/colitis is probably less likely.  I discussed options with him.  I counseled him on a low FODMAP diet and would like him to try this to see if this will help reduce his stool frequency and his bloating.  I will also try him on a probiotic twice a day for the next few weeks and see if this helps.  We will try VSL #3.  He can use Imodium as needed as well as this helps to slow him down, particularly when he is leaving the house.  I like him to touch base with me a few weeks and let me know how he is doing.  If no improvement may consider a trial of rifaximin given his considerable component of bloating, or consider trial of colestid.  I do not feel strongly that he warrants a colonoscopy right now, however if his symptoms are refractory to medical therapy and persist we may consider it.  He is higher than average risk for anesthesia and his case would need to be done at the hospital given his morbid obesity and cardiac history.  He agreed with the plan, he will touch base with me in a few weeks for an update on the new regimen.  Anemia - chronic intermittent mild normocytic anemia.  He actually was noted to  have a slight B12 deficiency recently, now being supplemented.  I will recheck his CBC and check his iron  stores to ensure normal.  If he has an iron deficiency then he will warrant an endoscopic evaluation for that.  He agreed, let him know the results of the labs.  .I spent 45 minutes of time, including in depth chart review, independent review of results as outlined above, communicating results with the patient directly, face-to-face time with the patient, coordinating care, ordering studies and medications as appropriate, and documenting this encounter.   Ileene Patrick, MD [facility]  CC: Helane Rima, DO

## 2019-06-18 NOTE — Patient Instructions (Addendum)
If you are age 38 or older, your body mass index should be between 23-30. Your Body mass index is 57.5 kg/m. If this is out of the aforementioned range listed, please consider follow up with your Primary Care Provider.  If you are age 61 or younger, your body mass index should be between 19-25. Your Body mass index is 57.5 kg/m. If this is out of the aformentioned range listed, please consider follow up with your Primary Care Provider.   Please go to the lab in the basement of our building to have lab work done as you leave today. Hit "B" for basement when you get on the elevator.  When the doors open the lab is on your left.  We will call you with the results. Thank you.  We are giving you a Low FOD-MAP diet to follow today.  Please start taking a probiotic twice a day.  We recommend VSL#3 112.5. You may be able to find it at [facility] on [address] [employer] or [employer].  You may be able to order it online at [email or web address]. Otherwise you can use Florastor probiotic.  Use Imodium, over-the-counter as needed.  Please call back in 2 weeks with an update on how you're doing.  Thank you for entrusting me with your care and for choosing [facility], Dr. Ileene Patrick     Due to recent changes in healthcare laws, you may see the results of your imaging and laboratory studies on MyChart before your provider has had a chance to review them.  We understand that in some cases there may be results that are confusing or concerning to you. Not all laboratory results come back in the same time frame and the provider may be waiting for multiple results in order to interpret others.  Please give [place] 48 hours in order for your provider to thoroughly review all the results before contacting the office for clarification of your results.

## 2019-06-19 ENCOUNTER — Ambulatory Visit (INDEPENDENT_AMBULATORY_CARE_PROVIDER_SITE_OTHER): Payer: Medicaid Other | Admitting: *Deleted

## 2019-06-19 DIAGNOSIS — I5022 Chronic systolic (congestive) heart failure: Secondary | ICD-10-CM

## 2019-06-20 LAB — CUP PACEART REMOTE DEVICE CHECK
Battery Remaining Longevity: 11 mo
Battery Remaining Percentage: 10 %
Battery Voltage: 2.68 V
Brady Statistic AP VP Percent: 1 %
Brady Statistic AP VS Percent: 1 %
Brady Statistic AS VP Percent: 1 %
Brady Statistic AS VS Percent: 99 %
Brady Statistic RA Percent Paced: 1 %
Brady Statistic RV Percent Paced: 1 %
Date Time Interrogation Session: 20210209162334
HighPow Impedance: 46 Ohm
Implantable Lead Implant Date: 20111010
Implantable Lead Implant Date: 20111010
Implantable Lead Location: 753859
Implantable Lead Location: 753860
Implantable Pulse Generator Implant Date: 20111010
Lead Channel Impedance Value: 430 Ohm
Lead Channel Impedance Value: 460 Ohm
Lead Channel Pacing Threshold Amplitude: 0.75 V
Lead Channel Pacing Threshold Amplitude: 1.75 V
Lead Channel Pacing Threshold Pulse Width: 0.5 ms
Lead Channel Pacing Threshold Pulse Width: 0.5 ms
Lead Channel Sensing Intrinsic Amplitude: 11.7 mV
Lead Channel Sensing Intrinsic Amplitude: 4.7 mV
Lead Channel Setting Pacing Amplitude: 2.5 V
Lead Channel Setting Pacing Amplitude: 3.5 V
Lead Channel Setting Pacing Pulse Width: 0.5 ms
Lead Channel Setting Sensing Sensitivity: 0.5 mV
Pulse Gen Serial Number: 621444

## 2019-06-21 ENCOUNTER — Ambulatory Visit (INDEPENDENT_AMBULATORY_CARE_PROVIDER_SITE_OTHER): Payer: Medicaid Other | Admitting: Family Medicine

## 2019-06-21 ENCOUNTER — Encounter (INDEPENDENT_AMBULATORY_CARE_PROVIDER_SITE_OTHER): Payer: Self-pay | Admitting: Family Medicine

## 2019-06-21 ENCOUNTER — Other Ambulatory Visit: Payer: Self-pay

## 2019-06-21 VITALS — BP 91/60 | HR 65 | Temp 97.9°F | Ht 75.0 in | Wt >= 6400 oz

## 2019-06-21 DIAGNOSIS — E119 Type 2 diabetes mellitus without complications: Secondary | ICD-10-CM | POA: Diagnosis not present

## 2019-06-21 DIAGNOSIS — R109 Unspecified abdominal pain: Secondary | ICD-10-CM

## 2019-06-21 DIAGNOSIS — G4733 Obstructive sleep apnea (adult) (pediatric): Secondary | ICD-10-CM

## 2019-06-21 DIAGNOSIS — Z6841 Body Mass Index (BMI) 40.0 and over, adult: Secondary | ICD-10-CM | POA: Diagnosis not present

## 2019-06-21 DIAGNOSIS — G8929 Other chronic pain: Secondary | ICD-10-CM | POA: Diagnosis not present

## 2019-06-21 NOTE — Progress Notes (Signed)
Chief Complaint:   OBESITY Danny Hinton is here to discuss his progress with his obesity treatment plan along with follow-up of his obesity related diagnoses. Danny Hinton is on the Category 4 Plan and states he is following his eating plan approximately 40% of the time. Danny Hinton states he is walking for 30 minutes 3 times per week.  Today's visit was #: 5 Starting weight: 452 lbs Starting date: 03/28/2019 Today's weight: 448 lbs Today's date: 06/21/2019 Total lbs lost to date: 4 lbs Total lbs lost since last in-office visit: 2 lbs  Interim History: Danny Hinton says that he missed his podiatry appointment.  He would like to pursue bariatric surgery.   Subjective:   1. Type 2 diabetes mellitus without complication, without long-term current use of insulin ([place]) Danny Hinton has type 2 diabetes.  He has not started Victoza.  Lab Results  Component Value Date   HGBA1C 7.8 (H) 03/28/2019   HGBA1C 7.0 (H) 12/13/2017   HGBA1C  03/19/2009    5.9 (NOTE) The ADA recommends the following therapeutic goal for glycemic control related to Hgb A1c measurement: Goal of therapy: <6.5 Hgb A1c  Reference: American Diabetes Association: Clinical Practice Recommendations 2010, Diabetes Care, 2010, 33: (Suppl  1).   Lab Results  Component Value Date   LDLCALC 70 03/28/2019   CREATININE 0.79 03/28/2019   Lab Results  Component Value Date   INSULIN 99.3 (H) 03/28/2019   2. OSA treated with BiPAP Danny Hinton has a diagnosis of sleep apnea. He reports that he is using a BiPAP regularly.   3. Chronic abdominal pain Danny Hinton has been to see GI.  I have reviewed the note with him.  Assessment/Plan:   1. Type 2 diabetes mellitus without complication, without long-term current use of insulin (HCC) Good blood sugar control is important to decrease the likelihood of diabetic complications such as nephropathy, neuropathy, limb loss, blindness, coronary artery disease, and death. Intensive lifestyle modification including diet, exercise and  weight loss are the first line of treatment for diabetes. He will start Victoza once daily and stay on 0.6 mg until his next appointment.  2. OSA treated with BiPAP Intensive lifestyle modifications are the first line treatment for this issue. We discussed several lifestyle modifications today and he will continue to work on diet, exercise and weight loss efforts. We will continue to monitor. Orders and follow up as documented in patient record.   Counseling  Sleep apnea is a condition in which breathing pauses or becomes shallow during sleep. This happens over and over during the night. This disrupts your sleep and keeps your body from getting the rest that it needs, which can cause tiredness and lack of energy (fatigue) during the day.  Sleep apnea treatment: If you were given a device to open your airway while you sleep, USE IT!  Sleep hygiene:   Limit or avoid alcohol, caffeinated beverages, and cigarettes, especially close to bedtime.   Do not eat a large meal or eat spicy foods right before bedtime. This can lead to digestive discomfort that can make it hard for you to sleep.  Keep a sleep diary to help you and your health care provider figure out what could be causing your insomnia.  . Make your bedroom a dark, comfortable place where it is easy to fall asleep. ? Put up shades or blackout curtains to block light from outside. ? Use a white noise machine to block noise. ? Keep the temperature cool. . Limit screen use before bedtime.  This includes: ? Watching TV. ? Using your smartphone, tablet, or computer. . Stick to a routine that includes going to bed and waking up at the same times every day and night. This can help you fall asleep faster. Consider making a quiet activity, such as reading, part of your nighttime routine. . Try to avoid taking naps during the day so that you sleep better at night. . Get out of bed if you are still awake after 15 minutes of trying to sleep. Keep  the lights down, but try reading or doing a quiet activity. When you feel sleepy, go back to bed.  3. Chronic abdominal pain Will monitor.  4. Class 3 severe obesity with serious comorbidity and body mass index (BMI) of 50.0 to 59.9 in adult, unspecified obesity type (HCC) [name] is currently in the action stage of change. As such, his goal is to continue with weight loss efforts. He has agreed to the Category 4 Plan.   Exercise goals: All adults should avoid inactivity. Some physical activity is better than none, and adults who participate in any amount of physical activity gain some health benefits.  Behavioral modification strategies: increasing lean protein intake and increasing water intake.  [name] has been referred to [facility] to discuss bariatric surgery.  [name] has agreed to follow-up with our clinic in 2 weeks. He was informed of the importance of frequent follow-up visits to maximize his success with intensive lifestyle modifications for his multiple health conditions.   Objective:   Blood pressure 91/60, pulse 65, temperature 97.9 F (36.6 C), temperature source Oral, height 6\' 3"  (1.905 m), weight (!) 448 lb (203.2 kg), SpO2 95 %. Body mass index is 56 kg/m.  General: Cooperative, alert, well developed, in no acute distress. HEENT: Conjunctivae and lids unremarkable. Cardiovascular: Regular rhythm.  Lungs: Normal work of breathing. Neurologic: No focal deficits.   Lab Results  Component Value Date   CREATININE 0.79 03/28/2019   BUN 9 03/28/2019   NA 138 03/28/2019   K 4.4 03/28/2019   CL 100 03/28/2019   CO2 24 03/28/2019   Lab Results  Component Value Date   ALT 39 03/28/2019   AST 18 03/28/2019   ALKPHOS 88 03/28/2019   BILITOT 0.7 03/28/2019   Lab Results  Component Value Date   HGBA1C 7.8 (H) 03/28/2019   HGBA1C 7.0 (H) 12/13/2017   HGBA1C  03/19/2009    5.9 (NOTE) The ADA recommends the following therapeutic goal for glycemic control  related to Hgb A1c measurement: Goal of therapy: <6.5 Hgb A1c  Reference: American Diabetes Association: Clinical Practice Recommendations 2010, Diabetes Care, 2010, 33: (Suppl  1).   Lab Results  Component Value Date   INSULIN 99.3 (H) 03/28/2019   Lab Results  Component Value Date   TSH 2.530 03/28/2019   Lab Results  Component Value Date   CHOL 122 03/28/2019   HDL 36 (L) 03/28/2019   LDLCALC 70 03/28/2019   LDLDIRECT 52 03/26/2015   TRIG 81 03/28/2019   CHOLHDL 3.2 08/03/2018   Lab Results  Component Value Date   WBC 4.2 06/18/2019   HGB 12.2 (L) 06/18/2019   HCT 36.8 (L) 06/18/2019   MCV 82.2 06/18/2019   PLT 239.0 06/18/2019   Lab Results  Component Value Date   IRON 58 06/18/2019   TIBC 278 03/22/2009   FERRITIN 128.5 06/18/2019   Attestation Statements:   Reviewed by clinician on day of visit: allergies, medications, problem list, medical history, surgical  history, family history, social history, and previous encounter notes.  I, Insurance claims handler, CMA, am acting as Energy manager for [employer], DO.  I have reviewed the above documentation for accuracy and completeness, and I agree with the above. Helane Rima, DO

## 2019-07-09 ENCOUNTER — Other Ambulatory Visit: Payer: Self-pay

## 2019-07-09 ENCOUNTER — Ambulatory Visit (INDEPENDENT_AMBULATORY_CARE_PROVIDER_SITE_OTHER): Payer: Medicaid Other | Admitting: Family Medicine

## 2019-07-09 ENCOUNTER — Encounter (INDEPENDENT_AMBULATORY_CARE_PROVIDER_SITE_OTHER): Payer: Self-pay | Admitting: Family Medicine

## 2019-07-09 VITALS — BP 100/60 | HR 71 | Temp 98.2°F | Ht 75.0 in | Wt >= 6400 oz

## 2019-07-09 DIAGNOSIS — R5383 Other fatigue: Secondary | ICD-10-CM | POA: Diagnosis not present

## 2019-07-09 DIAGNOSIS — G4733 Obstructive sleep apnea (adult) (pediatric): Secondary | ICD-10-CM

## 2019-07-09 DIAGNOSIS — E786 Lipoprotein deficiency: Secondary | ICD-10-CM | POA: Diagnosis not present

## 2019-07-09 DIAGNOSIS — Z6841 Body Mass Index (BMI) 40.0 and over, adult: Secondary | ICD-10-CM | POA: Diagnosis not present

## 2019-07-09 DIAGNOSIS — D6489 Other specified anemias: Secondary | ICD-10-CM | POA: Diagnosis not present

## 2019-07-09 DIAGNOSIS — E119 Type 2 diabetes mellitus without complications: Secondary | ICD-10-CM

## 2019-07-09 DIAGNOSIS — E559 Vitamin D deficiency, unspecified: Secondary | ICD-10-CM | POA: Diagnosis not present

## 2019-07-09 NOTE — Progress Notes (Signed)
Chief Complaint:   OBESITY Danny Hinton is here to discuss his progress with his obesity treatment plan along with follow-up of his obesity related diagnoses. Danny Hinton is on the Category 4 Plan and states he is following his eating plan approximately 25% of the time. Danny Hinton states he is walking for 15-30 minutes 2 times per week.  Today's visit was #: 6 Starting weight: 452 lbs Starting date: 03/28/2019 Today's weight: 449 lbs Today's date: 07/09/2019 Total lbs lost to date: 3 lbs Total lbs lost since last in-office visit: 0  Interim History: Danny Hinton was referred to Bariatric Surgery, but he has not heard anything yet.  He eats meat, baked potato with cheese (small), 45 calorie bread).  He cannot eat regular sized because they hurt his stomach.  He skips meals because he is not hungry.  He has to be home to eat because he knows that he will have to go to the bathroom right after.  Subjective:   1. Type 2 diabetes mellitus without complication, without long-term current use of insulin (HCC) Medications reviewed. He is taking Victoza 0.6 mg daily.  He had dark stool after taking Pepto.  This is not back to normal yet; it is formed, but not black.    Lab Results  Component Value Date   HGBA1C 7.8 (H) 03/28/2019   HGBA1C 7.0 (H) 12/13/2017   HGBA1C  03/19/2009    5.9 (NOTE) The ADA recommends the following therapeutic goal for glycemic control related to Hgb A1c measurement: Goal of therapy: <6.5 Hgb A1c  Reference: American Diabetes Association: Clinical Practice Recommendations 2010, Diabetes Care, 2010, 33: (Suppl  1).   Lab Results  Component Value Date   LDLCALC 70 03/28/2019   CREATININE 0.79 03/28/2019   Lab Results  Component Value Date   INSULIN 99.3 (H) 03/28/2019   2. OSA treated with BiPAP Danny Hinton has a diagnosis of sleep apnea. He reports that he is using a BiPAP regularly.   3. Anemia due to other cause, not classified Danny Hinton is not a vegetarian.  He does not have a history of  weight loss surgery.   CBC Latest Ref Rng & Units 06/18/2019 03/28/2019 08/03/2018  WBC 4.0 - 10.5 K/uL 4.2 3.8 4.7  Hemoglobin 13.0 - 17.0 g/dL 12.2(L) 12.2(L) 12.5(L)  Hematocrit 39.0 - 52.0 % 36.8(L) 37.7 37.6  Platelets 150.0 - 400.0 K/uL 239.0 262 252   Lab Results  Component Value Date   IRON 58 06/18/2019   TIBC 278 03/22/2009   FERRITIN 128.5 06/18/2019   Lab Results  Component Value Date   VITAMINB12 214 (L) 03/28/2019   4. Low HDL (under 40) Danny Hinton has hyperlipidemia and has been trying to improve his cholesterol levels with intensive lifestyle modification including a low saturated fat diet, exercise and weight loss. He denies any chest pain, claudication or myalgias.  Lab Results  Component Value Date   ALT 39 03/28/2019   AST 18 03/28/2019   ALKPHOS 88 03/28/2019   BILITOT 0.7 03/28/2019   Lab Results  Component Value Date   CHOL 122 03/28/2019   HDL 36 (L) 03/28/2019   LDLCALC 70 03/28/2019   LDLDIRECT 52 03/26/2015   TRIG 81 03/28/2019   CHOLHDL 3.2 08/03/2018   5. Vitamin D deficiency, vitamin D = 6.9 (03/28/19) Danny Hinton's Vitamin D level was 6.9 on 03/28/2019. He is currently taking vit D. He denies nausea, vomiting or muscle weakness.  6. Other fatigue Danny Hinton reports that he has been experiencing some fatigue  recently.  Assessment/Plan:   1. Type 2 diabetes mellitus without complication, without long-term current use of insulin (HCC) Good blood sugar control is important to decrease the likelihood of diabetic complications such as nephropathy, neuropathy, limb loss, blindness, coronary artery disease, and death. Intensive lifestyle modification including diet, exercise and weight loss are the first line of treatment for diabetes.   2. OSA treated with BiPAP Intensive lifestyle modifications are the first line treatment for this issue. We discussed several lifestyle modifications today and he will continue to work on diet, exercise and weight loss efforts. We  will continue to monitor. Orders and follow up as documented in patient record.   Counseling  Sleep apnea is a condition in which breathing pauses or becomes shallow during sleep. This happens over and over during the night. This disrupts your sleep and keeps your body from getting the rest that it needs, which can cause tiredness and lack of energy (fatigue) during the day.  Sleep apnea treatment: If you were given a device to open your airway while you sleep, USE IT!  Sleep hygiene:   Limit or avoid alcohol, caffeinated beverages, and cigarettes, especially close to bedtime.   Do not eat a large meal or eat spicy foods right before bedtime. This can lead to digestive discomfort that can make it hard for you to sleep.  Keep a sleep diary to help you and your health care provider figure out what could be causing your insomnia.  . Make your bedroom a dark, comfortable place where it is easy to fall asleep. ? Put up shades or blackout curtains to block light from outside. ? Use a white noise machine to block noise. ? Keep the temperature cool. . Limit screen use before bedtime. This includes: ? Watching TV. ? Using your smartphone, tablet, or computer. . Stick to a routine that includes going to bed and waking up at the same times every day and night. This can help you fall asleep faster. Consider making a quiet activity, such as reading, part of your nighttime routine. . Try to avoid taking naps during the day so that you sleep better at night. . Get out of bed if you are still awake after 15 minutes of trying to sleep. Keep the lights down, but try reading or doing a quiet activity. When you feel sleepy, go back to bed.  3. Anemia due to other cause, not classified Orders and follow up as documented in patient record.  Counseling . Iron is essential for our bodies to make red blood cells.  Reasons that someone may be deficient include: an iron-deficient diet (more likely in those  following vegan or vegetarian diets), women with heavy menses, patients with GI disorders or poor absorption, patients that have had bariatric surgery, frequent blood donors, patients with cancer, and patients with heart disease.   [place] foods include dark leafy greens, red and white meats, eggs, seafood, and beans.   . Certain foods and drinks prevent your body from absorbing iron properly. Avoid eating these foods in the same meal as iron-rich foods or with iron supplements. These foods include: coffee, black tea, and red wine; milk, dairy products, and foods that are high in calcium; beans and soybeans; whole grains.  . Constipation can be a side effect of iron supplementation. Increased water and fiber intake are helpful. Water goal: > 2 liters/day. Fiber goal: > 25 grams/day.  4. Low HDL (under 40) Cardiovascular risk and specific lipid/LDL goals reviewed.  We discussed several lifestyle modifications today and [name] will continue to work on diet, exercise and weight loss efforts. Orders and follow up as documented in patient record.   Counseling Intensive lifestyle modifications are the first line treatment for this issue. . Dietary changes: Increase soluble fiber. Decrease simple carbohydrates. . Exercise changes: Moderate to vigorous-intensity aerobic activity 150 minutes per week if tolerated. . Lipid-lowering medications: see documented in medical record.  5. Vitamin D deficiency, vitamin D = 6.9 (03/28/19) Low Vitamin D level contributes to fatigue and are associated with obesity, breast, and colon cancer. He agrees to continue to take prescription Vitamin D @50 ,000 IU every week and will follow-up for routine testing of Vitamin D, at least 2-3 times per year to avoid over-replacement.  6. Other fatigue Will continue to monitor.  7. Class 3 severe obesity with serious comorbidity and body mass index (BMI) of 50.0 to 59.9 in adult, unspecified obesity type ([place]) [name] is currently  in the action stage of change. As such, his goal is to continue with weight loss efforts. He has agreed to the Category 4 Plan.   Exercise goals: As is  Behavioral modification strategies: increasing lean protein intake, increasing water intake and no skipping meals.  [name] has agreed to follow-up with our clinic in 2 weeks. He was informed of the importance of frequent follow-up visits to maximize his success with intensive lifestyle modifications for his multiple health conditions.   Objective:   Blood pressure 100/60, pulse 71, temperature 98.2 F (36.8 C), temperature source Oral, height 6\' 3"  (1.905 m), weight (!) 449 lb (203.7 kg), SpO2 98 %. Body mass index is 56.12 kg/m.  General: Cooperative, alert, well developed, in no acute distress. HEENT: Conjunctivae and lids unremarkable. Cardiovascular: Regular rhythm.  Lungs: Normal work of breathing. Neurologic: No focal deficits.   Lab Results  Component Value Date   CREATININE 0.79 03/28/2019   BUN 9 03/28/2019   NA 138 03/28/2019   K 4.4 03/28/2019   CL 100 03/28/2019   CO2 24 03/28/2019   Lab Results  Component Value Date   ALT 39 03/28/2019   AST 18 03/28/2019   ALKPHOS 88 03/28/2019   BILITOT 0.7 03/28/2019   Lab Results  Component Value Date   HGBA1C 7.8 (H) 03/28/2019   HGBA1C 7.0 (H) 12/13/2017   HGBA1C  03/19/2009    5.9 (NOTE) The ADA recommends the following therapeutic goal for glycemic control related to Hgb A1c measurement: Goal of therapy: <6.5 Hgb A1c  Reference: American Diabetes Association: Clinical Practice Recommendations 2010, Diabetes Care, 2010, 33: (Suppl  1).   Lab Results  Component Value Date   INSULIN 99.3 (H) 03/28/2019   Lab Results  Component Value Date   TSH 2.530 03/28/2019   Lab Results  Component Value Date   CHOL 122 03/28/2019   HDL 36 (L) 03/28/2019   LDLCALC 70 03/28/2019   LDLDIRECT 52 03/26/2015   TRIG 81 03/28/2019   CHOLHDL 3.2 08/03/2018   Lab Results    Component Value Date   WBC 4.2 06/18/2019   HGB 12.2 (L) 06/18/2019   HCT 36.8 (L) 06/18/2019   MCV 82.2 06/18/2019   PLT 239.0 06/18/2019   Lab Results  Component Value Date   IRON 58 06/18/2019   TIBC 278 03/22/2009   FERRITIN 128.5 06/18/2019   Obesity Behavioral Intervention:   Approximately 15 minutes were spent on the discussion below.  ASK: We discussed the diagnosis of obesity with [name] today and  Danny Hinton agreed to give [place] permission to discuss obesity behavioral modification therapy today.  ASSESS: Danny Hinton has the diagnosis of obesity and his BMI today is 56.2. Danny Hinton is in the action stage of change.   ADVISE: Danny Hinton was educated on the multiple health risks of obesity as well as the benefit of weight loss to improve his health. He was advised of the need for long term treatment and the importance of lifestyle modifications to improve his current health and to decrease his risk of future health problems.  AGREE: Multiple dietary modification options and treatment options were discussed and Danny Hinton agreed to follow the recommendations documented in the above note.  ARRANGE: Danny Hinton was educated on the importance of frequent visits to treat obesity as outlined per CMS and USPSTF guidelines and agreed to schedule his next follow up appointment today.  Attestation Statements:   Reviewed by clinician on day of visit: allergies, medications, problem list, medical history, surgical history, family history, social history, and previous encounter notes.  I, Insurance claims handler, CMA, am acting as Energy manager for [employer], DO.  I have reviewed the above documentation for accuracy and completeness, and I agree with the above. Helane Rima, DO

## 2019-07-10 ENCOUNTER — Other Ambulatory Visit (INDEPENDENT_AMBULATORY_CARE_PROVIDER_SITE_OTHER): Payer: Self-pay | Admitting: *Deleted

## 2019-07-10 ENCOUNTER — Encounter (INDEPENDENT_AMBULATORY_CARE_PROVIDER_SITE_OTHER): Payer: Self-pay | Admitting: Family Medicine

## 2019-07-10 DIAGNOSIS — R110 Nausea: Secondary | ICD-10-CM

## 2019-07-10 MED ORDER — ONDANSETRON HCL 4 MG PO TABS: 4.0000 mg | ORAL_TABLET | Freq: Three times a day (TID) | ORAL | 0 refills | Status: DC | PRN

## 2019-07-10 NOTE — Telephone Encounter (Signed)
Please advise thanks.

## 2019-07-30 ENCOUNTER — Other Ambulatory Visit: Payer: Self-pay

## 2019-07-30 ENCOUNTER — Encounter (INDEPENDENT_AMBULATORY_CARE_PROVIDER_SITE_OTHER): Payer: Self-pay | Admitting: Family Medicine

## 2019-07-30 ENCOUNTER — Ambulatory Visit (INDEPENDENT_AMBULATORY_CARE_PROVIDER_SITE_OTHER): Payer: Medicaid Other | Admitting: Family Medicine

## 2019-07-30 VITALS — BP 111/68 | HR 72 | Temp 97.6°F | Ht 75.0 in | Wt >= 6400 oz

## 2019-07-30 DIAGNOSIS — K529 Noninfective gastroenteritis and colitis, unspecified: Secondary | ICD-10-CM | POA: Diagnosis not present

## 2019-07-30 DIAGNOSIS — E538 Deficiency of other specified B group vitamins: Secondary | ICD-10-CM | POA: Diagnosis not present

## 2019-07-30 DIAGNOSIS — E119 Type 2 diabetes mellitus without complications: Secondary | ICD-10-CM

## 2019-07-30 DIAGNOSIS — R5383 Other fatigue: Secondary | ICD-10-CM

## 2019-07-30 DIAGNOSIS — Z6841 Body Mass Index (BMI) 40.0 and over, adult: Secondary | ICD-10-CM

## 2019-07-30 DIAGNOSIS — E559 Vitamin D deficiency, unspecified: Secondary | ICD-10-CM

## 2019-07-30 NOTE — Progress Notes (Signed)
Chief Complaint:   OBESITY Danny Hinton is here to discuss his progress with his obesity treatment plan along with follow-up of his obesity related diagnoses. Danny Hinton is on the Category 4 Plan and states he is following his eating plan approximately 50% of the time. Danny Hinton states he is walking for 15 minutes 3-4 times per week.  Today's visit was #: 7 Starting weight: 452 lbs Starting date: 03/28/2019 Today's weight: 453 lbs Today's date: 07/30/2019 Total lbs lost to date: 0 Total lbs lost since last in-office visit: 0  Interim History: Danny Hinton says that he got an extension for his disability claim but it still needs to go to [employer].  [facility] has not sent the new patient packet yet.  They are working on "processing the info".  It has been 1-2 weeks.  Subjective:   1. Vitamin B12 deficiency He is not a vegetarian.  He does not have a previous diagnosis of pernicious anemia.  He does not have a history of weight loss surgery. General ROS: positive for  - fatigue. He also has daily loose stools.   Lab Results  Component Value Date   VITAMINB12 214 (L) 03/28/2019   2. Type 2 diabetes mellitus without complication, without long-term current use of insulin (HCC) Medications reviewed. Danny Hinton is taking Victoza at night.  He has nausea in the morning.  Lab Results  Component Value Date   HGBA1C 7.8 (H) 03/28/2019   HGBA1C 7.0 (H) 12/13/2017   HGBA1C  03/19/2009    5.9 (NOTE) The ADA recommends the following therapeutic goal for glycemic control related to Hgb A1c measurement: Goal of therapy: <6.5 Hgb A1c  Reference: American Diabetes Association: Clinical Practice Recommendations 2010, Diabetes Care, 2010, 33: (Suppl  1).   Lab Results  Component Value Date   LDLCALC 70 03/28/2019   CREATININE 0.79 03/28/2019   Lab Results  Component Value Date   INSULIN 99.3 (H) 03/28/2019   3. Vitamin D deficiency [place]'s Vitamin D level was 6.9 on 03/28/2019. He is currently  taking vit D. He denies nausea, vomiting or muscle weakness.  4. Chronic diarrhea Danny Hinton is followed by Dr. Adela Lank for this.  Every time he eats, he has to have a BM.  Imodium (2) helps decrease this, but causes increased bloating and hemorrhoid flares.  5. Other fatigue General ROS: positive for  - fatigue and malaise.  Assessment/Plan:   1. Vitamin B12 deficiency We will continue to monitor. Orders and follow up as documented in patient record.  Counseling . The body needs vitamin B12: to make red blood cells; to make DNA; and to help the nerves work properly so they can carry messages from the brain to the body.  . The main causes of vitamin B12 deficiency include dietary deficiency, digestive diseases, pernicious anemia, and having a surgery in which part of the stomach or small intestine is removed.  . Certain medicines can make it harder for the body to absorb vitamin B12. These medicines include: heartburn medications; some antibiotics; some medications used to treat diabetes, gout, and high cholesterol.  . In some cases, there are no symptoms of this condition. If the condition leads to anemia or nerve damage, various symptoms can occur, such as weakness or fatigue, shortness of breath, and numbness or tingling in your hands and feet.   . Treatment:  o May include taking vitamin B12 supplements.  o Avoid alcohol.  o Eat lots of healthy foods that contain vitamin B12: -  Beef, pork, chicken, [place], and organ meats, such as liver.  - Seafood: This includes clams, rainbow trout, salmon, tuna, and haddock. Eggs.  - Cereal and dairy products that are fortified: This means that vitamin B12 has been added to the food.   2. Type 2 diabetes mellitus without complication, without long-term current use of insulin (HCC) Good blood sugar control is important to decrease the likelihood of diabetic complications such as nephropathy, neuropathy, limb loss, blindness, coronary artery disease, and  death. Intensive lifestyle modification including diet, exercise and weight loss are the first line of treatment for diabetes.  Will change Victoza to morning dosing.  Orders - Comprehensive metabolic panel - Hemoglobin A1c  3. Vitamin D deficiency Low Vitamin D level contributes to fatigue and are associated with obesity, breast, and colon cancer. He agrees to continue to take prescription Vitamin D @50 ,000 IU every week and will follow-up for routine testing of Vitamin D, at least 2-3 times per year to avoid over-replacement.  Orders - VITAMIN D 25 Hydroxy (Vit-D Deficiency, Fractures)  4. Chronic diarrhea Will check labs today.  Orders - CBC with Differential/Platelet  5. Other fatigue [name] does feel that his weight is causing his energy to be lower than it should be. Fatigue may be related to obesity, depression or many other causes. Labs will be ordered, and in the meanwhile, [name] will focus on self care including making healthy food choices, increasing physical activity and focusing on stress reduction.  Orders - Lipid panel - Anemia panel  6. Class 3 severe obesity with serious comorbidity and body mass index (BMI) of 50.0 to 59.9 in adult, unspecified obesity type ([place]) [name] is currently in the action stage of change. As such, his goal is to continue with weight loss efforts. He has agreed to the Category 4 Plan.   Exercise goals: As is.  Behavioral modification strategies: increasing lean protein intake.  [name] has agreed to follow-up with our clinic in 2 weeks. He was informed of the importance of frequent follow-up visits to maximize his success with intensive lifestyle modifications for his multiple health conditions.   [name] was informed we would discuss his lab results at his next visit unless there is a critical issue that needs to be addressed sooner. [name] agreed to keep his next visit at the agreed upon time to discuss these results.  Objective:   Blood pressure  111/68, pulse 72, temperature 97.6 F (36.4 C), temperature source Oral, height 6\' 3"  (1.905 m), weight (!) 453 lb (205.5 kg), SpO2 95 %. Body mass index is 56.62 kg/m.  General: Cooperative, alert, well developed, in no acute distress. HEENT: Conjunctivae and lids unremarkable. Cardiovascular: Regular rhythm.  Lungs: Normal work of breathing. Neurologic: No focal deficits.   Lab Results  Component Value Date   CREATININE 0.79 03/28/2019   BUN 9 03/28/2019   NA 138 03/28/2019   K 4.4 03/28/2019   CL 100 03/28/2019   CO2 24 03/28/2019   Lab Results  Component Value Date   ALT 39 03/28/2019   AST 18 03/28/2019   ALKPHOS 88 03/28/2019   BILITOT 0.7 03/28/2019   Lab Results  Component Value Date   HGBA1C 7.8 (H) 03/28/2019   HGBA1C 7.0 (H) 12/13/2017   HGBA1C  03/19/2009    5.9 (NOTE) The ADA recommends the following therapeutic goal for glycemic control related to Hgb A1c measurement: Goal of therapy: <6.5 Hgb A1c  Reference: American Diabetes Association: Clinical Practice Recommendations 2010, Diabetes Care, 2010,  33: (Suppl  1).   Lab Results  Component Value Date   INSULIN 99.3 (H) 03/28/2019   Lab Results  Component Value Date   TSH 2.530 03/28/2019   Lab Results  Component Value Date   CHOL 122 03/28/2019   HDL 36 (L) 03/28/2019   LDLCALC 70 03/28/2019   LDLDIRECT 52 03/26/2015   TRIG 81 03/28/2019   CHOLHDL 3.2 08/03/2018   Lab Results  Component Value Date   WBC 4.2 06/18/2019   HGB 12.2 (L) 06/18/2019   HCT 36.8 (L) 06/18/2019   MCV 82.2 06/18/2019   PLT 239.0 06/18/2019   Lab Results  Component Value Date   IRON 58 06/18/2019   TIBC 278 03/22/2009   FERRITIN 128.5 06/18/2019   Attestation Statements:   Reviewed by clinician on day of visit: allergies, medications, problem list, medical history, surgical history, family history, social history, and previous encounter notes.  I, Insurance claims handler, CMA, am acting as Energy manager for [employer], DO.  I have reviewed the above documentation for accuracy and completeness, and I agree with the above. Helane Rima, DO

## 2019-07-31 LAB — ANEMIA PANEL
Ferritin: 148 ng/mL (ref 30–400)
Folate, Hemolysate: 377 ng/mL
Folate, RBC: 1024 ng/mL (ref >498)
Hematocrit: 36.8 % — ABNORMAL LOW (ref 37.5–51.0)
Iron Saturation: 26 % (ref 15–55)
Iron: 68 ug/dL (ref 38–169)
Retic Ct Pct: 1.2 % (ref 0.6–2.6)
Total Iron Binding Capacity: 259 ug/dL (ref 250–450)
UIBC: 191 ug/dL (ref 111–343)
Vitamin B-12: 411 pg/mL (ref 232–1245)

## 2019-07-31 LAB — CBC WITH DIFFERENTIAL/PLATELET
Basophils Absolute: 0 10*3/uL (ref 0.0–0.2)
Basos: 1 %
EOS (ABSOLUTE): 0.2 10*3/uL (ref 0.0–0.4)
Eos: 4 %
Hemoglobin: 11.8 g/dL — ABNORMAL LOW (ref 13.0–17.7)
Immature Grans (Abs): 0 10*3/uL (ref 0.0–0.1)
Immature Granulocytes: 0 %
Lymphocytes Absolute: 1.5 10*3/uL (ref 0.7–3.1)
Lymphs: 33 %
MCH: 26.6 pg (ref 26.6–33.0)
MCHC: 32.1 g/dL (ref 31.5–35.7)
MCV: 83 fL (ref 79–97)
Monocytes Absolute: 0.4 10*3/uL (ref 0.1–0.9)
Monocytes: 9 %
Neutrophils Absolute: 2.3 10*3/uL (ref 1.4–7.0)
Neutrophils: 53 %
Platelets: 261 10*3/uL (ref 150–450)
RBC: 4.44 x10E6/uL (ref 4.14–5.80)
RDW: 14.4 % (ref 11.6–15.4)
WBC: 4.4 10*3/uL (ref 3.4–10.8)

## 2019-07-31 LAB — COMPREHENSIVE METABOLIC PANEL
ALT: 27 IU/L (ref 0–44)
AST: 14 IU/L (ref 0–40)
Albumin/Globulin Ratio: 1.3 (ref 1.2–2.2)
Albumin: 4.3 g/dL (ref 4.0–5.0)
Alkaline Phosphatase: 81 IU/L (ref 39–117)
BUN/Creatinine Ratio: 14 (ref 9–20)
BUN: 12 mg/dL (ref 6–20)
Bilirubin Total: 0.6 mg/dL (ref 0.0–1.2)
CO2: 27 mmol/L (ref 20–29)
Calcium: 9.1 mg/dL (ref 8.7–10.2)
Chloride: 99 mmol/L (ref 96–106)
Creatinine, Ser: 0.86 mg/dL (ref 0.76–1.27)
GFR calc Af Amer: 127 mL/min/{1.73_m2} (ref >59)
GFR calc non Af Amer: 110 mL/min/{1.73_m2} (ref >59)
Globulin, Total: 3.3 g/dL (ref 1.5–4.5)
Glucose: 107 mg/dL — ABNORMAL HIGH (ref 65–99)
Potassium: 4.2 mmol/L (ref 3.5–5.2)
Sodium: 137 mmol/L (ref 134–144)
Total Protein: 7.6 g/dL (ref 6.0–8.5)

## 2019-07-31 LAB — HEMOGLOBIN A1C
Est. average glucose Bld gHb Est-mCnc: 171 mg/dL
Hgb A1c MFr Bld: 7.6 % — ABNORMAL HIGH (ref 4.8–5.6)

## 2019-07-31 LAB — LIPID PANEL
Chol/HDL Ratio: 3.3 ratio (ref 0.0–5.0)
Cholesterol, Total: 133 mg/dL (ref 100–199)
HDL: 40 mg/dL (ref >39)
LDL Chol Calc (NIH): 76 mg/dL (ref 0–99)
Triglycerides: 86 mg/dL (ref 0–149)
VLDL Cholesterol Cal: 17 mg/dL (ref 5–40)

## 2019-07-31 LAB — VITAMIN D 25 HYDROXY (VIT D DEFICIENCY, FRACTURES): Vit D, 25-Hydroxy: 19.7 ng/mL — ABNORMAL LOW (ref 30.0–100.0)

## 2019-08-02 ENCOUNTER — Encounter (INDEPENDENT_AMBULATORY_CARE_PROVIDER_SITE_OTHER): Payer: Self-pay | Admitting: Family Medicine

## 2019-08-02 NOTE — Telephone Encounter (Signed)
Please advise. Thanks.  

## 2019-08-06 ENCOUNTER — Other Ambulatory Visit: Payer: Self-pay | Admitting: Family Medicine

## 2019-08-06 DIAGNOSIS — I480 Paroxysmal atrial fibrillation: Secondary | ICD-10-CM

## 2019-08-06 DIAGNOSIS — I10 Essential (primary) hypertension: Secondary | ICD-10-CM

## 2019-08-08 ENCOUNTER — Other Ambulatory Visit: Payer: Self-pay | Admitting: Family Medicine

## 2019-08-13 ENCOUNTER — Encounter (INDEPENDENT_AMBULATORY_CARE_PROVIDER_SITE_OTHER): Payer: Self-pay | Admitting: Family Medicine

## 2019-08-13 NOTE — Telephone Encounter (Signed)
Please advise thanks.

## 2019-08-14 ENCOUNTER — Encounter: Payer: Self-pay | Admitting: Family Medicine

## 2019-08-17 ENCOUNTER — Other Ambulatory Visit: Payer: Self-pay

## 2019-08-17 ENCOUNTER — Encounter (INDEPENDENT_AMBULATORY_CARE_PROVIDER_SITE_OTHER): Payer: Self-pay | Admitting: Family Medicine

## 2019-08-17 ENCOUNTER — Telehealth: Payer: Self-pay

## 2019-08-17 DIAGNOSIS — I480 Paroxysmal atrial fibrillation: Secondary | ICD-10-CM

## 2019-08-17 DIAGNOSIS — I10 Essential (primary) hypertension: Secondary | ICD-10-CM

## 2019-08-17 MED ORDER — DIGOXIN 125 MCG PO TABS: 125.0000 ug | ORAL_TABLET | ORAL | 3 refills | Status: DC

## 2019-08-17 NOTE — Telephone Encounter (Signed)
OK to prescribe the digoxin 0.125 mg daily with a year of refills

## 2019-08-19 ENCOUNTER — Other Ambulatory Visit: Payer: Self-pay | Admitting: Family Medicine

## 2019-08-19 DIAGNOSIS — I480 Paroxysmal atrial fibrillation: Secondary | ICD-10-CM

## 2019-08-19 DIAGNOSIS — Z7901 Long term (current) use of anticoagulants: Secondary | ICD-10-CM

## 2019-08-20 ENCOUNTER — Other Ambulatory Visit: Payer: Self-pay | Admitting: Surgery

## 2019-08-20 ENCOUNTER — Other Ambulatory Visit: Payer: Self-pay

## 2019-08-20 ENCOUNTER — Ambulatory Visit (INDEPENDENT_AMBULATORY_CARE_PROVIDER_SITE_OTHER): Payer: Medicaid Other | Admitting: Family Medicine

## 2019-08-20 ENCOUNTER — Encounter (INDEPENDENT_AMBULATORY_CARE_PROVIDER_SITE_OTHER): Payer: Self-pay | Admitting: Family Medicine

## 2019-08-20 VITALS — BP 96/63 | HR 71 | Temp 98.0°F | Ht 75.0 in | Wt >= 6400 oz

## 2019-08-20 DIAGNOSIS — K529 Noninfective gastroenteritis and colitis, unspecified: Secondary | ICD-10-CM | POA: Diagnosis not present

## 2019-08-20 DIAGNOSIS — Z6841 Body Mass Index (BMI) 40.0 and over, adult: Secondary | ICD-10-CM | POA: Diagnosis not present

## 2019-08-20 DIAGNOSIS — Z7189 Other specified counseling: Secondary | ICD-10-CM

## 2019-08-20 DIAGNOSIS — F3289 Other specified depressive episodes: Secondary | ICD-10-CM | POA: Diagnosis not present

## 2019-08-20 DIAGNOSIS — E119 Type 2 diabetes mellitus without complications: Secondary | ICD-10-CM | POA: Diagnosis not present

## 2019-08-20 DIAGNOSIS — E559 Vitamin D deficiency, unspecified: Secondary | ICD-10-CM | POA: Diagnosis not present

## 2019-08-20 DIAGNOSIS — I712 Thoracic aortic aneurysm, without rupture, unspecified: Secondary | ICD-10-CM

## 2019-08-20 MED ORDER — VITAMIN D (ERGOCALCIFEROL) 1.25 MG (50000 UNIT) PO CAPS: 50000.0000 [IU] | ORAL_CAPSULE | ORAL | 0 refills | Status: DC

## 2019-08-20 NOTE — Progress Notes (Signed)
Chief Complaint:   OBESITY Danny Hinton is here to discuss his progress with his obesity treatment plan along with follow-up of his obesity related diagnoses. Danny Hinton is on the Category 4 Plan and states he is following his eating plan approximately 25% of the time. Danny Hinton states he is walking for 30-60 minutes 5 times per week.  Today's visit was #: 8 Starting weight: 452 lbs Starting date: 03/28/2019 Today's weight: 451 lbs Today's date: 08/20/2019 Total lbs lost to date: 1 lb Total lbs lost since last in-office visit: 2 lbs  Interim History: Danny Hinton reports that he was told Medicaid will not cover bariatric surgery.  He says he is drinking 10-15 bottles of water/day.  He endorses decreased appetite, has been focusing on protein, and has been decreasing his carbohydrates.   Subjective:   1. Type 2 diabetes mellitus without complication, without long-term current use of insulin (HCC) Medications reviewed.  He is taking Victoza 0.6 mg daily.  Lab Results  Component Value Date   HGBA1C 7.6 (H) 07/30/2019   HGBA1C 7.8 (H) 03/28/2019   HGBA1C 7.0 (H) 12/13/2017   Lab Results  Component Value Date   LDLCALC 76 07/30/2019   CREATININE 0.86 07/30/2019   Lab Results  Component Value Date   INSULIN 99.3 (H) 03/28/2019   2. Vitamin D deficiency Danny Hinton's Vitamin D level was 19.7 on 07/30/2019. He is currently taking prescription vitamin D 50,000 IU each week. He denies nausea, vomiting or muscle weakness.  3. Chronic diarrhea Danny Hinton says he is no longer having watery stools after each meal.  He is still having a bowel movement, but now solid to hard.  4. Counseled about COVID-19 virus infection Danny Hinton reports that he will be getting his second COVID vaccine tomorrow.  5. Other depression, emotional eating He says he has more cravings for sweets when he is depressed.    Assessment/Plan:   1. Type 2 diabetes mellitus without complication, without long-term current use of insulin (HCC) Goal is  to increase Victoza to 1.8 mg subcutaneously gradually.  Behavior modification techniques were discussed today to help Danny Hinton deal with his emotional/non-hunger eating behaviors.  Orders and follow up as documented in patient record.   2. Vitamin D deficiency Low Vitamin D level contributes to fatigue and are associated with obesity, breast, and colon cancer. He agrees to continue to take prescription Vitamin D @50 ,000 IU every week and will follow-up for routine testing of Vitamin D, at least 2-3 times per year to avoid over-replacement.  Orders - Vitamin D, Ergocalciferol, (DRISDOL) 1.25 MG (50000 UNIT) CAPS capsule; Take 1 capsule (50,000 Units total) by mouth every 3 (three) days.  Dispense: 8 capsule; Refill: 0  3. Chronic diarrhea will add Metamucil to his water at meals.  4. Counseled about COVID-19 virus infection We will continue to monitor. Orders and follow up as documented in patient record.  Counseling  COVID-19 is a respiratory infection that is caused by a virus. It can cause serious infections, such as pneumonia, acute respiratory distress syndrome, acute respiratory failure, or sepsis.  You are more likely to develop a serious illness if you are 40 years of age or older, have a weak immune system, live in a nursing home, have chronic disease, or have obesity.  Get vaccinated as soon as they are available to you.  For our most current information, please visit 44.  Wash your hands often with soap and water for 20 seconds. If soap and water are not  available, use alcohol-based hand sanitizer.  Wear a face mask. Make sure your mask covers your nose and mouth.  Maintain at least 6 feet distance from others when in public.  Get help right away if  You have trouble breathing, chest pain, confusion, or other concerning symptoms.  5. Other depression, emotional eating Danny Hinton will try increasing his amount of movement to help with his  depression/stress.  6. Class 3 severe obesity with serious comorbidity and body mass index (BMI) of 50.0 to 59.9 in adult, unspecified obesity type ([place]) Danny Hinton is currently in the action stage of change. As such, his goal is to continue with weight loss efforts. He has agreed to the Category 4 Plan.   Exercise goals: For substantial health benefits, adults should do at least 150 minutes (2 hours and 30 minutes) a week of moderate-intensity, or 75 minutes (1 hour and 15 minutes) a week of vigorous-intensity aerobic physical activity, or an equivalent combination of moderate- and vigorous-intensity aerobic activity. Aerobic activity should be performed in episodes of at least 10 minutes, and preferably, it should be spread throughout the week.  Behavioral modification strategies: increasing lean protein intake and no skipping meals.  Danny Hinton has agreed to follow-up with our clinic in 2 weeks. He was informed of the importance of frequent follow-up visits to maximize his success with intensive lifestyle modifications for his multiple health conditions.   Objective:   Blood pressure 96/63, pulse 71, temperature 98 F (36.7 C), temperature source Oral, height 6\' 3"  (1.905 m), weight (!) 451 lb (204.6 kg), SpO2 97 %. Body mass index is 56.37 kg/m.  General: Cooperative, alert, well developed, in no acute distress. HEENT: Conjunctivae and lids unremarkable. Cardiovascular: Regular rhythm.  Lungs: Normal work of breathing. Neurologic: No focal deficits.   Lab Results  Component Value Date   CREATININE 0.86 07/30/2019   BUN 12 07/30/2019   NA 137 07/30/2019   K 4.2 07/30/2019   CL 99 07/30/2019   CO2 27 07/30/2019   Lab Results  Component Value Date   ALT 27 07/30/2019   AST 14 07/30/2019   ALKPHOS 81 07/30/2019   BILITOT 0.6 07/30/2019   Lab Results  Component Value Date   HGBA1C 7.6 (H) 07/30/2019   HGBA1C 7.8 (H) 03/28/2019   HGBA1C 7.0 (H) 12/13/2017   HGBA1C  03/19/2009     5.9 (NOTE) The ADA recommends the following therapeutic goal for glycemic control related to Hgb A1c measurement: Goal of therapy: <6.5 Hgb A1c  Reference: American Diabetes Association: Clinical Practice Recommendations 2010, Diabetes Care, 2010, 33: (Suppl  1).   Lab Results  Component Value Date   INSULIN 99.3 (H) 03/28/2019   Lab Results  Component Value Date   TSH 2.530 03/28/2019   Lab Results  Component Value Date   CHOL 133 07/30/2019   HDL 40 07/30/2019   LDLCALC 76 07/30/2019   LDLDIRECT 52 03/26/2015   TRIG 86 07/30/2019   CHOLHDL 3.3 07/30/2019   Lab Results  Component Value Date   WBC 4.4 07/30/2019   HGB 11.8 (L) 07/30/2019   HCT 36.8 (L) 07/30/2019   MCV 83 07/30/2019   PLT 261 07/30/2019   Lab Results  Component Value Date   IRON 68 07/30/2019   TIBC 259 07/30/2019   FERRITIN 148 07/30/2019   Attestation Statements:   Reviewed by clinician on day of visit: allergies, medications, problem list, medical history, surgical history, family history, social history, and previous encounter notes.  I, Water quality scientist,  CMA, am acting as transcriptionist for [employer], DO.  I have reviewed the above documentation for accuracy and completeness, and I agree with the above. Briscoe Deutscher, DO

## 2019-08-20 NOTE — Telephone Encounter (Signed)
Please advise. Thanks.  

## 2019-08-21 ENCOUNTER — Encounter (INDEPENDENT_AMBULATORY_CARE_PROVIDER_SITE_OTHER): Payer: Self-pay | Admitting: *Deleted

## 2019-08-23 ENCOUNTER — Encounter (INDEPENDENT_AMBULATORY_CARE_PROVIDER_SITE_OTHER): Payer: Self-pay | Admitting: Family Medicine

## 2019-08-29 ENCOUNTER — Encounter: Payer: Self-pay | Admitting: Family Medicine

## 2019-08-29 ENCOUNTER — Telehealth (INDEPENDENT_AMBULATORY_CARE_PROVIDER_SITE_OTHER): Payer: Medicaid Other | Admitting: Family Medicine

## 2019-08-29 DIAGNOSIS — I480 Paroxysmal atrial fibrillation: Secondary | ICD-10-CM | POA: Diagnosis not present

## 2019-08-29 DIAGNOSIS — E8881 Metabolic syndrome: Secondary | ICD-10-CM | POA: Diagnosis not present

## 2019-08-29 DIAGNOSIS — Z7901 Long term (current) use of anticoagulants: Secondary | ICD-10-CM | POA: Diagnosis not present

## 2019-08-29 DIAGNOSIS — Z6841 Body Mass Index (BMI) 40.0 and over, adult: Secondary | ICD-10-CM | POA: Diagnosis not present

## 2019-08-29 MED ORDER — WARFARIN SODIUM 5 MG PO TABS: ORAL_TABLET | ORAL | 3 refills | Status: DC

## 2019-08-29 NOTE — Progress Notes (Signed)
Virtual Visit via Mychart Video Note  I connected with Danny Hinton on 08/29/19 at 1305 by video and verified that I am speaking with the correct person using two identifiers. Danny Hinton is currently located at home and no  other people are currently with her during visit. The provider, Fransisca Kaufmann Alexander Mcauley, MD is located in their office at time of visit.  Call ended at 1317  I discussed the limitations, risks, security and privacy concerns of performing an evaluation and management service by video and the availability of in person appointments. I also discussed with the patient that there may be a patient responsible charge related to this service. The patient expressed understanding and agreed to proceed.   History and Present Illness: Patient is currently calling in for referral to weight loss, he has been working with nutritionist and trying to help things but showed to be recommended he go to bariatric clinic.  He says there are some early videos for and would like to be referred to [facility] surgery.  He is morbidly obese and has A. fib and hypertension and diabetes which are all risk factors for him.  1. Metabolic syndrome   2. Class 3 severe obesity due to excess calories with serious comorbidity and body mass index (BMI) of 50.0 to 59.9 in adult [facility])   3. Paroxysmal atrial fibrillation ([place])   4. Long term current use of anticoagulant therapy     Outpatient Encounter Medications as of 08/29/2019  Medication Sig  . ALPRAZolam (XANAX) 1 MG tablet Take 1 tablet (1 mg total) by mouth 3 (three) times daily as needed for anxiety.  Marland Kitchen atorvastatin (LIPITOR) 40 MG tablet Take 1 tablet (40 mg total) by mouth daily with breakfast. (Needs visit with PCP)  . buPROPion (WELLBUTRIN XL) 150 MG 24 hr tablet Take 1 tablet (150 mg total) by mouth daily.  . carvedilol (COREG) 25 MG tablet Take 1.5 tablets (37.5 mg total) by mouth 2 (two) times daily with a meal.  . digoxin (LANOXIN) 0.125 MG  tablet Take 1 tablet (125 mcg total) by mouth every other day.  . fluticasone (FLONASE) 50 MCG/ACT nasal spray Place 1 spray into both nostrils as needed.  . furosemide (LASIX) 20 MG tablet Take 1 tablet (20 mg total) by mouth every other day.  . Insulin Pen Needle (BD PEN NEEDLE NANO 2ND GEN) 32G X 4 MM MISC 1 Package by Does not apply route 2 (two) times daily.  Marland Kitchen liraglutide (VICTOZA) 18 MG/3ML SOPN Inject 0.1 mLs (0.6 mg total) into the skin every morning.  . loperamide (IMODIUM A-D) 2 MG tablet Take as needed  . loratadine (CLARITIN) 10 MG tablet Take 1 tablet (10 mg total) by mouth daily. Patient needs to be seen before next refill.  . metoprolol tartrate (LOPRESSOR) 25 MG tablet Take 1 tablet as needed for palpitations  . ondansetron (ZOFRAN) 4 MG tablet Take 1 tablet (4 mg total) by mouth every 8 (eight) hours as needed for nausea or vomiting.  . Probiotic Product (VSL#3) CAPS Take twice a day  . sacubitril-valsartan (ENTRESTO) 49-51 MG Take 1 tablet by mouth 2 (two) times daily.  . sertraline (ZOLOFT) 100 MG tablet Take 100 mg by mouth daily.  . Vitamin D, Ergocalciferol, (DRISDOL) 1.25 MG (50000 UNIT) CAPS capsule Take 1 capsule (50,000 Units total) by mouth every 3 (three) days.  Marland Kitchen warfarin (COUMADIN) 5 MG tablet TAKE 1 TABLET BY MOUTH ONCE DAILY EXCEPT  TAKE  1  &  1/2  TABLET  EACH  FRIDAY  OR  AS  DIRECTED  BY  ANTICOAGULATION  CLINIC  . [DISCONTINUED] warfarin (COUMADIN) 5 MG tablet TAKE 1 TABLET BY MOUTH ONCE DAILY EXCEPT  TAKE  1  &  1/2  TABLET  EACH  FRIDAY  OR  AS  DIRECTED  BY  ANTICOAGULATION  CLINIC   No facility-administered encounter medications on file as of 08/29/2019.    Review of Systems  Constitutional: Positive for unexpected weight change. Negative for chills and fever.  Eyes: Negative for visual disturbance.  Respiratory: Negative for shortness of breath and wheezing.   Cardiovascular: Negative for chest pain and leg swelling.  Musculoskeletal: Negative for  back pain and gait problem.  Skin: Negative for rash.  All other systems reviewed and are negative.   Observations/Objective: Patient sounds looks comfortable and in no acute distress  Assessment and Plan: Problem List Items Addressed This Visit      Cardiovascular and Mediastinum   Paroxysmal atrial fibrillation (HCC)   Relevant Medications   warfarin (COUMADIN) 5 MG tablet     Other   Long term current use of anticoagulant therapy, Coumadin   Relevant Medications   warfarin (COUMADIN) 5 MG tablet   Metabolic syndrome - Primary   Relevant Orders   Amb Referral to Bariatric Surgery   Class 3 severe obesity with serious comorbidity and body mass index (BMI) of 50.0 to 59.9 in adult [facility])   Relevant Orders   Amb Referral to Bariatric Surgery      Patient has been fighting obesity for quite some time, will do referral to bariatric clinic Follow up plan: Return if symptoms worsen or fail to improve.     I discussed the assessment and treatment plan with the patient. The patient was provided an opportunity to ask questions and all were answered. The patient agreed with the plan and demonstrated an understanding of the instructions.   The patient was advised to call back or seek an in-person evaluation if the symptoms worsen or if the condition fails to improve as anticipated.  The above assessment and management plan was discussed with the patient. The patient verbalized understanding of and has agreed to the management plan. Patient is aware to call the clinic if symptoms persist or worsen. Patient is aware when to return to the clinic for a follow-up visit. Patient educated on when it is appropriate to go to the emergency department.    I provided 12 minutes of non-face-to-face time during this encounter.    Nils Pyle, MD

## 2019-08-30 ENCOUNTER — Telehealth: Payer: Self-pay | Admitting: Family Medicine

## 2019-09-04 ENCOUNTER — Encounter (INDEPENDENT_AMBULATORY_CARE_PROVIDER_SITE_OTHER): Payer: Self-pay | Admitting: Family Medicine

## 2019-09-05 ENCOUNTER — Other Ambulatory Visit (INDEPENDENT_AMBULATORY_CARE_PROVIDER_SITE_OTHER): Payer: Self-pay | Admitting: Family Medicine

## 2019-09-05 ENCOUNTER — Telehealth: Payer: Self-pay | Admitting: Family Medicine

## 2019-09-05 DIAGNOSIS — E119 Type 2 diabetes mellitus without complications: Secondary | ICD-10-CM

## 2019-09-05 NOTE — Telephone Encounter (Signed)
FYI

## 2019-09-10 ENCOUNTER — Telehealth: Payer: Self-pay | Admitting: Family Medicine

## 2019-09-11 ENCOUNTER — Other Ambulatory Visit: Payer: Self-pay

## 2019-09-11 ENCOUNTER — Ambulatory Visit (INDEPENDENT_AMBULATORY_CARE_PROVIDER_SITE_OTHER): Payer: Medicaid Other | Admitting: Family Medicine

## 2019-09-11 ENCOUNTER — Encounter (INDEPENDENT_AMBULATORY_CARE_PROVIDER_SITE_OTHER): Payer: Self-pay | Admitting: Family Medicine

## 2019-09-11 VITALS — BP 96/62 | HR 85 | Temp 98.6°F | Ht 75.0 in | Wt >= 6400 oz

## 2019-09-11 DIAGNOSIS — F4321 Adjustment disorder with depressed mood: Secondary | ICD-10-CM | POA: Diagnosis not present

## 2019-09-11 DIAGNOSIS — E559 Vitamin D deficiency, unspecified: Secondary | ICD-10-CM | POA: Diagnosis not present

## 2019-09-11 DIAGNOSIS — Z6841 Body Mass Index (BMI) 40.0 and over, adult: Secondary | ICD-10-CM | POA: Diagnosis not present

## 2019-09-11 DIAGNOSIS — E1169 Type 2 diabetes mellitus with other specified complication: Secondary | ICD-10-CM | POA: Diagnosis not present

## 2019-09-11 DIAGNOSIS — E119 Type 2 diabetes mellitus without complications: Secondary | ICD-10-CM

## 2019-09-11 MED ORDER — FLUOXETINE HCL 20 MG PO TABS: 20.0000 mg | ORAL_TABLET | Freq: Every day | ORAL | 3 refills | Status: DC

## 2019-09-11 NOTE — Progress Notes (Signed)
Chief Complaint:   OBESITY Danny Hinton is here to discuss his progress with his obesity treatment plan along with follow-up of his obesity related diagnoses. Danny Hinton is on the Category 4 Plan and states he is following his eating plan approximately 40% of the time. Danny Hinton states he is exercising for 0 minutes 0 times per week.  Today's visit was #: 9 Starting weight: 452 lbs Starting date: 03/28/2019 Today's weight: 452 lbs Today's date: 09/11/2019 Total lbs lost to date: 0 Total lbs lost since last in-office visit: 0  Interim History: Danny Hinton is up 5 pounds of fluid today.  He says the heat and humidity make him feel like he can't breathe, so he is not getting out much.  He reports that his PCP is helping him get to the bariatric surgery center. We checked the chart together and see that he needs to call his PCP's office as they left a message.   Subjective:   1. Vitamin D deficiency Danny Hinton's Vitamin D level was 19.7 on 07/30/2019. He is currently taking prescription vitamin D 50,000 IU each week. He denies nausea, vomiting or muscle weakness.  2. Type 2 diabetes mellitus with other complication Medications reviewed. Diabetic ROS: no polyuria or polydipsia, no chest pain, dyspnea or TIA's, no numbness, tingling or pain in extremities.  He has increased Victoza up to 1.8 mg daily.  He has had decreased diarrhea but continues to have a BM after every meal.  Lab Results  Component Value Date   HGBA1C 7.6 (H) 07/30/2019   HGBA1C 7.8 (H) 03/28/2019   HGBA1C 7.0 (H) 12/13/2017   Lab Results  Component Value Date   LDLCALC 76 07/30/2019   CREATININE 0.86 07/30/2019   Lab Results  Component Value Date   INSULIN 99.3 (H) 03/28/2019   3. Situational depression Danny Hinton is struggling with emotional eating and using food for comfort to the extent that it is negatively impacting his health. He has been working on behavior modification techniques to help reduce his emotional eating - or lack of food  intake.  Assessment/Plan:   1. Vitamin D deficiency Low Vitamin D level contributes to fatigue and are associated with obesity, breast, and colon cancer. He agrees to continue to take prescription Vitamin D @50 ,000 IU every week and will follow-up for routine testing of Vitamin D, at least 2-3 times per year to avoid over-replacement.  2. Type 2 diabetes mellitus without complication, without long-term current use of insulin (HCC) Good blood sugar control is important to decrease the likelihood of diabetic complications such as nephropathy, neuropathy, limb loss, blindness, coronary artery disease, and death. Intensive lifestyle modification including diet, exercise and weight loss are the first line of treatment for diabetes.   3. Situational depression Behavior modification techniques were discussed today to help Danny Hinton deal with his emotional/non-hunger eating behaviors.  Orders and follow up as documented in patient record.   Orders - FLUoxetine (PROZAC) 20 MG tablet; Take 1 tablet (20 mg total) by mouth daily.  Dispense: 30 tablet; Refill: 3  4. Class 3 severe obesity with serious comorbidity and body mass index (BMI) of 50.0 to 59.9 in adult, unspecified obesity type (Danny Hinton) Danny Hinton is currently in the action stage of change. As such, his goal is to continue with weight loss efforts. He has Hinton to the Category 4 Plan.   Exercise goals: As is.  Behavioral modification strategies: increasing lean protein intake, decreasing simple carbohydrates, increasing vegetables and emotional eating strategies.  Danny Hinton has Hinton  to follow-up with our clinic in 2 weeks. He was informed of the importance of frequent follow-up visits to maximize his success with intensive lifestyle modifications for his multiple health conditions.   Objective:   Blood pressure 96/62, pulse 85, temperature 98.6 F (37 C), temperature source Oral, height 6\' 3"  (1.905 m), weight (!) 452 lb (205 kg), SpO2 93 %. Body mass  index is 56.5 kg/m.  General: Cooperative, alert, well developed, in no acute distress. HEENT: Conjunctivae and lids unremarkable. Cardiovascular: Regular rhythm.  Lungs: Normal work of breathing. Neurologic: No focal deficits.   Lab Results  Component Value Date   CREATININE 0.86 07/30/2019   BUN 12 07/30/2019   NA 137 07/30/2019   K 4.2 07/30/2019   CL 99 07/30/2019   CO2 27 07/30/2019   Lab Results  Component Value Date   ALT 27 07/30/2019   AST 14 07/30/2019   ALKPHOS 81 07/30/2019   BILITOT 0.6 07/30/2019   Lab Results  Component Value Date   HGBA1C 7.6 (H) 07/30/2019   HGBA1C 7.8 (H) 03/28/2019   HGBA1C 7.0 (H) 12/13/2017   HGBA1C  03/19/2009    5.9 (NOTE) The ADA recommends the following therapeutic goal for glycemic control related to Hgb A1c measurement: Goal of therapy: <6.5 Hgb A1c  Reference: American Diabetes Association: Clinical Practice Recommendations 2010, Diabetes Care, 2010, 33: (Suppl  1).   Lab Results  Component Value Date   INSULIN 99.3 (H) 03/28/2019   Lab Results  Component Value Date   TSH 2.530 03/28/2019   Lab Results  Component Value Date   CHOL 133 07/30/2019   HDL 40 07/30/2019   LDLCALC 76 07/30/2019   LDLDIRECT 52 03/26/2015   TRIG 86 07/30/2019   CHOLHDL 3.3 07/30/2019   Lab Results  Component Value Date   WBC 4.4 07/30/2019   HGB 11.8 (L) 07/30/2019   HCT 36.8 (L) 07/30/2019   MCV 83 07/30/2019   PLT 261 07/30/2019   Lab Results  Component Value Date   IRON 68 07/30/2019   TIBC 259 07/30/2019   FERRITIN 148 07/30/2019   Obesity Behavioral Intervention:   Approximately 15 minutes were spent on the discussion below.  ASK: We discussed the diagnosis of obesity with Danny Hinton Hinton to give Danny Hinton permission to discuss obesity behavioral modification therapy today.  ASSESS: Danny Hinton has the diagnosis of obesity and his BMI today is 56.6. Danny Hinton is in the action stage of change.   ADVISE: Danny Hinton on  the multiple health risks of obesity as well as the benefit of weight loss to improve his health. He was advised of the need for long term treatment and the importance of lifestyle modifications to improve his current health and to decrease his risk of future health problems.  AGREE: Multiple dietary modification options and treatment options were discussed and Danny Hinton to follow the recommendations documented in the above note.  ARRANGE: Danny Hinton on the importance of frequent visits to treat obesity as outlined per CMS and USPSTF guidelines and Hinton to schedule his next follow up appointment today.  Attestation Statements:   Reviewed by clinician on day of visit: allergies, medications, problem list, medical history, surgical history, family history, social history, and previous encounter notes.  Danny Hinton, Danny Hinton, Danny Hinton, am acting as Insurance claims handler for [employer], DO.  Danny Hinton have reviewed the above documentation for accuracy and completeness, and Danny Hinton agree with the above. [employer], DO

## 2019-09-17 ENCOUNTER — Encounter: Payer: Self-pay | Admitting: Cardiovascular Disease

## 2019-09-17 ENCOUNTER — Other Ambulatory Visit: Payer: Self-pay

## 2019-09-17 ENCOUNTER — Ambulatory Visit (INDEPENDENT_AMBULATORY_CARE_PROVIDER_SITE_OTHER): Payer: Medicaid Other | Admitting: Cardiovascular Disease

## 2019-09-17 VITALS — BP 109/61 | HR 82 | Ht 77.0 in | Wt >= 6400 oz

## 2019-09-17 DIAGNOSIS — I5042 Chronic combined systolic (congestive) and diastolic (congestive) heart failure: Secondary | ICD-10-CM | POA: Diagnosis not present

## 2019-09-17 DIAGNOSIS — I428 Other cardiomyopathies: Secondary | ICD-10-CM | POA: Diagnosis not present

## 2019-09-17 DIAGNOSIS — E669 Obesity, unspecified: Secondary | ICD-10-CM | POA: Diagnosis not present

## 2019-09-17 DIAGNOSIS — I471 Supraventricular tachycardia: Secondary | ICD-10-CM | POA: Diagnosis not present

## 2019-09-17 DIAGNOSIS — Z7901 Long term (current) use of anticoagulants: Secondary | ICD-10-CM

## 2019-09-17 DIAGNOSIS — I480 Paroxysmal atrial fibrillation: Secondary | ICD-10-CM

## 2019-09-17 DIAGNOSIS — E785 Hyperlipidemia, unspecified: Secondary | ICD-10-CM | POA: Diagnosis not present

## 2019-09-17 DIAGNOSIS — E1169 Type 2 diabetes mellitus with other specified complication: Secondary | ICD-10-CM

## 2019-09-17 NOTE — Progress Notes (Signed)
Patient ID: Danny Hinton, male   DOB: 10-11-1980, 39 y.o.   MRN: [id number]    Cardiology Office Note    Date:  09/19/2019   ID:  Danny E Batty, DOB 15-Jul-1980, MRN [id number]  PCP:  Dettinger, Fransisca Kaufmann, MD  Cardiologist:   Sanda Klein, MD   Chief Complaint  Patient presents with  . Congestive Heart Failure    History of Present Illness:  Danny E Pressman is a 39 y.o. male who presents for nonischemic cardiomyopathy, chronic systolic heart failure, defibrillator checkup, history of atrial flutter, atrial fibrillation and paroxysmal atrial tachycardia.  He has not had any major setbacks since his last appointment but continues to have relatively poor functional status (NYHA class III).  He can walk slowly on flat terrain but is unable to walk up ramps or stairs.  He often becomes dizzy when he stands up and gets short of breath when he bends over.  He has not had any overt edema.  The palpitations have not bothered him in quite a while.  He has not had syncope or near syncope.  He has not had any falls, injuries or serious bleeding problems.  His major complaint is abdominal bloating.  He has gained 10 pounds since his last appointment.  Unclear whether this is fluid.  His thoracic impedance ([place]) does not show evidence of fluid overload.    Device interrogation shows normal findings.  Estimated generator longevity is only another 6 months.  His Saint Jude fortify ICD was implanted in 2011 (the generator is under advisory for possible unexpected battery depletion, but has shown normal behavior).  He has not had any atrial pacing or ventricular pacing and has not had recent episodes of mode switch.  Heart rate histogram distribution is consistent with sedentary lifestyle.  As mentioned, his thoracic impedance shows no evidence of fluid overload since last November.  He has enrolled in an intensive weight loss program.  He is more and more eager to undergo bariatric surgery.  His 38 year old  daughter who is an excellent student is worried about going back to school since she may expose her parents to coronavirus.  Danny has severe heart disease and his wife has multiple sclerosis on immunosuppressant drugs.  His September 2019 echocardiogram showed an ejection fraction of around 40%.  In September 2015 he underwent cavotricuspid isthmus ablation for atrial flutter. On 08/19/2014, while [place] hunting he develop persistent rapid palpitations and felt unwell. He underwent successful overdrive pacing via his device by Dr. Thompson Grayer. The rhythm was atypical atrial flutter, cycle length roughly 390 ms. The episode lasted for about 3 hours until he was successfully overdrive paced. In the past he has had paroxysmal atrial fibrillation. The decision was made to ablate his flutter secondary to the occurrence of multiple unnecessary defibrillator shocks in the setting of atrial flutter with rapid ventricular rate.  His Rosenberg [phone] defibrillator is under advisory for possible unexpected battery depletion. He is not device dependent and has never had shocks for true ventricular arrhythmia (although he has had inappropriate shocks for atrial flutter with rapid AV conduction). We discussed the potential for device failure in a low percentage of this particular model. There is no reason to proceed with early generator change out in his particular case.   Past Medical History:  Diagnosis Date  . Anxiety   . Atypical atrial flutter ([place]) 08/19/2014   Pace terminated in device clinic.  AFL cycle length was 390 msec.  Marland Kitchen  Automatic implantable cardioverter-defibrillator in situ    a. St Jude in 2011.  . B12 nutritional deficiency, B12 = 214 (03/18/19) 04/29/2019  . Chronic anticoagulation with Coumadin   . Chronic systolic congestive heart failure (HCC)    a. suspected NICM - dx 2010. EF 15% by TEE, 10-20% by echo at that time. b. s/p St. Jude AICD 2011. c. Echo 01/2010: mod  dilated LV, EF 30%, mod aortic root dilitation, no significant valvular disease.  . Depression   . Diabetes mellitus (HCC), Rx Victoza 04/29/2019  . Dyslipidemia associated with type 2 diabetes mellitus (HCC), Rx Lipitor 04/29/2019  . Eczema   . Enlarged aorta (HCC)   . Hypertension associated with diabetes (HCC)   . Mobitz type 2 second degree atrioventricular block    a. During sleeping hours in 2010 suspected due to OSA.  . Morbid obesity (HCC)   . Nonischemic cardiomyopathy (HCC)   . OSA treated with BiPAP 09/04/2018   Severe obstructive sleep apnea with an AHI of 67.2/h and nocturnal hypoxemia with oxygen saturations as low as 82%. Now on BIPAP at 18/14 cm H2O.  Marland Kitchen PAF (paroxysmal atrial fibrillation) (HCC)   . Palpitations   . Paroxysmal atrial flutter (HCC), s/p ablation   . Seasonal allergies   . Sleep apnea   . Thoracic aortic aneurysm (HCC), monitored annually 04/29/2019  . Vitamin D deficiency, vitamin D = 6.9 (03/28/19) 04/29/2019    Past Surgical History:  Procedure Laterality Date  . ATRIAL FLUTTER ABLATION N/A 01/18/2014   Procedure: ATRIAL FLUTTER ABLATION;  Surgeon: Duke Salvia, MD;  Location: [facility] CATH LAB;  Service: Cardiovascular;  Laterality: N/A;  . CARDIAC DEFIBRILLATOR PLACEMENT  02/17/10   St. Jude Medical 45DR, model number [id number], serial number [id number]  . CARDIOVERSION N/A 12/29/2013   Procedure: CARDIOVERSION;  Surgeon: Duke Salvia, MD;  Location: [facility] OR;  Service: Cardiovascular;  Laterality: N/A;  . TOOTH EXTRACTION N/A 10/23/2012   Procedure: EXTRACTION TEETH 1, 16, 17, 30, 31;  Surgeon: [name], DDS;  Location: [facility] OR;  Service: Oral Surgery;  Laterality: N/A;    Outpatient Medications Prior to Visit  Medication Sig Dispense Refill  . ALPRAZolam (XANAX) 1 MG tablet Take 1 tablet (1 mg total) by mouth 3 (three) times daily as needed for anxiety. 30 tablet 0  . atorvastatin (LIPITOR) 40 MG tablet Take 1 tablet (40 mg total) by mouth daily  with breakfast. (Needs visit with PCP) 90 tablet 3  . buPROPion (WELLBUTRIN XL) 150 MG 24 hr tablet Take 1 tablet (150 mg total) by mouth daily. 90 tablet 3  . carvedilol (COREG) 25 MG tablet Take 1.5 tablets (37.5 mg total) by mouth 2 (two) times daily with a meal. 270 tablet 3  . digoxin (LANOXIN) 0.125 MG tablet Take 1 tablet (125 mcg total) by mouth every other day. 45 tablet 3  . FLUoxetine (PROZAC) 20 MG tablet Take 1 tablet (20 mg total) by mouth daily. 30 tablet 3  . fluticasone (FLONASE) 50 MCG/ACT nasal spray Place 1 spray into both nostrils as needed. 16 g 11  . furosemide (LASIX) 20 MG tablet Take 1 tablet (20 mg total) by mouth every other day. 15 tablet 5  . Insulin Pen Needle (BD PEN NEEDLE NANO 2ND GEN) 32G X 4 MM MISC 1 Package by Does not apply route 2 (two) times daily. 100 each 0  . loperamide (IMODIUM A-D) 2 MG tablet Take as needed 30 tablet 0  . loratadine (CLARITIN)  10 MG tablet Take 1 tablet (10 mg total) by mouth daily. Patient needs to be seen before next refill. 30 tablet 0  . metoprolol tartrate (LOPRESSOR) 25 MG tablet Take 1 tablet as needed for palpitations 90 tablet 3  . ondansetron (ZOFRAN) 4 MG tablet Take 1 tablet (4 mg total) by mouth every 8 (eight) hours as needed for nausea or vomiting. 20 tablet 0  . Probiotic Product (VSL#3) CAPS Take twice a day    . sacubitril-valsartan (ENTRESTO) 49-51 MG Take 1 tablet by mouth 2 (two) times daily. 180 tablet 1  . VICTOZA 18 MG/3ML SOPN INJECT 0.1MLS (0.6 MG TOTAL) INTO THE SKIN EVERY MORNING 9 mL 0  . Vitamin D, Ergocalciferol, (DRISDOL) 1.25 MG (50000 UNIT) CAPS capsule Take 1 capsule (50,000 Units total) by mouth every 3 (three) days. 8 capsule 0  . warfarin (COUMADIN) 5 MG tablet TAKE 1 TABLET BY MOUTH ONCE DAILY EXCEPT  TAKE  1  &  1/2  TABLET  EACH  FRIDAY  OR  AS  DIRECTED  BY  ANTICOAGULATION  CLINIC 100 tablet 3   No facility-administered medications prior to visit.     Allergies:   Patient has no known  allergies.   Social History   Socioeconomic History  . Marital status: Married    Spouse name: Not on file  . Number of children: 1  . Years of education: Not on file  . Highest education level: Not on file  Occupational History  . Occupation: disabled  Tobacco Use  . Smoking status: Never Smoker  . Smokeless tobacco: Never Used  Substance and Sexual Activity  . Alcohol use: No  . Drug use: No  . Sexual activity: Yes  Other Topics Concern  . Not on file  Social History Narrative  . Not on file   Social Determinants of Health   Financial Resource Strain:   . Difficulty of Paying Living Expenses:   Food Insecurity:   . Worried About Programme researcher, broadcasting/film/video in the Last Year:   . Barista in the Last Year:   Transportation Needs:   . Freight forwarder (Medical):   Marland Kitchen Lack of Transportation (Non-Medical):   Physical Activity:   . Days of Exercise per Week:   . Minutes of Exercise per Session:   Stress:   . Feeling of Stress :   Social Connections:   . Frequency of Communication with Friends and Family:   . Frequency of Social Gatherings with Friends and Family:   . Attends Religious Services:   . Active Member of Clubs or Organizations:   . Attends Banker Meetings:   Marland Kitchen Marital Status:      Family History:  The patient's family history includes Anxiety disorder in his mother; Arrhythmia in his mother; Colon polyps in his father; Depression in his mother; Diabetes in his paternal grandmother; Heart disease in his maternal grandfather and mother; Hodgkin's lymphoma (age of onset: 57) in his brother; Irritable bowel syndrome in his maternal aunt.   ROS:   Please see the history of present illness.    ROS All other systems are reviewed and are negative.   PHYSICAL EXAM:   VS:  BP 109/61   Pulse 82   Ht 6\' 5"  (1.956 m)   Wt (!) 460 lb (208.7 kg)   SpO2 97%   BMI 54.55 kg/m      General: Alert, oriented x3, no distress, morbidly obese.   Healthy with  subclavian defibrillator site. Head: no evidence of trauma, PERRL, EOMI, no exophtalmos or lid lag, no myxedema, no xanthelasma; normal ears, nose and oropharynx Neck: normal jugular venous pulsations and no hepatojugular reflux; brisk carotid pulses without delay and no carotid bruits Chest: clear to auscultation, no signs of consolidation by percussion or palpation, normal fremitus, symmetrical and full respiratory excursions Cardiovascular: normal position and quality of the apical impulse, regular rhythm, normal first and second heart sounds, no murmurs, rubs or gallops Abdomen: no tenderness or distention, no masses by palpation, no abnormal pulsatility or arterial bruits, normal bowel sounds, no hepatosplenomegaly Extremities: no clubbing, cyanosis or edema; 2+ radial, ulnar and brachial pulses bilaterally; 2+ right femoral, posterior tibial and dorsalis pedis pulses; 2+ left femoral, posterior tibial and dorsalis pedis pulses; no subclavian or femoral bruits Neurological: grossly nonfocal Psych: Normal mood and affect   Wt Readings from Last 3 Encounters:  09/17/19 (!) 460 lb (208.7 kg)  09/11/19 (!) 452 lb (205 kg)  08/20/19 (!) 451 lb (204.6 kg)      Studies/Labs Reviewed:   ECHO 01/27/2018: - Left ventricle: The cavity size was mildly dilated. There was mild concentric hypertrophy. Systolic function was normal. The estimated ejection fraction was 40%. Wall motion was normal; there were no regional wall motion abnormalities. Doppler parameters are consistent with abnormal left ventricular relaxation (grade 1 diastolic dysfunction). There was no evidence of elevated ventricular filling pressure by Doppler parameters. - Aortic valve: There was no regurgitation. - Aortic root: The aortic root was normal in size. - Mitral valve: There was mild regurgitation. Valve area by pressure half-time: 2.22 cm^2. - Left atrium: The atrium was mildly dilated. - Right ventricle: Pacer  wire or catheter noted in right ventricle. Systolic function was normal. - Right atrium: Pacer wire or catheter noted in right atrium. - Tricuspid valve: There was mild regurgitation. - Pulmonic valve: There was mild regurgitation. - Inferior vena cava: The vessel was normal in size. - Pericardium, extracardiac: There was no pericardial effusion.  Impressions:  - There has been no significant change since the prior study on 07/31/2015, LVEF remains moderately decreased estimated at 40%.   Aortic root is dilated at the sinus level with maximum diameter 45 mm. Ascending aortic size is stable at 40 mm.  EKG:  EKG from 03/28/2019 shows NSR Recent Labs: 03/28/2019: TSH 2.530 07/30/2019: ALT 27; BUN 12; Creatinine, Ser 0.86; Hemoglobin 11.8; Platelets 261; Potassium 4.2; Sodium 137   Lipid Panel    Component Value Date/Time   CHOL 133 07/30/2019 1131   TRIG 86 07/30/2019 1131   HDL 40 07/30/2019 1131   CHOLHDL 3.3 07/30/2019 1131   CHOLHDL 5.3 03/20/2009 0410   VLDL 22 03/20/2009 0410   LDLCALC 76 07/30/2019 1131   LDLDIRECT 52 03/26/2015 0824     ASSESSMENT:    1. Chronic combined systolic and diastolic heart failure (HCC)   2. Paroxysmal atrial fibrillation (HCC)   3. Nonischemic cardiomyopathy (HCC)   4. PAT (paroxysmal atrial tachycardia) (HCC)   5. Long term current use of anticoagulant therapy, Coumadin   6. Super obesity   7. Dyslipidemia (high LDL; low HDL)   8. Diabetes mellitus type 2 in obese [facility])      PLAN:  In order of problems listed above:  1. CHF: NYHA functional class III.  We had to decrease his diuretic dose due to problems with orthostatic hypotension and he is doing better.  He is on highest tolerated doses of carvedilol and Entresto  as well as digoxin. 2. CMP: Nonischemic mechanism.  Etiology unclear.  Most recent LV ejection fraction 40%.   3. AFib: with history of previous cavotricuspid isthmus flutter ablation and subsequent episodes of symptomatic  paroxysmal atrial tachycardia with 1: 1 AV conduction.  No recent arrhythmia recurrence. CHADSVasc 2 (CHF, HTN).   On appropriate anticoagulation. 4. Warfarin:  Has a home monitor, followed by PCP.  No bleeding complications. 5. PAT/atypical flutter: Remarkable reduction in arrhythmia burden over the last 6 months.  No significant episodes since October 2020. 6. AICD advisory: Anticipates need for generator change out before the end of the year.  Device function is normal.  His St Jude device is under advisory for potential rapid battery depletion, but it in his particular case this is unlikely to be a serious issue.  He is not pacemaker dependent and has not required appropriate defibrillator therapy for ventricular tachyarrhythmia. Will continue with frequent battery checks, but no plan for early generator change out at this time. 7. Obesity: His insurance coverage did not allow enrollment in the bariatric surgery program at [facility], but he is apparently accepted for surgical evaluation at [facility]. 8. HLP: Borderline low HDL, but otherwise with excellent lipid parameters. 9. DM: Most recent hemoglobin A1c 7.6%.  If lifestyle changes do not bring his A1c back under 7% I would strongly recommend using an SGLT2 inhibitor such as Jardiance or Farxiga, since these may be beneficial for heart failure outcomes.  If these medications are started I recommend stopping his loop diuretics completely. 10. HTN: BP remains relatively low.  Occasional orthostatic dizziness, but improved since last appointment.  Medication Adjustments/Labs and Tests Ordered: Current medicines are reviewed at length with the patient today.  Concerns regarding medicines are outlined above.  Medication changes, Labs and Tests ordered today are listed in the Patient Instructions below. Patient Instructions  Medication Instructions:  No changes *If you need a refill on your cardiac medications before your next  appointment, please call your pharmacy*   Lab Work: None ordered If you have labs (blood work) drawn today and your tests are completely normal, you will receive your results only by: Marland Kitchen MyChart Message (if you have MyChart) OR . A paper copy in the mail If you have any lab test that is abnormal or we need to change your treatment, we will call you to review the results.   Testing/Procedures: None ordered   Follow-Up: At [facility], you and your health needs are our priority.  As part of our continuing mission to provide you with exceptional heart care, we have created designated Provider Care Teams.  These Care Teams include your primary Cardiologist (physician) and Advanced Practice Providers (APPs -  Physician Assistants and Nurse Practitioners) who all work together to provide you with the care you need, when you need it.  We recommend signing up for the patient portal called "MyChart".  Sign up information is provided on this After Visit Summary.  MyChart is used to connect with patients for Virtual Visits (Telemedicine).  Patients are able to view lab/test results, encounter notes, upcoming appointments, etc.  Non-urgent messages can be sent to your provider as well.   To learn more about what you can do with MyChart, go to [email or web address].    Your next appointment:   6 month(s)  The format for your next appointment:   In Person  Provider:   Thurmon Fair, MD        Signed, Thurmon Fair, MD  09/19/2019 2:39 PM    [facility] [address] [place], [place], [place]  [address] Phone: [phone]; Fax: [phone]

## 2019-09-17 NOTE — Patient Instructions (Signed)

## 2019-09-19 ENCOUNTER — Telehealth: Payer: Self-pay

## 2019-09-19 ENCOUNTER — Encounter: Payer: Self-pay | Admitting: Cardiovascular Disease

## 2019-09-19 NOTE — Telephone Encounter (Signed)
Left message for patient to remind of missed remote transmission.  

## 2019-09-26 ENCOUNTER — Ambulatory Visit
Admission: RE | Admit: 2019-09-26 | Discharge: 2019-09-26 | Disposition: A | Discharge status: Home / Self Care | Payer: Medicaid Other | Source: Ambulatory Visit | Attending: Surgery | Admitting: Surgery

## 2019-09-26 ENCOUNTER — Other Ambulatory Visit: Payer: Self-pay

## 2019-09-26 ENCOUNTER — Encounter: Payer: Self-pay | Admitting: Surgery

## 2019-09-26 ENCOUNTER — Ambulatory Visit: Payer: Medicaid Other | Admitting: Surgery

## 2019-09-26 VITALS — BP 116/73 | HR 71 | Temp 97.0°F | Resp 16 | Ht 77.0 in | Wt >= 6400 oz

## 2019-09-26 DIAGNOSIS — I712 Thoracic aortic aneurysm, without rupture, unspecified: Secondary | ICD-10-CM

## 2019-09-26 IMAGING — CT CT ANGIO CHEST
2 of 6 series · 13 of 36 positions shown · IV contrast (iopamidol)
Comparison: 07/20/2018

CLINICAL DATA: Follow-up thoracic aortic aneurysm

EXAM:
CT ANGIOGRAPHY CHEST WITH CONTRAST
TECHNIQUE: Multidetector CT imaging of the chest was performed using the
standard protocol during bolus administration of intravenous
contrast. Multiplanar CT image reconstructions and MIPs were
obtained to evaluate the vascular anatomy.
CONTRAST:  75mL [id number] IOPAMIDOL ([id number]) INJECTION 76%
Creatinine was obtained on site at [HOSPITAL] at [REDACTED].
Results: Creatinine 0.7 mg/dL.  GFR equals 139

[Series 6: cta thorax 2.00 bv36 s3 axial arterial · axial · arterial · 0.83mm/px · z∈[+1664,+1948]mm · 12 of 168 slices shown]
[im 13/168  lung]
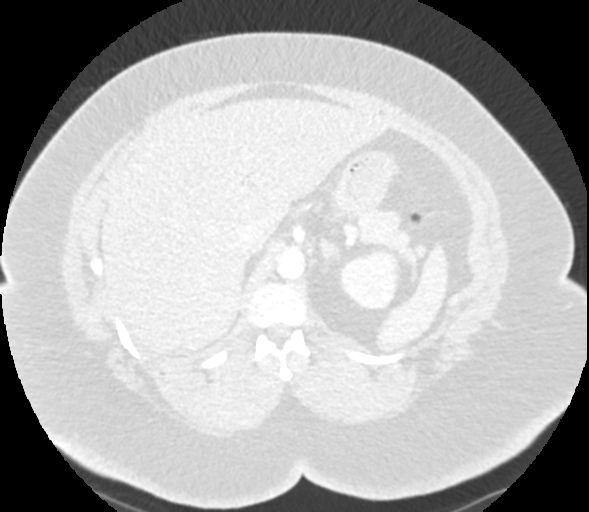
[im 26/168  mediastinal]
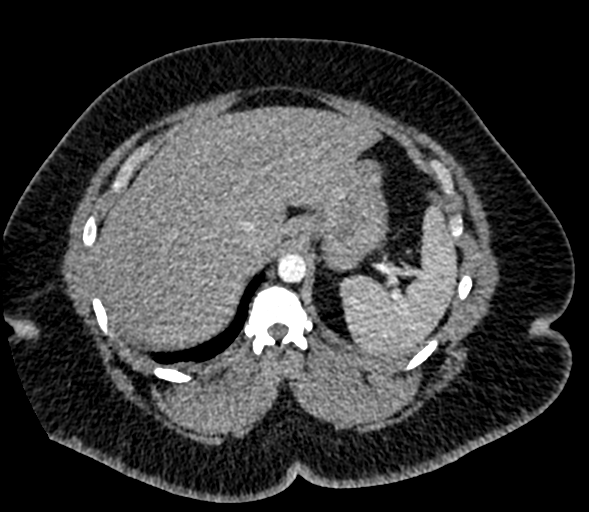
[im 39/168  lung]
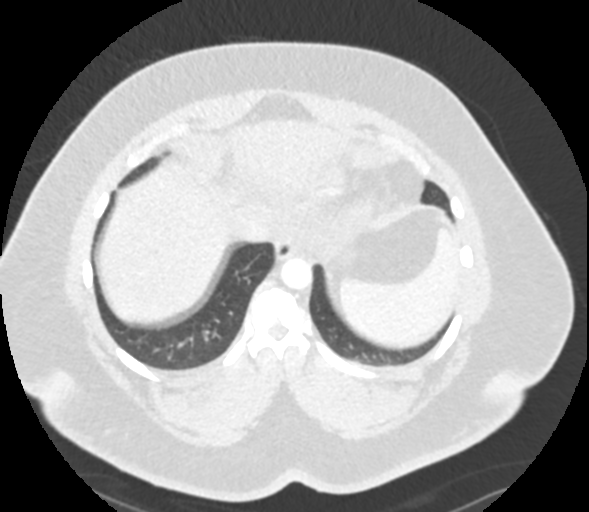
[im 52/168  mediastinal]
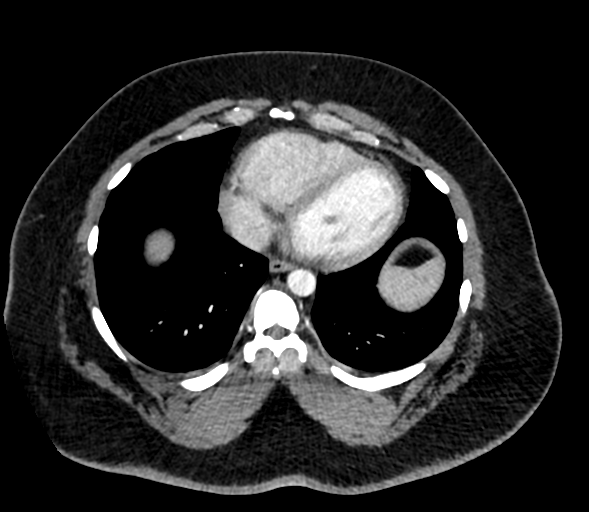
[im 65/168  lung]
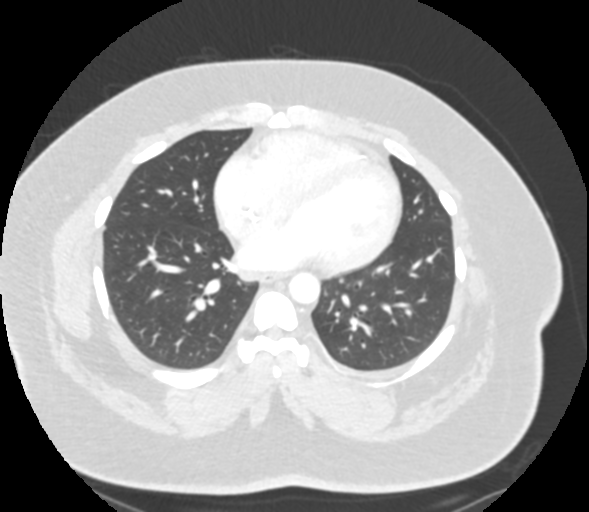
[im 78/168  mediastinal]
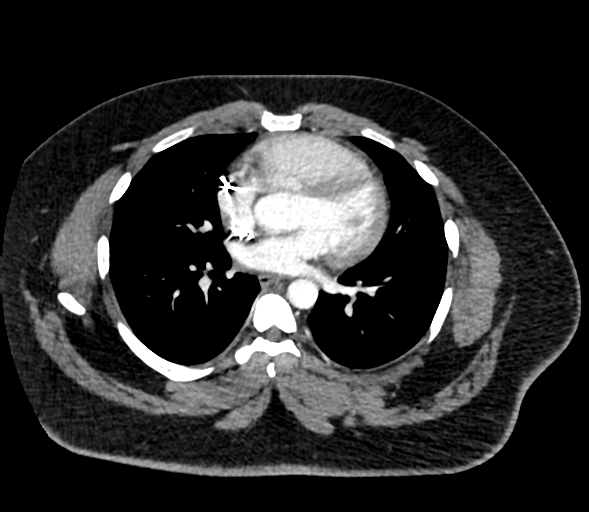
[im 90/168  lung]
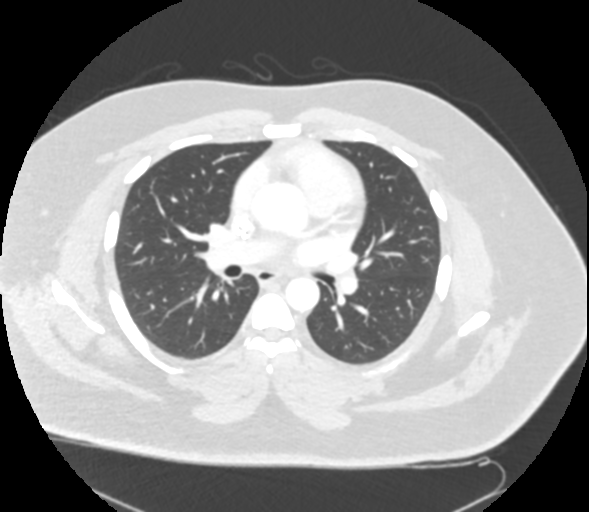
[im 103/168  mediastinal]
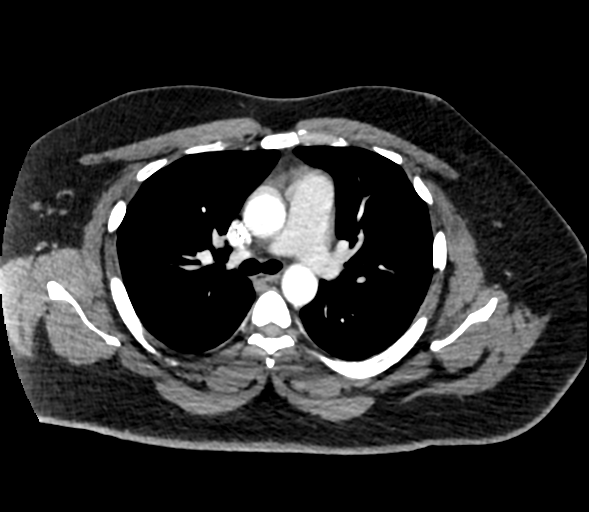
[im 116/168  lung]
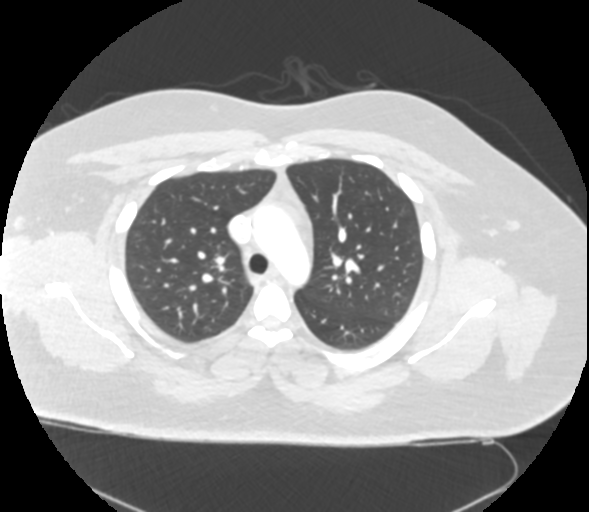
[im 129/168  mediastinal]
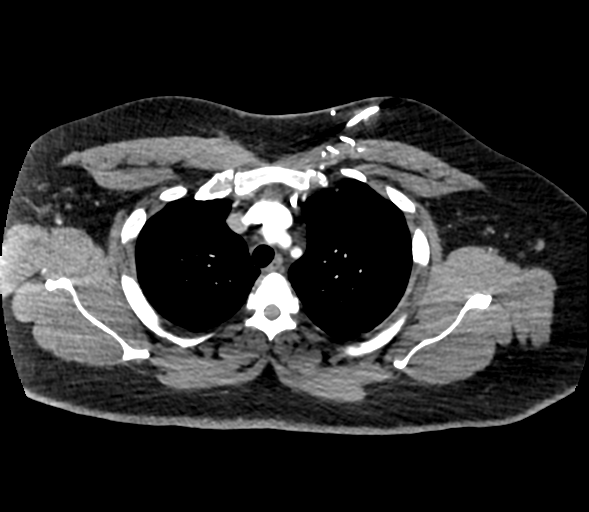
[im 142/168  lung]
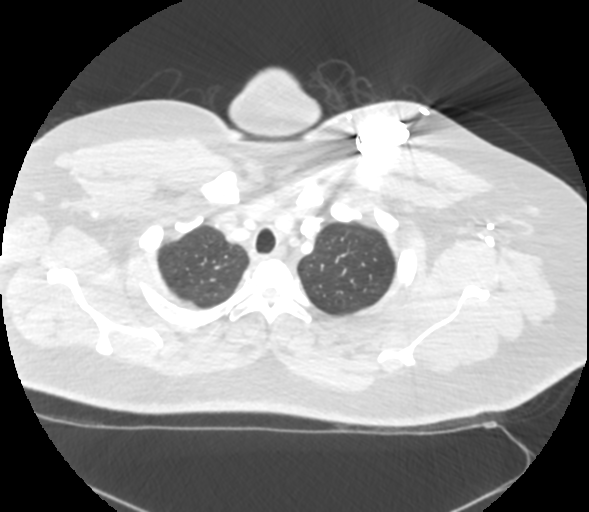
[im 155/168  mediastinal]
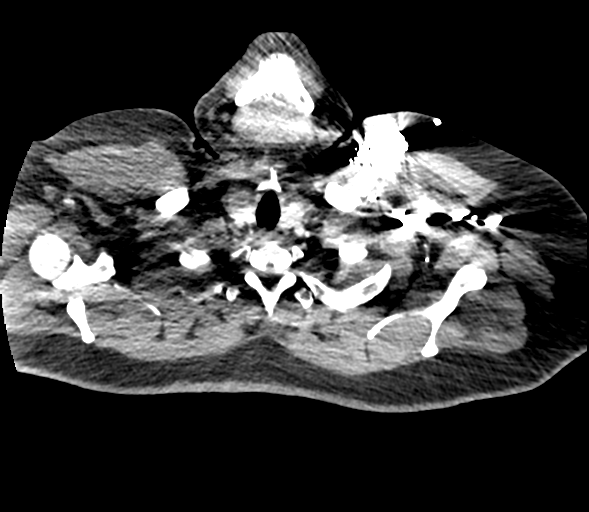

[Series 11: cta thorax 2.00 bv36 s3 cor st · coronal · 0.66mm/px · 1 of 213 slices shown]
[im 107/213  mediastinal]
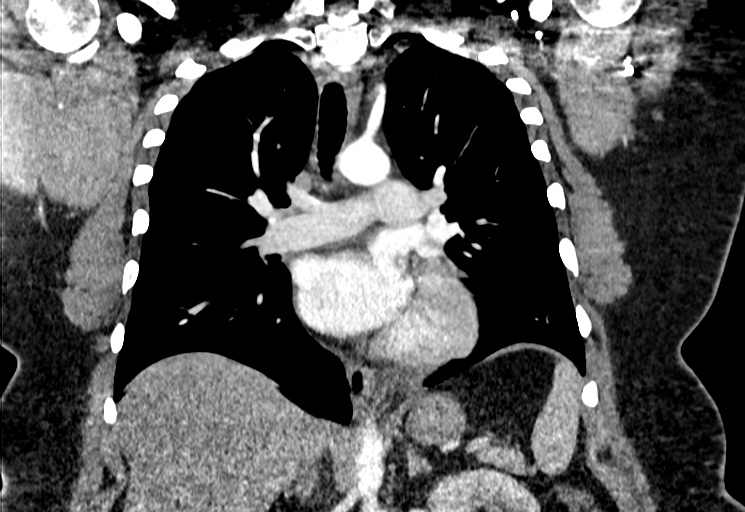

[13 of 36 positions shown; findings below may reference images not displayed]

FINDINGS: Cardiovascular: Pulmonary artery is incompletely evaluated although
no large central pulmonary embolus is noted. Pacemaker device is
again noted and stable. No significant cardiac enlargement is noted.
No significant coronary calcifications are identified.

Aortic measurements: 5 cm sinus of Valsalva

Kamala junction: 3.6 cm.

Ascending aorta: 4.0 cm.

Normal tapering is noted within the thoracic aortic arch. The
descending thoracic aorta shows no dissection or aneurysmal
dilatation. Truncus anomaly is again noted.

Mediastinum/Nodes: Thoracic inlet is within normal limits. No
sizable hilar or mediastinal adenopathy is noted. The esophagus as
visualized is within normal limits.

Lungs/Pleura: The lungs are well aerated bilaterally. No focal
infiltrate or sizable effusion is seen.

Upper Abdomen: Liver is diffusely decreased in attenuation
consistent with fatty infiltration. Remainder of the visualized
upper abdomen shows no focal abnormality.

Musculoskeletal: Mild degenerative changes of the thoracic spine are
seen. No acute bony abnormality is noted.

Review of the MIP images confirms the above findings
IMPRESSION: Stable appearance of the ascending aorta with dilatation at the
level of the sinus of Valsalva and ascending portion. Recommend
annual imaging followup by CTA or MRA. This recommendation follows
2252 ACCF/AHA/AATS/ACR/ASA/SCA/MUCHAMAD/HEY/EBADAT/GIZELLE Guidelines for the
Diagnosis and Management of Patients with Thoracic Aortic Disease.
Circulation. 2252; 121: [id number]. Aortic aneurysm NOS ([id number].J)

No other focal abnormality is noted.

## 2019-09-26 MED ORDER — IOPAMIDOL (ISOVUE-370) INJECTION 76%
75.0000 mL | Freq: Once | INTRAVENOUS | Status: AC | PRN
  Administered 2019-09-26: 75 mL via INTRAVENOUS

## 2019-09-26 NOTE — Progress Notes (Signed)
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  He had a CTA of the chest in March 2017 that showed the aortic root to be 4.4 cm at the sinus level.  I saw him for initial consultation last May and the aorta was 5 cm at the same region.  Visualization of the aortic root was not felt to be ideal due to his super morbid obesity which may cause some variation in the measurements.  He had a 2D echocardiogram in September 2019 and a sinus diameter was 4.5 cm at that time.  Since I saw him last year he has been doing well.  He said that he has gained some weight and is now 460 pounds.  He said that he is scheduled to have bariatric surgery in [place] in the near future.  He denies any chest or back pain.  He is supposed to have his ICD battery changed in about 5 months.  Current Outpatient Medications  Medication Sig Dispense Refill  . ALPRAZolam (XANAX) 1 MG tablet Take 1 tablet (1 mg total) by mouth 3 (three) times daily as needed for anxiety. 30 tablet 0  . atorvastatin (LIPITOR) 40 MG tablet Take 1 tablet (40 mg total) by mouth daily with breakfast. (Needs visit with PCP) 90 tablet 3  . buPROPion (WELLBUTRIN XL) 150 MG 24 hr tablet Take 1 tablet (150 mg total) by mouth daily. 90 tablet 3  . carvedilol (COREG) 25 MG tablet Take 1.5 tablets (37.5 mg total) by mouth 2 (two) times daily with a meal. 270 tablet 3  . digoxin (LANOXIN) 0.125 MG tablet Take 1 tablet (125 mcg total) by mouth every other day. 45 tablet 3  . FLUoxetine (PROZAC) 20 MG tablet Take 1 tablet (20 mg total) by mouth daily. 30 tablet 3  . fluticasone (FLONASE) 50 MCG/ACT nasal spray Place 1 spray into both nostrils as needed. 16 g 11  . furosemide (LASIX) 20 MG tablet Take 1 tablet (20 mg total) by mouth every other day. 15 tablet 5  . Insulin Pen Needle (BD PEN NEEDLE NANO 2ND GEN) 32G X 4 MM MISC 1 Package by Does not apply route 2 (two) times  daily. 100 each 0  . loperamide (IMODIUM A-D) 2 MG tablet Take as needed 30 tablet 0  . loratadine (CLARITIN) 10 MG tablet Take 1 tablet (10 mg total) by mouth daily. Patient needs to be seen before next refill. 30 tablet 0  . metoprolol tartrate (LOPRESSOR) 25 MG tablet Take 1 tablet as needed for palpitations 90 tablet 3  . ondansetron (ZOFRAN) 4 MG tablet Take 1 tablet (4 mg total) by mouth every 8 (eight) hours as needed for nausea or vomiting. 20 tablet 0  . Probiotic Product (VSL#3) CAPS Take twice a day    . sacubitril-valsartan (ENTRESTO) 49-51 MG Take 1 tablet by mouth 2 (two) times daily. 180 tablet 1  . VICTOZA 18 MG/3ML SOPN INJECT 0.1MLS (0.6 MG TOTAL) INTO THE SKIN EVERY MORNING 9 mL 0  . Vitamin D, Ergocalciferol, (DRISDOL) 1.25 MG (50000 UNIT) CAPS capsule Take 1 capsule (50,000 Units total) by mouth every 3 (three) days. 8 capsule 0  . warfarin (COUMADIN) 5 MG tablet TAKE 1 TABLET BY MOUTH ONCE DAILY EXCEPT  TAKE  1  &  1/2  TABLET  EACH  FRIDAY  OR  AS  DIRECTED  BY  ANTICOAGULATION  CLINIC 100 tablet 3   No current facility-administered medications for this visit.     Physical Exam: BP 116/73 (BP Location: Left Arm, Patient Position: Sitting, Cuff Size: Normal) Comment (BP Location): lower arm  Pulse 71   Temp (!) 97 F (36.1 C)   Resp 16   Ht 6\' 5"  (1.956 m)   Wt (!) 460 lb (208.7 kg)   SpO2 96% Comment: ON RA  BMI 54.55 kg/m  He looks well. Cardiac exam shows a regular rate and rhythm with normal heart sounds.  There is no murmur. Lungs are clear.  Diagnostic Tests:  CLINICAL DATA:  Follow-up thoracic aortic aneurysm  EXAM: CT ANGIOGRAPHY CHEST WITH CONTRAST  TECHNIQUE: Multidetector CT imaging of the chest was performed using the standard protocol during bolus administration of intravenous contrast. Multiplanar CT image reconstructions and MIPs were obtained to evaluate the vascular anatomy.  CONTRAST:  97mL ISOVUE-370 IOPAMIDOL (ISOVUE-370)  INJECTION 76%  Creatinine was obtained on site at [facility] at [address]  Results: Creatinine 0.7 mg/dL.  GFR equals 139  COMPARISON:  07/20/2018  FINDINGS: Cardiovascular: Pulmonary artery is incompletely evaluated although no large central pulmonary embolus is noted. Pacemaker device is again noted and stable. No significant cardiac enlargement is noted. No significant coronary calcifications are identified.  Aortic measurements: 5 cm sinus of Valsalva  Sino-tubular junction: 3.6 cm.  Ascending aorta: 4.0 cm.  Normal tapering is noted within the thoracic aortic arch. The descending thoracic aorta shows no dissection or aneurysmal dilatation. 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  Mediastinum/Nodes: Thoracic inlet is within normal limits. No sizable hilar or mediastinal adenopathy is noted. The esophagus as visualized is within normal limits.  Lungs/Pleura: The lungs are well aerated bilaterally. No focal infiltrate or sizable effusion is seen.  Upper Abdomen: Liver is diffusely decreased in attenuation consistent with fatty infiltration. Remainder of the visualized upper abdomen shows no focal abnormality.  Musculoskeletal: Mild degenerative changes of the thoracic spine are seen. No acute bony abnormality is noted.  Review of the MIP images confirms the above findings  IMPRESSION: Stable appearance of the ascending aorta with dilatation at the level of the sinus of Valsalva and ascending portion. Recommend annual imaging followup by CTA or MRA. This recommendation follows 2010 ACCF/AHA/AATS/ACR/ASA/SCA/SCAI/SIR/STS/SVM Guidelines for the Diagnosis and Management of Patients with Thoracic Aortic Disease. Circulation. 2010; 1212011. Aortic aneurysm NOS (ICD10-I71.9)  No other focal abnormality is noted.   Electronically Signed   By: : [id number] M.D.   On: 09/26/2019 10:47  Impression:  He has a stable 5 cm aortic root  aneurysm with a normal trileaflet aortic valve with no regurgitation on prior echocardiogram.  He has no risk factors for aortic dissection and his blood pressure has been under good control.  The aorta is still below the surgical threshold and I think it would be best to continue following this for now given his multiple comorbid risk factors including super morbid obesity, obstructive sleep apnea, and nonischemic cardiomyopathy.  I reviewed the CTA images with him and answered his questions.  I stressed the importance of continued good blood pressure control and preventing further enlargement of the aorta and acute aortic dissection.  Plan:  I will see him back in 1 year with a CTA of the chest.  I spent 20 minutes performing this established patient evaluation and > 50% of this time was spent face to face counseling and coordinating the care of this patient's aortic aneurysm.  Gaye Pollack, MD [facility] [phone]

## 2019-10-10 ENCOUNTER — Other Ambulatory Visit: Payer: Self-pay | Admitting: Family Medicine

## 2019-10-10 ENCOUNTER — Encounter (INDEPENDENT_AMBULATORY_CARE_PROVIDER_SITE_OTHER): Payer: Self-pay | Admitting: Family Medicine

## 2019-10-10 ENCOUNTER — Ambulatory Visit (INDEPENDENT_AMBULATORY_CARE_PROVIDER_SITE_OTHER): Payer: Medicaid Other | Admitting: Family Medicine

## 2019-10-10 ENCOUNTER — Other Ambulatory Visit: Payer: Self-pay

## 2019-10-10 ENCOUNTER — Other Ambulatory Visit: Payer: Self-pay | Admitting: Cardiovascular Disease

## 2019-10-10 ENCOUNTER — Other Ambulatory Visit (INDEPENDENT_AMBULATORY_CARE_PROVIDER_SITE_OTHER): Payer: Self-pay | Admitting: Family Medicine

## 2019-10-10 DIAGNOSIS — E559 Vitamin D deficiency, unspecified: Secondary | ICD-10-CM

## 2019-10-10 DIAGNOSIS — I471 Supraventricular tachycardia: Secondary | ICD-10-CM

## 2019-10-10 DIAGNOSIS — E119 Type 2 diabetes mellitus without complications: Secondary | ICD-10-CM

## 2019-10-10 MED ORDER — LORATADINE 10 MG PO TABS: 10.0000 mg | ORAL_TABLET | Freq: Every day | ORAL | 0 refills | Status: DC

## 2019-10-15 ENCOUNTER — Other Ambulatory Visit (INDEPENDENT_AMBULATORY_CARE_PROVIDER_SITE_OTHER): Payer: Self-pay | Admitting: Family Medicine

## 2019-10-15 DIAGNOSIS — E119 Type 2 diabetes mellitus without complications: Secondary | ICD-10-CM

## 2019-10-17 ENCOUNTER — Encounter (INDEPENDENT_AMBULATORY_CARE_PROVIDER_SITE_OTHER): Payer: Self-pay | Admitting: Family Medicine

## 2019-10-18 ENCOUNTER — Encounter (INDEPENDENT_AMBULATORY_CARE_PROVIDER_SITE_OTHER): Payer: Self-pay | Admitting: Family Medicine

## 2019-10-18 DIAGNOSIS — E119 Type 2 diabetes mellitus without complications: Secondary | ICD-10-CM

## 2019-10-22 MED ORDER — VICTOZA 18 MG/3ML ~~LOC~~ SOPN: PEN_INJECTOR | SUBCUTANEOUS | 0 refills | Status: DC

## 2019-10-23 ENCOUNTER — Ambulatory Visit (INDEPENDENT_AMBULATORY_CARE_PROVIDER_SITE_OTHER): Payer: Medicaid Other | Admitting: Family Medicine

## 2019-10-23 ENCOUNTER — Other Ambulatory Visit: Payer: Self-pay

## 2019-10-23 ENCOUNTER — Encounter (INDEPENDENT_AMBULATORY_CARE_PROVIDER_SITE_OTHER): Payer: Self-pay | Admitting: Family Medicine

## 2019-10-23 VITALS — BP 89/57 | HR 59 | Temp 97.6°F | Ht 75.0 in | Wt >= 6400 oz

## 2019-10-23 DIAGNOSIS — Z6841 Body Mass Index (BMI) 40.0 and over, adult: Secondary | ICD-10-CM | POA: Diagnosis not present

## 2019-10-23 DIAGNOSIS — E119 Type 2 diabetes mellitus without complications: Secondary | ICD-10-CM

## 2019-10-23 DIAGNOSIS — E559 Vitamin D deficiency, unspecified: Secondary | ICD-10-CM

## 2019-10-23 DIAGNOSIS — I509 Heart failure, unspecified: Secondary | ICD-10-CM

## 2019-10-23 DIAGNOSIS — F3289 Other specified depressive episodes: Secondary | ICD-10-CM | POA: Diagnosis not present

## 2019-10-23 MED ORDER — VITAMIN D (ERGOCALCIFEROL) 1.25 MG (50000 UNIT) PO CAPS: 50000.0000 [IU] | ORAL_CAPSULE | ORAL | 0 refills | Status: AC

## 2019-10-23 MED ORDER — VICTOZA 18 MG/3ML ~~LOC~~ SOPN: 1.8000 mg | PEN_INJECTOR | Freq: Every day | SUBCUTANEOUS | 1 refills | Status: DC

## 2019-10-23 NOTE — Progress Notes (Signed)
Chief Complaint:   OBESITY Danny Hinton is here to discuss his progress with his obesity treatment plan along with follow-up of his obesity related diagnoses. Danny Hinton is on the Category 4 Plan and states he is following his eating plan approximately 25-30% of the time. Danny Hinton states he is exercising for 0 minutes 0 times per week.  Today's visit was #: 10 Starting weight: 452 lbs Starting date: 03/28/2019 Today's weight: 460 lbs Today's date: 10/23/2019 Total lbs lost to date: 0 Total lbs lost since last in-office visit: 0  Interim History: Danny Hinton has not had any Victoza for 1 week.  He has an appointment with [facility] on July 27th.  He says he is drinking protein shakes twice daily.  Eats 1 meal daily.  He says he is drinking water.  He drinks 3 ginger ales per week.  He says he has good habits and that his meals consist of mostly meats and vegetables.  No fried foods.  He got a treadmill and says he feels okay.  Subjective:   1. Type 2 diabetes mellitus without complication, without long-term current use of insulin (HCC) Medications reviewed. Diabetic ROS: no polyuria or polydipsia, no chest pain, dyspnea or TIA's, no numbness, tingling or pain in extremities.   Lab Results  Component Value Date   HGBA1C 7.6 (H) 07/30/2019   HGBA1C 7.8 (H) 03/28/2019   HGBA1C 7.0 (H) 12/13/2017   Lab Results  Component Value Date   LDLCALC 76 07/30/2019   CREATININE 0.86 07/30/2019   Lab Results  Component Value Date   INSULIN 99.3 (H) 03/28/2019   2. Vitamin D deficiency Henry's Vitamin D level was 19.7 on 07/30/2019. He is currently taking prescription vitamin D 50,000 IU each week. He denies nausea, vomiting or muscle weakness.  3. Congestive heart failure, unspecified HF chronicity, unspecified heart failure type (HCC) Danny Hinton is followed by Cardiology.  He needs his defibrillator battery replaced.  4. Other depression, with emotional eating Danny Hinton is taking Prozac and  Zoloft.  Assessment/Plan:   1. Type 2 diabetes mellitus without complication, without long-term current use of insulin (HCC) Good blood sugar control is important to decrease the likelihood of diabetic complications such as nephropathy, neuropathy, limb loss, blindness, coronary artery disease, and death. Intensive lifestyle modification including diet, exercise and weight loss are the first line of treatment for diabetes.   Orders - liraglutide (VICTOZA) 18 MG/3ML SOPN; Inject 0.3 mLs (1.8 mg total) into the skin daily.  Dispense: 9 mL; Refill: 1  2. Vitamin D deficiency Low Vitamin D level contributes to fatigue and are associated with obesity, breast, and colon cancer. He agrees to continue to take prescription Vitamin D @50 ,000 IU every week and will follow-up for routine testing of Vitamin D, at least 2-3 times per year to avoid over-replacement.  Orders - Vitamin D, Ergocalciferol, (DRISDOL) 1.25 MG (50000 UNIT) CAPS capsule; Take 1 capsule (50,000 Units total) by mouth every 3 (three) days.  Dispense: 12 capsule; Refill: 0  3. Congestive heart failure, unspecified HF chronicity, unspecified heart failure type (HCC) Will continue to monitor.  4. Other depression, with emotional eating will stop Zoloft and continue Prozac.  5. Class 3 severe obesity with serious comorbidity and body mass index (BMI) of 50.0 to 59.9 in adult, unspecified obesity type (HCC) Danny Hinton is currently in the action stage of change. As such, his goal is to continue with weight loss efforts. He has agreed to the Category 4 Plan.  Exercise goals: For substantial health benefits, adults should do at least 150 minutes (2 hours and 30 minutes) a week of moderate-intensity, or 75 minutes (1 hour and 15 minutes) a week of vigorous-intensity aerobic physical activity, or an equivalent combination of moderate- and vigorous-intensity aerobic activity. Aerobic activity should be performed in episodes of at least 10  minutes, and preferably, it should be spread throughout the week.  Behavioral modification strategies: increasing lean protein intake.  Danny Hinton has agreed to follow-up with our clinic in 2-3 weeks. He was informed of the importance of frequent follow-up visits to maximize his success with intensive lifestyle modifications for his multiple health conditions.   Objective:   Blood pressure (!) 89/57, pulse (!) 59, temperature 97.6 F (36.4 C), temperature source Oral, height 6\' 3"  (1.905 m), weight (!) 460 lb (208.7 kg), SpO2 96 %. Body mass index is 57.5 kg/m.  General: Cooperative, alert, well developed, in no acute distress. HEENT: Conjunctivae and lids unremarkable. Cardiovascular: Regular rhythm.  Lungs: Normal work of breathing. Neurologic: No focal deficits.   Lab Results  Component Value Date   CREATININE 0.86 07/30/2019   BUN 12 07/30/2019   NA 137 07/30/2019   K 4.2 07/30/2019   CL 99 07/30/2019   CO2 27 07/30/2019   Lab Results  Component Value Date   ALT 27 07/30/2019   AST 14 07/30/2019   ALKPHOS 81 07/30/2019   BILITOT 0.6 07/30/2019   Lab Results  Component Value Date   HGBA1C 7.6 (H) 07/30/2019   HGBA1C 7.8 (H) 03/28/2019   HGBA1C 7.0 (H) 12/13/2017   HGBA1C  03/19/2009    5.9 (NOTE) The ADA recommends the following therapeutic goal for glycemic control related to Hgb A1c measurement: Goal of therapy: <6.5 Hgb A1c  Reference: American Diabetes Association: Clinical Practice Recommendations 2010, Diabetes Care, 2010, 33: (Suppl  1).   Lab Results  Component Value Date   INSULIN 99.3 (H) 03/28/2019   Lab Results  Component Value Date   TSH 2.530 03/28/2019   Lab Results  Component Value Date   CHOL 133 07/30/2019   HDL 40 07/30/2019   LDLCALC 76 07/30/2019   LDLDIRECT 52 03/26/2015   TRIG 86 07/30/2019   CHOLHDL 3.3 07/30/2019   Lab Results  Component Value Date   WBC 4.4 07/30/2019   HGB 11.8 (L) 07/30/2019   HCT 36.8 (L) 07/30/2019   MCV 83  07/30/2019   PLT 261 07/30/2019   Lab Results  Component Value Date   IRON 68 07/30/2019   TIBC 259 07/30/2019   FERRITIN 148 07/30/2019   Obesity Behavioral Intervention:   Approximately 15 minutes were spent on the discussion below.  ASK: We discussed the diagnosis of obesity with Danny Hinton today and Danny Hinton agreed to give [place] permission to discuss obesity behavioral modification therapy today.  ASSESS: Danny Hinton has the diagnosis of obesity and his BMI today is 57.5. Danny Hinton is in the action stage of change.   ADVISE: Danny Hinton was educated on the multiple health risks of obesity as well as the benefit of weight loss to improve his health. He was advised of the need for long term treatment and the importance of lifestyle modifications to improve his current health and to decrease his risk of future health problems.  AGREE: Multiple dietary modification options and treatment options were discussed and Danny Hinton agreed to follow the recommendations documented in the above note.  ARRANGE: Danny Hinton was educated on the importance of frequent visits to treat obesity as outlined  per CMS and USPSTF guidelines and agreed to schedule his next follow up appointment today.  Attestation Statements:   Reviewed by clinician on day of visit: allergies, medications, problem list, medical history, surgical history, family history, social history, and previous encounter notes.  I, Insurance claims handler, CMA, am acting as transcriptionist for Helane Rima, DO  I have reviewed the above documentation for accuracy and completeness, and I agree with the above. Helane Rima, DO

## 2019-10-25 ENCOUNTER — Ambulatory Visit (INDEPENDENT_AMBULATORY_CARE_PROVIDER_SITE_OTHER): Payer: Medicaid Other | Admitting: Bariatrics

## 2019-11-02 ENCOUNTER — Ambulatory Visit (INDEPENDENT_AMBULATORY_CARE_PROVIDER_SITE_OTHER): Payer: Medicaid Other | Admitting: *Deleted

## 2019-11-02 ENCOUNTER — Telehealth: Payer: Self-pay | Admitting: Emergency Medicine

## 2019-11-02 DIAGNOSIS — I5022 Chronic systolic (congestive) heart failure: Secondary | ICD-10-CM

## 2019-11-02 DIAGNOSIS — I4719 Other supraventricular tachycardia: Secondary | ICD-10-CM

## 2019-11-02 DIAGNOSIS — I480 Paroxysmal atrial fibrillation: Secondary | ICD-10-CM

## 2019-11-02 DIAGNOSIS — I471 Supraventricular tachycardia: Secondary | ICD-10-CM

## 2019-11-02 LAB — CUP PACEART REMOTE DEVICE CHECK
Battery Remaining Longevity: 5 mo
Battery Remaining Percentage: 6 %
Battery Voltage: 2.63 V
Brady Statistic AP VP Percent: 1 %
Brady Statistic AP VS Percent: 1 %
Brady Statistic AS VP Percent: 1 %
Brady Statistic AS VS Percent: 99 %
Brady Statistic RA Percent Paced: 1 %
Brady Statistic RV Percent Paced: 1 %
Date Time Interrogation Session: 20210625145639
HighPow Impedance: 48 Ohm
Implantable Lead Implant Date: 20111010
Implantable Lead Implant Date: 20111010
Implantable Lead Location: 753859
Implantable Lead Location: 753860
Implantable Pulse Generator Implant Date: 20111010
Lead Channel Impedance Value: 440 Ohm
Lead Channel Impedance Value: 460 Ohm
Lead Channel Pacing Threshold Amplitude: 0.75 V
Lead Channel Pacing Threshold Amplitude: 1.75 V
Lead Channel Pacing Threshold Pulse Width: 0.5 ms
Lead Channel Pacing Threshold Pulse Width: 0.5 ms
Lead Channel Sensing Intrinsic Amplitude: 11.7 mV
Lead Channel Sensing Intrinsic Amplitude: 3.1 mV
Lead Channel Setting Pacing Amplitude: 2.5 V
Lead Channel Setting Pacing Amplitude: 3.5 V
Lead Channel Setting Pacing Pulse Width: 0.5 ms
Lead Channel Setting Sensing Sensitivity: 0.5 mV
Pulse Gen Serial Number: 621444

## 2019-11-02 NOTE — Telephone Encounter (Signed)
Dr Rubie Maid given repot on patient and orders received and read back.  Patient contacted and instructed to: 1. Hold LASIX unti diarrhea stops. 2. If he feels palpitations and his SBP is > 100 then take metoprolol 25 mg po  And call the office. 3. Will be receiving call to set up an appointment with an EP AP or Dr Rubie Maid in the next week. 4. Have labs drawn Monday 11/05/19 at [facility] office : CMP and magnesium  ED precautions given for CP, SOB, Chest pressure, dizziness, or syncope.

## 2019-11-02 NOTE — Telephone Encounter (Signed)
Received alert for transmission with 38 alerts that occurred frrom 8:53 am on 11/01/19 to 11/02/19 at  1456: 36 SVT, 2 AMS all with v-rates from 151 - 164 bpm. Patient reports he has felt palpitations over the past 2 days. He reports no CP, SOB, or syncope. He reports dizziness with position change and he reports his SBP dropping from 120s sitting to 90's when he stands up. He has nReports diarrhea x 1 week.He reports no missed doses of Coreg, digoxin, or Lasix. He did take a PRN dose of metoprolol 25 mg ordered 11/01/19 and felt better after that. He reports feeling tired and sluggish x 2 days and he has had diarrhea after his PCP changed one of his medications x 7 days. ED precautions given for SOB, syncope, CP , or worsening cardiac condition.

## 2019-11-05 ENCOUNTER — Telehealth: Payer: Self-pay

## 2019-11-05 ENCOUNTER — Other Ambulatory Visit: Payer: Self-pay | Admitting: *Deleted

## 2019-11-05 DIAGNOSIS — Z5181 Encounter for therapeutic drug level monitoring: Secondary | ICD-10-CM

## 2019-11-05 DIAGNOSIS — I471 Supraventricular tachycardia: Secondary | ICD-10-CM

## 2019-11-05 MED ORDER — CARVEDILOL 25 MG PO TABS: 50.0000 mg | ORAL_TABLET | Freq: Two times a day (BID) | ORAL | 0 refills | Status: DC

## 2019-11-05 NOTE — Telephone Encounter (Signed)
Merlin alert received 11/05/19 for Alert multiple SVT events as noted on remote 11/02/19. Continues to have events of SVT 150's. Presenting today ASVS 90's.          Spoke to patient, patient reports of feeling some improvement today. States he thinks he could feel an episode of palpitations earlier today. Patient reports of not taking any metoprolol since Saturday night.  Patient advised I will forward this information to Dr. Royann Shivers for follow-up. Patient given ED precautions, also advised him not to drive himself, to have someone drive him or call [phone]. Verbalizes understanding. Patient had labs checked today. Reminded of apt. To come into [place] for check 11/06/19 @ 10:55 to see A. Tillery, PA.

## 2019-11-05 NOTE — Progress Notes (Signed)
Remote ICD transmission.   

## 2019-11-06 ENCOUNTER — Ambulatory Visit: Payer: Medicaid Other | Admitting: Student

## 2019-11-06 ENCOUNTER — Encounter: Payer: Self-pay | Admitting: Student

## 2019-11-06 ENCOUNTER — Telehealth: Payer: Self-pay | Admitting: Student

## 2019-11-06 ENCOUNTER — Other Ambulatory Visit: Payer: Self-pay

## 2019-11-06 VITALS — BP 98/60 | HR 81 | Ht 75.0 in | Wt >= 6400 oz

## 2019-11-06 DIAGNOSIS — I5022 Chronic systolic (congestive) heart failure: Secondary | ICD-10-CM | POA: Diagnosis not present

## 2019-11-06 DIAGNOSIS — E669 Obesity, unspecified: Secondary | ICD-10-CM | POA: Diagnosis not present

## 2019-11-06 DIAGNOSIS — I471 Supraventricular tachycardia: Secondary | ICD-10-CM | POA: Diagnosis not present

## 2019-11-06 DIAGNOSIS — I428 Other cardiomyopathies: Secondary | ICD-10-CM

## 2019-11-06 LAB — CUP PACEART INCLINIC DEVICE CHECK
Battery Remaining Longevity: 7 mo
Brady Statistic RA Percent Paced: 0.64 %
Brady Statistic RV Percent Paced: 0.61 %
Date Time Interrogation Session: 20210629120357
HighPow Impedance: 47.2455
Implantable Lead Implant Date: 20111010
Implantable Lead Implant Date: 20111010
Implantable Lead Location: 753859
Implantable Lead Location: 753860
Implantable Pulse Generator Implant Date: 20111010
Lead Channel Impedance Value: 437.5 Ohm
Lead Channel Impedance Value: 437.5 Ohm
Lead Channel Pacing Threshold Amplitude: 0.75 V
Lead Channel Pacing Threshold Amplitude: 0.75 V
Lead Channel Pacing Threshold Amplitude: 1.75 V
Lead Channel Pacing Threshold Amplitude: 1.75 V
Lead Channel Pacing Threshold Pulse Width: 0.5 ms
Lead Channel Pacing Threshold Pulse Width: 0.5 ms
Lead Channel Pacing Threshold Pulse Width: 0.7 ms
Lead Channel Pacing Threshold Pulse Width: 0.7 ms
Lead Channel Sensing Intrinsic Amplitude: 12 mV
Lead Channel Sensing Intrinsic Amplitude: 2.9 mV
Lead Channel Setting Pacing Amplitude: 2.5 V
Lead Channel Setting Pacing Amplitude: 3.5 V
Lead Channel Setting Pacing Pulse Width: 0.5 ms
Lead Channel Setting Sensing Sensitivity: 0.5 mV
Pulse Gen Serial Number: 621444

## 2019-11-06 LAB — COMPREHENSIVE METABOLIC PANEL
ALT: 30 IU/L (ref 0–44)
AST: 11 IU/L (ref 0–40)
Albumin/Globulin Ratio: 1.4 (ref 1.2–2.2)
Albumin: 4.3 g/dL (ref 4.0–5.0)
Alkaline Phosphatase: 73 IU/L (ref 48–121)
BUN/Creatinine Ratio: 12 (ref 9–20)
BUN: 12 mg/dL (ref 6–20)
Bilirubin Total: 0.8 mg/dL (ref 0.0–1.2)
CO2: 24 mmol/L (ref 20–29)
Calcium: 9.4 mg/dL (ref 8.7–10.2)
Chloride: 103 mmol/L (ref 96–106)
Creatinine, Ser: 0.97 mg/dL (ref 0.76–1.27)
GFR calc Af Amer: 114 mL/min/{1.73_m2} (ref >59)
GFR calc non Af Amer: 99 mL/min/{1.73_m2} (ref >59)
Globulin, Total: 3.1 g/dL (ref 1.5–4.5)
Glucose: 102 mg/dL — ABNORMAL HIGH (ref 65–99)
Potassium: 4.2 mmol/L (ref 3.5–5.2)
Sodium: 139 mmol/L (ref 134–144)
Total Protein: 7.4 g/dL (ref 6.0–8.5)

## 2019-11-06 LAB — MAGNESIUM: Magnesium: 2 mg/dL (ref 1.6–2.3)

## 2019-11-06 NOTE — Progress Notes (Addendum)
Electrophysiology Office Note Date: 11/06/2019  ID:  Danny E Ebner, DOB [birth year], MRN [id number]  PCP: Dettinger, Elige Radon, MD Primary Cardiologist: Thurmon Fair, MD Electrophysiologist: Dr. Royann Shivers (Device) Will Jorja Loa, MD (Arrhythmias)  CC: Routine ICD follow-up  Danny Hinton is a 39 y.o. male seen today for Will Jorja Loa, MD for acute visit due to SVT.  Pt has been having increased SVT over the past week, in the setting of restarting Victoza -> which led to frequent loose stools. This has happened to him previously. He has started taking immodium and his coreg was also increased. Last episode by device interrogation occurred yesterday am just prior to 0400. Pt was feeling palpitations but otherwise tolerated relatively well. Today he is feeling OK. He feels like he had mild palpitations yesterday afternoon as well, but otherwise is doing well. Denies chest pain, syncope, ICD shock, nausea, or vomiting.   Device History: St. Jude Dual Chamber ICD implanted 02/2010 for CHF History of appropriate therapy: Yes  Past Medical History:  Diagnosis Date  . Anxiety   . Atypical atrial flutter (HCC) 08/19/2014   Pace terminated in device clinic.  AFL cycle length was 390 msec.  Marland Kitchen Automatic implantable cardioverter-defibrillator in situ    a. St Jude in 2011.  . B12 nutritional deficiency, B12 = 214 (03/18/19) 04/29/2019  . Chronic anticoagulation with Coumadin   . Chronic systolic congestive heart failure (HCC)    a. suspected NICM - dx 2010. EF 15% by TEE, 10-20% by echo at that time. b. s/p St. Jude AICD 2011. c. Echo 01/2010: mod dilated LV, EF 30%, mod aortic root dilitation, no significant valvular disease.  . Depression   . Diabetes mellitus (HCC), Rx Victoza 04/29/2019  . Dyslipidemia associated with type 2 diabetes mellitus (HCC), Rx Lipitor 04/29/2019  . Eczema   . Enlarged aorta (HCC)   . Hypertension associated with diabetes (HCC)   . Mobitz type 2 second degree  atrioventricular block    a. During sleeping hours in 2010 suspected due to OSA.  . Morbid obesity (HCC)   . Nonischemic cardiomyopathy (HCC)   . OSA treated with BiPAP 09/04/2018   Severe obstructive sleep apnea with an AHI of 67.2/h and nocturnal hypoxemia with oxygen saturations as low as 82%. Now on BIPAP at 18/14 cm H2O.  Marland Kitchen PAF (paroxysmal atrial fibrillation) (HCC)   . Palpitations   . Paroxysmal atrial flutter (HCC), s/p ablation   . Seasonal allergies   . Sleep apnea   . Thoracic aortic aneurysm (HCC), monitored annually 04/29/2019  . Vitamin D deficiency, vitamin D = 6.9 (03/28/19) 04/29/2019   Past Surgical History:  Procedure Laterality Date  . ATRIAL FLUTTER ABLATION N/A 01/18/2014   Procedure: ATRIAL FLUTTER ABLATION;  Surgeon: Duke Salvia, MD;  Location: [facility] CATH LAB;  Service: Cardiovascular;  Laterality: N/A;  . CARDIAC DEFIBRILLATOR PLACEMENT  02/17/10   St. Jude Medical 45DR, model number [id number], serial number [id number]  . CARDIOVERSION N/A 12/29/2013   Procedure: CARDIOVERSION;  Surgeon: Duke Salvia, MD;  Location: [facility] OR;  Service: Cardiovascular;  Laterality: N/A;  . TOOTH EXTRACTION N/A 10/23/2012   Procedure: EXTRACTION TEETH 1, 16, 17, 30, 31;  Surgeon: [name], DDS;  Location: [facility] OR;  Service: Oral Surgery;  Laterality: N/A;    Current Outpatient Medications  Medication Sig Dispense Refill  . ALPRAZolam (XANAX) 1 MG tablet Take 1 tablet (1 mg total) by mouth 3 (three) times daily as needed for  anxiety. 30 tablet 0  . atorvastatin (LIPITOR) 40 MG tablet Take 1 tablet (40 mg total) by mouth daily. 90 tablet 0  . buPROPion (WELLBUTRIN XL) 150 MG 24 hr tablet Take 1 tablet (150 mg total) by mouth daily. 90 tablet 3  . carvedilol (COREG) 25 MG tablet Take 2 tablets (50 mg total) by mouth 2 (two) times daily with a meal. 270 tablet 0  . digoxin (LANOXIN) 0.125 MG tablet Take 1 tablet (125 mcg total) by mouth every other day. 45 tablet 3  . FLUoxetine  (PROZAC) 20 MG tablet Take 1 tablet (20 mg total) by mouth daily. 30 tablet 3  . fluticasone (FLONASE) 50 MCG/ACT nasal spray Place 1 spray into both nostrils as needed. 16 g 11  . furosemide (LASIX) 20 MG tablet Take 1 tablet (20 mg total) by mouth every other day. 45 tablet 0  . Insulin Pen Needle (BD PEN NEEDLE NANO 2ND GEN) 32G X 4 MM MISC 1 Package by Does not apply route 2 (two) times daily. 100 each 0  . liraglutide (VICTOZA) 18 MG/3ML SOPN Inject 0.3 mLs (1.8 mg total) into the skin daily. 9 mL 1  . loperamide (IMODIUM A-D) 2 MG tablet Take as needed 30 tablet 0  . loratadine (EQ LORATADINE) 10 MG tablet Take 1 tablet (10 mg total) by mouth daily. 90 tablet 0  . metoprolol tartrate (LOPRESSOR) 25 MG tablet Take 1 tablet as needed for palpitations 90 tablet 3  . ondansetron (ZOFRAN) 4 MG tablet Take 1 tablet (4 mg total) by mouth every 8 (eight) hours as needed for nausea or vomiting. 20 tablet 0  . Probiotic Product (VSL#3) CAPS Take twice a day    . sacubitril-valsartan (ENTRESTO) 49-51 MG Take 1 tablet by mouth 2 (two) times daily. 180 tablet 1  . Vitamin D, Ergocalciferol, (DRISDOL) 1.25 MG (50000 UNIT) CAPS capsule Take 1 capsule (50,000 Units total) by mouth every 7 (seven) days. 12 capsule 0  . warfarin (COUMADIN) 5 MG tablet TAKE 1 TABLET BY MOUTH ONCE DAILY EXCEPT  TAKE  1  &  1/2  TABLET  EACH  FRIDAY  OR  AS  DIRECTED  BY  ANTICOAGULATION  CLINIC 100 tablet 3   No current facility-administered medications for this visit.    Allergies:   Patient has no known allergies.   Social History: Social History   Socioeconomic History  . Marital status: Married    Spouse name: Not on file  . Number of children: 1  . Years of education: Not on file  . Highest education level: Not on file  Occupational History  . Occupation: disabled  Tobacco Use  . Smoking status: Never Smoker  . Smokeless tobacco: Never Used  Vaping Use  . Vaping Use: Never used  Substance and Sexual  Activity  . Alcohol use: No  . Drug use: No  . Sexual activity: Yes  Other Topics Concern  . Not on file  Social History Narrative  . Not on file   Social Determinants of Health   Financial Resource Strain:   . Difficulty of Paying Living Expenses:   Food Insecurity:   . Worried About Programme researcher, broadcasting/film/video in the Last Year:   . Barista in the Last Year:   Transportation Needs:   . Freight forwarder (Medical):   Marland Kitchen Lack of Transportation (Non-Medical):   Physical Activity:   . Days of Exercise per Week:   . Minutes of Exercise  per Session:   Stress:   . Feeling of Stress :   Social Connections:   . Frequency of Communication with Friends and Family:   . Frequency of Social Gatherings with Friends and Family:   . Attends Religious Services:   . Active Member of Clubs or Organizations:   . Attends Banker Meetings:   Marland Kitchen Marital Status:   Intimate Partner Violence:   . Fear of Current or Ex-Partner:   . Emotionally Abused:   Marland Kitchen Physically Abused:   . Sexually Abused:     Family History: Family History  Problem Relation Age of Onset  . Hodgkin's lymphoma Brother 25  . Heart disease Mother   . Depression Mother   . Anxiety disorder Mother   . Arrhythmia Mother   . Colon polyps Father   . Heart disease Maternal Grandfather   . Diabetes Paternal Grandmother   . Irritable bowel syndrome Maternal Aunt        2 or 3 mat aunts have IBS    Review of Systems: All other systems reviewed and are otherwise negative except as noted above.   Physical Exam: Vitals:   11/06/19 1057  BP: 98/60  Pulse: 81  SpO2: 97%  Weight: (!) 458 lb (207.7 kg)  Height: 6\' 3"  (1.905 m)     GEN- The patient is well appearing, alert and oriented x 3 today.   HEENT: normocephalic, atraumatic; sclera clear, conjunctiva pink; hearing intact; oropharynx clear; neck supple, no JVP Lymph- no cervical lymphadenopathy Lungs- Clear to ausculation bilaterally, normal work of  breathing.  No wheezes, rales, rhonchi Heart- Regular rate and rhythm, no murmurs, rubs or gallops, PMI not laterally displaced GI- soft, non-tender, non-distended, bowel sounds present, no hepatosplenomegaly Extremities- no clubbing, cyanosis, or edema; DP/PT/radial pulses 2+ bilaterally MS- no significant deformity or atrophy Skin- warm and dry, no rash or lesion; ICD pocket well healed Psych- euthymic mood, full affect Neuro- strength and sensation are intact  ICD interrogation- reviewed in detail today,  See PACEART report  EKG:  EKG is not ordered today.  Recent Labs: 03/28/2019: TSH 2.530 07/30/2019: Hemoglobin 11.8; Platelets 261 11/05/2019: ALT 30; BUN 12; Creatinine, Ser 0.97; Magnesium 2.0; Potassium 4.2; Sodium 139   Wt Readings from Last 3 Encounters:  11/06/19 (!) 458 lb (207.7 kg)  10/23/19 (!) 460 lb (208.7 kg)  09/26/19 (!) 460 lb (208.7 kg)     Other studies Reviewed: Additional studies/ records that were reviewed today include: Recent labwork, previous remotes, previous EP office notes.   Assessment and Plan:  1.  Chronic systolic dysfunction s/p St. Jude dual chamber ICD, NICM euvolemic today Stable on an appropriate/max tolerated medical regimen Normal ICD function See Pace Art report No changes today He has been holding lasix with diarrhea. Instructed to resume Thursday as diarrhea continues to improve (only 2 episodes yesterday, on immodium, and he has been rehydrating with gatorade)  2. SVT/AT Likely exacerbated in the setting of diarrheal illness.  He has NOT been shocked due to speeds in 150s. His first therapy zone is for HRs > 211 bpm. He has not had any further since yesterday am ~0400. Diarrhea is decreasing and electrolytes WNL. Coreg increased 11/05/2019.  3. Paroxsymal atrial fibrillation Remains on coumadin for CHA2DS2VASC of at least 3  (CHF, HTN, Diabetes) Recent arrhythmias appear SVT/AT by device interrogation  4. Obesity Body mass  index is 57.25 kg/m.  Would not likely be candidate for ablation.   Current medicines are reviewed  at length with the patient today.   The patient does not have concerns regarding his medicines.  The following changes were made today:  none  Labs/ tests ordered today include:  Orders Placed This Encounter  Procedures  . Digoxin level    Disposition:   Follow up with Dr. Royann Shivers as scheduled.   Dustin Flock, PA-C  11/06/2019 12:07 PM  [facility] [address] [place] [address] [phone] (office) [phone] (fax)

## 2019-11-06 NOTE — Telephone Encounter (Signed)
Great! Thanks again.

## 2019-11-06 NOTE — Patient Instructions (Addendum)
Medication Instructions:  *If you need a refill on your cardiac medications before your next appointment, please call your pharmacy*  Lab Work: Your physician has recommended that you have lab work today: Digoxin Level  If you have labs (blood work) drawn today and your tests are completely normal, you will receive your results only by: Marland Kitchen MyChart Message (if you have MyChart) OR . A paper copy in the mail If you have any lab test that is abnormal or we need to change your treatment, we will call you to review the results.  Testing/Procedures: None Ordered  Follow-Up: At [facility], you and your health needs are our priority.  As part of our continuing mission to provide you with exceptional heart care, we have created designated Provider Care Teams.  These Care Teams include your primary Cardiologist (physician) and Advanced Practice Providers (APPs -  Physician Assistants and Nurse Practitioners) who all work together to provide you with the care you need, when you need it.  We recommend signing up for the patient portal called "MyChart".  Sign up information is provided on this After Visit Summary.  MyChart is used to connect with patients for Virtual Visits (Telemedicine).  Patients are able to view lab/test results, encounter notes, upcoming appointments, etc.  Non-urgent messages can be sent to your provider as well.   To learn more about what you can do with MyChart, go to [email or web address].    Your next appointment:   Your physician recommends that you keep your tentative scheduled follow-up appointment with Dr. Thurmon Fair, MD in November 2021.  Remote monitoring is used to monitor your ICD from home. This monitoring reduces the number of office visits required to check your device to one time per year. It allows [place] to keep an eye on the functioning of your device to ensure it is working properly. You are scheduled for a device check from home on 02/01/20. You may send  your transmission at any time that day. If you have a wireless device, the transmission will be sent automatically. After your physician reviews your transmission, you will receive a postcard with your next transmission date.  The format for your next appointment:   In person with Dr. Thurmon Fair

## 2019-11-06 NOTE — Telephone Encounter (Signed)
Thanks, Mardelle Matte. I looked at all of them and I believe they are all SVT. Glad he is better. No dizziness with the high dose carvedilol, I guess. Did you give him instructions for the diuretic (I had told him to stop it while having diarrhea).

## 2019-11-06 NOTE — Telephone Encounter (Signed)
I did! He had not restarted it yet, and is having about 2 episodes of diarrhea a day still, which is gradually decreasing. I told him to plan on restarting it Thursday, as long as his diarrhea didn't flare back up. (He's eating and drinking normally and using gatorade to rehydrate)

## 2019-11-06 NOTE — Telephone Encounter (Signed)
  Pt feeling well at office visit today. No further SVT since yesterday am just before 0400.  Coreg increased, diarrhea improving.    Episodes available labeled as SVT, several episodes labeled as "VT" but only 1 EGM available. Appears similar to his other episodes, but duration > 1 m.   End of episode pictured below.   Digoxin level drawn today.

## 2019-11-07 ENCOUNTER — Ambulatory Visit (INDEPENDENT_AMBULATORY_CARE_PROVIDER_SITE_OTHER): Payer: Medicaid Other | Admitting: Family Medicine

## 2019-11-07 ENCOUNTER — Telehealth: Payer: Self-pay | Admitting: *Deleted

## 2019-11-07 ENCOUNTER — Telehealth: Payer: Self-pay

## 2019-11-07 DIAGNOSIS — I480 Paroxysmal atrial fibrillation: Secondary | ICD-10-CM

## 2019-11-07 DIAGNOSIS — I10 Essential (primary) hypertension: Secondary | ICD-10-CM

## 2019-11-07 LAB — DIGOXIN LEVEL: Digoxin, Serum: <0.4 ng/mL — ABNORMAL LOW (ref 0.5–0.9)

## 2019-11-07 MED ORDER — DIGOXIN 125 MCG PO TABS: 125.0000 ug | ORAL_TABLET | Freq: Every day | ORAL | 3 refills | Status: DC

## 2019-11-07 NOTE — Telephone Encounter (Signed)
Follow up   Pt is returning call    

## 2019-11-07 NOTE — Telephone Encounter (Signed)
-----   Message from Graciella Freer, PA-C sent at 11/07/2019  8:15 AM EDT ----- Please have patient continue digoxin every OTHER day like he has been doing.   Thank you!  Casimiro Needle [address]" [place], PA-C  11/07/2019 8:15 AM

## 2019-11-07 NOTE — Telephone Encounter (Signed)
Patient has been called and verbalized his understanding. A new prescription has been sent in for him.  Per Dr. Royann Shivers according to the digoxin lab results:  Croitoru, Rachelle Hora, MD  Sandi Mariscal, RN Please increase the digoxin to daily

## 2019-11-07 NOTE — Telephone Encounter (Signed)
LMTCB for lab results and recommendations 

## 2019-11-08 NOTE — Telephone Encounter (Signed)
Called pateint to go over lab results and recommendations and patient informed me that per Dr. Royann Shivers he is to INCREASE his digoxin to every day. I advised the pateint to follow the instructions given to him by Dr. Royann Shivers and that I would update Mardelle Matte and we would follow up accordingly. Pt is agreeable.

## 2019-12-06 DIAGNOSIS — I4892 Unspecified atrial flutter: Secondary | ICD-10-CM | POA: Diagnosis not present

## 2019-12-06 DIAGNOSIS — I4891 Unspecified atrial fibrillation: Secondary | ICD-10-CM | POA: Diagnosis not present

## 2019-12-06 DIAGNOSIS — E119 Type 2 diabetes mellitus without complications: Secondary | ICD-10-CM | POA: Diagnosis not present

## 2019-12-06 DIAGNOSIS — Z95810 Presence of automatic (implantable) cardiac defibrillator: Secondary | ICD-10-CM | POA: Diagnosis not present

## 2019-12-06 DIAGNOSIS — G4733 Obstructive sleep apnea (adult) (pediatric): Secondary | ICD-10-CM | POA: Diagnosis not present

## 2019-12-06 DIAGNOSIS — I10 Essential (primary) hypertension: Secondary | ICD-10-CM | POA: Diagnosis not present

## 2019-12-06 DIAGNOSIS — I509 Heart failure, unspecified: Secondary | ICD-10-CM | POA: Diagnosis not present

## 2019-12-06 DIAGNOSIS — E782 Mixed hyperlipidemia: Secondary | ICD-10-CM | POA: Diagnosis not present

## 2019-12-10 ENCOUNTER — Telehealth: Payer: Self-pay | Admitting: *Deleted

## 2019-12-10 NOTE — Telephone Encounter (Signed)
Pre op clearance has been faxed to [facility] [phone]   Low to moderate increased risk of CV Complications due to systolic CHF, DM and Atrial flutter  Ok to hold warfarin for 5-7 days days before surgery. No need for bridging.   Clearance will be scanned as well.

## 2019-12-11 ENCOUNTER — Encounter: Payer: Self-pay | Admitting: Family Medicine

## 2019-12-13 DIAGNOSIS — Z713 Dietary counseling and surveillance: Secondary | ICD-10-CM | POA: Diagnosis not present

## 2019-12-13 DIAGNOSIS — E119 Type 2 diabetes mellitus without complications: Secondary | ICD-10-CM | POA: Diagnosis not present

## 2019-12-13 DIAGNOSIS — I509 Heart failure, unspecified: Secondary | ICD-10-CM | POA: Diagnosis not present

## 2019-12-13 DIAGNOSIS — G4733 Obstructive sleep apnea (adult) (pediatric): Secondary | ICD-10-CM | POA: Diagnosis not present

## 2019-12-17 DIAGNOSIS — I471 Supraventricular tachycardia: Secondary | ICD-10-CM

## 2019-12-17 MED ORDER — CARVEDILOL 25 MG PO TABS: 50.0000 mg | ORAL_TABLET | Freq: Two times a day (BID) | ORAL | 3 refills | Status: DC

## 2019-12-25 DIAGNOSIS — G4733 Obstructive sleep apnea (adult) (pediatric): Secondary | ICD-10-CM | POA: Diagnosis not present

## 2019-12-29 ENCOUNTER — Other Ambulatory Visit: Payer: Self-pay | Admitting: Cardiovascular Disease

## 2020-01-02 DIAGNOSIS — F54 Psychological and behavioral factors associated with disorders or diseases classified elsewhere: Secondary | ICD-10-CM | POA: Diagnosis not present

## 2020-01-02 DIAGNOSIS — Z6841 Body Mass Index (BMI) 40.0 and over, adult: Secondary | ICD-10-CM | POA: Diagnosis not present

## 2020-01-02 DIAGNOSIS — Z7189 Other specified counseling: Secondary | ICD-10-CM | POA: Diagnosis not present

## 2020-01-03 ENCOUNTER — Telehealth: Payer: Self-pay | Admitting: Cardiovascular Disease

## 2020-01-03 NOTE — Telephone Encounter (Signed)
Thanks. Not a surprise that he has some breakthrough arrhythmia and this was brief. No change in meds and continue to monitor.

## 2020-01-03 NOTE — Telephone Encounter (Signed)
Patient called and discussed Dr. Aldona Bar recommendations. Patient verbalizes understanding. Encouraged patient to still follow-up with PCP. Patient agrees to plan.

## 2020-01-03 NOTE — Telephone Encounter (Signed)
Patient sent in a transmission today because he "felt kinda off" today. He wants to know if the transmission showed anything.

## 2020-01-03 NOTE — Telephone Encounter (Signed)
Transmission reviewed. 4 Episodes of AT with a max Atrial of 158 bpm, longest episode 1 minute 4 seconds. 1 episode of SVT, 157 bpm. Patient has hx of same. States he was taking a shower and felt fatigued. Patient reports of palpations and unable to swallow. Patient reports his heart rate went to 155-160 bpm.  Per med list coreg 50 mg BID, Entresto 49-51 mg BID, Lopressor 25mg  prn, coumadin 5 mg daily. Patient reports compliant with medications except entresto. Reports he has been out of entresto for the past 4 days. States he got it refilled and took it today.   ED precautions reviewed.  Advised I would forward this information to Dr. for review.

## 2020-01-04 ENCOUNTER — Encounter: Payer: Self-pay | Admitting: Family Medicine

## 2020-01-04 DIAGNOSIS — J0100 Acute maxillary sinusitis, unspecified: Secondary | ICD-10-CM

## 2020-01-04 MED ORDER — FLUTICASONE PROPIONATE 50 MCG/ACT NA SUSP: 1.0000 | NASAL | 11 refills | Status: DC | PRN

## 2020-01-22 ENCOUNTER — Telehealth: Payer: Self-pay

## 2020-01-22 NOTE — Telephone Encounter (Signed)
The pt sent a transmission and wants to know if he had any episodes because a little off. Had some SOB like he might of had A-fib. He had the SOB without doing anything. I told him the nurse will give him a call back at [phone].

## 2020-01-22 NOTE — Telephone Encounter (Signed)
Transmission received and reviewed. Presenting rhythm- SVT rates in 150s.Episode has been on and off since yesterday morning at time in VT-1 monitor zones with rates in the 160s.  Patient has history of the same.  Current meds include Digoxin 0.125mg  daily, Carvedilol 50mg  BID, Entresto 49-51mg  BID.  Patient has PRN metoprolol 25mg  which he took 1 dose yesterday and 1 dose today around 8:30am.  He reports as long as he stays sitting he is fine/ asymptmatic, he does begin to feel his heart race and chest pressure anytime he gets up to move.    I included one of the strips of the VT-1 monitor zone episode as well.

## 2020-01-22 NOTE — Telephone Encounter (Signed)
I think it's time to get [name] back in with EP. Amy, could you please help with an appointment. My nurse is out for the rest of this week. Thank you!

## 2020-01-22 NOTE — Telephone Encounter (Signed)
Spoke with pt, advised of MD recommendation.  Reviewed Ed precautions.  Scheduling to contact pt to arrange visit.

## 2020-01-23 ENCOUNTER — Encounter: Payer: Self-pay | Admitting: Family Medicine

## 2020-01-25 ENCOUNTER — Other Ambulatory Visit: Payer: Self-pay | Admitting: Family Medicine

## 2020-01-25 NOTE — Telephone Encounter (Signed)
Dettinger. NTBS Last OV & Rx was 08/03/18

## 2020-01-28 DIAGNOSIS — F329 Major depressive disorder, single episode, unspecified: Secondary | ICD-10-CM | POA: Diagnosis not present

## 2020-01-28 DIAGNOSIS — I4891 Unspecified atrial fibrillation: Secondary | ICD-10-CM | POA: Diagnosis not present

## 2020-01-28 DIAGNOSIS — R419 Unspecified symptoms and signs involving cognitive functions and awareness: Secondary | ICD-10-CM | POA: Diagnosis not present

## 2020-01-28 DIAGNOSIS — K219 Gastro-esophageal reflux disease without esophagitis: Secondary | ICD-10-CM | POA: Diagnosis not present

## 2020-01-28 DIAGNOSIS — Z9989 Dependence on other enabling machines and devices: Secondary | ICD-10-CM | POA: Diagnosis not present

## 2020-01-28 DIAGNOSIS — I509 Heart failure, unspecified: Secondary | ICD-10-CM | POA: Diagnosis not present

## 2020-01-28 DIAGNOSIS — I110 Hypertensive heart disease with heart failure: Secondary | ICD-10-CM | POA: Diagnosis not present

## 2020-01-28 DIAGNOSIS — G4733 Obstructive sleep apnea (adult) (pediatric): Secondary | ICD-10-CM | POA: Diagnosis not present

## 2020-01-28 DIAGNOSIS — I4892 Unspecified atrial flutter: Secondary | ICD-10-CM | POA: Diagnosis not present

## 2020-01-28 DIAGNOSIS — Z6841 Body Mass Index (BMI) 40.0 and over, adult: Secondary | ICD-10-CM | POA: Diagnosis not present

## 2020-01-28 DIAGNOSIS — E782 Mixed hyperlipidemia: Secondary | ICD-10-CM | POA: Diagnosis not present

## 2020-01-28 DIAGNOSIS — Z95810 Presence of automatic (implantable) cardiac defibrillator: Secondary | ICD-10-CM | POA: Diagnosis not present

## 2020-01-28 NOTE — Telephone Encounter (Signed)
Attempted to contact patient - NA °

## 2020-01-29 DIAGNOSIS — Z95810 Presence of automatic (implantable) cardiac defibrillator: Secondary | ICD-10-CM | POA: Diagnosis not present

## 2020-01-29 DIAGNOSIS — I10 Essential (primary) hypertension: Secondary | ICD-10-CM | POA: Diagnosis not present

## 2020-01-29 DIAGNOSIS — I4891 Unspecified atrial fibrillation: Secondary | ICD-10-CM | POA: Diagnosis not present

## 2020-01-29 DIAGNOSIS — I4892 Unspecified atrial flutter: Secondary | ICD-10-CM | POA: Diagnosis not present

## 2020-01-29 DIAGNOSIS — K449 Diaphragmatic hernia without obstruction or gangrene: Secondary | ICD-10-CM | POA: Diagnosis not present

## 2020-01-29 DIAGNOSIS — G4733 Obstructive sleep apnea (adult) (pediatric): Secondary | ICD-10-CM | POA: Diagnosis not present

## 2020-01-29 DIAGNOSIS — E782 Mixed hyperlipidemia: Secondary | ICD-10-CM | POA: Diagnosis not present

## 2020-01-29 DIAGNOSIS — E119 Type 2 diabetes mellitus without complications: Secondary | ICD-10-CM | POA: Diagnosis not present

## 2020-01-29 DIAGNOSIS — I509 Heart failure, unspecified: Secondary | ICD-10-CM | POA: Diagnosis not present

## 2020-01-31 DIAGNOSIS — Z029 Encounter for administrative examinations, unspecified: Secondary | ICD-10-CM

## 2020-02-04 ENCOUNTER — Telehealth: Payer: Self-pay | Admitting: *Deleted

## 2020-02-04 NOTE — Telephone Encounter (Signed)
Entresto PA sent through CoverMyMeds. Key Code: [id number]

## 2020-02-05 ENCOUNTER — Other Ambulatory Visit: Payer: Self-pay

## 2020-02-05 ENCOUNTER — Encounter: Payer: Self-pay | Admitting: Cardiovascular Disease

## 2020-02-05 ENCOUNTER — Encounter: Payer: Self-pay | Admitting: Cardiology

## 2020-02-05 ENCOUNTER — Ambulatory Visit (INDEPENDENT_AMBULATORY_CARE_PROVIDER_SITE_OTHER): Payer: Medicaid Other | Admitting: Cardiology

## 2020-02-05 VITALS — BP 126/62 | HR 85 | Ht 75.0 in | Wt >= 6400 oz

## 2020-02-05 DIAGNOSIS — I471 Supraventricular tachycardia: Secondary | ICD-10-CM | POA: Diagnosis not present

## 2020-02-05 NOTE — Telephone Encounter (Signed)
The prior auth had originally been declined. Call placed to the insurance company. Danny Hinton has been approved through 01/29/2021.

## 2020-02-05 NOTE — Progress Notes (Signed)
Electrophysiology Office Note   Date:  02/05/2020   ID:  Danny Hinton, DOB 02/25/1981, MRN [id number]  PCP:  Dettinger, Elige Radon, MD  Cardiologist:  Croitrou Primary Electrophysiologist:  Zaeda Mcferran Jorja Loa, MD    No chief complaint on file.    History of Present Illness: Danny Hinton is a 39 y.o. male who is being seen today for the evaluation of atrial tachycardia at the request of Micah Flesher. Presenting today for electrophysiology evaluation.  He has a history significant for paroxysmal atrial fibrillation, atypical atrial flutter status post ablation in 2015, chronic Stockard failure due to nonischemic cardiomyopathy, status post [facility] Jude ICD, hypertension, hyperlipidemia, and obesity.  He is anticoagulated with Coumadin.  He also has morbid obesity.  Today, denies symptoms of palpitations, chest pain, shortness of breath, orthopnea, PND, lower extremity edema, claudication, dizziness, presyncope, syncope, bleeding, or neurologic sequela. The patient is tolerating medications without difficulties.  He continues to do well.  He had no chest pain or shortness of breath.  Last week, he developed palpitations on and off for 3 days.  He says that his heart rates at baseline were in the 80s, but up into the 140s when he exerted himself.  He has had an device interrogations that show a likely atrial tachycardia.  Device interrogation today shows also continued atrial tachycardia on September 13-15.  Since then he has had no further episodes.  Past Medical History:  Diagnosis Date  . Anxiety   . Atypical atrial flutter (HCC) 08/19/2014   Pace terminated in device clinic.  AFL cycle length was 390 msec.  Marland Kitchen Automatic implantable cardioverter-defibrillator in situ    a. St Jude in 2011.  . B12 nutritional deficiency, B12 = 214 (03/18/19) 04/29/2019  . Chronic anticoagulation with Coumadin   . Chronic systolic congestive heart failure (HCC)    a. suspected NICM - dx 2010. EF 15% by TEE,  10-20% by echo at that time. b. s/p St. Jude AICD 2011. c. Echo 01/2010: mod dilated LV, EF 30%, mod aortic root dilitation, no significant valvular disease.  . Depression   . Diabetes mellitus (HCC), Rx Victoza 04/29/2019  . Dyslipidemia associated with type 2 diabetes mellitus (HCC), Rx Lipitor 04/29/2019  . Eczema   . Enlarged aorta (HCC)   . Hypertension associated with diabetes (HCC)   . Mobitz type 2 second degree atrioventricular block    a. During sleeping hours in 2010 suspected due to OSA.  . Morbid obesity (HCC)   . Nonischemic cardiomyopathy (HCC)   . OSA treated with BiPAP 09/04/2018   Severe obstructive sleep apnea with an AHI of 67.2/h and nocturnal hypoxemia with oxygen saturations as low as 82%. Now on BIPAP at 18/14 cm H2O.  Marland Kitchen PAF (paroxysmal atrial fibrillation) (HCC)   . Palpitations   . Paroxysmal atrial flutter (HCC), s/p ablation   . Seasonal allergies   . Sleep apnea   . Thoracic aortic aneurysm (HCC), monitored annually 04/29/2019  . Vitamin D deficiency, vitamin D = 6.9 (03/28/19) 04/29/2019   Past Surgical History:  Procedure Laterality Date  . ATRIAL FLUTTER ABLATION N/A 01/18/2014   Procedure: ATRIAL FLUTTER ABLATION;  Surgeon: Duke Salvia, MD;  Location: [facility] CATH LAB;  Service: Cardiovascular;  Laterality: N/A;  . CARDIAC DEFIBRILLATOR PLACEMENT  02/17/10   St. Jude Medical 45DR, model number [id number], serial number [id number]  . CARDIOVERSION N/A 12/29/2013   Procedure: CARDIOVERSION;  Surgeon: Duke Salvia, MD;  Location: [facility] OR;  Service: Cardiovascular;  Laterality: N/A;  . TOOTH EXTRACTION N/A 10/23/2012   Procedure: EXTRACTION TEETH 1, 16, 17, 30, 31;  Surgeon: [name], DDS;  Location: [facility] OR;  Service: Oral Surgery;  Laterality: N/A;     Current Outpatient Medications  Medication Sig Dispense Refill  . ALPRAZolam (XANAX) 1 MG tablet Take 1 tablet (1 mg total) by mouth 3 (three) times daily as needed for anxiety. 30 tablet 0  . atorvastatin  (LIPITOR) 40 MG tablet Take 1 tablet (40 mg total) by mouth daily. 90 tablet 0  . buPROPion (WELLBUTRIN XL) 150 MG 24 hr tablet Take 1 tablet (150 mg total) by mouth daily. 90 tablet 3  . carvedilol (COREG) 25 MG tablet Take 2 tablets (50 mg total) by mouth 2 (two) times daily with a meal. 120 tablet 3  . digoxin (LANOXIN) 0.125 MG tablet Take 1 tablet (125 mcg total) by mouth daily. 30 tablet 3  . ENTRESTO 49-51 MG Take 1 tablet by mouth twice daily 180 tablet 2  . FLUoxetine (PROZAC) 20 MG tablet Take 1 tablet (20 mg total) by mouth daily. 30 tablet 3  . fluticasone (FLONASE) 50 MCG/ACT nasal spray Place 1 spray into both nostrils as needed. 16 g 11  . furosemide (LASIX) 20 MG tablet Take 1 tablet (20 mg total) by mouth every other day. 45 tablet 0  . Insulin Pen Needle (BD PEN NEEDLE NANO 2ND GEN) 32G X 4 MM MISC 1 Package by Does not apply route 2 (two) times daily. 100 each 0  . liraglutide (VICTOZA) 18 MG/3ML SOPN Inject 0.3 mLs (1.8 mg total) into the skin daily. 9 mL 1  . loperamide (IMODIUM A-D) 2 MG tablet Take as needed 30 tablet 0  . loratadine (EQ LORATADINE) 10 MG tablet Take 1 tablet (10 mg total) by mouth daily. 90 tablet 0  . metoprolol tartrate (LOPRESSOR) 25 MG tablet Take 1 tablet as needed for palpitations 90 tablet 3  . ondansetron (ZOFRAN) 4 MG tablet Take 1 tablet (4 mg total) by mouth every 8 (eight) hours as needed for nausea or vomiting. 20 tablet 0  . Probiotic Product (VSL#3) CAPS Take twice a day    . Vitamin D, Ergocalciferol, (DRISDOL) 1.25 MG (50000 UNIT) CAPS capsule Take 1 capsule (50,000 Units total) by mouth every 7 (seven) days. 12 capsule 0  . warfarin (COUMADIN) 5 MG tablet TAKE 1 TABLET BY MOUTH ONCE DAILY EXCEPT  TAKE  1  &  1/2  TABLET  EACH  FRIDAY  OR  AS  DIRECTED  BY  ANTICOAGULATION  CLINIC 100 tablet 3   No current facility-administered medications for this visit.    Allergies:   Patient has no known allergies.   Social History:  The patient   reports that he has never smoked. He has never used smokeless tobacco. He reports that he does not drink alcohol and does not use drugs.   Family History:  The patient's family history includes Anxiety disorder in his mother; Arrhythmia in his mother; Colon polyps in his father; Depression in his mother; Diabetes in his paternal grandmother; Heart disease in his maternal grandfather and mother; Hodgkin's lymphoma (age of onset: 55) in his brother; Irritable bowel syndrome in his maternal aunt.   ROS:  Please see the history of present illness.   Otherwise, review of systems is positive for none.   All other systems are reviewed and negative.   PHYSICAL EXAM: VS:  BP 126/62  Pulse 85   Ht 6\' 3"  (1.905 m)   Wt (!) 430 lb (195 kg)   SpO2 96%   BMI 53.75 kg/m  , BMI Body mass index is 53.75 kg/m. GEN: Well nourished, well developed, in no acute distress  HEENT: normal  Neck: no JVD, carotid bruits, or masses Cardiac: RRR; no murmurs, rubs, or gallops,no edema  Respiratory:  clear to auscultation bilaterally, normal work of breathing GI: soft, nontender, nondistended, + BS MS: no deformity or atrophy  Skin: warm and dry, device site well healed Neuro:  Strength and sensation are intact Psych: euthymic mood, full affect  EKG:  EKG is ordered today. Personal review of the ekg ordered shows sinus rhythm, rate 85  Personal review of the device interrogation today. Results in [facility]aceart    Recent Labs: 03/28/2019: TSH 2.530 07/30/2019: Hemoglobin 11.8; Platelets 261 11/05/2019: ALT 30; BUN 12; Creatinine, Ser 0.97; Magnesium 2.0; Potassium 4.2; Sodium 139    Lipid Panel     Component Value Date/Time   CHOL 133 07/30/2019 1131   TRIG 86 07/30/2019 1131   HDL 40 07/30/2019 1131   CHOLHDL 3.3 07/30/2019 1131   CHOLHDL 5.3 03/20/2009 0410   VLDL 22 03/20/2009 0410   LDLCALC 76 07/30/2019 1131   LDLDIRECT 52 03/26/2015 0824     Wt Readings from Last 3 Encounters:  02/05/20 (!) 430  lb (195 kg)  11/06/19 (!) 458 lb (207.7 kg)  10/23/19 (!) 460 lb (208.7 kg)      Other studies Reviewed: Additional studies/ records that were reviewed today include: TTE 07/2015  Review of the above records today demonstrates:  - Left ventricle: The cavity size was mildly dilated. Wall   thickness was increased in a pattern of mild LVH. Systolic   function was moderately reduced. The estimated ejection fraction   was in the range of 35% to 40%. Diffuse hypokinesis. The study is   not technically sufficient to allow evaluation of LV diastolic   function. - Aortic root: The aortic root was moderately dilated. - Left atrium: The atrium was mildly dilated. - Right ventricle: The cavity size was mildly dilated. - Pericardium, extracardiac: A trivial pericardial effusion was   identified.   ASSESSMENT AND PLAN:  1.  Atrial tachycardia: Has had multiple episodes.  Would benefit most from rhythm control.  We initially talked about medication management, but he would like to try to avoid antiarrhythmics.  He is planning for gastric bypass surgery I have told him that if he does lose a significant amount of weight then ablation would be a reasonable option.  He has as needed metoprolol to take which he says does improve his symptoms.  I have also told him to try vagal maneuvers.  2.  Paroxysmal atrial fibrillation: Currently on Coumadin.  CHA2DS2-VASc of 2.  No episodes noted on his device.   3.  Nonischemic cardiomyopathy: Status post [facility] Jude ICD.  Currently on carvedilol and Entresto.  Device functioning appropriately.  No changes.    4.  Obstructive sleep apnea: CPAP compliance encouraged  6.  Morbid obesity: Diet and exercise encouraged  Current medicines are reviewed at length with the patient today.   The patient does not have concerns regarding his medicines.  The following changes were made today: None  Labs/ tests ordered today include:  Orders Placed This Encounter    Procedures  . EKG 12-Lead    Disposition:   FU with Sakara Lehtinen 6 months  Signed, Devona Holmes [employer],  MD  02/05/2020 4:16 PM     [facility] [address] [place] [address] [phone] (office) [phone] (fax)

## 2020-02-05 NOTE — Patient Instructions (Signed)
Medication Instructions:  Your physician recommends that you continue on your current medications as directed. Please refer to the Current Medication list given to you today.  *If you need a refill on your cardiac medications before your next appointment, please call your pharmacy*   Lab Work: None ordered  Testing/Procedures: None ordered   Follow-Up: At [facility], you and your health needs are our priority.  As part of our continuing mission to provide you with exceptional heart care, we have created designated Provider Care Teams.  These Care Teams include your primary Cardiologist (physician) and Advanced Practice Providers (APPs -  Physician Assistants and Nurse Practitioners) who all work together to provide you with the care you need, when you need it.  Remote monitoring is used to monitor your Pacemaker or ICD from home. This monitoring reduces the number of office visits required to check your device to one time per year. It allows [place] to keep an eye on the functioning of your device to ensure it is working properly. You are scheduled for a device check from home on 03/03/2020. You may send your transmission at any time that day. If you have a wireless device, the transmission will be sent automatically. After your physician reviews your transmission, you will receive a postcard with your next transmission date.  Your next appointment:   6 month(s)  The format for your next appointment:   In Person  Provider:   Loman Brooklyn, MD   Thank you for choosing [facility]!!   Dory Horn, RN [phone]    Other Instructions

## 2020-02-08 ENCOUNTER — Encounter: Payer: Self-pay | Admitting: Cardiovascular Disease

## 2020-02-08 ENCOUNTER — Other Ambulatory Visit: Payer: Self-pay

## 2020-02-08 ENCOUNTER — Ambulatory Visit (INDEPENDENT_AMBULATORY_CARE_PROVIDER_SITE_OTHER): Payer: Medicaid Other | Admitting: Cardiovascular Disease

## 2020-02-08 ENCOUNTER — Ambulatory Visit (INDEPENDENT_AMBULATORY_CARE_PROVIDER_SITE_OTHER): Payer: Medicaid Other

## 2020-02-08 VITALS — BP 102/56 | HR 64 | Ht 75.0 in | Wt >= 6400 oz

## 2020-02-08 DIAGNOSIS — I480 Paroxysmal atrial fibrillation: Secondary | ICD-10-CM

## 2020-02-08 DIAGNOSIS — I428 Other cardiomyopathies: Secondary | ICD-10-CM | POA: Diagnosis not present

## 2020-02-08 DIAGNOSIS — Z5181 Encounter for therapeutic drug level monitoring: Secondary | ICD-10-CM

## 2020-02-08 DIAGNOSIS — Z7901 Long term (current) use of anticoagulants: Secondary | ICD-10-CM

## 2020-02-08 DIAGNOSIS — E1169 Type 2 diabetes mellitus with other specified complication: Secondary | ICD-10-CM | POA: Diagnosis not present

## 2020-02-08 DIAGNOSIS — I5042 Chronic combined systolic (congestive) and diastolic (congestive) heart failure: Secondary | ICD-10-CM | POA: Diagnosis not present

## 2020-02-08 DIAGNOSIS — E669 Obesity, unspecified: Secondary | ICD-10-CM | POA: Diagnosis not present

## 2020-02-08 DIAGNOSIS — I10 Essential (primary) hypertension: Secondary | ICD-10-CM | POA: Diagnosis not present

## 2020-02-08 DIAGNOSIS — Z95810 Presence of automatic (implantable) cardiac defibrillator: Secondary | ICD-10-CM | POA: Diagnosis not present

## 2020-02-08 DIAGNOSIS — Z01810 Encounter for preprocedural cardiovascular examination: Secondary | ICD-10-CM

## 2020-02-08 DIAGNOSIS — E782 Mixed hyperlipidemia: Secondary | ICD-10-CM

## 2020-02-08 DIAGNOSIS — I471 Supraventricular tachycardia: Secondary | ICD-10-CM

## 2020-02-08 LAB — POCT INR: INR: 2.1 (ref 2.0–3.0)

## 2020-02-08 NOTE — Progress Notes (Signed)
Patient ID: Danny Hinton, male   DOB: 05/21/1980, 39 y.o.   MRN: [id number]    Cardiology Office Note    Date:  02/08/2020   ID:  Danny Hinton, DOB 23-Nov-1980, MRN [id number]  PCP:  Dettinger, Elige Radon, MD  Cardiologist:   Thurmon Fair, MD   Chief Complaint  Patient presents with  . Pre-op Exam    History of Present Illness:  Danny Hinton is a 39 y.o. male who presents for nonischemic cardiomyopathy, chronic systolic heart failure, defibrillator checkup, history of atrial flutter, atrial fibrillation and paroxysmal atrial tachycardia.  His arrhythmia settled down.  He has not had significant palpitations during the last month.  Interrogation of his device shows that he had atrial tachycardia September 13-15, none since then.  He denies angina, dyspnea with usual activity, palpitations, dizziness or syncope.  Occasionally his systolic blood pressure will be in the high 90s, but this does not bother him.  He only becomes weak and dizzy if his systolic blood pressure is less than 95 or so.  He has enrolled in an intensive weight loss program. He is being evaluated for gastric sleeve surgery by Dr. Franki Cabot at [facility].  Surgery is tentatively scheduled for early December.  He has already received a prescription for Lovenox as a "bridge" for warfarin interruption around the time of surgery.  ICD interrogation shows normal findings.  Estimated generator longevity is only another 3 months.  His Saint Jude fortify ICD was implanted in 2011 (the generator is under advisory for possible unexpected battery depletion, but has shown normal behavior).  He does not require atrial pacing or ventricular pacing and has not had recent episodes of mode switch.  Heart rate histogram distribution is consistent with sedentary lifestyle.  His thoracic impedance has been stable, without recent evidence of fluid overload.  His wife has multiple sclerosis on immunosuppressant drugs.   His September 2019  echocardiogram showed an ejection fraction of around 40%.  In September 2015 he underwent cavotricuspid isthmus ablation for atrial flutter. On 08/19/2014, while [place] hunting he develop persistent rapid palpitations and felt unwell. He underwent successful overdrive pacing via his device by Dr. Hillis Range. The rhythm was atypical atrial flutter, cycle length roughly 390 ms. The episode lasted for about 3 hours until he was successfully overdrive paced. In the past he has had paroxysmal atrial fibrillation. The decision was made to ablate his flutter secondary to the occurrence of multiple unnecessary defibrillator shocks in the setting of atrial flutter with rapid ventricular rate.  His St. Jude Fortify DR model [id number] defibrillator is under advisory for possible unexpected battery depletion. He is not device dependent and has never had shocks for true ventricular arrhythmia (although he has had inappropriate shocks for atrial flutter with rapid AV conduction). We discussed the potential for device failure in a low percentage of this particular model. There is no reason to proceed with early generator change out in his particular case.   Past Medical History:  Diagnosis Date  . Anxiety   . Atypical atrial flutter (HCC) 08/19/2014   Pace terminated in device clinic.  AFL cycle length was 390 msec.  Marland Kitchen Automatic implantable cardioverter-defibrillator in situ    a. St Jude in 2011.  . B12 nutritional deficiency, B12 = 214 (03/18/19) 04/29/2019  . Chronic anticoagulation with Coumadin   . Chronic systolic congestive heart failure (HCC)    a. suspected NICM - dx 2010. EF 15% by TEE, 10-20% by echo at  that time. b. s/p St. Jude AICD 2011. c. Echo 01/2010: mod dilated LV, EF 30%, mod aortic root dilitation, no significant valvular disease.  . Depression   . Diabetes mellitus (HCC), Rx Victoza 04/29/2019  . Dyslipidemia associated with type 2 diabetes mellitus (HCC), Rx Lipitor 04/29/2019  . Eczema    . Enlarged aorta (HCC)   . Hypertension associated with diabetes (HCC)   . Mobitz type 2 second degree atrioventricular block    a. During sleeping hours in 2010 suspected due to OSA.  . Morbid obesity (HCC)   . Nonischemic cardiomyopathy (HCC)   . OSA treated with BiPAP 09/04/2018   Severe obstructive sleep apnea with an AHI of 67.2/h and nocturnal hypoxemia with oxygen saturations as low as 82%. Now on BIPAP at 18/14 cm H2O.  Marland Kitchen PAF (paroxysmal atrial fibrillation) (HCC)   . Palpitations   . Paroxysmal atrial flutter (HCC), s/p ablation   . Seasonal allergies   . Sleep apnea   . Thoracic aortic aneurysm (HCC), monitored annually 04/29/2019  . Vitamin D deficiency, vitamin D = 6.9 (03/28/19) 04/29/2019    Past Surgical History:  Procedure Laterality Date  . ATRIAL FLUTTER ABLATION N/A 01/18/2014   Procedure: ATRIAL FLUTTER ABLATION;  Surgeon: Duke Salvia, MD;  Location: [facility] CATH LAB;  Service: Cardiovascular;  Laterality: N/A;  . CARDIAC DEFIBRILLATOR PLACEMENT  02/17/10   St. Jude Medical 45DR, model number [id number], serial number [id number]  . CARDIOVERSION N/A 12/29/2013   Procedure: CARDIOVERSION;  Surgeon: Duke Salvia, MD;  Location: [facility] OR;  Service: Cardiovascular;  Laterality: N/A;  . TOOTH EXTRACTION N/A 10/23/2012   Procedure: EXTRACTION TEETH 1, 16, 17, 30, 31;  Surgeon: [name], DDS;  Location: [facility] OR;  Service: Oral Surgery;  Laterality: N/A;    Outpatient Medications Prior to Visit  Medication Sig Dispense Refill  . ALPRAZolam (XANAX) 1 MG tablet Take 1 tablet (1 mg total) by mouth 3 (three) times daily as needed for anxiety. 30 tablet 0  . atorvastatin (LIPITOR) 40 MG tablet Take 1 tablet (40 mg total) by mouth daily. 90 tablet 0  . buPROPion (WELLBUTRIN XL) 150 MG 24 hr tablet Take 1 tablet (150 mg total) by mouth daily. 90 tablet 3  . carvedilol (COREG) 25 MG tablet Take 2 tablets (50 mg total) by mouth 2 (two) times daily with a meal. 120 tablet 3  .  digoxin (LANOXIN) 0.125 MG tablet Take 1 tablet (125 mcg total) by mouth daily. 30 tablet 3  . ENTRESTO 49-51 MG Take 1 tablet by mouth twice daily 180 tablet 2  . FLUoxetine (PROZAC) 20 MG tablet Take 1 tablet (20 mg total) by mouth daily. 30 tablet 3  . fluticasone (FLONASE) 50 MCG/ACT nasal spray Place 1 spray into both nostrils as needed. 16 g 11  . furosemide (LASIX) 20 MG tablet Take 1 tablet (20 mg total) by mouth every other day. 45 tablet 0  . Insulin Pen Needle (BD PEN NEEDLE NANO 2ND GEN) 32G X 4 MM MISC 1 Package by Does not apply route 2 (two) times daily. 100 each 0  . liraglutide (VICTOZA) 18 MG/3ML SOPN Inject 0.3 mLs (1.8 mg total) into the skin daily. 9 mL 1  . loperamide (IMODIUM A-D) 2 MG tablet Take as needed 30 tablet 0  . loratadine (EQ LORATADINE) 10 MG tablet Take 1 tablet (10 mg total) by mouth daily. 90 tablet 0  . metoprolol tartrate (LOPRESSOR) 25 MG tablet Take 1 tablet as  needed for palpitations 90 tablet 3  . ondansetron (ZOFRAN) 4 MG tablet Take 1 tablet (4 mg total) by mouth every 8 (eight) hours as needed for nausea or vomiting. 20 tablet 0  . Probiotic Product (VSL#3) CAPS Take twice a day    . Vitamin D, Ergocalciferol, (DRISDOL) 1.25 MG (50000 UNIT) CAPS capsule Take 1 capsule (50,000 Units total) by mouth every 7 (seven) days. 12 capsule 0  . warfarin (COUMADIN) 5 MG tablet TAKE 1 TABLET BY MOUTH ONCE DAILY EXCEPT  TAKE  1  &  1/2  TABLET  EACH  FRIDAY  OR  AS  DIRECTED  BY  ANTICOAGULATION  CLINIC 100 tablet 3   No facility-administered medications prior to visit.     Allergies:   Patient has no known allergies.   Social History   Socioeconomic History  . Marital status: Married    Spouse name: Not on file  . Number of children: 1  . Years of education: Not on file  . Highest education level: Not on file  Occupational History  . Occupation: disabled  Tobacco Use  . Smoking status: Never Smoker  . Smokeless tobacco: Never Used  Vaping Use  .  Vaping Use: Never used  Substance and Sexual Activity  . Alcohol use: No  . Drug use: No  . Sexual activity: Yes  Other Topics Concern  . Not on file  Social History Narrative  . Not on file   Social Determinants of Health   Financial Resource Strain:   . Difficulty of Paying Living Expenses: Not on file  Food Insecurity:   . Worried About Programme researcher, broadcasting/film/video in the Last Year: Not on file  . Ran Out of Food in the Last Year: Not on file  Transportation Needs:   . Lack of Transportation (Medical): Not on file  . Lack of Transportation (Non-Medical): Not on file  Physical Activity:   . Days of Exercise per Week: Not on file  . Minutes of Exercise per Session: Not on file  Stress:   . Feeling of Stress : Not on file  Social Connections:   . Frequency of Communication with Friends and Family: Not on file  . Frequency of Social Gatherings with Friends and Family: Not on file  . Attends Religious Services: Not on file  . Active Member of Clubs or Organizations: Not on file  . Attends Banker Meetings: Not on file  . Marital Status: Not on file     Family History:  The patient's family history includes Anxiety disorder in his mother; Arrhythmia in his mother; Colon polyps in his father; Depression in his mother; Diabetes in his paternal grandmother; Heart disease in his maternal grandfather and mother; Hodgkin's lymphoma (age of onset: 73) in his brother; Irritable bowel syndrome in his maternal aunt.   ROS:   Please see the history of present illness.    ROS All other systems are reviewed and are negative.   PHYSICAL EXAM:   VS:  BP (!) 102/56   Pulse 64   Ht 6\' 3"  (1.905 m)   Wt (!) 433 lb (196.4 kg)   BMI 54.12 kg/m      General: Alert, oriented x3, no distress, super obese.  Healthy subclavian defibrillator site Head: no evidence of trauma, PERRL, EOMI, no exophtalmos or lid lag, no myxedema, no xanthelasma; normal ears, nose and oropharynx Neck: normal  jugular venous pulsations and no hepatojugular reflux; brisk carotid pulses without delay and  no carotid bruits Chest: clear to auscultation, no signs of consolidation by percussion or palpation, normal fremitus, symmetrical and full respiratory excursions Cardiovascular: normal position and quality of the apical impulse, regular rhythm, normal first and second heart sounds, no murmurs, rubs or gallops Abdomen: no tenderness or distention, no masses by palpation, no abnormal pulsatility or arterial bruits, normal bowel sounds, no hepatosplenomegaly Extremities: no clubbing, cyanosis or edema; 2+ radial, ulnar and brachial pulses bilaterally; 2+ right femoral, posterior tibial and dorsalis pedis pulses; 2+ left femoral, posterior tibial and dorsalis pedis pulses; no subclavian or femoral bruits Neurological: grossly nonfocal Psych: Normal mood and affect    Wt Readings from Last 3 Encounters:  02/08/20 (!) 433 lb (196.4 kg)  02/05/20 (!) 430 lb (195 kg)  11/06/19 (!) 458 lb (207.7 kg)      Studies/Labs Reviewed:   ECHO 01/27/2018: - Left ventricle: The cavity size was mildly dilated. There was mild concentric hypertrophy. Systolic function was normal. The estimated ejection fraction was 40%. Wall motion was normal; there were no regional wall motion abnormalities. Doppler parameters are consistent with abnormal left ventricular relaxation (grade 1 diastolic dysfunction). There was no evidence of elevated ventricular filling pressure by Doppler parameters. - Aortic valve: There was no regurgitation. - Aortic root: The aortic root was normal in size. - Mitral valve: There was mild regurgitation. Valve area by pressure half-time: 2.22 cm^2. - Left atrium: The atrium was mildly dilated. - Right ventricle: Pacer wire or catheter noted in right ventricle. Systolic function was normal. - Right atrium: Pacer wire or catheter noted in right atrium. - Tricuspid valve: There was mild regurgitation. -  Pulmonic valve: There was mild regurgitation. - Inferior vena cava: The vessel was normal in size. - Pericardium, extracardiac: There was no pericardial effusion.  Impressions:  - There has been no significant change since the prior study on 07/31/2015, LVEF remains moderately decreased estimated at 40%.   Aortic root is dilated at the sinus level with maximum diameter 45 mm. Ascending aortic size is stable at 40 mm.  EKG:  EKG from 03/28/2019 shows NSR Recent Labs: 03/28/2019: TSH 2.530 07/30/2019: Hemoglobin 11.8; Platelets 261 11/05/2019: ALT 30; BUN 12; Creatinine, Ser 0.97; Magnesium 2.0; Potassium 4.2; Sodium 139   Lipid Panel    Component Value Date/Time   CHOL 133 07/30/2019 1131   TRIG 86 07/30/2019 1131   HDL 40 07/30/2019 1131   CHOLHDL 3.3 07/30/2019 1131   CHOLHDL 5.3 03/20/2009 0410   VLDL 22 03/20/2009 0410   LDLCALC 76 07/30/2019 1131   LDLDIRECT 52 03/26/2015 0824     ASSESSMENT:    1. Preoperative cardiovascular examination   2. Chronic combined systolic and diastolic heart failure (HCC)   3. Non-ischemic cardiomyopathy (HCC)   4. PAT (paroxysmal atrial tachycardia) (HCC)   5. Long term current use of anticoagulant therapy, Coumadin   6. ICD (implantable cardioverter-defibrillator), dual, in situ   7. Super obesity   8. Mixed hyperlipidemia   9. Diabetes mellitus type 2 in obese (HCC)   10. Essential hypertension      PLAN:  In order of problems listed above:  1. Preoperative cardiovascular examination: Although Danny has numerous cardiovascular problems, they have been well compensated recently.  Although these problems do increase his risk of complications, I would estimate those to be in the low to moderate range.  I think the long-term benefit from substantial weight loss will definitely help balance the risk of cardiovascular complications around the time of  surgery.  It will be very important to avoid interruption in his beta-blocker  perioperatively.  Intravenous metoprolol can be administered to substitute this if he is unable to take p.o. medications immediately postop.  (Would recommend starting with 5 mg IV every 4 hours, probably will then need to increase to 10 mg IV every 4 hours, dose adjusted for blood pressure and heart rate).  Defibrillator therapies can be turned "off" at the time of his surgery.  He is not pacemaker dependent.   2. CHF: NYHA functional class 2-3.  Appears clinically euvolemic and his thoracic impedance on his defibrillator is also stable).  He is on highest tolerated doses of carvedilol and Entresto as well as digoxin.  Consider starting an SGLT2 inhibitor, but will wait until after his gastric sleeve procedure. 3. CMP: Nonischemic mechanism. Most recent LV ejection fraction 40%.   4. Paroxysmal atrial tachycardia/atypical flutter/AFib: with history of previous cavotricuspid isthmus flutter ablation and subsequent episodes of symptomatic paroxysmal atrial tachycardia with 1: 1 AV conduction.  He has discussed further therapy with Dr. Elberta Fortis.  At his current weight he is not a candidate for ablation.  Antiarrhythmics were reviewed but for the time being he will remain on beta-blocker therapy only. 5. Warfarin:  Has a home monitor,, but was out of strips and has not had his INR checked in 2 months.  Need to reenroll him in the home monitoring program.  INR today was 2.1. 6. AICD advisory: Anticipate need for generator change out before the end of the year.  Device function is normal.  His St Jude device is under advisory for potential rapid battery depletion, but it in his particular case this is unlikely to be a serious issue.  He is not pacemaker dependent and has not required appropriate defibrillator therapy for ventricular tachyarrhythmia. Will continue with frequent battery checks, but no plan for early generator change out at this time. 7. Obesity: Tentative plan for gastric sleeve surgery in December of  this year. 8. HLP: Borderline low HDL, but otherwise with excellent lipid parameters. 9. DM: Good candidate for Jardiance or Farxiga, but still trying to figure out how to work in a arrange coverage for these financially.  We will probably delay starting his medications until after his gastric sleeve procedure since there will also be implications for glucose management. 10. HTN: Blood pressure remains relatively low but he does not have any symptoms of dizziness or weakness.  Medication Adjustments/Labs and Tests Ordered: Current medicines are reviewed at length with the patient today.  Concerns regarding medicines are outlined above.  Medication changes, Labs and Tests ordered today are listed in the Patient Instructions below. Patient Instructions  Medication Instructions:  No changes *If you need a refill on your cardiac medications before your next appointment, please call your pharmacy*   Lab Work: None ordered If you have labs (blood work) drawn today and your tests are completely normal, you will receive your results only by: Marland Kitchen MyChart Message (if you have MyChart) OR . A paper copy in the mail If you have any lab test that is abnormal or we need to change your treatment, we will call you to review the results.   Testing/Procedures: None ordered   Follow-Up: At [facility], you and your health needs are our priority.  As part of our continuing mission to provide you with exceptional heart care, we have created designated Provider Care Teams.  These Care Teams include your primary Cardiologist (physician) and Advanced Practice  Providers (APPs -  Physician Assistants and Nurse Practitioners) who all work together to provide you with the care you need, when you need it.  We recommend signing up for the patient portal called "MyChart".  Sign up information is provided on this After Visit Summary.  MyChart is used to connect with patients for Virtual Visits (Telemedicine).  Patients  are able to view lab/test results, encounter notes, upcoming appointments, etc.  Non-urgent messages can be sent to your provider as well.   To learn more about what you can do with MyChart, go to [email or web address].    Your next [email or web address]  The format for your next appointment:   In Person  Provider:   Thurmon Fair, MD         Signed, Thurmon Fair, MD  02/08/2020 1:15 PM    [facility] [address] [place], [place], [place]  [address] Phone: [phone]; Fax: [phone]

## 2020-02-08 NOTE — Patient Instructions (Signed)
Medication Instructions:  No changes *If you need a refill on your cardiac medications before your next appointment, please call your pharmacy*   Lab Work: None ordered If you have labs (blood work) drawn today and your tests are completely normal, you will receive your results only by: . MyChart Message (if you have MyChart) OR . A paper copy in the mail If you have any lab test that is abnormal or we need to change your treatment, we will call you to review the results.   Testing/Procedures: None ordered   Follow-Up: At [facility], you and your health needs are our priority.  As part of our continuing mission to provide you with exceptional heart care, we have created designated Provider Care Teams.  These Care Teams include your primary Cardiologist (physician) and Advanced Practice Providers (APPs -  Physician Assistants and Nurse Practitioners) who all work together to provide you with the care you need, when you need it.  We recommend signing up for the patient portal called "MyChart".  Sign up information is provided on this After Visit Summary.  MyChart is used to connect with patients for Virtual Visits (Telemedicine).  Patients are able to view lab/test results, encounter notes, upcoming appointments, etc.  Non-urgent messages can be sent to your provider as well.   To learn more about what you can do with MyChart, go to [email or web address].    Your next appointment:   3 month(s)  The format for your next appointment:   In Person  Provider:   Mihai Croitoru, MD   

## 2020-02-08 NOTE — Patient Instructions (Signed)
continue to take 1.5 tablets each Monday, Wednesday, and Friday 1 tablet all other days of the week.  Self test 2 weeks

## 2020-02-21 NOTE — Telephone Encounter (Signed)
Seen by Dr. Royann Shivers 02/08/20.

## 2020-03-03 ENCOUNTER — Ambulatory Visit (INDEPENDENT_AMBULATORY_CARE_PROVIDER_SITE_OTHER): Payer: Medicaid Other

## 2020-03-03 DIAGNOSIS — I428 Other cardiomyopathies: Secondary | ICD-10-CM

## 2020-03-04 DIAGNOSIS — I4892 Unspecified atrial flutter: Secondary | ICD-10-CM | POA: Diagnosis not present

## 2020-03-04 DIAGNOSIS — K449 Diaphragmatic hernia without obstruction or gangrene: Secondary | ICD-10-CM | POA: Diagnosis not present

## 2020-03-04 DIAGNOSIS — E119 Type 2 diabetes mellitus without complications: Secondary | ICD-10-CM | POA: Diagnosis not present

## 2020-03-04 DIAGNOSIS — I509 Heart failure, unspecified: Secondary | ICD-10-CM | POA: Diagnosis not present

## 2020-03-04 DIAGNOSIS — Z95810 Presence of automatic (implantable) cardiac defibrillator: Secondary | ICD-10-CM | POA: Diagnosis not present

## 2020-03-04 DIAGNOSIS — I10 Essential (primary) hypertension: Secondary | ICD-10-CM | POA: Diagnosis not present

## 2020-03-04 DIAGNOSIS — I4891 Unspecified atrial fibrillation: Secondary | ICD-10-CM | POA: Diagnosis not present

## 2020-03-04 DIAGNOSIS — E782 Mixed hyperlipidemia: Secondary | ICD-10-CM | POA: Diagnosis not present

## 2020-03-04 DIAGNOSIS — G4733 Obstructive sleep apnea (adult) (pediatric): Secondary | ICD-10-CM | POA: Diagnosis not present

## 2020-03-05 ENCOUNTER — Telehealth: Payer: Self-pay

## 2020-03-05 LAB — CUP PACEART REMOTE DEVICE CHECK
Battery Remaining Longevity: 3 mo
Battery Remaining Percentage: 3 %
Battery Voltage: 2.6 V
Brady Statistic AP VP Percent: 1 %
Brady Statistic AP VS Percent: 1 %
Brady Statistic AS VP Percent: 1 %
Brady Statistic AS VS Percent: 99 %
Brady Statistic RA Percent Paced: 1 %
Brady Statistic RV Percent Paced: 1 %
Date Time Interrogation Session: 20211026155512
HighPow Impedance: 44 Ohm
Implantable Lead Implant Date: 20111010
Implantable Lead Implant Date: 20111010
Implantable Lead Location: 753859
Implantable Lead Location: 753860
Implantable Pulse Generator Implant Date: 20111010
Lead Channel Impedance Value: 390 Ohm
Lead Channel Impedance Value: 460 Ohm
Lead Channel Pacing Threshold Amplitude: 0.75 V
Lead Channel Pacing Threshold Amplitude: 1.5 V
Lead Channel Pacing Threshold Pulse Width: 0.5 ms
Lead Channel Pacing Threshold Pulse Width: 0.7 ms
Lead Channel Sensing Intrinsic Amplitude: 11.7 mV
Lead Channel Sensing Intrinsic Amplitude: 5 mV
Lead Channel Setting Pacing Amplitude: 2.5 V
Lead Channel Setting Pacing Amplitude: 3.5 V
Lead Channel Setting Pacing Pulse Width: 0.5 ms
Lead Channel Setting Sensing Sensitivity: 0.5 mV
Pulse Gen Serial Number: 621444

## 2020-03-05 NOTE — Telephone Encounter (Signed)
° °  [facility] Pre-operative Risk Assessment    HEARTCARE STAFF: - Please ensure there is not already an duplicate clearance open for this procedure. - Under Visit Info/Reason for Call, type in Other and utilize the format Clearance MM/DD/YY or Clearance TBD. Do not use dashes or single digits. - If request is for dental extraction, please clarify the # of teeth to be extracted.  Request for surgical clearance:  What type of surgery is being performed? SGB-Sleeve Gastric Banding  1. When is this surgery scheduled? TBD  2. What type of clearance is required (medical clearance vs. Pharmacy clearance to hold med vs. Both)?  Coumadin   3. Are there any medications that need to be held prior to surgery and how long? Per Dr. Volanda Napoleon "Will hold Coumadin for 1 week pre-op and convert him to Lovenox pre-op. What dose of Lovenox would you like him on, based on his weight?"  4. Practice name and name of physician performing surgery? [facility], Dr. Norris Cross  5. What is the office phone number? [phone]   7.   What is the office fax number? [phone]  8.   Anesthesia type (None, local, MAC, general) ? General     Jacqulynn Cadet 03/05/2020, 8:53 AM  _________________________________________________________________   (provider comments below)

## 2020-03-06 NOTE — Telephone Encounter (Signed)
Robin from [facility]. She gave me the information needed to update the clearance. Surgery date TBD more like January 2022. Weight for Lovenox dose will be based off of weight when scheduled for surgery and the anesthesia type will be general.

## 2020-03-06 NOTE — Telephone Encounter (Signed)
Patient with diagnosis of afib on warfarin for anticoagulation.    Procedure: laparoscopic sleeve gastric bypass Date of procedure: TBD    CHA2DS2-VASc Score = 3  This indicates a 3.2% annual risk of stroke. The patient's score is based upon: CHF History: 1 HTN History: 1 Diabetes History: 1 Stroke History: 0 Vascular Disease History: 0 Age Score: 0 Gender Score: 0      CrCl 188 ml/min  Per office protocol, patient can hold warfarin for 5 days prior to procedure.   Per our protocol, patient does NOT need bridging with therapeutic lovenox.  It appears Dr. Clent Ridges is suggesting prophylactic lovenox. This is ok if surgeon desires, but not required. I would recommend 40mg  BID due to patients BMI >40 if we move forward with prophylactic dosing.    I will send to Dr. for additional input.p

## 2020-03-06 NOTE — Addendum Note (Signed)
Addended by: Elease Etienne A on: 03/06/2020 09:33 AM   Modules accepted: Level of Service

## 2020-03-06 NOTE — Progress Notes (Signed)
Remote ICD transmission.   

## 2020-03-07 DIAGNOSIS — I10 Essential (primary) hypertension: Secondary | ICD-10-CM | POA: Diagnosis not present

## 2020-03-07 DIAGNOSIS — E782 Mixed hyperlipidemia: Secondary | ICD-10-CM | POA: Diagnosis not present

## 2020-03-07 DIAGNOSIS — Z713 Dietary counseling and surveillance: Secondary | ICD-10-CM | POA: Diagnosis not present

## 2020-03-07 DIAGNOSIS — G4733 Obstructive sleep apnea (adult) (pediatric): Secondary | ICD-10-CM | POA: Diagnosis not present

## 2020-03-07 NOTE — Telephone Encounter (Signed)
Dr[place] Royann [place]ivers [place]

## 2020-03-11 ENCOUNTER — Ambulatory Visit (INDEPENDENT_AMBULATORY_CARE_PROVIDER_SITE_OTHER): Payer: Medicaid Other | Admitting: *Deleted

## 2020-03-11 DIAGNOSIS — I480 Paroxysmal atrial fibrillation: Secondary | ICD-10-CM | POA: Diagnosis not present

## 2020-03-11 DIAGNOSIS — Z5181 Encounter for therapeutic drug level monitoring: Secondary | ICD-10-CM

## 2020-03-11 LAB — POCT INR: INR: 2 (ref 2.0–3.0)

## 2020-03-11 NOTE — Patient Instructions (Signed)
Increase warfarin to 1 1/2 tablets daily except 1 tablet on Mondays and Thursdays. Recheck in 4 weeks

## 2020-03-12 ENCOUNTER — Other Ambulatory Visit: Payer: Self-pay | Admitting: *Deleted

## 2020-03-12 ENCOUNTER — Other Ambulatory Visit: Payer: Self-pay | Admitting: Cardiovascular Disease

## 2020-03-12 ENCOUNTER — Telehealth: Payer: Self-pay

## 2020-03-12 DIAGNOSIS — Z95810 Presence of automatic (implantable) cardiac defibrillator: Secondary | ICD-10-CM | POA: Diagnosis not present

## 2020-03-12 DIAGNOSIS — I509 Heart failure, unspecified: Secondary | ICD-10-CM | POA: Diagnosis not present

## 2020-03-12 DIAGNOSIS — E119 Type 2 diabetes mellitus without complications: Secondary | ICD-10-CM | POA: Diagnosis not present

## 2020-03-12 DIAGNOSIS — E782 Mixed hyperlipidemia: Secondary | ICD-10-CM | POA: Diagnosis not present

## 2020-03-12 DIAGNOSIS — I10 Essential (primary) hypertension: Secondary | ICD-10-CM | POA: Diagnosis not present

## 2020-03-12 DIAGNOSIS — I428 Other cardiomyopathies: Secondary | ICD-10-CM

## 2020-03-12 DIAGNOSIS — I4891 Unspecified atrial fibrillation: Secondary | ICD-10-CM | POA: Diagnosis not present

## 2020-03-12 DIAGNOSIS — Z6841 Body Mass Index (BMI) 40.0 and over, adult: Secondary | ICD-10-CM | POA: Diagnosis not present

## 2020-03-12 DIAGNOSIS — I480 Paroxysmal atrial fibrillation: Secondary | ICD-10-CM

## 2020-03-12 DIAGNOSIS — E559 Vitamin D deficiency, unspecified: Secondary | ICD-10-CM | POA: Diagnosis not present

## 2020-03-12 DIAGNOSIS — I493 Ventricular premature depolarization: Secondary | ICD-10-CM

## 2020-03-12 DIAGNOSIS — G4733 Obstructive sleep apnea (adult) (pediatric): Secondary | ICD-10-CM | POA: Diagnosis not present

## 2020-03-12 DIAGNOSIS — I4892 Unspecified atrial flutter: Secondary | ICD-10-CM | POA: Diagnosis not present

## 2020-03-12 NOTE — Telephone Encounter (Signed)
Patient called to see if we received transmission. Advised patient we did and reminded of RRT. Advised to call with questions or concerns. Verbalized understanding.

## 2020-03-12 NOTE — Telephone Encounter (Signed)
Patient called to advised ICD has reached RRT 03/10/20. Discussed patient will need gen change within the next 3 months.  Answered questions, advised patient to call with further questions or concerns. Verbalized understanding.

## 2020-03-13 NOTE — Telephone Encounter (Signed)
Calling patient in regards to assisting send a transmission. Patient states he did not need to send another. Advised patient if we can help him to please give [place] a call. Verbalizes understanding.

## 2020-03-14 ENCOUNTER — Encounter: Payer: Self-pay | Admitting: *Deleted

## 2020-03-17 ENCOUNTER — Encounter: Payer: Self-pay | Admitting: *Deleted

## 2020-03-18 NOTE — Telephone Encounter (Signed)
   Primary Cardiologist: Thurmon Fair, MD  Chart reviewed as part of pre-operative protocol coverage.   Per Dr. Royann Shivers: Patient does NOT need lovenox bridging. I am concerned that we would not dose lovenox accurately for body weight (>400 lb) and increase his risk of bleeding if we tried to do full "bridging". His CHADSVasc score is relatively low. I would stop warfarin 5-7 days preop and resume warfarin postop. Would use DVT prophylaxis dose lovenox only in the interim.   I will route this recommendation to the requesting party via Epic fax function and remove from pre-op pool.  Please call with questions.  Beatriz Stallion, PA-C 03/18/2020, 9:31 AM

## 2020-03-18 NOTE — Telephone Encounter (Signed)
In my opinion, he does NOT need lovenox bridging. I am concerned that we would not dose lovenox accurately for body weight (>400 lb) and increase his risk of bleeding if we tried to do full "bridging". His CHADSVasc score is relatively low. I would stop warfarin 5-7 days preop and resume warfarin postop. Would use DVT prophylaxis dose lovenox only in the interim.

## 2020-03-20 ENCOUNTER — Other Ambulatory Visit (INDEPENDENT_AMBULATORY_CARE_PROVIDER_SITE_OTHER): Payer: Self-pay | Admitting: Family Medicine

## 2020-03-20 ENCOUNTER — Other Ambulatory Visit (HOSPITAL_COMMUNITY)
Admission: RE | Admit: 2020-03-20 | Discharge: 2020-03-20 | Disposition: A | Discharge status: Home / Self Care | Payer: Medicaid Other | Source: Ambulatory Visit | Attending: Cardiovascular Disease | Admitting: Cardiovascular Disease

## 2020-03-20 ENCOUNTER — Other Ambulatory Visit: Payer: Self-pay | Admitting: *Deleted

## 2020-03-20 ENCOUNTER — Encounter: Payer: Self-pay | Admitting: Family Medicine

## 2020-03-20 ENCOUNTER — Other Ambulatory Visit: Payer: Self-pay

## 2020-03-20 DIAGNOSIS — E119 Type 2 diabetes mellitus without complications: Secondary | ICD-10-CM

## 2020-03-20 DIAGNOSIS — I480 Paroxysmal atrial fibrillation: Secondary | ICD-10-CM | POA: Diagnosis not present

## 2020-03-20 DIAGNOSIS — Z01810 Encounter for preprocedural cardiovascular examination: Secondary | ICD-10-CM

## 2020-03-20 DIAGNOSIS — Z01812 Encounter for preprocedural laboratory examination: Secondary | ICD-10-CM | POA: Insufficient documentation

## 2020-03-20 DIAGNOSIS — Z20822 Contact with and (suspected) exposure to covid-19: Secondary | ICD-10-CM | POA: Insufficient documentation

## 2020-03-20 NOTE — Telephone Encounter (Signed)
Patient last seen by Dr.Wallace. °

## 2020-03-21 ENCOUNTER — Other Ambulatory Visit: Payer: Self-pay | Admitting: *Deleted

## 2020-03-21 ENCOUNTER — Other Ambulatory Visit: Payer: Self-pay | Admitting: Family Medicine

## 2020-03-21 DIAGNOSIS — Z95810 Presence of automatic (implantable) cardiac defibrillator: Secondary | ICD-10-CM

## 2020-03-21 DIAGNOSIS — E119 Type 2 diabetes mellitus without complications: Secondary | ICD-10-CM

## 2020-03-21 LAB — BASIC METABOLIC PANEL
BUN/Creatinine Ratio: 12 (ref 9–20)
BUN: 11 mg/dL (ref 6–20)
CO2: 24 mmol/L (ref 20–29)
Calcium: 9 mg/dL (ref 8.7–10.2)
Chloride: 102 mmol/L (ref 96–106)
Creatinine, Ser: 0.94 mg/dL (ref 0.76–1.27)
GFR calc Af Amer: 118 mL/min/{1.73_m2} (ref >59)
GFR calc non Af Amer: 102 mL/min/{1.73_m2} (ref >59)
Glucose: 80 mg/dL (ref 65–99)
Potassium: 4.2 mmol/L (ref 3.5–5.2)
Sodium: 138 mmol/L (ref 134–144)

## 2020-03-21 LAB — PROTIME-INR
INR: 2.3 — ABNORMAL HIGH (ref 0.9–1.2)
Prothrombin Time: 23 s — ABNORMAL HIGH (ref 9.1–12.0)

## 2020-03-21 LAB — CBC
Hematocrit: 37.6 % (ref 37.5–51.0)
Hemoglobin: 11.9 g/dL — ABNORMAL LOW (ref 13.0–17.7)
MCH: 25.5 pg — ABNORMAL LOW (ref 26.6–33.0)
MCHC: 31.6 g/dL (ref 31.5–35.7)
MCV: 81 fL (ref 79–97)
Platelets: 292 10*3/uL (ref 150–450)
RBC: 4.67 x10E6/uL (ref 4.14–5.80)
RDW: 14.4 % (ref 11.6–15.4)
WBC: 4.2 10*3/uL (ref 3.4–10.8)

## 2020-03-21 LAB — SARS CORONAVIRUS 2 (TAT 6-24 HRS): SARS Coronavirus 2: NEGATIVE

## 2020-03-21 MED ORDER — VICTOZA 18 MG/3ML ~~LOC~~ SOPN: 1.8000 mg | PEN_INJECTOR | Freq: Every day | SUBCUTANEOUS | 0 refills | Status: DC

## 2020-03-21 NOTE — Telephone Encounter (Signed)
  Prescription Request  03/21/2020  What is the name of the medication or equipment? Liraglutide 18 mf/106ml - Patient has appt 11-19 with Dettinger and he is out of his medication  Have you contacted your pharmacy to request a refill? (if applicable) NO  Which pharmacy would you like this sent to? [place]   Patient notified that their request is being sent to the clinical staff for review and that they should receive a response within 2 business days.

## 2020-03-21 NOTE — Telephone Encounter (Signed)
LMOVM refill sent to pharmacy 

## 2020-03-24 ENCOUNTER — Encounter: Payer: Medicaid Other | Admitting: Cardiovascular Disease

## 2020-03-24 ENCOUNTER — Encounter (HOSPITAL_COMMUNITY)
Admission: RE | Disposition: A | Discharge status: Home / Self Care | Payer: Self-pay | Source: Home / Self Care | Attending: Cardiovascular Disease

## 2020-03-24 ENCOUNTER — Ambulatory Visit (HOSPITAL_COMMUNITY)
Admission: RE | Admit: 2020-03-24 | Discharge: 2020-03-24 | Disposition: A | Discharge status: Home / Self Care | Payer: Medicaid Other | Attending: Cardiovascular Disease | Admitting: Cardiovascular Disease

## 2020-03-24 DIAGNOSIS — I110 Hypertensive heart disease with heart failure: Secondary | ICD-10-CM | POA: Insufficient documentation

## 2020-03-24 DIAGNOSIS — I428 Other cardiomyopathies: Secondary | ICD-10-CM

## 2020-03-24 DIAGNOSIS — Z794 Long term (current) use of insulin: Secondary | ICD-10-CM | POA: Insufficient documentation

## 2020-03-24 DIAGNOSIS — Z7901 Long term (current) use of anticoagulants: Secondary | ICD-10-CM | POA: Insufficient documentation

## 2020-03-24 DIAGNOSIS — Z006 Encounter for examination for normal comparison and control in clinical research program: Secondary | ICD-10-CM | POA: Insufficient documentation

## 2020-03-24 DIAGNOSIS — Z8249 Family history of ischemic heart disease and other diseases of the circulatory system: Secondary | ICD-10-CM | POA: Insufficient documentation

## 2020-03-24 DIAGNOSIS — Z4502 Encounter for adjustment and management of automatic implantable cardiac defibrillator: Secondary | ICD-10-CM

## 2020-03-24 DIAGNOSIS — I5042 Chronic combined systolic (congestive) and diastolic (congestive) heart failure: Secondary | ICD-10-CM | POA: Diagnosis present

## 2020-03-24 DIAGNOSIS — I5022 Chronic systolic (congestive) heart failure: Secondary | ICD-10-CM | POA: Diagnosis not present

## 2020-03-24 DIAGNOSIS — Z79899 Other long term (current) drug therapy: Secondary | ICD-10-CM | POA: Insufficient documentation

## 2020-03-24 DIAGNOSIS — Z95810 Presence of automatic (implantable) cardiac defibrillator: Secondary | ICD-10-CM

## 2020-03-24 HISTORY — PX: ICD GENERATOR CHANGEOUT: EP1231

## 2020-03-24 LAB — GLUCOSE, CAPILLARY: Glucose-Capillary: 108 mg/dL — ABNORMAL HIGH (ref 70–99)

## 2020-03-24 LAB — PROTIME-INR
INR: 1.4 — ABNORMAL HIGH (ref 0.8–1.2)
Prothrombin Time: 16.8 seconds — ABNORMAL HIGH (ref 11.4–15.2)

## 2020-03-24 SURGERY — ICD GENERATOR CHANGEOUT

## 2020-03-24 MED ORDER — ONDANSETRON HCL 4 MG/2ML IJ SOLN: 4.0000 mg | Freq: Four times a day (QID) | INTRAMUSCULAR | Status: DC | PRN

## 2020-03-24 MED ORDER — SODIUM CHLORIDE 0.9% FLUSH: 3.0000 mL | Freq: Two times a day (BID) | INTRAVENOUS | Status: DC

## 2020-03-24 MED ORDER — SODIUM CHLORIDE 0.9 % IV SOLN: 250.0000 mL | INTRAVENOUS | Status: DC | PRN

## 2020-03-24 MED ORDER — CHLORHEXIDINE GLUCONATE 4 % EX LIQD
4.0000 "application " | Freq: Once | CUTANEOUS | Status: DC
  Filled 2020-03-24: qty 60

## 2020-03-24 MED ORDER — DEXTROSE 5 % IV SOLN
3.0000 g | INTRAVENOUS | Status: AC
  Administered 2020-03-24: 3 g via INTRAVENOUS
  Filled 2020-03-24: qty 3

## 2020-03-24 MED ORDER — ACETAMINOPHEN 325 MG PO TABS: 325.0000 mg | ORAL_TABLET | ORAL | Status: DC | PRN

## 2020-03-24 MED ORDER — SODIUM CHLORIDE 0.9 % IV SOLN
80.0000 mg | INTRAVENOUS | Status: AC
  Administered 2020-03-24: 80 mg
  Filled 2020-03-24: qty 2

## 2020-03-24 MED ORDER — LIDOCAINE HCL (PF) 1 % IJ SOLN
INTRAMUSCULAR | Status: DC | PRN
  Administered 2020-03-24: 35 mL

## 2020-03-24 MED ORDER — HEPARIN (PORCINE) IN NACL 1000-0.9 UT/500ML-% IV SOLN
INTRAVENOUS | Status: DC | PRN
  Administered 2020-03-24: 500 mL

## 2020-03-24 MED ORDER — SODIUM CHLORIDE 0.9% FLUSH: 3.0000 mL | INTRAVENOUS | Status: DC | PRN

## 2020-03-24 MED ORDER — SODIUM CHLORIDE 0.9 % IV SOLN: INTRAVENOUS | Status: DC

## 2020-03-24 MED ORDER — LIDOCAINE HCL (PF) 1 % IJ SOLN
INTRAMUSCULAR | Status: AC
  Filled 2020-03-24: qty 90

## 2020-03-24 MED ORDER — SODIUM CHLORIDE 0.9 % IV SOLN
INTRAVENOUS | Status: AC
  Filled 2020-03-24: qty 2

## 2020-03-24 SURGICAL SUPPLY — 4 items
CABLE SURGICAL S-101-97-12 (CABLE) ×3 IMPLANT
ICD GALLANT DR CDDRA500Q (ICD Generator) ×3 IMPLANT
PAD PRO RADIOLUCENT 2001M-C (PAD) ×3 IMPLANT
TRAY PACEMAKER INSERTION (PACKS) ×3 IMPLANT

## 2020-03-24 NOTE — Discharge Instructions (Signed)
Supplemental Discharge Instructions for  °Pacemaker/Defibrillator Patients ° °Activity °No restrictions. DO wear your seatbelt, even if it crosses over the pacemaker site. ° °WOUND CARE °- Keep the wound area clean and dry.  Remove the dressing the day after you return home (usually 48 hours after the procedure). °- DO NOT SUBMERGE UNDER WATER UNTIL FULLY HEALED (no tub baths, hot tubs, swimming pools, etc.).  °- You  may shower or take a sponge bath after the dressing is removed. DO NOT SOAK the area and do not allow the shower to directly spray on the site. °- If you have staples, these will be removed in the office in 7-14 days. °- If you have tape/steri-strips on your wound, these will fall off; do not pull them off prematurely.   °- No bandage is needed on the site.  DO  NOT apply any creams, oils, or ointments to the wound area. °- If you notice any drainage or discharge from the wound, any swelling, excessive redness or bruising at the site, or if you develop a fever > 101? F after you are discharged home, call the office at once. ° °Special Instructions °- You are still able to use cellular telephones.  Avoid carrying your cellular phone near your device. °- When traveling through airports, show security personnel your identification card to avoid being screened in the metal detectors.  °- Avoid arc welding equipment, MRI testing (magnetic resonance imaging), TENS units (transcutaneous nerve stimulators).  Call the office for questions about other devices. °- Avoid electrical appliances that are in poor condition or are not properly grounded. °- Microwave ovens are safe to be near or to operate. ° °Additional information for defibrillator patients should your device go off: °- If your device goes off ONCE and you feel fine afterward, notify the clinic at [phone]. °- If your device goes off ONCE and you do not feel well afterward, call 911. °- If your device goes off TWICE or more in one day, call  911. ° °DO NOT DRIVE YOURSELF OR A FAMILY MEMBER °WITH A DEFIBRILLATOR TO THE HOSPITAL--CALL 911. ° ° °

## 2020-03-24 NOTE — Progress Notes (Signed)
Patient was given discharge instructions. He verbalized understanding. 

## 2020-03-24 NOTE — H&P (Signed)
Cardiology Admission History and Physical:   Patient ID: Danny Hinton MRN: [id number]; DOB: [birth year]   Admission date: 03/24/2020  Primary Care Provider: Dettinger, Elige Radon, MD [facility] Cardiologist: Thurmon Fair, MD  [facility] Electrophysiologist:  Will Jorja Loa, MD   Chief Complaint:  ICD at ERI  Patient Profile:   Danny Hinton is a 39 y.o. male with nonischemic cardiomyopathy, chronic systolic heart failure, defibrillator checkup, history of atrial flutter, atrial fibrillation and paroxysmal atrial tachycardia, super-obesity, here for ICD generator change.  History of Present Illness:   [name] has His St Jude Fortify dual chamber ICD implanted in 2011 (the generator is under advisory for possible unexpected battery depletion, but has shown normal behavior).  He does not require atrial pacing or ventricular pacing and has not had recent episodes of mode switch.  He has received appropriate ATP for sustained VT in the past and has had unnecessary shocks for AFlutter with RVR, but has not received shocks for VT/VF. Device reached RRT on 03/10/2020.  His September 2019 echocardiogram showed an ejection fraction of around 40%. He had successful flutter ablation, but has subsequently had symptomatic atrial tachycardia with 1:1 AV conduction.  In September 2015 he underwent cavotricuspid isthmus ablation for atrial flutter. On 08/19/2014, while [place] hunting he develop persistent rapid palpitations and felt unwell. He underwent successful overdrive pacing via his device by Dr. Hillis Range. The rhythm was atypical atrial flutter, cycle length roughly 390 ms. The episode lasted for about 3 hours until he was successfully overdrive paced. In the past he has had paroxysmal atrial fibrillation. The decision was made to ablate his flutter secondary to the occurrence of multiple unnecessary defibrillator shocks in the setting of atrial flutter with rapid ventricular rate.  His  St. Jude Fortify DR model [id number] defibrillator is under advisory for possible unexpected battery depletion. He is not device dependent and has never had shocks for true ventricular arrhythmia (although he has had inappropriate shocks for atrial flutter with rapid AV conduction). We discussed the potential for device failure in a low percentage of this particular model. There is no reason to proceed with early generator change out in his particular case.  Past Medical History:  Diagnosis Date  . Anxiety   . Atypical atrial flutter (HCC) 08/19/2014   Pace terminated in device clinic.  AFL cycle length was 390 msec.  Marland Kitchen Automatic implantable cardioverter-defibrillator in situ    a. St Jude in 2011.  . B12 nutritional deficiency, B12 = 214 (03/18/19) 04/29/2019  . Chronic anticoagulation with Coumadin   . Chronic systolic congestive heart failure (HCC)    a. suspected NICM - dx 2010. EF 15% by TEE, 10-20% by echo at that time. b. s/p St. Jude AICD 2011. c. Echo 01/2010: mod dilated LV, EF 30%, mod aortic root dilitation, no significant valvular disease.  . Depression   . Diabetes mellitus (HCC), Rx Victoza 04/29/2019  . Dyslipidemia associated with type 2 diabetes mellitus (HCC), Rx Lipitor 04/29/2019  . Eczema   . Enlarged aorta (HCC)   . Hypertension associated with diabetes (HCC)   . Mobitz type 2 second degree atrioventricular block    a. During sleeping hours in 2010 suspected due to OSA.  . Morbid obesity (HCC)   . Nonischemic cardiomyopathy (HCC)   . OSA treated with BiPAP 09/04/2018   Severe obstructive sleep apnea with an AHI of 67.2/h and nocturnal hypoxemia with oxygen saturations as low as 82%. Now on BIPAP at 18/14  cm H2O.  Marland Kitchen PAF (paroxysmal atrial fibrillation) (HCC)   . Palpitations   . Paroxysmal atrial flutter (HCC), s/p ablation   . Seasonal allergies   . Sleep apnea   . Thoracic aortic aneurysm (HCC), monitored annually 04/29/2019  . Vitamin D deficiency, vitamin D = 6.9  (03/28/19) 04/29/2019    Past Surgical History:  Procedure Laterality Date  . ATRIAL FLUTTER ABLATION N/A 01/18/2014   Procedure: ATRIAL FLUTTER ABLATION;  Surgeon: Duke Salvia, MD;  [facility] CATH LAB;  Service: Cardiovascular;  Laterality: N/A;  . CARDIAC DEFIBRILLATOR PLACEMENT  02/17/10   St. Jude Medical 45DR, model number [id number], serial number [id number]  . CARDIOVERSION N/A 12/29/2013   Procedure: CARDIOVERSION;  Surgeon: Duke Salvia, MD;  Location: [facility] OR;  Service: Cardiovascular;  Laterality: N/A;  . TOOTH EXTRACTION N/A 10/23/2012   Procedure: EXTRACTION TEETH 1, 16, 17, 30, 31;  Surgeon: [name], DDS;  Location: [facility] OR;  Service: Oral Surgery;  Laterality: N/A;     Medications Prior to Admission: Prior to Admission medications   Medication Sig Start Date End Date Taking? Authorizing Provider  atorvastatin (LIPITOR) 40 MG tablet Take 1 tablet (40 mg total) by mouth daily. 10/10/19  Yes Dettinger, Elige Radon, MD  carvedilol (COREG) 25 MG tablet Take 2 tablets (50 mg total) by mouth 2 (two) times daily with a meal. 12/17/19  Yes Amillia Biffle, MD  digoxin (LANOXIN) 0.125 MG tablet Take 1 tablet by mouth once daily Patient taking differently: Take 0.125 mg by mouth daily.  03/12/20  Yes Danuta Huseman, MD  ENTRESTO 49-51 MG Take 1 tablet by mouth twice daily Patient taking differently: Take 1 tablet by mouth 2 (two) times daily.  12/31/19  Yes Byran Bilotti, MD  fluticasone (FLONASE) 50 MCG/ACT nasal spray Place 1 spray into both nostrils as needed. Patient taking differently: Place 1 spray into both nostrils as needed for allergies.  01/04/20  Yes Dettinger, Elige Radon, MD  furosemide (LASIX) 40 MG tablet Take 40 mg by mouth daily.   Yes [provider]  loperamide (IMODIUM A-D) 2 MG tablet Take as needed Patient taking differently: Take 4 mg by mouth 4 (four) times daily as needed for diarrhea or loose stools.  06/18/19  Yes Armbruster, Willaim Rayas, MD  loratadine (EQ  LORATADINE) 10 MG tablet Take 1 tablet (10 mg total) by mouth daily. 10/10/19  Yes Dettinger, Elige Radon, MD  metoprolol tartrate (LOPRESSOR) 25 MG tablet Take 1 tablet as needed for palpitations Patient taking differently: Take 25 mg by mouth daily as needed (palpitations).  08/03/18  Yes Dettinger, Elige Radon, MD  Multiple Vitamin (MULTIVITAMIN WITH MINERALS) TABS tablet Take 1 tablet by mouth daily.   Yes [provider]  ondansetron (ZOFRAN) 4 MG tablet Take 1 tablet (4 mg total) by mouth every 8 (eight) hours as needed for nausea or vomiting. 07/10/19  Yes Helane Rima, DO  Vitamin D, Ergocalciferol, (DRISDOL) 1.25 MG (50000 UNIT) CAPS capsule Take 1 capsule (50,000 Units total) by mouth every 7 (seven) days. Patient taking differently: Take 50,000 Units by mouth 2 (two) times a week. Tues and Thurs 10/23/19  Yes Helane Rima, DO  warfarin (COUMADIN) 5 MG tablet TAKE 1 TABLET BY MOUTH ONCE DAILY EXCEPT  TAKE  1  &  1/2  TABLET  EACH  FRIDAY  OR  AS  DIRECTED  BY  ANTICOAGULATION  CLINIC Patient taking differently: Take 5-7.5 mg by mouth See admin instructions. Take 5 mg  in the evening on Sun, Tues, Thurs, and Sat. Take 7.5 mg in the evening on Mon, Wed, and Fri 08/29/19  Yes Dettinger, Elige Radon, MD  ALPRAZolam Prudy Feeler) 1 MG tablet Take 1 tablet (1 mg total) by mouth 3 (three) times daily as needed for anxiety. Patient not taking: Reported on 03/19/2020 01/13/18   Lavalle Skoda, MD  buPROPion (WELLBUTRIN XL) 150 MG 24 hr tablet Take 1 tablet (150 mg total) by mouth daily. Patient not taking: Reported on 03/19/2020 08/03/18   Dettinger, Elige Radon, MD  FLUoxetine (PROZAC) 20 MG tablet Take 1 tablet (20 mg total) by mouth daily. Patient not taking: Reported on 03/19/2020 09/11/19   Helane Rima, DO  furosemide (LASIX) 20 MG tablet Take 1 tablet (20 mg total) by mouth every other day. Patient not taking: Reported on 03/19/2020 10/10/19   Chrystie Nose, MD  Insulin Pen Needle (BD PEN NEEDLE NANO 2ND  GEN) 32G X 4 MM MISC 1 Package by Does not apply route 2 (two) times daily. 04/18/19   Helane Rima, DO  liraglutide (VICTOZA) 18 MG/3ML SOPN Inject 1.8 mg into the skin daily. 03/21/20   Dettinger, Elige Radon, MD  Probiotic Product (VSL#3) CAPS Take twice a day Patient taking differently: Take 1 capsule by mouth 2 (two) times daily.  06/18/19   Armbruster, Willaim Rayas, MD     Allergies:   No Known Allergies  Social History:   Social History   Socioeconomic History  . Marital status: Married    Spouse name: Not on file  . Number of children: 1  . Years of education: Not on file  . Highest education level: Not on file  Occupational History  . Occupation: disabled  Tobacco Use  . Smoking status: Never Smoker  . Smokeless tobacco: Never Used  Vaping Use  . Vaping Use: Never used  Substance and Sexual Activity  . Alcohol use: No  . Drug use: No  . Sexual activity: Yes  Other Topics Concern  . Not on file  Social History Narrative  . Not on file   Social Determinants of Health   Financial Resource Strain:   . Difficulty of Paying Living Expenses: Not on file  Food Insecurity:   . Worried About Programme researcher, broadcasting/film/video in the Last Year: Not on file  . Ran Out of Food in the Last Year: Not on file  Transportation Needs:   . Lack of Transportation (Medical): Not on file  . Lack of Transportation (Non-Medical): Not on file  Physical Activity:   . Days of Exercise per Week: Not on file  . Minutes of Exercise per Session: Not on file  Stress:   . Feeling of Stress : Not on file  Social Connections:   . Frequency of Communication with Friends and Family: Not on file  . Frequency of Social Gatherings with Friends and Family: Not on file  . Attends Religious Services: Not on file  . Active Member of Clubs or Organizations: Not on file  . Attends Banker Meetings: Not on file  . Marital Status: Not on file  Intimate Partner Violence:   . Fear of Current or Ex-Partner: Not  on file  . Emotionally Abused: Not on file  . Physically Abused: Not on file  . Sexually Abused: Not on file    Family History:   The patient's family history includes Anxiety disorder in his mother; Arrhythmia in his mother; Colon polyps in his father; Depression in his mother;  Diabetes in his paternal grandmother; Heart disease in his maternal grandfather and mother; Hodgkin's lymphoma (age of onset: 21) in his brother; Irritable bowel syndrome in his maternal aunt.    ROS:  Please see the history of present illness.  All other ROS reviewed and negative.     Physical Exam/Data:  There were no vitals filed for this visit. No intake or output data in the 24 hours ending 03/24/20 1348 Last 3 Weights 02/08/2020 02/05/2020 11/06/2019  Weight (lbs) 433 lb 430 lb 458 lb  Weight (kg) 196.408 kg 195.047 kg 207.747 kg     There is no height or weight on file to calculate BMI.  General:  Well nourished, well developed, in no acute distress, superobese HEENT: normal Lymph: no adenopathy Neck: no JVD Endocrine:  No thryomegaly Vascular: No carotid bruits; FA pulses 2+ bilaterally without bruits  Cardiac:  normal S1, S2; RRR; no murmur  Lungs:  clear to auscultation bilaterally, no wheezing, rhonchi or rales  Abd: soft, nontender, no hepatomegaly  Ext: no edema Musculoskeletal:  No deformities, BUE and BLE strength normal and equal Skin: warm and dry  Neuro:  CNs 2-12 intact, no focal abnormalities noted Psych:  Normal affect    EKG:  The ECG that was done 09/28 was personally reviewed and demonstrates NSR, IVCD (minor, QRS 102 ms), QTc 423 ms  Relevant CV Studies:  ECHO 01/28/2018 - Left ventricle: The cavity size was mildly dilated. There was  mild concentric hypertrophy. Systolic function was normal. The  estimated ejection fraction was 40%. Wall motion was normal;  there were no regional wall motion abnormalities. Doppler  parameters are consistent with abnormal left  ventricular  relaxation (grade 1 diastolic dysfunction). There was no evidence  of elevated ventricular filling pressure by Doppler parameters.  - Aortic valve: There was no regurgitation.  - Aortic root: The aortic root was normal in size.  - Mitral valve: There was mild regurgitation. Valve area by  pressure half-time: 2.22 cm^2.  - Left atrium: The atrium was mildly dilated.  - Right ventricle: Pacer wire or catheter noted in right ventricle.  Systolic function was normal.  - Right atrium: Pacer wire or catheter noted in right atrium.  - Tricuspid valve: There was mild regurgitation.  - Pulmonic valve: There was mild regurgitation.  - Inferior vena cava: The vessel was normal in size.  - Pericardium, extracardiac: There was no pericardial effusion.   Laboratory Data:  High Sensitivity Troponin:  No results for input(s): TROPONINIHS in the last 720 hours.    Chemistry Recent Labs  Lab 03/20/20 1418  NA 138  K 4.2  CL 102  CO2 24  GLUCOSE 80  BUN 11  CREATININE 0.94  CALCIUM 9.0  GFRNONAA 102  GFRAA 118    No results for input(s): PROT, ALBUMIN, AST, ALT, ALKPHOS, BILITOT in the last 168 hours. Hematology Recent Labs  Lab 03/20/20 1418  WBC 4.2  RBC 4.67  HGB 11.9*  HCT 37.6  MCV 81  MCH 25.5*  MCHC 31.6  RDW 14.4  PLT 292   BNPNo results for input(s): BNP, PROBNP in the last 168 hours.  DDimer No results for input(s): DDIMER in the last 168 hours.   Radiology/Studies:  No results found.   Assessment and Plan:   Plan generator changeout today. He is not device dependent.  This procedure has been fully reviewed with the patient and written informed consent has been obtained.   For questions or updates, please contact  [facility] Please consult [email or web address] for contact info under     Signed, Thurmon Fair, MD  03/24/2020 1:48 PM

## 2020-03-24 NOTE — Op Note (Signed)
Procedure report  Procedure performed:  Dual chamber ICD generator changeout   Reason for procedure:  1. Device generator at elective replacement interval  2. Chronic systolic heart failure Procedure performed by:  Thurmon Fair, MD  Complications:  None  Estimated blood loss:  <5 mL  Medications administered during procedure:  Ancef 3 g intravenously,lidocaine 1% 40 mL locally  Device details:   [employer]. Jude Gillette DR model number [id number], serial number [id number] Right atrial lead (chronic) St. Jude, model number 2088TC-52, serial [id number] (implanted 02/16/2010) Right ventricular lead (chronic)  St. jude, model number [phone], serial number [id number] (implanted 02/16/2010) Explanted generator St. Jude Fortify,  model number [id number], serial number  [id number] (implanted 02/16/2010)  Procedure details:  After the risks and benefits of the procedure were discussed the patient provided informed consent. She was brought to the cardiac catheter lab in the fasting state. The patient was prepped and draped in usual sterile fashion. Local anesthesia with 1% lidocaine was administered to to the left infraclavicular area. A 5-6cm horizontal incision was made parallel with and 2-3 cm caudal to the left clavicle, in the area of an old keloid scar.Using minimal electrocautery and mostly sharp and blunt dissection the prepectoral pocket was opened carefully to avoid injury to the loops of chronic leads. Extensive dissection was necessary due to thick keloid in the superficial layers, but the pocket and leads were easily freed. The device was explanted. The pocket was carefully inspected for hemostasis and flushed with copious amounts of antibiotic solution.  The leads were disconnected from the old generator and the new generator was connected to the chronic leads, with appropriate pacing noted. Testing via telemetry showed stable findings.  The entire system was then carefully inserted  in the pocket with care been taking that the leads and device assumed a comfortable position without pressure on the incision. Great care was taken that the leads be located deep to the generator. The pocket was then closed in layers using 2 layers of 2-0 Vicryl, one layer of 3-0 Vicryl and cutaneous steristrips after which a sterile dressing was applied.   At the end of the procedure the following lead parameters were encountered:   Right atrial lead sensed P waves 3.7 mV, impedance 460 ohms, threshold 1.25 at 1.0 ms pulse width.  Right ventricular lead sensed R waves  11.7 mV, impedance 400 ohms, threshold 0.75 at 0.5 ms pulse width.  Thurmon Fair, MD, [facility] [phone] office [phone] pager

## 2020-03-25 ENCOUNTER — Encounter (HOSPITAL_COMMUNITY): Payer: Self-pay | Admitting: Cardiovascular Disease

## 2020-03-28 ENCOUNTER — Ambulatory Visit: Payer: Medicaid Other | Admitting: Family Medicine

## 2020-04-01 ENCOUNTER — Encounter: Payer: Self-pay | Admitting: Family Medicine

## 2020-04-04 ENCOUNTER — Other Ambulatory Visit: Payer: Self-pay | Admitting: Family Medicine

## 2020-04-07 ENCOUNTER — Telehealth: Payer: Self-pay | Admitting: Emergency Medicine

## 2020-04-07 NOTE — Telephone Encounter (Signed)
rx refused as pt is aware he ntbs and he no showed his last appt and hasn't rescheduled.

## 2020-04-07 NOTE — Telephone Encounter (Signed)
Patient will come for in person appointment. Concerned about being late for 1040 DC appointment 04/08/20 due to 0900 am Coumadin appointment in [place]. Reassured that we will see hime if he is late and to arrive as close to 1040 as possible.

## 2020-04-08 ENCOUNTER — Other Ambulatory Visit: Payer: Self-pay

## 2020-04-08 ENCOUNTER — Ambulatory Visit (INDEPENDENT_AMBULATORY_CARE_PROVIDER_SITE_OTHER): Payer: Medicaid Other | Admitting: Emergency Medicine

## 2020-04-08 ENCOUNTER — Ambulatory Visit (INDEPENDENT_AMBULATORY_CARE_PROVIDER_SITE_OTHER): Payer: Medicaid Other | Admitting: *Deleted

## 2020-04-08 DIAGNOSIS — I480 Paroxysmal atrial fibrillation: Secondary | ICD-10-CM | POA: Diagnosis not present

## 2020-04-08 DIAGNOSIS — Z95810 Presence of automatic (implantable) cardiac defibrillator: Secondary | ICD-10-CM | POA: Diagnosis not present

## 2020-04-08 DIAGNOSIS — Z5181 Encounter for therapeutic drug level monitoring: Secondary | ICD-10-CM

## 2020-04-08 DIAGNOSIS — Z7901 Long term (current) use of anticoagulants: Secondary | ICD-10-CM

## 2020-04-08 DIAGNOSIS — I428 Other cardiomyopathies: Secondary | ICD-10-CM

## 2020-04-08 LAB — CUP PACEART INCLINIC DEVICE CHECK
Battery Remaining Longevity: 108 mo
Brady Statistic RA Percent Paced: 0.61 %
Brady Statistic RV Percent Paced: 0.03 %
Date Time Interrogation Session: 20211130110117
HighPow Impedance: 39.375
Implantable Lead Implant Date: 20111010
Implantable Lead Implant Date: 20111010
Implantable Lead Location: 753859
Implantable Lead Location: 753860
Implantable Pulse Generator Implant Date: 20211115
Lead Channel Impedance Value: 400 Ohm
Lead Channel Impedance Value: 450 Ohm
Lead Channel Pacing Threshold Amplitude: 0.75 V
Lead Channel Pacing Threshold Amplitude: 1.25 V
Lead Channel Pacing Threshold Pulse Width: 0.5 ms
Lead Channel Pacing Threshold Pulse Width: 1 ms
Lead Channel Sensing Intrinsic Amplitude: 12 mV
Lead Channel Sensing Intrinsic Amplitude: 3 mV
Lead Channel Setting Pacing Amplitude: 2.5 V
Lead Channel Setting Pacing Amplitude: 2.5 V
Lead Channel Setting Pacing Pulse Width: 0.5 ms
Lead Channel Setting Sensing Sensitivity: 0.5 mV
Pulse Gen Serial Number: 111031056

## 2020-04-08 LAB — POCT INR: INR: 1.9 — AB (ref 2.0–3.0)

## 2020-04-08 NOTE — Telephone Encounter (Signed)
  Prescription Request  04/08/2020  What is the name of the medication or equipment? Atorvastatin 40 mg Patient has appt 12-16 and needs enough to get him through  Have you contacted your pharmacy to request a refill? (if applicable) yes  Which pharmacy would you like this sent to? [place] [facility]   Patient notified that their request is being sent to the clinical staff for review and that they should receive a response within 2 business days.

## 2020-04-08 NOTE — Progress Notes (Signed)
Wound check appointment. Steri-strips removed. Wound without redness or edema. Incision edges approximated, wound well healed. Normal device function. Thresholds, sensing, and impedances consistent with implant measurements. Device programmed at chronic settings, no new leads implantd. Histogram distribution appropriate for patient and level of activity. No mode switches or ventricular arrhythmias noted. Patient educated about wound care, arm mobility, lifting restrictions, shock plan. ROV in 3 months with Dr. Royann Shivers. Next home remote 06/24/20.

## 2020-04-08 NOTE — Patient Instructions (Signed)
Increase warfarin to 1 1/2 tablets daily except 1 tablet on Mondays Recheck in 4 weeks Was off warfarin 4 days for Defib battery change out

## 2020-04-09 MED ORDER — ATORVASTATIN CALCIUM 40 MG PO TABS
40.0000 mg | ORAL_TABLET | Freq: Every day | ORAL | 0 refills | Status: DC
Start: 2020-04-09 — End: 2020-05-15

## 2020-04-09 NOTE — Telephone Encounter (Signed)
Pt aware refill sent to pharmacy 

## 2020-04-14 ENCOUNTER — Other Ambulatory Visit: Payer: Self-pay | Admitting: *Deleted

## 2020-04-14 ENCOUNTER — Other Ambulatory Visit: Payer: Self-pay | Admitting: Cardiovascular Disease

## 2020-04-14 DIAGNOSIS — I10 Essential (primary) hypertension: Secondary | ICD-10-CM

## 2020-04-14 DIAGNOSIS — I480 Paroxysmal atrial fibrillation: Secondary | ICD-10-CM

## 2020-04-14 MED ORDER — DIGOXIN 125 MCG PO TABS: 0.1250 mg | ORAL_TABLET | Freq: Every day | ORAL | 5 refills | Status: DC

## 2020-04-15 DIAGNOSIS — E782 Mixed hyperlipidemia: Secondary | ICD-10-CM | POA: Diagnosis not present

## 2020-04-15 DIAGNOSIS — E119 Type 2 diabetes mellitus without complications: Secondary | ICD-10-CM | POA: Diagnosis not present

## 2020-04-15 DIAGNOSIS — E559 Vitamin D deficiency, unspecified: Secondary | ICD-10-CM | POA: Diagnosis not present

## 2020-04-15 DIAGNOSIS — I4892 Unspecified atrial flutter: Secondary | ICD-10-CM | POA: Diagnosis not present

## 2020-04-15 DIAGNOSIS — G4733 Obstructive sleep apnea (adult) (pediatric): Secondary | ICD-10-CM | POA: Diagnosis not present

## 2020-04-15 DIAGNOSIS — Z6841 Body Mass Index (BMI) 40.0 and over, adult: Secondary | ICD-10-CM | POA: Diagnosis not present

## 2020-04-15 DIAGNOSIS — I509 Heart failure, unspecified: Secondary | ICD-10-CM | POA: Diagnosis not present

## 2020-04-15 DIAGNOSIS — I10 Essential (primary) hypertension: Secondary | ICD-10-CM | POA: Diagnosis not present

## 2020-04-15 DIAGNOSIS — I4891 Unspecified atrial fibrillation: Secondary | ICD-10-CM | POA: Diagnosis not present

## 2020-04-15 DIAGNOSIS — Z95810 Presence of automatic (implantable) cardiac defibrillator: Secondary | ICD-10-CM | POA: Diagnosis not present

## 2020-04-22 DIAGNOSIS — K449 Diaphragmatic hernia without obstruction or gangrene: Secondary | ICD-10-CM | POA: Diagnosis not present

## 2020-04-22 DIAGNOSIS — I10 Essential (primary) hypertension: Secondary | ICD-10-CM | POA: Diagnosis not present

## 2020-04-22 DIAGNOSIS — G4733 Obstructive sleep apnea (adult) (pediatric): Secondary | ICD-10-CM | POA: Diagnosis not present

## 2020-04-22 DIAGNOSIS — I509 Heart failure, unspecified: Secondary | ICD-10-CM | POA: Diagnosis not present

## 2020-04-22 DIAGNOSIS — I4892 Unspecified atrial flutter: Secondary | ICD-10-CM | POA: Diagnosis not present

## 2020-04-22 DIAGNOSIS — Z95810 Presence of automatic (implantable) cardiac defibrillator: Secondary | ICD-10-CM | POA: Diagnosis not present

## 2020-04-22 DIAGNOSIS — E782 Mixed hyperlipidemia: Secondary | ICD-10-CM | POA: Diagnosis not present

## 2020-04-22 DIAGNOSIS — E119 Type 2 diabetes mellitus without complications: Secondary | ICD-10-CM | POA: Diagnosis not present

## 2020-04-22 DIAGNOSIS — I4891 Unspecified atrial fibrillation: Secondary | ICD-10-CM | POA: Diagnosis not present

## 2020-04-24 ENCOUNTER — Ambulatory Visit: Payer: Medicaid Other | Admitting: Family Medicine

## 2020-04-28 ENCOUNTER — Other Ambulatory Visit: Payer: Self-pay | Admitting: Cardiovascular Disease

## 2020-04-28 DIAGNOSIS — I471 Supraventricular tachycardia: Secondary | ICD-10-CM

## 2020-05-14 ENCOUNTER — Ambulatory Visit (INDEPENDENT_AMBULATORY_CARE_PROVIDER_SITE_OTHER): Payer: Medicaid Other | Admitting: *Deleted

## 2020-05-14 DIAGNOSIS — Z5181 Encounter for therapeutic drug level monitoring: Secondary | ICD-10-CM

## 2020-05-14 DIAGNOSIS — I480 Paroxysmal atrial fibrillation: Secondary | ICD-10-CM

## 2020-05-14 LAB — POCT INR: INR: 5.2 — AB (ref 2.0–3.0)

## 2020-05-14 NOTE — Patient Instructions (Signed)
Hold warfarin tonight and tomorrow night then decrease dose to 1 1/2 tablets daily except 1 tablet on Mondays and Thursdays Recheck in 2 weeks

## 2020-05-15 ENCOUNTER — Other Ambulatory Visit: Payer: Self-pay

## 2020-05-15 ENCOUNTER — Encounter: Payer: Self-pay | Admitting: Family Medicine

## 2020-05-15 ENCOUNTER — Ambulatory Visit (INDEPENDENT_AMBULATORY_CARE_PROVIDER_SITE_OTHER): Payer: Medicaid Other | Admitting: Family Medicine

## 2020-05-15 VITALS — BP 98/59 | HR 93 | Ht 75.0 in | Wt 396.0 lb

## 2020-05-15 DIAGNOSIS — Z7901 Long term (current) use of anticoagulants: Secondary | ICD-10-CM | POA: Diagnosis not present

## 2020-05-15 DIAGNOSIS — E1159 Type 2 diabetes mellitus with other circulatory complications: Secondary | ICD-10-CM

## 2020-05-15 DIAGNOSIS — E785 Hyperlipidemia, unspecified: Secondary | ICD-10-CM

## 2020-05-15 DIAGNOSIS — I471 Supraventricular tachycardia: Secondary | ICD-10-CM

## 2020-05-15 DIAGNOSIS — E119 Type 2 diabetes mellitus without complications: Secondary | ICD-10-CM

## 2020-05-15 DIAGNOSIS — E1169 Type 2 diabetes mellitus with other specified complication: Secondary | ICD-10-CM | POA: Diagnosis not present

## 2020-05-15 DIAGNOSIS — I480 Paroxysmal atrial fibrillation: Secondary | ICD-10-CM | POA: Diagnosis not present

## 2020-05-15 DIAGNOSIS — I152 Hypertension secondary to endocrine disorders: Secondary | ICD-10-CM | POA: Diagnosis not present

## 2020-05-15 LAB — BAYER DCA HB A1C WAIVED: HB A1C (BAYER DCA - WAIVED): 6.1 % (ref <7.0)

## 2020-05-15 MED ORDER — VICTOZA 18 MG/3ML ~~LOC~~ SOPN: 1.8000 mg | PEN_INJECTOR | Freq: Every day | SUBCUTANEOUS | 3 refills | Status: DC

## 2020-05-15 MED ORDER — ATORVASTATIN CALCIUM 40 MG PO TABS: 40.0000 mg | ORAL_TABLET | Freq: Every day | ORAL | 3 refills | Status: DC

## 2020-05-15 MED ORDER — LORATADINE 10 MG PO TABS
10.0000 mg | ORAL_TABLET | Freq: Every day | ORAL | 3 refills | Status: DC
Start: 2020-05-15 — End: 2021-05-19

## 2020-05-15 MED ORDER — METOPROLOL TARTRATE 25 MG PO TABS: ORAL_TABLET | ORAL | 3 refills | Status: DC

## 2020-05-15 MED ORDER — WARFARIN SODIUM 5 MG PO TABS
ORAL_TABLET | ORAL | 3 refills | Status: DC
Start: 2020-05-15 — End: 2020-09-22

## 2020-05-15 NOTE — Progress Notes (Signed)
BP (!) 98/59   Pulse 93   Ht _0  (1.905 m)   Wt (!) 396 lb (179.6 kg)   SpO2 100%   BMI 49.50 kg/m    Subjective:   Patient ID: Danny E Tropea, male    DOB: 1980-06-21, 40 y.o.   MRN: [id number]  HPI: Danny Hinton is a 40 y.o. male presenting on 05/15/2020 for Medical Management of Chronic Issues, Atrial Fibrillation, and Diabetes   HPI Type 2 diabetes mellitus Patient comes in today for recheck of his diabetes. Patient has been currently taking victoza. Patient is not currently on an ACE inhibitor/ARB. Patient has seen an ophthalmologist this year. Patient denies any issues with their feet. The symptom started onset as an adult hypertension and hyperlipidemia and A. Fib and CHF ARE RELATED TO DM   Hypertension Patient is currently on carvedilol and digoxin and furosemide and metoprolol, and their blood pressure today is 98/59. Patient denies any lightheadedness or dizziness. Patient denies headaches, blurred vision, chest pains, shortness of breath, or weakness. Denies any side effects from medication and is content with current medication.   Hyperlipidemia Patient is coming in for recheck of his hyperlipidemia. The patient is currently taking atorvastatin. They deny any issues with myalgias or history of liver damage from it. They deny any focal numbness or weakness or chest pain.   Relevant past medical, surgical, family and social history reviewed and updated as indicated. Interim medical history since our last visit reviewed. Allergies and medications reviewed and updated.  Review of Systems  Constitutional: Negative for chills and fever.  Eyes: Negative for visual disturbance.  Respiratory: Negative for shortness of breath and wheezing.   Cardiovascular: Negative for chest pain and leg swelling.  Musculoskeletal: Negative for back pain and gait problem.  Skin: Negative for rash.  Neurological: Negative for dizziness, weakness and light-headedness.  All other systems reviewed  and are negative.   Per HPI unless specifically indicated above   Allergies as of 05/15/2020   No Known Allergies     Medication List       Accurate as of May 15, 2020  9:30 AM. If you have any questions, ask your nurse or doctor.        atorvastatin 40 MG tablet Commonly known as: LIPITOR Take 1 tablet (40 mg total) by mouth daily.   BD Pen Needle Nano 2nd Gen 32G X 4 MM Misc Generic drug: Insulin Pen Needle 1 Package by Does not apply route 2 (two) times daily.   carvedilol 25 MG tablet Commonly known as: COREG TAKE 2 TABLETS BY MOUTH TWICE DAILY WITH A MEAL   digoxin 0.125 MG tablet Commonly known as: LANOXIN Take 1 tablet (0.125 mg total) by mouth daily.   Entresto 49-51 MG Generic drug: sacubitril-valsartan Take 1 tablet by mouth twice daily   fluticasone 50 MCG/ACT nasal spray Commonly known as: FLONASE Place 1 spray into both nostrils as needed. What changed: reasons to take this   furosemide 40 MG tablet Commonly known as: LASIX Take 40 mg by mouth daily.   loperamide 2 MG tablet Commonly known as: Imodium A-D Take as needed What changed:   how much to take  how to take this  when to take this  reasons to take this  additional instructions   loratadine 10 MG tablet Commonly known as: EQ Loratadine Take 1 tablet (10 mg total) by mouth daily.   metoprolol tartrate 25 MG tablet Commonly known as: LOPRESSOR Take 1  tablet as needed for palpitations What changed:   how much to take  how to take this  when to take this  reasons to take this  additional instructions   multivitamin with minerals Tabs tablet Take 1 tablet by mouth daily.   ondansetron 4 MG tablet Commonly known as: Zofran Take 1 tablet (4 mg total) by mouth every 8 (eight) hours as needed for nausea or vomiting.   Victoza 18 MG/3ML Sopn Generic drug: liraglutide Inject 1.8 mg into the skin daily.   Vitamin D (Ergocalciferol) 1.25 MG (50000 UNIT) Caps  capsule Commonly known as: DRISDOL Take 1 capsule (50,000 Units total) by mouth every 7 (seven) days. What changed:   when to take this  additional instructions   VSL#3 Caps Take twice a day What changed:   how much to take  how to take this  when to take this  additional instructions   warfarin 5 MG tablet Commonly known as: COUMADIN Take as directed by the anticoagulation clinic. If you are unsure how to take this medication, talk to your nurse or doctor. Original instructions: TAKE 1 TABLET BY MOUTH ONCE DAILY EXCEPT  TAKE  1  &  1/2  TABLET  EACH  FRIDAY  OR  AS  DIRECTED  BY  ANTICOAGULATION  CLINIC What changed:   how much to take  how to take this  when to take this  additional instructions        Objective:   BP (!) 98/59   Pulse 93   Ht _0  (1.905 m)   Wt (!) 396 lb (179.6 kg)   SpO2 100%   BMI 49.50 kg/m   Wt Readings from Last 3 Encounters:  05/15/20 (!) 396 lb (179.6 kg)  03/24/20 (!) 449 lb (203.7 kg)  02/08/20 (!) 433 lb (196.4 kg)    Physical Exam Vitals and nursing note reviewed.  Constitutional:      General: He is not in acute distress.    Appearance: He is well-developed and well-nourished. He is not diaphoretic.  Eyes:     General: No scleral icterus.    Extraocular Movements: EOM normal.     Conjunctiva/sclera: Conjunctivae normal.  Neck:     Thyroid: No thyromegaly.  Cardiovascular:     Rate and Rhythm: Normal rate and regular rhythm.     Pulses: Intact distal pulses.     Heart sounds: Normal heart sounds. No murmur heard.   Pulmonary:     Effort: Pulmonary effort is normal. No respiratory distress.     Breath sounds: Normal breath sounds. No wheezing.  Musculoskeletal:        General: No edema. Normal range of motion.     Cervical back: Neck supple.  Lymphadenopathy:     Cervical: No cervical adenopathy.  Skin:    General: Skin is warm and dry.     Findings: No rash.  Neurological:     Mental Status: He is  alert and oriented to person, place, and time.     Coordination: Coordination normal.  Psychiatric:        Mood and Affect: Mood and affect normal.        Behavior: Behavior normal.       Assessment & Plan:   Problem List Items Addressed This Visit      Cardiovascular and Mediastinum   Hypertension associated with diabetes ([place]) - Primary   Relevant Medications   liraglutide (VICTOZA) 18 MG/3ML SOPN   metoprolol tartrate (LOPRESSOR) 25  MG tablet   warfarin (COUMADIN) 5 MG tablet   atorvastatin (LIPITOR) 40 MG tablet   Paroxysmal atrial fibrillation (HCC)   Relevant Medications   metoprolol tartrate (LOPRESSOR) 25 MG tablet   warfarin (COUMADIN) 5 MG tablet   atorvastatin (LIPITOR) 40 MG tablet   Other Relevant Orders   CMP14+EGFR     Endocrine   Diabetes mellitus ([place]), Rx Victoza   Relevant Medications   liraglutide (VICTOZA) 18 MG/3ML SOPN   atorvastatin (LIPITOR) 40 MG tablet   Other Relevant Orders   Bayer DCA Hb A1c Waived   Lipid panel   Dyslipidemia associated with type 2 diabetes mellitus ([place]), Rx Lipitor   Relevant Medications   liraglutide (VICTOZA) 18 MG/3ML SOPN   atorvastatin (LIPITOR) 40 MG tablet     Other   Long term current use of anticoagulant therapy, Coumadin   Relevant Medications   warfarin (COUMADIN) 5 MG tablet    Other Visit Diagnoses    Atrial tachycardia (HCC)       Relevant Medications   metoprolol tartrate (LOPRESSOR) 25 MG tablet   warfarin (COUMADIN) 5 MG tablet   atorvastatin (LIPITOR) 40 MG tablet   Other Relevant Orders   CBC with Differential/Platelet      Will check blood work, no change in medicine, the cardiologist is managing his INR and Coumadin that was off yesterday.  Patient has a pacer, blood pressure on the lower side but cardiology is monitoring it closely. Follow up plan: Return in about 3 months (around 08/13/2020), or if symptoms worsen or fail to improve, for Diabetes and hypertension and  cholesterol..  Counseling provided for all of the vaccine components No orders of the defined types were placed in this encounter.   Caryl Pina, MD [facility] 05/15/2020, 9:30 AM

## 2020-05-16 LAB — CBC WITH DIFFERENTIAL/PLATELET
Basophils Absolute: 0 10*3/uL (ref 0.0–0.2)
Basos: 1 %
EOS (ABSOLUTE): 0.2 10*3/uL (ref 0.0–0.4)
Eos: 5 %
Hematocrit: 37.9 % (ref 37.5–51.0)
Hemoglobin: 12.1 g/dL — ABNORMAL LOW (ref 13.0–17.7)
Immature Grans (Abs): 0 10*3/uL (ref 0.0–0.1)
Immature Granulocytes: 0 %
Lymphocytes Absolute: 1.6 10*3/uL (ref 0.7–3.1)
Lymphs: 34 %
MCH: 25.5 pg — ABNORMAL LOW (ref 26.6–33.0)
MCHC: 31.9 g/dL (ref 31.5–35.7)
MCV: 80 fL (ref 79–97)
Monocytes Absolute: 0.5 10*3/uL (ref 0.1–0.9)
Monocytes: 10 %
Neutrophils Absolute: 2.3 10*3/uL (ref 1.4–7.0)
Neutrophils: 50 %
Platelets: 285 10*3/uL (ref 150–450)
RBC: 4.74 x10E6/uL (ref 4.14–5.80)
RDW: 14.7 % (ref 11.6–15.4)
WBC: 4.6 10*3/uL (ref 3.4–10.8)

## 2020-05-16 LAB — CMP14+EGFR
ALT: 20 IU/L (ref 0–44)
AST: 13 IU/L (ref 0–40)
Albumin/Globulin Ratio: 1.4 (ref 1.2–2.2)
Albumin: 4.4 g/dL (ref 4.0–5.0)
Alkaline Phosphatase: 79 IU/L (ref 44–121)
BUN/Creatinine Ratio: 15 (ref 9–20)
BUN: 12 mg/dL (ref 6–20)
Bilirubin Total: 0.8 mg/dL (ref 0.0–1.2)
CO2: 21 mmol/L (ref 20–29)
Calcium: 9.4 mg/dL (ref 8.7–10.2)
Chloride: 103 mmol/L (ref 96–106)
Creatinine, Ser: 0.8 mg/dL (ref 0.76–1.27)
GFR calc Af Amer: 131 mL/min/{1.73_m2} (ref >59)
GFR calc non Af Amer: 113 mL/min/{1.73_m2} (ref >59)
Globulin, Total: 3.1 g/dL (ref 1.5–4.5)
Glucose: 99 mg/dL (ref 65–99)
Potassium: 4.2 mmol/L (ref 3.5–5.2)
Sodium: 140 mmol/L (ref 134–144)
Total Protein: 7.5 g/dL (ref 6.0–8.5)

## 2020-05-16 LAB — LIPID PANEL
Chol/HDL Ratio: 3.2 ratio (ref 0.0–5.0)
Cholesterol, Total: 96 mg/dL — ABNORMAL LOW (ref 100–199)
HDL: 30 mg/dL — ABNORMAL LOW (ref >39)
LDL Chol Calc (NIH): 49 mg/dL (ref 0–99)
Triglycerides: 82 mg/dL (ref 0–149)
VLDL Cholesterol Cal: 17 mg/dL (ref 5–40)

## 2020-05-28 ENCOUNTER — Other Ambulatory Visit: Payer: Self-pay | Admitting: Cardiovascular Disease

## 2020-05-28 ENCOUNTER — Ambulatory Visit (INDEPENDENT_AMBULATORY_CARE_PROVIDER_SITE_OTHER): Payer: Medicaid Other | Admitting: *Deleted

## 2020-05-28 DIAGNOSIS — I480 Paroxysmal atrial fibrillation: Secondary | ICD-10-CM

## 2020-05-28 DIAGNOSIS — Z5181 Encounter for therapeutic drug level monitoring: Secondary | ICD-10-CM

## 2020-05-28 DIAGNOSIS — I471 Supraventricular tachycardia: Secondary | ICD-10-CM

## 2020-05-28 LAB — POCT INR: INR: 3.4 — AB (ref 2.0–3.0)

## 2020-05-28 NOTE — Patient Instructions (Signed)
Hold warfarin tonight then decrease dose to 1 1/2 tablets daily except 1 tablet on Mondays, Wednesdays and Fridays ?Recheck in 3 weeks  ?

## 2020-06-09 ENCOUNTER — Other Ambulatory Visit: Payer: Self-pay | Admitting: Family Medicine

## 2020-06-11 DIAGNOSIS — Z029 Encounter for administrative examinations, unspecified: Secondary | ICD-10-CM

## 2020-06-18 ENCOUNTER — Ambulatory Visit (INDEPENDENT_AMBULATORY_CARE_PROVIDER_SITE_OTHER): Payer: Medicaid Other | Admitting: *Deleted

## 2020-06-18 DIAGNOSIS — Z5181 Encounter for therapeutic drug level monitoring: Secondary | ICD-10-CM

## 2020-06-18 DIAGNOSIS — I480 Paroxysmal atrial fibrillation: Secondary | ICD-10-CM | POA: Diagnosis not present

## 2020-06-18 LAB — POCT INR: INR: 3.5 — AB (ref 2.0–3.0)

## 2020-06-18 NOTE — Patient Instructions (Signed)
Hold warfarin tonight then decrease dose to 1 tablet daily except 1 1/2 tablets on Tuesdays and Saturdays Recheck in 3 weeks.  

## 2020-06-24 ENCOUNTER — Ambulatory Visit (INDEPENDENT_AMBULATORY_CARE_PROVIDER_SITE_OTHER): Payer: Medicaid Other

## 2020-06-24 DIAGNOSIS — I428 Other cardiomyopathies: Secondary | ICD-10-CM

## 2020-06-24 LAB — CUP PACEART REMOTE DEVICE CHECK
Battery Remaining Longevity: 95 mo
Battery Remaining Percentage: 95 %
Battery Voltage: 3.07 V
Brady Statistic AP VP Percent: 1 %
Brady Statistic AP VS Percent: 1 %
Brady Statistic AS VP Percent: 1 %
Brady Statistic AS VS Percent: 99 %
Brady Statistic RA Percent Paced: 1 %
Brady Statistic RV Percent Paced: 1 %
Date Time Interrogation Session: 20220215020042
HighPow Impedance: 39 Ohm
Implantable Lead Implant Date: 20111010
Implantable Lead Implant Date: 20111010
Implantable Lead Location: 753859
Implantable Lead Location: 753860
Implantable Pulse Generator Implant Date: 20211115
Lead Channel Impedance Value: 390 Ohm
Lead Channel Impedance Value: 440 Ohm
Lead Channel Pacing Threshold Amplitude: 0.75 V
Lead Channel Pacing Threshold Amplitude: 1.25 V
Lead Channel Pacing Threshold Pulse Width: 0.5 ms
Lead Channel Pacing Threshold Pulse Width: 1 ms
Lead Channel Sensing Intrinsic Amplitude: 11.7 mV
Lead Channel Sensing Intrinsic Amplitude: 3.2 mV
Lead Channel Setting Pacing Amplitude: 2.5 V
Lead Channel Setting Pacing Amplitude: 2.5 V
Lead Channel Setting Pacing Pulse Width: 0.5 ms
Lead Channel Setting Sensing Sensitivity: 0.5 mV
Pulse Gen Serial Number: 111031056

## 2020-07-01 DIAGNOSIS — I471 Supraventricular tachycardia: Secondary | ICD-10-CM

## 2020-07-01 MED ORDER — CARVEDILOL 25 MG PO TABS: ORAL_TABLET | ORAL | 11 refills | Status: DC

## 2020-07-01 NOTE — Progress Notes (Signed)
Remote ICD transmission.   

## 2020-07-07 ENCOUNTER — Encounter: Payer: Medicaid Other | Admitting: Cardiovascular Disease

## 2020-07-09 ENCOUNTER — Ambulatory Visit (INDEPENDENT_AMBULATORY_CARE_PROVIDER_SITE_OTHER): Payer: Medicaid Other | Admitting: *Deleted

## 2020-07-09 DIAGNOSIS — I480 Paroxysmal atrial fibrillation: Secondary | ICD-10-CM

## 2020-07-09 DIAGNOSIS — Z5181 Encounter for therapeutic drug level monitoring: Secondary | ICD-10-CM | POA: Diagnosis not present

## 2020-07-09 LAB — POCT INR: INR: 2.4 (ref 2.0–3.0)

## 2020-07-09 NOTE — Patient Instructions (Signed)
Continue warfarin 1 tablet daily except 1 1/2 tablets on Tuesdays and Saturdays Pending gastric bypass on 3/21 at [facility].  Will stop warfarin 7 days before procedure and bridge with Lovenox per the bariatric center.  They are managing Pt to call back for appt after he see's surgeon.  Pt states he will be on Lovenox about 2 wks after surgery before adding back warfarin.

## 2020-07-15 ENCOUNTER — Encounter: Payer: Self-pay | Admitting: Family Medicine

## 2020-07-16 ENCOUNTER — Encounter: Payer: Self-pay | Admitting: Family Medicine

## 2020-07-16 ENCOUNTER — Telehealth (INDEPENDENT_AMBULATORY_CARE_PROVIDER_SITE_OTHER): Payer: Medicaid Other | Admitting: Family Medicine

## 2020-07-16 DIAGNOSIS — J0100 Acute maxillary sinusitis, unspecified: Secondary | ICD-10-CM

## 2020-07-16 MED ORDER — AMOXICILLIN-POT CLAVULANATE 875-125 MG PO TABS: 1.0000 | ORAL_TABLET | Freq: Two times a day (BID) | ORAL | 0 refills | Status: AC

## 2020-07-16 NOTE — Progress Notes (Signed)
Virtual Visit via Telephone Note  I connected with Danny Hinton on 07/16/20 at 8:24 AM by telephone and verified that I am speaking with the correct person using two identifiers. Danny Hinton is currently located at home and nobody is currently with him during this visit. The provider, Gwenlyn Fudge, FNP is located in their office at time of visit.  I discussed the limitations, risks, security and privacy concerns of performing an evaluation and management service by telephone and the availability of in person appointments. I also discussed with the patient that there may be a patient responsible charge related to this service. The patient expressed understanding and agreed to proceed.  Subjective: PCP: Dettinger, Elige Radon, MD  Chief Complaint  Patient presents with  . URI   Patient complains of cough, head congestion, runny nose, facial pain/pressure and postnasal drainage.  Onset of symptoms was 3 days ago, gradually improving since that time. He is drinking plenty of fluids. Evaluation to date: at home COVID-19 test was negative. Treatment to date: antihistamines, cough suppressants and nasal steroids. He has a history of recurrent sinus infections. He does not smoke. Patient is up-to-date with COVID-19 vaccines.    ROS: Per HPI  Current Outpatient Medications:  .  atorvastatin (LIPITOR) 40 MG tablet, Take 1 tablet (40 mg total) by mouth daily., Disp: 90 tablet, Rfl: 3 .  carvedilol (COREG) 25 MG tablet, TAKE 2 TABLETS BY MOUTH TWICE DAILY WITH A MEAL, Disp: 120 tablet, Rfl: 11 .  digoxin (LANOXIN) 0.125 MG tablet, Take 1 tablet (0.125 mg total) by mouth daily., Disp: 30 tablet, Rfl: 5 .  ENTRESTO 49-51 MG, Take 1 tablet by mouth twice daily (Patient taking differently: Take 1 tablet by mouth 2 (two) times daily.), Disp: 180 tablet, Rfl: 2 .  fluticasone (FLONASE) 50 MCG/ACT nasal spray, Place 1 spray into both nostrils as needed. (Patient taking differently: Place 1 spray into both  nostrils as needed for allergies.), Disp: 16 g, Rfl: 11 .  furosemide (LASIX) 40 MG tablet, Take 1 tablet by mouth once daily, Disp: 90 tablet, Rfl: 0 .  Insulin Pen Needle (BD PEN NEEDLE NANO 2ND GEN) 32G X 4 MM MISC, 1 Package by Does not apply route 2 (two) times daily., Disp: 100 each, Rfl: 0 .  liraglutide (VICTOZA) 18 MG/3ML SOPN, Inject 1.8 mg into the skin daily., Disp: 9 mL, Rfl: 3 .  loperamide (IMODIUM A-D) 2 MG tablet, Take as needed (Patient taking differently: Take 4 mg by mouth 4 (four) times daily as needed for diarrhea or loose stools.), Disp: 30 tablet, Rfl: 0 .  loratadine (EQ LORATADINE) 10 MG tablet, Take 1 tablet (10 mg total) by mouth daily., Disp: 90 tablet, Rfl: 3 .  metoprolol tartrate (LOPRESSOR) 25 MG tablet, Take 1 tablet as needed for palpitations, Disp: 90 tablet, Rfl: 3 .  Multiple Vitamin (MULTIVITAMIN WITH MINERALS) TABS tablet, Take 1 tablet by mouth daily., Disp: , Rfl:  .  ondansetron (ZOFRAN) 4 MG tablet, Take 1 tablet (4 mg total) by mouth every 8 (eight) hours as needed for nausea or vomiting., Disp: 20 tablet, Rfl: 0 .  Probiotic Product (VSL#3) CAPS, Take twice a day (Patient taking differently: Take 1 capsule by mouth 2 (two) times daily.), Disp:  , Rfl:  .  Vitamin D, Ergocalciferol, (DRISDOL) 1.25 MG (50000 UNIT) CAPS capsule, Take 1 capsule (50,000 Units total) by mouth every 7 (seven) days. (Patient taking differently: Take 50,000 Units by mouth 2 (two)  times a week. Tues and Thurs), Disp: 12 capsule, Rfl: 0 .  warfarin (COUMADIN) 5 MG tablet, TAKE 1 TABLET BY MOUTH ONCE DAILY EXCEPT  TAKE  1  &  1/2  TABLET  EACH  FRIDAY  OR  AS  DIRECTED  BY  ANTICOAGULATION  CLINIC, Disp: 100 tablet, Rfl: 3  No Known Allergies Past Medical History:  Diagnosis Date  . Anxiety   . Atypical atrial flutter (HCC) 08/19/2014   Pace terminated in device clinic.  AFL cycle length was 390 msec.  Marland Kitchen Automatic implantable cardioverter-defibrillator in situ    a. St Jude in  2011.  . B12 nutritional deficiency, B12 = 214 (03/18/19) 04/29/2019  . Chronic anticoagulation with Coumadin   . Chronic systolic congestive heart failure (HCC)    a. suspected NICM - dx 2010. EF 15% by TEE, 10-20% by echo at that time. b. s/p St. Jude AICD 2011. c. Echo 01/2010: mod dilated LV, EF 30%, mod aortic root dilitation, no significant valvular disease.  . Depression   . Diabetes mellitus (HCC), Rx Victoza 04/29/2019  . Dyslipidemia associated with type 2 diabetes mellitus (HCC), Rx Lipitor 04/29/2019  . Eczema   . Enlarged aorta (HCC)   . Hypertension associated with diabetes (HCC)   . Mobitz type 2 second degree atrioventricular block    a. During sleeping hours in 2010 suspected due to OSA.  . Morbid obesity (HCC)   . Nonischemic cardiomyopathy (HCC)   . OSA treated with BiPAP 09/04/2018   Severe obstructive sleep apnea with an AHI of 67.2/h and nocturnal hypoxemia with oxygen saturations as low as 82%. Now on BIPAP at 18/14 cm H2O.  Marland Kitchen PAF (paroxysmal atrial fibrillation) (HCC)   . Palpitations   . Paroxysmal atrial flutter (HCC), s/p ablation   . Seasonal allergies   . Sleep apnea   . Thoracic aortic aneurysm (HCC), monitored annually 04/29/2019  . Vitamin D deficiency, vitamin D = 6.9 (03/28/19) 04/29/2019    Observations/Objective: A&O  No respiratory distress or wheezing audible over the phone Mood, judgement, and thought processes all WNL  Assessment and Plan: 1. Acute non-recurrent maxillary sinusitis Discussed symptom management. Recommended Mucinex BID. Patient declines testing for COVID-19.  - amoxicillin-clavulanate (AUGMENTIN) 875-125 MG tablet; Take 1 tablet by mouth 2 (two) times daily for 7 days.  Dispense: 14 tablet; Refill: 0   Follow Up Instructions:  I discussed the assessment and treatment plan with the patient. The patient was provided an opportunity to ask questions and all were answered. The patient agreed with the plan and demonstrated an  understanding of the instructions.   The patient was advised to call back or seek an in-person evaluation if the symptoms worsen or if the condition fails to improve as anticipated.  The above assessment and management plan was discussed with the patient. The patient verbalized understanding of and has agreed to the management plan. Patient is aware to call the clinic if symptoms persist or worsen. Patient is aware when to return to the clinic for a follow-up visit. Patient educated on when it is appropriate to go to the emergency department.   Time call ended: 8:35 AM  I provided 11 minutes of non-face-to-face time during this encounter.  [name], MSN, APRN, FNP-C [facility] 07/16/20

## 2020-07-22 DIAGNOSIS — I4892 Unspecified atrial flutter: Secondary | ICD-10-CM | POA: Diagnosis not present

## 2020-07-22 DIAGNOSIS — E119 Type 2 diabetes mellitus without complications: Secondary | ICD-10-CM | POA: Diagnosis not present

## 2020-07-22 DIAGNOSIS — G4733 Obstructive sleep apnea (adult) (pediatric): Secondary | ICD-10-CM | POA: Diagnosis not present

## 2020-07-22 DIAGNOSIS — I4891 Unspecified atrial fibrillation: Secondary | ICD-10-CM | POA: Diagnosis not present

## 2020-07-22 DIAGNOSIS — E782 Mixed hyperlipidemia: Secondary | ICD-10-CM | POA: Diagnosis not present

## 2020-07-22 DIAGNOSIS — I10 Essential (primary) hypertension: Secondary | ICD-10-CM | POA: Diagnosis not present

## 2020-07-22 DIAGNOSIS — K449 Diaphragmatic hernia without obstruction or gangrene: Secondary | ICD-10-CM | POA: Diagnosis not present

## 2020-07-22 DIAGNOSIS — Z95810 Presence of automatic (implantable) cardiac defibrillator: Secondary | ICD-10-CM | POA: Diagnosis not present

## 2020-07-22 DIAGNOSIS — Z01818 Encounter for other preprocedural examination: Secondary | ICD-10-CM | POA: Diagnosis not present

## 2020-07-22 DIAGNOSIS — I509 Heart failure, unspecified: Secondary | ICD-10-CM | POA: Diagnosis not present

## 2020-07-25 ENCOUNTER — Telehealth: Payer: Self-pay | Admitting: *Deleted

## 2020-07-25 NOTE — Telephone Encounter (Signed)
   [facility] Pre-operative Risk Assessment   Request for surgical clearance:  1. What type of surgery is being performed? LAPAROSCOPIC ROUX - EN - Y  2. When is this surgery scheduled? 07/28/20   3. What type of clearance is required (medical clearance vs. Pharmacy clearance to hold med vs. Both)? BOTH ( [place])  4. Are there any medications that need to be held prior to surgery and how long? WARFARIN     5. Practice name and name of physician performing surgery? [facility]  6. What is the office phone number? [phone]   7.   What is the office fax number? [phone]  8.   Anesthesia type (None, local, MAC, general) ? CHOICE   Devra Dopp 07/25/2020, 8:30 AM  _________________________________________________________________   (provider comments below)

## 2020-07-25 NOTE — Telephone Encounter (Signed)
   Primary Cardiologist: Thurmon Fair, MD  Chart reviewed as part of pre-operative protocol coverage. Patient was contacted 07/25/2020 in reference to pre-operative risk assessment for pending surgery as outlined below.  [name] E Thoma was last seen on 02/08/20 by Dr. Royann Shivers.  Since that day, [name] E Schwieger has done fine from a cardiac standpoint. He has baseline DOE which is unchanged in the past several months. Discussed with Dr. Royann Shivers who states patient is low to moderate risk for the planned procedure. No further cardiovascular testing is required.   The patient was advised that if he develops new symptoms prior to surgery to contact our office to arrange for a follow-up visit, and he verbalized understanding.  Per Dr. Royann Shivers, a magnet should be placed over his ICD during electrocautery use.   Additionally, patient is cleared to hold coumadin 5-7 days prior to his upcoming surgery with plans to restart as soon as he is cleared to do so by his surgeon. Lovenox bridging is not required. Patient reports he stopped his coumadin 07/20/20 in anticipation of surgery.  I will route this recommendation to the requesting party via Epic fax function and remove from pre-op pool. Please call with questions.  Beatriz Stallion, PA-C 07/25/2020, 11:12 AM

## 2020-07-28 DIAGNOSIS — I509 Heart failure, unspecified: Secondary | ICD-10-CM | POA: Diagnosis not present

## 2020-07-28 DIAGNOSIS — K219 Gastro-esophageal reflux disease without esophagitis: Secondary | ICD-10-CM | POA: Diagnosis not present

## 2020-07-28 DIAGNOSIS — Z6841 Body Mass Index (BMI) 40.0 and over, adult: Secondary | ICD-10-CM | POA: Diagnosis not present

## 2020-07-28 DIAGNOSIS — I10 Essential (primary) hypertension: Secondary | ICD-10-CM | POA: Diagnosis not present

## 2020-07-28 DIAGNOSIS — F419 Anxiety disorder, unspecified: Secondary | ICD-10-CM | POA: Diagnosis not present

## 2020-07-28 DIAGNOSIS — E119 Type 2 diabetes mellitus without complications: Secondary | ICD-10-CM | POA: Diagnosis not present

## 2020-07-28 DIAGNOSIS — F32A Depression, unspecified: Secondary | ICD-10-CM | POA: Diagnosis not present

## 2020-07-28 DIAGNOSIS — E785 Hyperlipidemia, unspecified: Secondary | ICD-10-CM | POA: Diagnosis not present

## 2020-07-28 DIAGNOSIS — G4733 Obstructive sleep apnea (adult) (pediatric): Secondary | ICD-10-CM | POA: Diagnosis not present

## 2020-07-28 DIAGNOSIS — Z95810 Presence of automatic (implantable) cardiac defibrillator: Secondary | ICD-10-CM | POA: Diagnosis not present

## 2020-07-28 DIAGNOSIS — I4891 Unspecified atrial fibrillation: Secondary | ICD-10-CM | POA: Diagnosis not present

## 2020-07-28 DIAGNOSIS — Z79899 Other long term (current) drug therapy: Secondary | ICD-10-CM | POA: Diagnosis not present

## 2020-07-28 DIAGNOSIS — E782 Mixed hyperlipidemia: Secondary | ICD-10-CM | POA: Diagnosis not present

## 2020-07-28 DIAGNOSIS — K449 Diaphragmatic hernia without obstruction or gangrene: Secondary | ICD-10-CM | POA: Diagnosis not present

## 2020-07-28 DIAGNOSIS — Z950 Presence of cardiac pacemaker: Secondary | ICD-10-CM | POA: Diagnosis not present

## 2020-07-28 DIAGNOSIS — I110 Hypertensive heart disease with heart failure: Secondary | ICD-10-CM | POA: Diagnosis not present

## 2020-07-29 ENCOUNTER — Encounter: Payer: Medicaid Other | Admitting: Cardiology

## 2020-07-29 DIAGNOSIS — K9189 Other postprocedural complications and disorders of digestive system: Secondary | ICD-10-CM | POA: Diagnosis not present

## 2020-07-30 DIAGNOSIS — Z9889 Other specified postprocedural states: Secondary | ICD-10-CM | POA: Diagnosis not present

## 2020-07-31 ENCOUNTER — Telehealth: Payer: Self-pay | Admitting: Emergency Medicine

## 2020-07-31 NOTE — Telephone Encounter (Signed)
Thank you :)

## 2020-07-31 NOTE — Telephone Encounter (Signed)
Remote transmission received 07/28/20. Transmission reviewed and shows device programmed DDD with lower rate of 50 bpm, device function WNL, no episodes recorded since 06/14/20.

## 2020-07-31 NOTE — Telephone Encounter (Signed)
See MyChart message

## 2020-08-05 ENCOUNTER — Ambulatory Visit (INDEPENDENT_AMBULATORY_CARE_PROVIDER_SITE_OTHER): Payer: Medicaid Other | Admitting: *Deleted

## 2020-08-05 DIAGNOSIS — I480 Paroxysmal atrial fibrillation: Secondary | ICD-10-CM

## 2020-08-05 DIAGNOSIS — Z5181 Encounter for therapeutic drug level monitoring: Secondary | ICD-10-CM

## 2020-08-05 LAB — POCT INR: INR: 1.4 — AB (ref 2.0–3.0)

## 2020-08-05 NOTE — Patient Instructions (Signed)
S/P Gastric Bypass Currently on Lovenox 40mg  daily per Bariatric Center Has not restarted warfarin. Take warfarin 1 1/2 tablets tonight and tomorrow night then resume 1 tablet daily except 1 1/2 tablets on Tuesdays and Saturdays Continue Lovenox Recheck on Monday

## 2020-08-08 ENCOUNTER — Ambulatory Visit (INDEPENDENT_AMBULATORY_CARE_PROVIDER_SITE_OTHER): Payer: Medicaid Other | Admitting: Cardiovascular Disease

## 2020-08-08 ENCOUNTER — Other Ambulatory Visit: Payer: Self-pay

## 2020-08-08 ENCOUNTER — Encounter: Payer: Self-pay | Admitting: Cardiovascular Disease

## 2020-08-08 VITALS — BP 100/60 | HR 55 | Ht 75.0 in | Wt 359.0 lb

## 2020-08-08 DIAGNOSIS — Z95810 Presence of automatic (implantable) cardiac defibrillator: Secondary | ICD-10-CM | POA: Diagnosis not present

## 2020-08-08 DIAGNOSIS — I428 Other cardiomyopathies: Secondary | ICD-10-CM | POA: Diagnosis not present

## 2020-08-08 DIAGNOSIS — I5042 Chronic combined systolic (congestive) and diastolic (congestive) heart failure: Secondary | ICD-10-CM

## 2020-08-08 DIAGNOSIS — E669 Obesity, unspecified: Secondary | ICD-10-CM | POA: Diagnosis not present

## 2020-08-08 DIAGNOSIS — I10 Essential (primary) hypertension: Secondary | ICD-10-CM | POA: Diagnosis not present

## 2020-08-08 DIAGNOSIS — E785 Hyperlipidemia, unspecified: Secondary | ICD-10-CM

## 2020-08-08 DIAGNOSIS — I480 Paroxysmal atrial fibrillation: Secondary | ICD-10-CM

## 2020-08-08 DIAGNOSIS — Z7901 Long term (current) use of anticoagulants: Secondary | ICD-10-CM

## 2020-08-08 DIAGNOSIS — E1169 Type 2 diabetes mellitus with other specified complication: Secondary | ICD-10-CM | POA: Diagnosis not present

## 2020-08-08 NOTE — Patient Instructions (Signed)
Medication Instructions:  No changes *If you need a refill on your cardiac medications before your next appointment, please call your pharmacy*   Lab Work: None ordered If you have labs (blood work) drawn today and your tests are completely normal, you will receive your results only by: MyChart Message (if you have MyChart) OR A paper copy in the mail If you have any lab test that is abnormal or we need to change your treatment, we will call you to review the results.   Testing/Procedures: None ordered   Follow-Up: At [facility], you and your health needs are our priority.  As part of our continuing mission to provide you with exceptional heart care, we have created designated Provider Care Teams.  These Care Teams include your primary Cardiologist (physician) and Advanced Practice Providers (APPs -  Physician Assistants and Nurse Practitioners) who all work together to provide you with the care you need, when you need it.  We recommend signing up for the patient portal called "MyChart".  Sign up information is provided on this After Visit Summary.  MyChart is used to connect with patients for Virtual Visits (Telemedicine).  Patients are able to view lab/test results, encounter notes, upcoming appointments, etc.  Non-urgent messages can be sent to your provider as well.   To learn more about what you can do with MyChart, go to [email or web address].    Your next appointment:   3 month(s) on a device day  The format for your next appointment:   In Person  Provider:   Mihai Croitoru, MD   

## 2020-08-11 ENCOUNTER — Ambulatory Visit (INDEPENDENT_AMBULATORY_CARE_PROVIDER_SITE_OTHER): Payer: Medicaid Other | Admitting: *Deleted

## 2020-08-11 ENCOUNTER — Other Ambulatory Visit: Payer: Self-pay

## 2020-08-11 DIAGNOSIS — I480 Paroxysmal atrial fibrillation: Secondary | ICD-10-CM | POA: Diagnosis not present

## 2020-08-11 DIAGNOSIS — E119 Type 2 diabetes mellitus without complications: Secondary | ICD-10-CM | POA: Diagnosis not present

## 2020-08-11 DIAGNOSIS — Z713 Dietary counseling and surveillance: Secondary | ICD-10-CM | POA: Diagnosis not present

## 2020-08-11 DIAGNOSIS — Z5181 Encounter for therapeutic drug level monitoring: Secondary | ICD-10-CM

## 2020-08-11 DIAGNOSIS — G4733 Obstructive sleep apnea (adult) (pediatric): Secondary | ICD-10-CM | POA: Diagnosis not present

## 2020-08-11 LAB — POCT INR: INR: 4.5 — AB (ref 2.0–3.0)

## 2020-08-11 NOTE — Patient Instructions (Signed)
S/P Gastric Bypass  Wt loss 27 lbs Hold warfarin tonight then decrease dose to 1 tablet daily except 1/2 tablets on Tuesdays  Recheck on Monday

## 2020-08-13 ENCOUNTER — Encounter: Payer: Self-pay | Admitting: Family Medicine

## 2020-08-13 ENCOUNTER — Other Ambulatory Visit: Payer: Self-pay

## 2020-08-13 ENCOUNTER — Ambulatory Visit (INDEPENDENT_AMBULATORY_CARE_PROVIDER_SITE_OTHER): Payer: Medicaid Other | Admitting: Family Medicine

## 2020-08-13 VITALS — BP 103/52 | HR 65 | Ht 75.0 in | Wt 360.0 lb

## 2020-08-13 DIAGNOSIS — I152 Hypertension secondary to endocrine disorders: Secondary | ICD-10-CM | POA: Diagnosis not present

## 2020-08-13 DIAGNOSIS — E785 Hyperlipidemia, unspecified: Secondary | ICD-10-CM | POA: Diagnosis not present

## 2020-08-13 DIAGNOSIS — E1169 Type 2 diabetes mellitus with other specified complication: Secondary | ICD-10-CM | POA: Diagnosis not present

## 2020-08-13 DIAGNOSIS — E1159 Type 2 diabetes mellitus with other circulatory complications: Secondary | ICD-10-CM

## 2020-08-13 DIAGNOSIS — I480 Paroxysmal atrial fibrillation: Secondary | ICD-10-CM | POA: Diagnosis not present

## 2020-08-13 DIAGNOSIS — I5022 Chronic systolic (congestive) heart failure: Secondary | ICD-10-CM

## 2020-08-13 MED ORDER — FUROSEMIDE 40 MG PO TABS: 40.0000 mg | ORAL_TABLET | Freq: Every day | ORAL | 3 refills | Status: DC

## 2020-08-13 NOTE — Progress Notes (Signed)
BP (!) 103/52   Pulse 65   Ht _0  (1.905 m)   Wt (!) 360 lb (163.3 kg)   SpO2 99%   BMI 45.00 kg/m    Subjective:   Patient ID: Danny Hinton, male    DOB: [birth year], 40 y.o.   MRN: [id number]  HPI: Danny Hinton is a 40 y.o. male presenting on 08/13/2020 for Medical Management of Chronic Issues, Hypertension, Diabetes, and Atrial Fibrillation   HPI Type 2 diabetes mellitus Patient comes in today for recheck of his diabetes. Patient has been currently taking no medication currently, came off Victoza because of his gastric bypass. Patient is currently on an ACE inhibitor/ARB. Patient has not seen an ophthalmologist this year. Patient denies any issues with their feet. The symptom started onset as an adult CHF and A. fib and dyslipidemia ARE RELATED TO DM   A. Fib Patient has A. fib and is managed by cardiology currently.  He is seeing them for his Coumadin levels and has an appointment with them next week.  Hypertension Patient is currently on Entresto and carvedilol and furosemide and metoprolol, and their blood pressure today is 103/52. Patient gets the occasional lightheadedness, cardiology is managing.  S. Patient denies headaches, blurred vision, chest pains, shortness of breath, or weakness. Denies any side effects from medication and is content with current medication.   Relevant past medical, surgical, family and social history reviewed and updated as indicated. Interim medical history since our last visit reviewed. Allergies and medications reviewed and updated.  Review of Systems  Constitutional: Negative for chills and fever.  Eyes: Negative for visual disturbance.  Respiratory: Negative for shortness of breath and wheezing.   Cardiovascular: Negative for chest pain and leg swelling.  Musculoskeletal: Negative for back pain and gait problem.  Skin: Negative for rash.  Neurological: Positive for dizziness and light-headedness. Negative for weakness and numbness.  All  other systems reviewed and are negative.   Per HPI unless specifically indicated above   Allergies as of 08/13/2020   No Known Allergies     Medication List       Accurate as of August 13, 2020  9:08 AM. If you have any questions, ask your nurse or doctor.        STOP taking these medications   Victoza 18 MG/3ML Sopn Generic drug: liraglutide Stopped by: Fransisca Kaufmann Awa Bachicha, MD     TAKE these medications   atorvastatin 40 MG tablet Commonly known as: LIPITOR Take 1 tablet (40 mg total) by mouth daily.   BD Pen Needle Nano 2nd Gen 32G X 4 MM Misc Generic drug: Insulin Pen Needle 1 Package by Does not apply route 2 (two) times daily.   carvedilol 25 MG tablet Commonly known as: COREG TAKE 2 TABLETS BY MOUTH TWICE DAILY WITH A MEAL   Entresto 49-51 MG Generic drug: sacubitril-valsartan Take 1 tablet by mouth twice daily   fluticasone 50 MCG/ACT nasal spray Commonly known as: FLONASE Place 1 spray into both nostrils as needed.   furosemide 40 MG tablet Commonly known as: LASIX Take 1 tablet (40 mg total) by mouth daily.   loperamide 2 MG tablet Commonly known as: Imodium A-D Take as needed   loratadine 10 MG tablet Commonly known as: EQ Loratadine Take 1 tablet (10 mg total) by mouth daily.   metoprolol tartrate 25 MG tablet Commonly known as: LOPRESSOR Take 1 tablet as needed for palpitations   multivitamin with minerals Tabs tablet Take 1  tablet by mouth daily.   ondansetron 4 MG tablet Commonly known as: Zofran Take 1 tablet (4 mg total) by mouth every 8 (eight) hours as needed for nausea or vomiting.   Vitamin D (Ergocalciferol) 1.25 MG (50000 UNIT) Caps capsule Commonly known as: DRISDOL Take 1 capsule (50,000 Units total) by mouth every 7 (seven) days. What changed:   when to take this  additional instructions   VSL#3 Caps Take twice a day   warfarin 5 MG tablet Commonly known as: COUMADIN Take as directed by the anticoagulation clinic. If  you are unsure how to take this medication, talk to your nurse or doctor. Original instructions: TAKE 1 TABLET BY MOUTH ONCE DAILY EXCEPT  TAKE  1  &  1/2  TABLET  EACH  FRIDAY  OR  AS  DIRECTED  BY  ANTICOAGULATION  CLINIC        Objective:   BP (!) 103/52   Pulse 65   Ht _0  (1.905 m)   Wt (!) 360 lb (163.3 kg)   SpO2 99%   BMI 45.00 kg/m   Wt Readings from Last 3 Encounters:  08/13/20 (!) 360 lb (163.3 kg)  08/08/20 (!) 359 lb (162.8 kg)  05/15/20 (!) 396 lb (179.6 kg)    Physical Exam Vitals and nursing note reviewed.  Constitutional:      General: He is not in acute distress.    Appearance: He is well-developed. He is not diaphoretic.  Eyes:     General: No scleral icterus.    Conjunctiva/sclera: Conjunctivae normal.  Neck:     Thyroid: No thyromegaly.  Cardiovascular:     Rate and Rhythm: Normal rate. Rhythm irregular.     Heart sounds: Normal heart sounds. No murmur heard.   Pulmonary:     Effort: Pulmonary effort is normal. No respiratory distress.     Breath sounds: Normal breath sounds. No wheezing.  Musculoskeletal:        General: Normal range of motion.     Cervical back: Neck supple.  Lymphadenopathy:     Cervical: No cervical adenopathy.  Skin:    General: Skin is warm and dry.     Findings: No rash.  Neurological:     Mental Status: He is alert and oriented to person, place, and time.     Coordination: Coordination normal.  Psychiatric:        Behavior: Behavior normal.     Results for orders placed or performed in visit on 08/11/20  POCT INR  Result Value Ref Range   INR 4.5 (A) 2.0 - 3.0    Assessment & Plan:   Problem List Items Addressed This Visit      Cardiovascular and Mediastinum   Chronic systolic congestive heart failure (HCC)   Relevant Medications   furosemide (LASIX) 40 MG tablet   Hypertension associated with diabetes ([place]) - Primary   Relevant Medications   furosemide (LASIX) 40 MG tablet   Other Relevant Orders    CMP14+EGFR   Paroxysmal atrial fibrillation (HCC)   Relevant Medications   furosemide (LASIX) 40 MG tablet     Endocrine   Diabetes mellitus ([place]), Rx Victoza   Relevant Orders   Bayer DCA Hb A1c Waived   Dyslipidemia associated with type 2 diabetes mellitus ([place]), Rx Lipitor      Continue current medication, no changes, sees cardiology and is working with surgery after his gastric bypass. Follow up plan: Return in about 3 months (around 11/12/2020), or if  symptoms worsen or fail to improve, for Diabetes and hypertension.  Counseling provided for all of the vaccine components Orders Placed This Encounter  Procedures  . CMP14+EGFR  . Bayer [facility] Hb A1c Brooks, MD [facility] 08/13/2020, 9:08 AM

## 2020-08-15 ENCOUNTER — Encounter: Payer: Self-pay | Admitting: Cardiovascular Disease

## 2020-08-15 NOTE — Progress Notes (Signed)
Patient ID: Danny Hinton, male   DOB: Feb 25, 1981, 40 y.o.   MRN: [id number]    Cardiology Office Note    Date:  08/15/2020   ID:  Danny Hinton, DOB Feb 25, 1981, MRN [id number]  PCP:  Dettinger, Elige RadonJoshua A, MD  Cardiologist:   Thurmon FairMihai Jenell Dobransky, MD   Chief Complaint  Patient presents with  . Follow-up    After gastric bypass surgery  . Congestive Heart Failure    History of Present Illness:  Danny Hinton is a 40 y.o. male who presents for nonischemic cardiomyopathy, chronic systolic heart failure, defibrillator checkup, history of atrial flutter, atrial fibrillation and paroxysmal atrial tachycardia (which has been sustained and led to heart failure decompensation).  He underwent uncomplicated defibrillator generator change out on March 24, 2020 (his new generator is a Special educational needs teachert Jude Gallant).  Danny had successful gastric bypass surgery (Roux-en-Y 07/28/20).  He lost 11 pounds in the first week after surgery.  Together with his preoperative weight loss efforts, he now weighs about 100 pounds less than at his peak weight in May 2021.  Had some swallowing difficulties early on postop, but now can swallow fluids without any difficulty.  He is back on warfarin.  INR was 4.5.    His device is set up with a lower rate limit of 50 bpm.  He presents with asymptomatic sinus bradycardia 55 bpm.  He has not been troubled by palpitations.  Interrogation of his device shows no meaningful arrhythmia in the perioperative period.  He denies dizziness or syncope.  His blood pressure remains borderline low at 100/60.  Occasionally he will record systolic blood pressures in the 90s, but remains asymptomatic.  He has not had any angina or dyspnea at rest or with activity.  His wife has multiple sclerosis on immunosuppressant drugs.   His September 2019 echocardiogram showed an ejection fraction of around 40%.  In September 2015 he underwent cavotricuspid isthmus ablation for atrial flutter. On 08/19/2014, [place] he develop persistent rapid palpitations and felt unwell. He underwent successful overdrive pacing via his device by Dr. Hillis RangeJames Allred. The rhythm was atypical atrial flutter, cycle length roughly 390 ms. The episode lasted for about 3 hours until he was successfully overdrive paced. In the past he has had paroxysmal atrial fibrillation. The decision was made to ablate his flutter secondary to the occurrence of multiple unnecessary defibrillator shocks in the setting of atrial flutter with rapid ventricular rate.   Past Medical History:  Diagnosis Date  . Anxiety   . Atypical atrial flutter (HCC) 08/19/2014   Pace terminated in device clinic.  AFL cycle length was 390 msec.  Marland Kitchen. Automatic implantable cardioverter-defibrillator in situ    a. St Jude in 2011.  . B12 nutritional deficiency, B12 = 214 (03/18/19) 04/29/2019  . Chronic anticoagulation with Coumadin   . Chronic systolic congestive heart failure (HCC)    a. suspected NICM - dx 2010. EF 15% by TEE, 10-20% by echo at that time. b. s/p St. Jude AICD 2011. c. Echo 01/2010: mod dilated LV, EF 30%, mod aortic root dilitation, no significant valvular disease.  . Depression   . Diabetes mellitus (HCC), Rx Victoza 04/29/2019  . Dyslipidemia associated with type 2 diabetes mellitus (HCC), Rx Lipitor 04/29/2019  . Eczema   . Enlarged aorta (HCC)   . Hypertension associated with diabetes (HCC)   . Mobitz type 2 second degree atrioventricular block    a. During sleeping hours in 2010 suspected due to OSA.  .Marland Kitchen  Morbid obesity (HCC)   . Nonischemic cardiomyopathy (HCC)   . OSA treated with BiPAP 09/04/2018   Severe obstructive sleep apnea with an AHI of 67.2/h and nocturnal hypoxemia with oxygen saturations as low as 82%. Now on BIPAP at 18/14 cm H2O.  Marland Kitchen PAF (paroxysmal atrial fibrillation) (HCC)   . Palpitations   . Paroxysmal atrial flutter (HCC), s/p ablation   . Seasonal allergies   . Sleep apnea   . Thoracic aortic aneurysm (HCC),  monitored annually 04/29/2019  . Vitamin D deficiency, vitamin D = 6.9 (03/28/19) 04/29/2019    Past Surgical History:  Procedure Laterality Date  . ATRIAL FLUTTER ABLATION N/A 01/18/2014   Procedure: ATRIAL FLUTTER ABLATION;  Surgeon: Duke Salvia, MD;  Location: [facility] CATH LAB;  Service: Cardiovascular;  Laterality: N/A;  . CARDIAC DEFIBRILLATOR PLACEMENT  02/17/10   St. Jude Medical 45DR, model number [id number], serial number [id number]  . CARDIOVERSION N/A 12/29/2013   Procedure: CARDIOVERSION;  Surgeon: Duke Salvia, MD;  Location: [facility] OR;  Service: Cardiovascular;  Laterality: N/A;  . ICD GENERATOR CHANGEOUT N/A 03/24/2020   Procedure: ICD GENERATOR CHANGEOUT;  Surgeon: Thurmon Fair, MD;  Location: [facility] INVASIVE CV LAB;  Service: Cardiovascular;  Laterality: N/A;  . TOOTH EXTRACTION N/A 10/23/2012   Procedure: EXTRACTION TEETH 1, 16, 17, 30, 31;  Surgeon: [name], DDS;  Location: [facility] OR;  Service: Oral Surgery;  Laterality: N/A;    Outpatient Medications Prior to Visit  Medication Sig Dispense Refill  . atorvastatin (LIPITOR) 40 MG tablet Take 1 tablet (40 mg total) by mouth daily. 90 tablet 3  . carvedilol (COREG) 25 MG tablet TAKE 2 TABLETS BY MOUTH TWICE DAILY WITH A MEAL 120 tablet 11  . ENTRESTO 49-51 MG Take 1 tablet by mouth twice daily 180 tablet 2  . fluticasone (FLONASE) 50 MCG/ACT nasal spray Place 1 spray into both nostrils as needed. 16 g 11  . Insulin Pen Needle (BD PEN NEEDLE NANO 2ND GEN) 32G X 4 MM MISC 1 Package by Does not apply route 2 (two) times daily. 100 each 0  . loperamide (IMODIUM A-D) 2 MG tablet Take as needed 30 tablet 0  . loratadine (EQ LORATADINE) 10 MG tablet Take 1 tablet (10 mg total) by mouth daily. 90 tablet 3  . metoprolol tartrate (LOPRESSOR) 25 MG tablet Take 1 tablet as needed for palpitations 90 tablet 3  . Multiple Vitamin (MULTIVITAMIN WITH MINERALS) TABS tablet Take 1 tablet by mouth daily.    . ondansetron (ZOFRAN) 4 MG tablet Take 1  tablet (4 mg total) by mouth every 8 (eight) hours as needed for nausea or vomiting. 20 tablet 0  . Probiotic Product (VSL#3) CAPS Take twice a day    . Vitamin D, Ergocalciferol, (DRISDOL) 1.25 MG (50000 UNIT) CAPS capsule Take 1 capsule (50,000 Units total) by mouth every 7 (seven) days. (Patient taking differently: Take 50,000 Units by mouth 2 (two) times a week. Tues and Thurs) 12 capsule 0  . warfarin (COUMADIN) 5 MG tablet TAKE 1 TABLET BY MOUTH ONCE DAILY EXCEPT  TAKE  1  &  1/2  TABLET  EACH  FRIDAY  OR  AS  DIRECTED  BY  ANTICOAGULATION  CLINIC 100 tablet 3  . furosemide (LASIX) 40 MG tablet Take 1 tablet by mouth once daily 90 tablet 0  . liraglutide (VICTOZA) 18 MG/3ML SOPN Inject 1.8 mg into the skin daily. 9 mL 3   No facility-administered medications prior to visit.  Allergies:   Patient has no known allergies.   Social History   Socioeconomic History  . Marital status: Married    Spouse name: Not on file  . Number of children: 1  . Years of education: Not on file  . Highest education level: Not on file  Occupational History  . Occupation: disabled  Tobacco Use  . Smoking status: Never Smoker  . Smokeless tobacco: Never Used  Vaping Use  . Vaping Use: Never used  Substance and Sexual Activity  . Alcohol use: No  . Drug use: No  . Sexual activity: Yes  Other Topics Concern  . Not on file  Social History Narrative  . Not on file   Social Determinants of Health   Financial Resource Strain: Not on file  Food Insecurity: Not on file  Transportation Needs: Not on file  Physical Activity: Not on file  Stress: Not on file  Social Connections: Not on file     Family History:  The patient's family history includes Anxiety disorder in his mother; Arrhythmia in his mother; Colon polyps in his father; Depression in his mother; Diabetes in his paternal grandmother; Heart disease in his maternal grandfather and mother; Hodgkin's lymphoma (age of onset: 40) in his  brother; Irritable bowel syndrome in his maternal aunt.   ROS:   Please see the history of present illness.    ROS All other systems are reviewed and are negative.   PHYSICAL EXAM:   VS:  BP 100/60   Pulse (!) 55   Ht 6\' 3"  (1.905 m)   Wt (!) 359 lb (162.8 kg)   SpO2 96%   BMI 44.87 kg/m      General: Alert, oriented x3, no distress, super obese.  Healthy ICD site, well-healed. Head: no evidence of trauma, PERRL, EOMI, no exophtalmos or lid lag, no myxedema, no xanthelasma; normal ears, nose and oropharynx Neck: normal jugular venous pulsations and no hepatojugular reflux; brisk carotid pulses without delay and no carotid bruits Chest: clear to auscultation, no signs of consolidation by percussion or palpation, normal fremitus, symmetrical and full respiratory excursions Cardiovascular: normal position and quality of the apical impulse, regular rhythm, normal first and second heart sounds, no murmurs, rubs or gallops Abdomen: no tenderness or distention, no masses by palpation, no abnormal pulsatility or arterial bruits, normal bowel sounds, no hepatosplenomegaly Extremities: no clubbing, cyanosis or edema; 2+ radial, ulnar and brachial pulses bilaterally; 2+ right femoral, posterior tibial and dorsalis pedis pulses; 2+ left femoral, posterior tibial and dorsalis pedis pulses; no subclavian or femoral bruits Neurological: grossly nonfocal Psych: Normal mood and affect   Wt Readings from Last 3 Encounters:  08/13/20 (!) 360 lb (163.3 kg)  08/08/20 (!) 359 lb (162.8 kg)  05/15/20 (!) 396 lb (179.6 kg)      Studies/Labs Reviewed:   ECHO 01/27/2018: - Left ventricle: The cavity size was mildly dilated. There was mild concentric hypertrophy. Systolic function was normal. The estimated ejection fraction was 40%. Wall motion was normal; there were no regional wall motion abnormalities. Doppler parameters are consistent with abnormal left ventricular relaxation (grade 1 diastolic  dysfunction). There was no evidence of elevated ventricular filling pressure by Doppler parameters. - Aortic valve: There was no regurgitation. - Aortic root: The aortic root was normal in size. - Mitral valve: There was mild regurgitation. Valve area by pressure half-time: 2.22 cm^2. - Left atrium: The atrium was mildly dilated. - Right ventricle: Pacer wire or catheter noted in right ventricle. Systolic  function was normal. - Right atrium: Pacer wire or catheter noted in right atrium. - Tricuspid valve: There was mild regurgitation. - Pulmonic valve: There was mild regurgitation. - Inferior vena cava: The vessel was normal in size. - Pericardium, extracardiac: There was no pericardial effusion.  Impressions:  - There has been no significant change since the prior study on 07/31/2015, LVEF remains moderately decreased estimated at 40%.   Aortic root is dilated at the sinus level with maximum diameter 45 mm. Ascending aortic size is stable at 40 mm.  EKG:  EKG is ordered today shows sinus bradycardia 55 bpm, otherwise normal tracing, QTC 378 ms Recent Labs: 11/05/2019: Magnesium 2.0 05/15/2020: ALT 20; BUN 12; Creatinine, Ser 0.80; Hemoglobin 12.1; Platelets 285; Potassium 4.2; Sodium 140   Lipid Panel    Component Value Date/Time   CHOL 96 (L) 05/15/2020 1001   TRIG 82 05/15/2020 1001   HDL 30 (L) 05/15/2020 1001   CHOLHDL 3.2 05/15/2020 1001   CHOLHDL 5.3 03/20/2009 0410   VLDL 22 03/20/2009 0410   LDLCALC 49 05/15/2020 1001   LDLDIRECT 52 03/26/2015 0824     ASSESSMENT:    1. Chronic combined systolic and diastolic heart failure (HCC)   2. Non-ischemic cardiomyopathy (HCC)   3. Paroxysmal atrial fibrillation (HCC)   4. ICD (implantable cardioverter-defibrillator), dual, in situ   5. Long term current use of anticoagulant therapy   6. Morbid obesity (HCC)   7. Dyslipidemia (high LDL; low HDL)   8. Diabetes mellitus type 2 in obese (HCC)   9. Essential hypertension       PLAN:  In order of problems listed above:    1. CHF: NYHA functional class 2-3.  Appears clinically euvolemic.  She is steadily losing weight and will have to constantly really address his "dry weight".  At this point his thoracic impedance has just become relevant after his relatively recent generator change.  Also has been loses weight, we may have to cut back on his doses of carvedilol and Entresto.  We will hold off starting an SGLT2 inhibitor which is an imbalance gastric bypass procedure. 2. CMP: Nonischemic mechanism. Most recent LV ejection fraction 40%.  Has not had an echocardiogram since 2019.  Will probably perform another assessment later this year. 3. Paroxysmal atrial tachycardia/atypical flutter/AFib: with history of previous cavotricuspid isthmus flutter ablation and subsequent episodes of symptomatic paroxysmal atrial tachycardia with 1: 1 AV conduction.  Alternative therapies such as redo ablation or antiarrhythmics have been discussed, but we have decided to wait until he loses weight. 4. Warfarin: Had Lovenox bridging for his gastric bypass surgery.  Now back on warfarin. 5. ICD: Device at beginning of battery life.  Remote downloads every 3 months. 6. Obesity: Successful gastric bypass surgery March 2022, already losing weight rapidly. 7. HLP: Chronically low HDL cholesterol, peripheral improvement with weight loss. 8. DM: Currently on Victoza.  Reassess role of SGLT2 inhibitors after weight loss stabilizes. 9. HTN: Borderline low, asymptomatic.  He generally tolerates lower systolic blood pressures, unless lower than 95.  Medication Adjustments/Labs and Tests Ordered: Current medicines are reviewed at length with the patient today.  Concerns regarding medicines are outlined above.  Medication changes, Labs and Tests ordered today are listed in the Patient Instructions below. Patient Instructions  Medication Instructions:  No changes *If you need a refill on your  cardiac medications before your next appointment, please call your pharmacy*   Lab Work: None ordered If you have labs (blood work) drawn  today and your tests are completely normal, you will receive your results only by: Marland Kitchen MyChart Message (if you have MyChart) OR . A paper copy in the mail If you have any lab test that is abnormal or we need to change your treatment, we will call you to review the results.   Testing/Procedures: None ordered   Follow-Up: At [facility], you and your health needs are our priority.  As part of our continuing mission to provide you with exceptional heart care, we have created designated Provider Care Teams.  These Care Teams include your primary Cardiologist (physician) and Advanced Practice Providers (APPs -  Physician Assistants and Nurse Practitioners) who all work together to provide you with the care you need, when you need it.  We recommend signing up for the patient portal called "MyChart".  Sign up information is provided on this After Visit Summary.  MyChart is used to connect with patients for Virtual Visits (Telemedicine).  Patients are able to view lab/test results, encounter notes, upcoming appointments, etc.  Non-urgent messages can be sent to your provider as well.   To learn more about what you can do with MyChart, go to [email or web address].    Your next appointment:   3 month(s) on a device day  The format for your next appointment:   In Person  Provider:   Thurmon Fair, MD         Signed, Thurmon Fair, MD  08/15/2020 2:39 PM    [facility] [address] [place], [place], [place]  [address] Phone: [phone]; Fax: [phone]

## 2020-08-18 ENCOUNTER — Ambulatory Visit (INDEPENDENT_AMBULATORY_CARE_PROVIDER_SITE_OTHER): Payer: Medicaid Other | Admitting: *Deleted

## 2020-08-18 DIAGNOSIS — Z5181 Encounter for therapeutic drug level monitoring: Secondary | ICD-10-CM

## 2020-08-18 DIAGNOSIS — I480 Paroxysmal atrial fibrillation: Secondary | ICD-10-CM

## 2020-08-18 LAB — POCT INR: INR: 3 (ref 2.0–3.0)

## 2020-08-18 NOTE — Patient Instructions (Signed)
S/P Gastric Bypass  Wt loss 31 lbs Continue warfarin 1 tablet daily except 1/2 tablets on Tuesdays  Recheck in 10 days

## 2020-08-19 ENCOUNTER — Other Ambulatory Visit: Payer: Self-pay | Admitting: Surgery

## 2020-08-19 DIAGNOSIS — I712 Thoracic aortic aneurysm, without rupture, unspecified: Secondary | ICD-10-CM

## 2020-08-28 ENCOUNTER — Other Ambulatory Visit (HOSPITAL_COMMUNITY)
Admission: RE | Admit: 2020-08-28 | Discharge: 2020-08-28 | Disposition: A | Discharge status: Home / Self Care | Payer: Medicaid Other | Source: Ambulatory Visit | Attending: Cardiovascular Disease | Admitting: Cardiovascular Disease

## 2020-08-28 ENCOUNTER — Other Ambulatory Visit: Payer: Self-pay

## 2020-08-28 ENCOUNTER — Ambulatory Visit (INDEPENDENT_AMBULATORY_CARE_PROVIDER_SITE_OTHER): Payer: Medicaid Other | Admitting: *Deleted

## 2020-08-28 DIAGNOSIS — I480 Paroxysmal atrial fibrillation: Secondary | ICD-10-CM

## 2020-08-28 DIAGNOSIS — Z5181 Encounter for therapeutic drug level monitoring: Secondary | ICD-10-CM

## 2020-08-28 LAB — PROTIME-INR
INR: 5.8 (ref 0.8–1.2)
Prothrombin Time: 52 seconds — ABNORMAL HIGH (ref 11.4–15.2)

## 2020-08-28 NOTE — Patient Instructions (Signed)
POC INR >8.0  Sent to [facility] lab  INR 5.8 S/P Gastric Bypass  Wt loss 43 lbs Hold warfarin x tonight and tomorrow night then decrease dose to 1/2 tablet daily except 1 tablet on Mondays, Wednesdays and Fridays Recheck on 09/08/20

## 2020-09-08 ENCOUNTER — Ambulatory Visit (INDEPENDENT_AMBULATORY_CARE_PROVIDER_SITE_OTHER): Payer: Medicaid Other | Admitting: *Deleted

## 2020-09-08 ENCOUNTER — Other Ambulatory Visit: Payer: Self-pay

## 2020-09-08 DIAGNOSIS — Z5181 Encounter for therapeutic drug level monitoring: Secondary | ICD-10-CM

## 2020-09-08 DIAGNOSIS — I480 Paroxysmal atrial fibrillation: Secondary | ICD-10-CM | POA: Diagnosis not present

## 2020-09-08 LAB — POCT INR: INR: 6.7 — AB (ref 2.0–3.0)

## 2020-09-08 NOTE — Patient Instructions (Addendum)
S/P Gastric Bypass  Wt loss 60 lbs Hold warfarin x 3 days then decrease dose to 1/2 tablet daily  Recheck on 09/16/20  No s/s of bleeding. Bleeding and fall precautions discussed with pt and he verbalized understanding.

## 2020-09-10 ENCOUNTER — Encounter: Payer: Self-pay | Admitting: Surgery

## 2020-09-16 DIAGNOSIS — R112 Nausea with vomiting, unspecified: Secondary | ICD-10-CM | POA: Diagnosis not present

## 2020-09-16 DIAGNOSIS — Z9889 Other specified postprocedural states: Secondary | ICD-10-CM | POA: Diagnosis not present

## 2020-09-17 ENCOUNTER — Ambulatory Visit: Payer: Medicaid Other | Admitting: Surgery

## 2020-09-17 ENCOUNTER — Ambulatory Visit
Admission: RE | Admit: 2020-09-17 | Discharge: 2020-09-17 | Disposition: A | Discharge status: Home / Self Care | Payer: Medicaid Other | Source: Ambulatory Visit | Attending: Surgery | Admitting: Surgery

## 2020-09-17 DIAGNOSIS — K760 Fatty (change of) liver, not elsewhere classified: Secondary | ICD-10-CM | POA: Diagnosis not present

## 2020-09-17 DIAGNOSIS — J9811 Atelectasis: Secondary | ICD-10-CM | POA: Diagnosis not present

## 2020-09-17 DIAGNOSIS — I712 Thoracic aortic aneurysm, without rupture, unspecified: Secondary | ICD-10-CM

## 2020-09-17 DIAGNOSIS — Z950 Presence of cardiac pacemaker: Secondary | ICD-10-CM | POA: Diagnosis not present

## 2020-09-17 IMAGING — CT CT ANGIO CHEST
2 of 6 series · 13 of 36 positions shown · IV contrast (iopamidol)
Comparison: Chest CT September 26, 2019

CLINICAL DATA: Thoracic aortic prominence

EXAM:
CT ANGIOGRAPHY CHEST WITH CONTRAST
TECHNIQUE: Multidetector CT imaging of the chest was performed using the
standard protocol during bolus administration of intravenous
contrast. Multiplanar CT image reconstructions and MIPs were
obtained to evaluate the vascular anatomy.
CONTRAST:  75mL Q4EB4O-T34 IOPAMIDOL (Q4EB4O-T34) INJECTION 76%

[Series 4: cta thorax 2.00 bv36 s3 axial arterial · axial · arterial · 0.63mm/px · z∈[+1536,+1820]mm · 12 of 168 slices shown]
[im 13/168  lung]
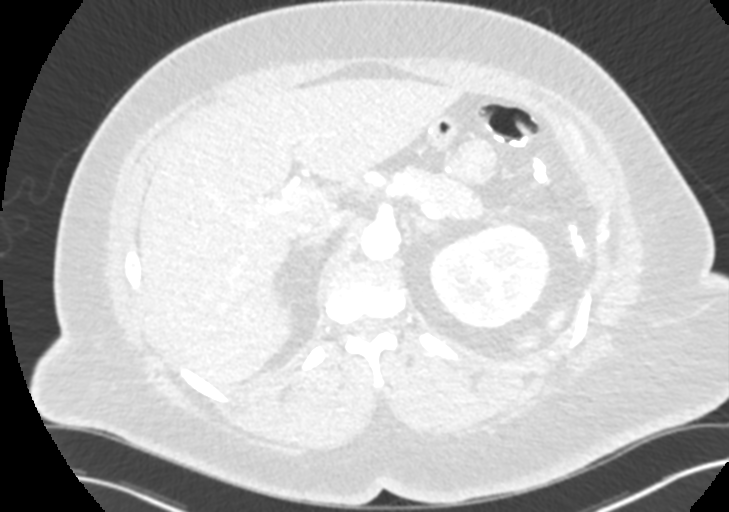
[im 26/168  mediastinal]
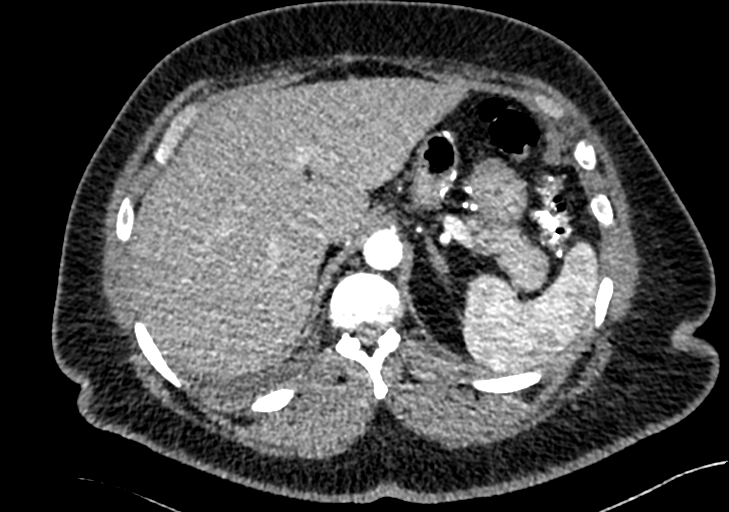
[im 39/168  lung]
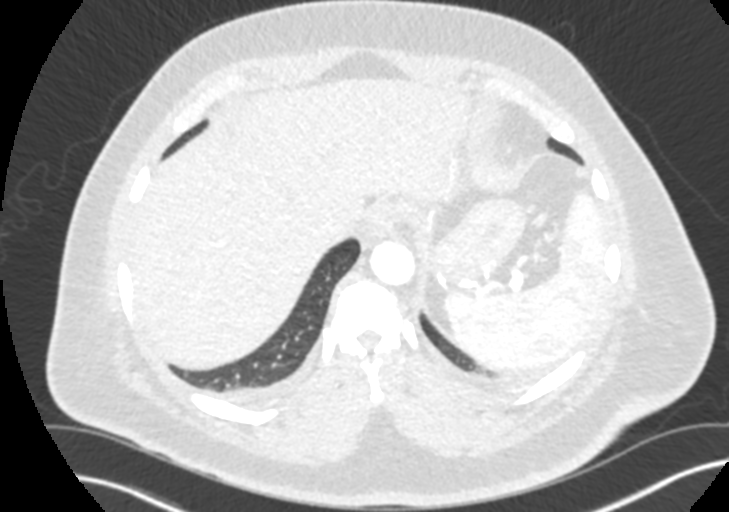
[im 52/168  mediastinal]
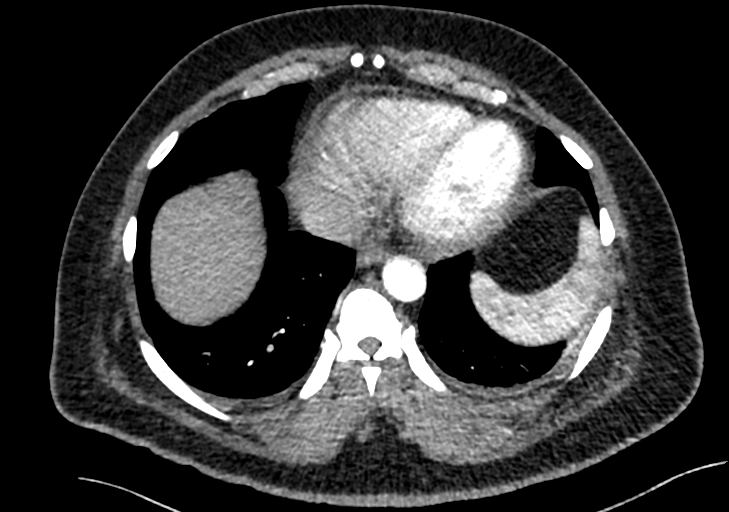
[im 65/168  lung]
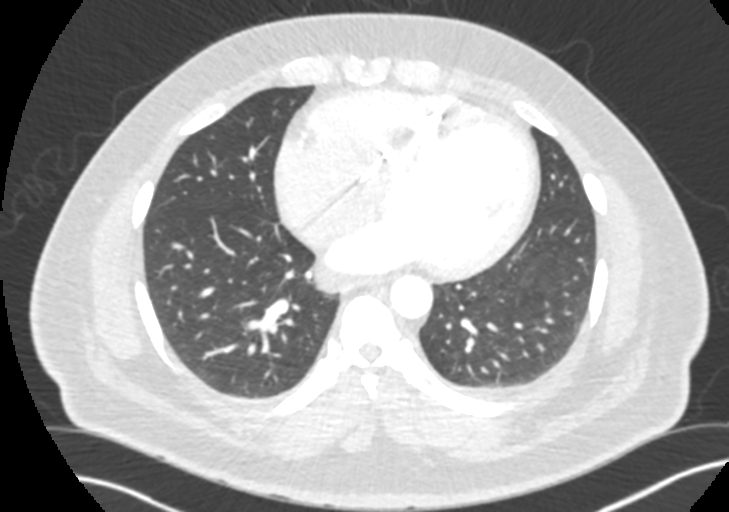
[im 78/168  mediastinal]
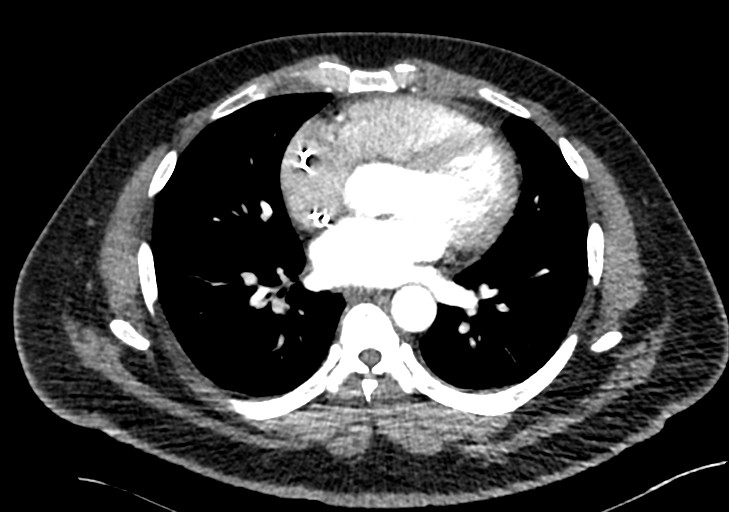
[im 90/168  lung]
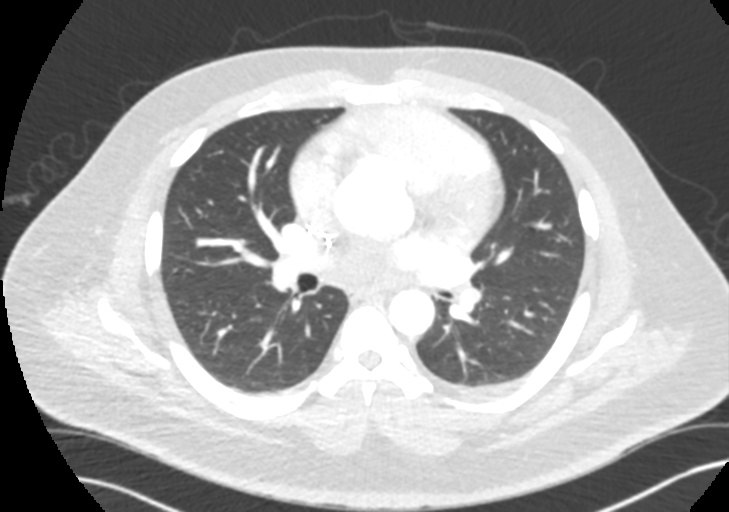
[im 103/168  mediastinal]
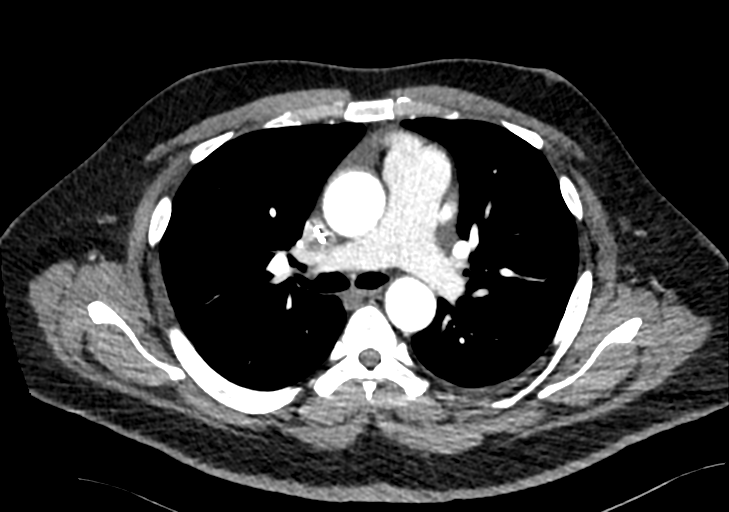
[im 116/168  lung]
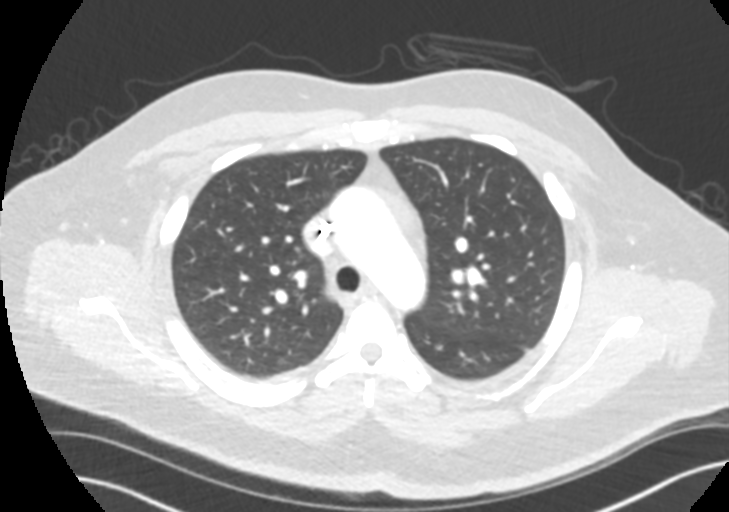
[im 129/168  mediastinal]
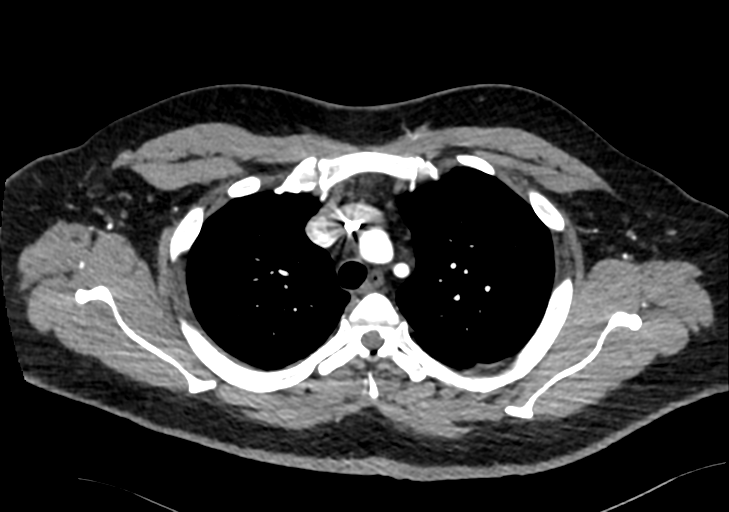
[im 142/168  lung]
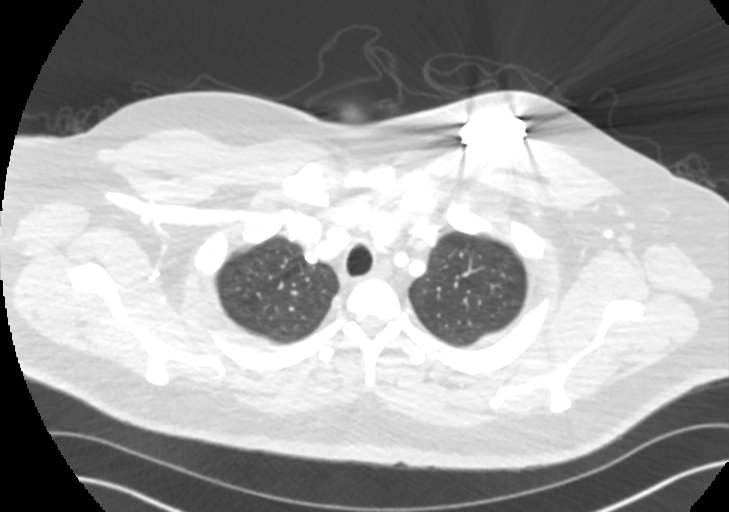
[im 155/168  mediastinal]
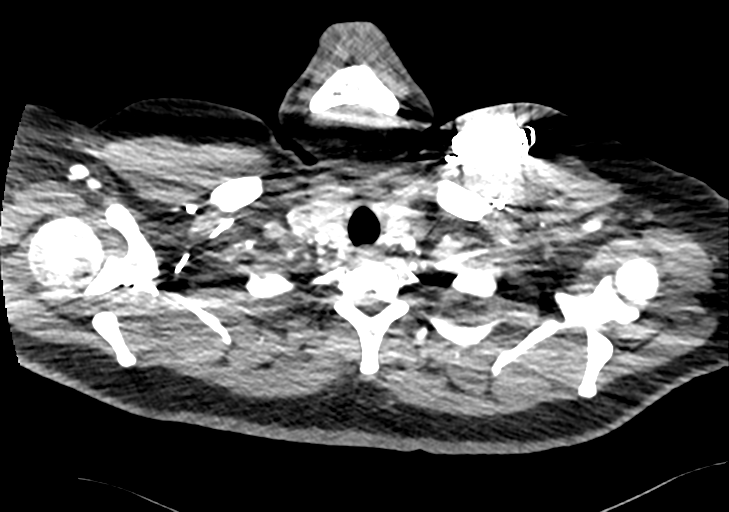

[Series 9: cta thorax 2.00 bv36 s3 cor st · coronal · 0.66mm/px · 1 of 161 slices shown]
[im 81/161  mediastinal]
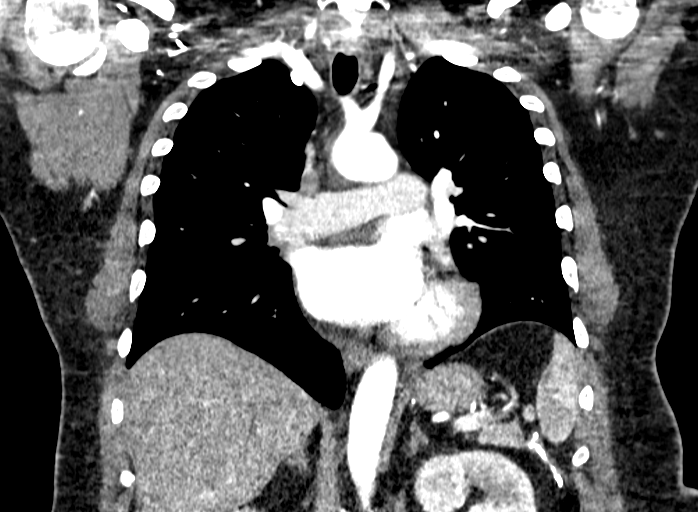

[13 of 36 positions shown; findings below may reference images not displayed]

FINDINGS: Cardiovascular: Ascending thoracic aorta measures 4.0 x 4.0 cm,
stable. Measured diameter at the sinuses of Valsalva measures
cm, essentially stable compared to prior study allowing for slight
difference in scan plane. Measured diameter at the sinotubular
junction measures 3.6 cm, stable. Measured diameter at the arch
level measures 3.7 cm. Measured diameter of the descending aorta at
the main pulmonary outflow tract level measures 3.3 x 3.3 cm. No
evident thoracic aortic dissection. Visualized great vessels appear
unremarkable. Note that the right innominate and left common carotid
arteries arise as a common trunk, an anatomic variant. No
pericardial effusion or pericardial thickening. Pacemaker leads
attached to right atrium and right ventricle. No evident pulmonary
embolus.

Mediastinum/Nodes: No evident thyroid lesions. No thoracic aortic
aneurysm. No esophageal lesions.

Lungs/Pleura: There is slight bibasilar atelectasis. No edema or
airspace opacity. No pleural effusions. Trachea and major bronchial
structures appear normal. No pneumothorax.

Upper Abdomen: Evident hepatic steatosis. Visualized upper abdominal
structures otherwise appear unremarkable.

Musculoskeletal: No blastic or lytic bone lesions. Pacemaker device
on the left anteriorly.

Review of the MIP images confirms the above findings.
IMPRESSION: 1. Ascending thoracic aortic diameter measures 4.0 x 4.0 cm, stable.
Measured diameter at the sinuses of Valsalva measures 4.8 cm, not
felt to be changed allowing for slight differences in scan plane
compared to prior study. Other measurements as described. Ascending
thoracic aortic aneurysm. Recommend semi-annual imaging followup by
CTA or MRA and referral to cardiothoracic surgery if not already
obtained. This recommendation follows 5686
ACCF/AHA/AATS/ACR/ASA/SCA/NEYSHALIE/JIANG/COLTON/TULLIS Guidelines for the
Diagnosis and Management of Patients With Thoracic Aortic Disease.
Circulation. 5686; 121: E266-e369. Aortic aneurysm NOS
(P1BW7-UFA.1).
2. No evident pulmonary embolus.
3. No edema or airspace opacity.  Slight bibasilar atelectasis.
4. No adenopathy.
5. Hepatic steatosis.
6. Pacemaker leads attached to right atrium and right ventricle.

Aortic aneurysm NOS (P1BW7-UFA.1).

## 2020-09-17 MED ORDER — IOPAMIDOL (ISOVUE-370) INJECTION 76%
75.0000 mL | Freq: Once | INTRAVENOUS | Status: AC | PRN
  Administered 2020-09-17: 75 mL via INTRAVENOUS

## 2020-09-18 ENCOUNTER — Ambulatory Visit (INDEPENDENT_AMBULATORY_CARE_PROVIDER_SITE_OTHER): Payer: Medicaid Other | Admitting: *Deleted

## 2020-09-18 DIAGNOSIS — Z5181 Encounter for therapeutic drug level monitoring: Secondary | ICD-10-CM

## 2020-09-18 DIAGNOSIS — I480 Paroxysmal atrial fibrillation: Secondary | ICD-10-CM | POA: Diagnosis not present

## 2020-09-18 LAB — POCT INR: INR: 7.3 — AB (ref 2.0–3.0)

## 2020-09-18 NOTE — Patient Instructions (Signed)
S/P Gastric Bypass  Wt loss 60 lbs Been in hospital for dehydration and nausea Hold warfarin x 4 days  Recheck on 09/22/20  No s/s of bleeding. Bleeding and fall precautions discussed with pt and he verbalized understanding.

## 2020-09-22 ENCOUNTER — Telehealth: Payer: Self-pay | Admitting: *Deleted

## 2020-09-22 ENCOUNTER — Ambulatory Visit (INDEPENDENT_AMBULATORY_CARE_PROVIDER_SITE_OTHER): Payer: Medicaid Other | Admitting: *Deleted

## 2020-09-22 DIAGNOSIS — Z7901 Long term (current) use of anticoagulants: Secondary | ICD-10-CM | POA: Diagnosis not present

## 2020-09-22 DIAGNOSIS — I480 Paroxysmal atrial fibrillation: Secondary | ICD-10-CM | POA: Diagnosis not present

## 2020-09-22 LAB — POCT INR: INR: 1.6 — AB (ref 2.0–3.0)

## 2020-09-22 MED ORDER — WARFARIN SODIUM 1 MG PO TABS: 1.0000 mg | ORAL_TABLET | Freq: Every day | ORAL | 3 refills | Status: DC

## 2020-09-22 NOTE — Patient Instructions (Signed)
Changed warfarin to a 1mg  tablet S/P Gastric Bypass  Wt loss 60 lbs Take 2 tablets tonight then start 1 tablet daily Recheck on 09/29/20

## 2020-09-22 NOTE — Telephone Encounter (Signed)
Patient returning call to move appointment?

## 2020-09-22 NOTE — Telephone Encounter (Signed)
Forwarded to [employer]

## 2020-09-23 ENCOUNTER — Ambulatory Visit (INDEPENDENT_AMBULATORY_CARE_PROVIDER_SITE_OTHER): Payer: Medicaid Other

## 2020-09-23 DIAGNOSIS — I428 Other cardiomyopathies: Secondary | ICD-10-CM

## 2020-09-23 DIAGNOSIS — G4733 Obstructive sleep apnea (adult) (pediatric): Secondary | ICD-10-CM | POA: Diagnosis not present

## 2020-09-23 DIAGNOSIS — I5042 Chronic combined systolic (congestive) and diastolic (congestive) heart failure: Secondary | ICD-10-CM | POA: Diagnosis not present

## 2020-09-23 LAB — CUP PACEART REMOTE DEVICE CHECK
Battery Remaining Longevity: 99 mo
Battery Remaining Percentage: 93 %
Battery Voltage: 3.02 V
Brady Statistic AP VP Percent: 1 %
Brady Statistic AP VS Percent: 5.6 %
Brady Statistic AS VP Percent: 1 %
Brady Statistic AS VS Percent: 94 %
Brady Statistic RA Percent Paced: 5.2 %
Brady Statistic RV Percent Paced: 1 %
Date Time Interrogation Session: 20220517030512
HighPow Impedance: 44 Ohm
Implantable Lead Implant Date: 20111010
Implantable Lead Implant Date: 20111010
Implantable Lead Location: 753859
Implantable Lead Location: 753860
Implantable Pulse Generator Implant Date: 20211115
Lead Channel Impedance Value: 440 Ohm
Lead Channel Impedance Value: 440 Ohm
Lead Channel Pacing Threshold Amplitude: 0.75 V
Lead Channel Pacing Threshold Amplitude: 1.25 V
Lead Channel Pacing Threshold Pulse Width: 0.5 ms
Lead Channel Pacing Threshold Pulse Width: 1 ms
Lead Channel Sensing Intrinsic Amplitude: 11.7 mV
Lead Channel Sensing Intrinsic Amplitude: 2.7 mV
Lead Channel Setting Pacing Amplitude: 2.5 V
Lead Channel Setting Pacing Amplitude: 2.5 V
Lead Channel Setting Pacing Pulse Width: 0.5 ms
Lead Channel Setting Sensing Sensitivity: 0.5 mV
Pulse Gen Serial Number: 111031056

## 2020-09-29 ENCOUNTER — Ambulatory Visit (INDEPENDENT_AMBULATORY_CARE_PROVIDER_SITE_OTHER): Payer: Medicaid Other | Admitting: *Deleted

## 2020-09-29 DIAGNOSIS — I480 Paroxysmal atrial fibrillation: Secondary | ICD-10-CM

## 2020-09-29 DIAGNOSIS — Z7901 Long term (current) use of anticoagulants: Secondary | ICD-10-CM

## 2020-09-29 DIAGNOSIS — Z5181 Encounter for therapeutic drug level monitoring: Secondary | ICD-10-CM | POA: Diagnosis not present

## 2020-09-29 LAB — POCT INR: INR: 1.3 — AB (ref 2.0–3.0)

## 2020-09-29 NOTE — Patient Instructions (Signed)
Changed warfarin to a 1mg  tablet S/P Gastric Bypass  Wt loss 60 lbs Take 2 tablets tonight then increase dose to 1 tablet daily except 2 tablets on Tuesdays, Thursdays and Saturdays Recheck on 10/09/20

## 2020-10-01 ENCOUNTER — Ambulatory Visit: Payer: Medicaid Other | Admitting: Surgery

## 2020-10-08 ENCOUNTER — Ambulatory Visit (INDEPENDENT_AMBULATORY_CARE_PROVIDER_SITE_OTHER): Payer: Medicaid Other | Admitting: *Deleted

## 2020-10-08 DIAGNOSIS — I480 Paroxysmal atrial fibrillation: Secondary | ICD-10-CM

## 2020-10-08 DIAGNOSIS — Z5181 Encounter for therapeutic drug level monitoring: Secondary | ICD-10-CM

## 2020-10-08 LAB — POCT INR: INR: 1.2 — AB (ref 2.0–3.0)

## 2020-10-08 NOTE — Patient Instructions (Signed)
Changed warfarin to a 1mg  tablet S/P Gastric Bypass  Wt loss 69 lbs Increase warfarin to 2 tablets daily Recheck on 10/14/20

## 2020-10-14 ENCOUNTER — Ambulatory Visit (INDEPENDENT_AMBULATORY_CARE_PROVIDER_SITE_OTHER): Payer: Medicaid Other | Admitting: *Deleted

## 2020-10-14 DIAGNOSIS — I480 Paroxysmal atrial fibrillation: Secondary | ICD-10-CM

## 2020-10-14 DIAGNOSIS — Z5181 Encounter for therapeutic drug level monitoring: Secondary | ICD-10-CM | POA: Diagnosis not present

## 2020-10-14 LAB — POCT INR: INR: 1.3 — AB (ref 2.0–3.0)

## 2020-10-14 NOTE — Patient Instructions (Signed)
Use 5mg  tablet this week S/P Gastric Bypass  Wt loss 69 lbs Take warfarin 5mg  tonight and tomorrow night then take 2.5mg  daily except 5mg  on Saturday Recheck on 10/20/20

## 2020-10-16 NOTE — Progress Notes (Signed)
Remote ICD transmission.   

## 2020-10-20 ENCOUNTER — Ambulatory Visit (INDEPENDENT_AMBULATORY_CARE_PROVIDER_SITE_OTHER): Payer: Medicaid Other | Admitting: *Deleted

## 2020-10-20 DIAGNOSIS — I480 Paroxysmal atrial fibrillation: Secondary | ICD-10-CM

## 2020-10-20 DIAGNOSIS — Z5181 Encounter for therapeutic drug level monitoring: Secondary | ICD-10-CM | POA: Diagnosis not present

## 2020-10-20 LAB — POCT INR: INR: 1.4 — AB (ref 2.0–3.0)

## 2020-10-20 NOTE — Patient Instructions (Signed)
Use 5mg  tablet this week S/P Gastric Bypass  Wt loss 69 lbs Take warfarin 5mg  daily except 1/2 tablet on Sunday and Friday Recheck on 10/27/20

## 2020-10-22 DIAGNOSIS — Z713 Dietary counseling and surveillance: Secondary | ICD-10-CM | POA: Diagnosis not present

## 2020-10-27 ENCOUNTER — Other Ambulatory Visit: Payer: Self-pay

## 2020-10-27 ENCOUNTER — Ambulatory Visit (INDEPENDENT_AMBULATORY_CARE_PROVIDER_SITE_OTHER): Payer: Medicaid Other | Admitting: *Deleted

## 2020-10-27 DIAGNOSIS — I480 Paroxysmal atrial fibrillation: Secondary | ICD-10-CM

## 2020-10-27 DIAGNOSIS — Z5181 Encounter for therapeutic drug level monitoring: Secondary | ICD-10-CM | POA: Diagnosis not present

## 2020-10-27 LAB — POCT INR: INR: 1.8 — AB (ref 2.0–3.0)

## 2020-10-27 NOTE — Patient Instructions (Signed)
Use 5mg  tablet this week S/P Gastric Bypass  Wt loss 71 lbs Take warfarin 5mg  daily  Recheck on 11/03/20

## 2020-10-31 ENCOUNTER — Other Ambulatory Visit: Payer: Self-pay | Admitting: Cardiovascular Disease

## 2020-11-03 ENCOUNTER — Other Ambulatory Visit: Payer: Self-pay

## 2020-11-03 ENCOUNTER — Ambulatory Visit (INDEPENDENT_AMBULATORY_CARE_PROVIDER_SITE_OTHER): Payer: Medicaid Other | Admitting: *Deleted

## 2020-11-03 DIAGNOSIS — Z5181 Encounter for therapeutic drug level monitoring: Secondary | ICD-10-CM | POA: Diagnosis not present

## 2020-11-03 DIAGNOSIS — I480 Paroxysmal atrial fibrillation: Secondary | ICD-10-CM | POA: Diagnosis not present

## 2020-11-03 LAB — POCT INR: INR: 2.4 (ref 2.0–3.0)

## 2020-11-03 NOTE — Patient Instructions (Signed)
Use 5mg  tablet this week S/P Gastric Bypass  Wt loss 71 lbs Continue warfarin 5mg  daily  Recheck on 11/12/20

## 2020-11-12 ENCOUNTER — Ambulatory Visit (INDEPENDENT_AMBULATORY_CARE_PROVIDER_SITE_OTHER): Payer: Medicaid Other | Admitting: *Deleted

## 2020-11-12 ENCOUNTER — Other Ambulatory Visit: Payer: Self-pay

## 2020-11-12 DIAGNOSIS — Z5181 Encounter for therapeutic drug level monitoring: Secondary | ICD-10-CM

## 2020-11-12 DIAGNOSIS — I480 Paroxysmal atrial fibrillation: Secondary | ICD-10-CM | POA: Diagnosis not present

## 2020-11-12 LAB — POCT INR: INR: 3.1 — AB (ref 2.0–3.0)

## 2020-11-12 NOTE — Patient Instructions (Signed)
Use 5mg  tablet  S/P Gastric Bypass  Wt loss 78 lbs Decrease warfarin to 1 tablet daily except 1/2 tablet on Wednesdays  Recheck on 11/24/20

## 2020-11-24 ENCOUNTER — Ambulatory Visit (INDEPENDENT_AMBULATORY_CARE_PROVIDER_SITE_OTHER): Payer: Medicaid Other | Admitting: *Deleted

## 2020-11-24 ENCOUNTER — Other Ambulatory Visit: Payer: Self-pay

## 2020-11-24 DIAGNOSIS — Z5181 Encounter for therapeutic drug level monitoring: Secondary | ICD-10-CM | POA: Diagnosis not present

## 2020-11-24 DIAGNOSIS — I480 Paroxysmal atrial fibrillation: Secondary | ICD-10-CM

## 2020-11-24 LAB — POCT INR: INR: 2.2 (ref 2.0–3.0)

## 2020-11-24 NOTE — Patient Instructions (Signed)
Use 5mg  tablet  S/P Gastric Bypass  Wt loss 78 lbs Continue warfarin 1 tablet daily except 1/2 tablet on Wednesdays  Recheck in 2 wks

## 2020-11-28 ENCOUNTER — Ambulatory Visit (INDEPENDENT_AMBULATORY_CARE_PROVIDER_SITE_OTHER): Payer: Medicaid Other | Admitting: Family Medicine

## 2020-11-28 ENCOUNTER — Encounter: Payer: Self-pay | Admitting: Family Medicine

## 2020-11-28 DIAGNOSIS — J069 Acute upper respiratory infection, unspecified: Secondary | ICD-10-CM | POA: Diagnosis not present

## 2020-11-28 NOTE — Progress Notes (Signed)
Virtual Visit via Telephone Note  I connected with Danny E Hascall on 11/28/20 at 2:43 PM by telephone and verified that I am speaking with the correct person using two identifiers. Danny Hinton is currently located at home and his wife is currently with him during this visit. The provider, Gwenlyn Fudge, FNP is located in their office at time of visit.  I discussed the limitations, risks, security and privacy concerns of performing an evaluation and management service by telephone and the availability of in person appointments. I also discussed with the patient that there may be a patient responsible charge related to this service. The patient expressed understanding and agreed to proceed.  Subjective: PCP: Danny Hinton, Danny Radon, Danny Hinton  Chief Complaint  Patient presents with   URI   Patient complains of cough, head congestion, sore throat, and postnasal drainage. Onset of symptoms was 2 days ago, unchanged since that time. He is drinking plenty of fluids. Evaluation to date: at home COVID test was negative. Treatment to date: antihistamines, nasal steroids, and Nyquil .  He does not smoke.    ROS: Per HPI  Current Outpatient Medications:    atorvastatin (LIPITOR) 40 MG tablet, Take 1 tablet (40 mg total) by mouth daily., Disp: 90 tablet, Rfl: 3   carvedilol (COREG) 25 MG tablet, TAKE 2 TABLETS BY MOUTH TWICE DAILY WITH A MEAL, Disp: 120 tablet, Rfl: 11   fluticasone (FLONASE) 50 MCG/ACT nasal spray, Place 1 spray into both nostrils as needed., Disp: 16 g, Rfl: 11   furosemide (LASIX) 40 MG tablet, Take 1 tablet (40 mg total) by mouth daily., Disp: 90 tablet, Rfl: 3   Insulin Pen Needle (BD PEN NEEDLE NANO 2ND GEN) 32G X 4 MM MISC, 1 Package by Does not apply route 2 (two) times daily., Disp: 100 each, Rfl: 0   loperamide (IMODIUM A-D) 2 MG tablet, Take as needed, Disp: 30 tablet, Rfl: 0   loratadine (EQ LORATADINE) 10 MG tablet, Take 1 tablet (10 mg total) by mouth daily., Disp: 90 tablet, Rfl:  3   metoprolol tartrate (LOPRESSOR) 25 MG tablet, Take 1 tablet as needed for palpitations, Disp: 90 tablet, Rfl: 3   Multiple Vitamin (MULTIVITAMIN WITH MINERALS) TABS tablet, Take 1 tablet by mouth daily., Disp: , Rfl:    ondansetron (ZOFRAN) 4 MG tablet, Take 1 tablet (4 mg total) by mouth every 8 (eight) hours as needed for nausea or vomiting., Disp: 20 tablet, Rfl: 0   Probiotic Product (VSL#3) CAPS, Take twice a day, Disp:  , Rfl:    sacubitril-valsartan (ENTRESTO) 49-51 MG, Take 1 tablet by mouth twice daily, Disp: 180 tablet, Rfl: 1   Vitamin D, Ergocalciferol, (DRISDOL) 1.25 MG (50000 UNIT) CAPS capsule, Take 1 capsule (50,000 Units total) by mouth every 7 (seven) days. (Patient taking differently: Take 50,000 Units by mouth 2 (two) times a week. Tues and Thurs), Disp: 12 capsule, Rfl: 0   warfarin (COUMADIN) 1 MG tablet, Take 1 tablet (1 mg total) by mouth daily. Or as directed, Disp: 45 tablet, Rfl: 3  No Known Allergies Past Medical History:  Diagnosis Date   Anxiety    Atypical atrial flutter (HCC) 08/19/2014   Pace terminated in device clinic.  AFL cycle length was 390 msec.   Automatic implantable cardioverter-defibrillator in situ    a. St Jude in 2011.   B12 nutritional deficiency, B12 = 214 (03/18/19) 04/29/2019   Chronic anticoagulation with Coumadin    Chronic systolic congestive heart failure (  HCC)    a. suspected NICM - dx 2010. EF 15% by TEE, 10-20% by echo at that time. b. s/p St. Jude AICD 2011. c. Echo 01/2010: mod dilated LV, EF 30%, mod aortic root dilitation, no significant valvular disease.   Depression    Diabetes mellitus (HCC), Rx Victoza 04/29/2019   Dyslipidemia associated with type 2 diabetes mellitus (HCC), Rx Lipitor 04/29/2019   Eczema    Enlarged aorta (HCC)    Hypertension associated with diabetes (HCC)    Mobitz type 2 second degree atrioventricular block    a. During sleeping hours in 2010 suspected due to OSA.   Morbid obesity (HCC)     Nonischemic cardiomyopathy (HCC)    OSA treated with BiPAP 09/04/2018   Severe obstructive sleep apnea with an AHI of 67.2/h and nocturnal hypoxemia with oxygen saturations as low as 82%. Now on BIPAP at 18/14 cm H2O.   PAF (paroxysmal atrial fibrillation) (HCC)    Palpitations    Paroxysmal atrial flutter (HCC), s/p ablation    Seasonal allergies    Sleep apnea    Thoracic aortic aneurysm (HCC), monitored annually 04/29/2019   Vitamin D deficiency, vitamin D = 6.9 (03/28/19) 04/29/2019    Observations/Objective: A&O  No respiratory distress or wheezing audible over the phone Mood, judgement, and thought processes all WNL  Assessment and Plan: 1. Viral URI Discussed symptom management. Encouraged to let us know if symptoms are not improving after a week.   Follow Up Instructions:  I discussed the assessment and treatment plan with the patient. The patient was provided an opportunity to ask questions and all were answered. The patient agreed with the plan and demonstrated an understanding of the instructions.   The patient was advised to call back or seek an in-person evaluation if the symptoms worsen or if the condition fails to improve as anticipated.  The above assessment and management plan was discussed with the patient. The patient verbalized understanding of and has agreed to the management plan. Patient is aware to call the clinic if symptoms persist or worsen. Patient is aware when to return to the clinic for a follow-up visit. Patient educated on when it is appropriate to go to the emergency department.   Time call ended: 2:54 PM  I provided 11 minutes of non-face-to-face time during this encounter.  [name], MSN, APRN, FNP-C [facility] 11/28/20

## 2020-12-18 ENCOUNTER — Ambulatory Visit (INDEPENDENT_AMBULATORY_CARE_PROVIDER_SITE_OTHER): Payer: Medicaid Other | Admitting: *Deleted

## 2020-12-18 DIAGNOSIS — Z5181 Encounter for therapeutic drug level monitoring: Secondary | ICD-10-CM

## 2020-12-18 DIAGNOSIS — I480 Paroxysmal atrial fibrillation: Secondary | ICD-10-CM

## 2020-12-18 LAB — POCT INR: INR: 2.5 (ref 2.0–3.0)

## 2020-12-18 NOTE — Patient Instructions (Signed)
Use 5mg  tablet  S/P Gastric Bypass  Wt loss 78 lbs Continue warfarin 1 tablet daily except 1/2 tablet on Wednesdays  Recheck in 3 wks

## 2020-12-23 ENCOUNTER — Ambulatory Visit (INDEPENDENT_AMBULATORY_CARE_PROVIDER_SITE_OTHER): Payer: Medicaid Other

## 2020-12-23 DIAGNOSIS — I428 Other cardiomyopathies: Secondary | ICD-10-CM | POA: Diagnosis not present

## 2020-12-24 LAB — CUP PACEART REMOTE DEVICE CHECK
Battery Remaining Longevity: 95 mo
Battery Remaining Percentage: 91 %
Battery Voltage: 3.02 V
Brady Statistic AP VP Percent: 1 %
Brady Statistic AP VS Percent: 6.6 %
Brady Statistic AS VP Percent: 1 %
Brady Statistic AS VS Percent: 93 %
Brady Statistic RA Percent Paced: 6.2 %
Brady Statistic RV Percent Paced: 1 %
Date Time Interrogation Session: 20220817100140
HighPow Impedance: 42 Ohm
Implantable Lead Implant Date: 20111010
Implantable Lead Implant Date: 20111010
Implantable Lead Location: 753859
Implantable Lead Location: 753860
Implantable Pulse Generator Implant Date: 20211115
Lead Channel Impedance Value: 400 Ohm
Lead Channel Impedance Value: 440 Ohm
Lead Channel Pacing Threshold Amplitude: 0.75 V
Lead Channel Pacing Threshold Amplitude: 1.25 V
Lead Channel Pacing Threshold Pulse Width: 0.5 ms
Lead Channel Pacing Threshold Pulse Width: 1 ms
Lead Channel Sensing Intrinsic Amplitude: 11.7 mV
Lead Channel Sensing Intrinsic Amplitude: 2.9 mV
Lead Channel Setting Pacing Amplitude: 2.5 V
Lead Channel Setting Pacing Amplitude: 2.5 V
Lead Channel Setting Pacing Pulse Width: 0.5 ms
Lead Channel Setting Sensing Sensitivity: 0.5 mV
Pulse Gen Serial Number: 111031056

## 2021-01-13 ENCOUNTER — Other Ambulatory Visit: Payer: Self-pay

## 2021-01-13 ENCOUNTER — Ambulatory Visit (INDEPENDENT_AMBULATORY_CARE_PROVIDER_SITE_OTHER): Payer: Medicaid Other | Admitting: *Deleted

## 2021-01-13 DIAGNOSIS — I480 Paroxysmal atrial fibrillation: Secondary | ICD-10-CM | POA: Diagnosis not present

## 2021-01-13 DIAGNOSIS — Z5181 Encounter for therapeutic drug level monitoring: Secondary | ICD-10-CM | POA: Diagnosis not present

## 2021-01-13 LAB — POCT INR: INR: 2.9 (ref 2.0–3.0)

## 2021-01-13 NOTE — Patient Instructions (Signed)
Use 5mg tablet  S/P Gastric Bypass   Continue warfarin 1 tablet daily except 1/2 tablet on Wednesdays  Recheck in 4 wks   

## 2021-01-13 NOTE — Progress Notes (Signed)
Remote ICD transmission.   

## 2021-01-20 DIAGNOSIS — Z713 Dietary counseling and surveillance: Secondary | ICD-10-CM | POA: Diagnosis not present

## 2021-01-20 DIAGNOSIS — E669 Obesity, unspecified: Secondary | ICD-10-CM | POA: Diagnosis not present

## 2021-01-26 ENCOUNTER — Encounter: Payer: Medicaid Other | Admitting: Cardiovascular Disease

## 2021-02-03 DIAGNOSIS — E782 Mixed hyperlipidemia: Secondary | ICD-10-CM | POA: Diagnosis not present

## 2021-02-03 DIAGNOSIS — R112 Nausea with vomiting, unspecified: Secondary | ICD-10-CM | POA: Diagnosis not present

## 2021-02-03 DIAGNOSIS — E119 Type 2 diabetes mellitus without complications: Secondary | ICD-10-CM | POA: Diagnosis not present

## 2021-02-03 DIAGNOSIS — Z95810 Presence of automatic (implantable) cardiac defibrillator: Secondary | ICD-10-CM | POA: Diagnosis not present

## 2021-02-03 DIAGNOSIS — K912 Postsurgical malabsorption, not elsewhere classified: Secondary | ICD-10-CM | POA: Diagnosis not present

## 2021-02-03 DIAGNOSIS — Z9889 Other specified postprocedural states: Secondary | ICD-10-CM | POA: Diagnosis not present

## 2021-02-03 DIAGNOSIS — I4891 Unspecified atrial fibrillation: Secondary | ICD-10-CM | POA: Diagnosis not present

## 2021-02-03 DIAGNOSIS — G4733 Obstructive sleep apnea (adult) (pediatric): Secondary | ICD-10-CM | POA: Diagnosis not present

## 2021-02-03 DIAGNOSIS — K449 Diaphragmatic hernia without obstruction or gangrene: Secondary | ICD-10-CM | POA: Diagnosis not present

## 2021-02-03 DIAGNOSIS — I10 Essential (primary) hypertension: Secondary | ICD-10-CM | POA: Diagnosis not present

## 2021-02-03 DIAGNOSIS — Z9884 Bariatric surgery status: Secondary | ICD-10-CM | POA: Diagnosis not present

## 2021-02-03 DIAGNOSIS — I509 Heart failure, unspecified: Secondary | ICD-10-CM | POA: Diagnosis not present

## 2021-02-03 DIAGNOSIS — I4892 Unspecified atrial flutter: Secondary | ICD-10-CM | POA: Diagnosis not present

## 2021-02-12 ENCOUNTER — Other Ambulatory Visit: Payer: Self-pay

## 2021-02-12 ENCOUNTER — Ambulatory Visit (INDEPENDENT_AMBULATORY_CARE_PROVIDER_SITE_OTHER): Payer: Medicaid Other | Admitting: *Deleted

## 2021-02-12 DIAGNOSIS — I480 Paroxysmal atrial fibrillation: Secondary | ICD-10-CM

## 2021-02-12 DIAGNOSIS — Z5181 Encounter for therapeutic drug level monitoring: Secondary | ICD-10-CM | POA: Diagnosis not present

## 2021-02-12 LAB — POCT INR: INR: 2.4 (ref 2.0–3.0)

## 2021-02-12 NOTE — Patient Instructions (Signed)
Use 5mg  tablet  S/P Gastric Bypass   Continue warfarin 1 tablet daily except 1/2 tablet on Wednesdays  Recheck in 4 wks

## 2021-03-17 ENCOUNTER — Ambulatory Visit (INDEPENDENT_AMBULATORY_CARE_PROVIDER_SITE_OTHER): Payer: Medicaid Other | Admitting: *Deleted

## 2021-03-17 DIAGNOSIS — I480 Paroxysmal atrial fibrillation: Secondary | ICD-10-CM | POA: Diagnosis not present

## 2021-03-17 DIAGNOSIS — Z5181 Encounter for therapeutic drug level monitoring: Secondary | ICD-10-CM | POA: Diagnosis not present

## 2021-03-17 LAB — POCT INR: INR: 1.2 — AB (ref 2.0–3.0)

## 2021-03-17 NOTE — Patient Instructions (Signed)
Use 5mg  tablet  S/P Gastric Bypass   Take 1 1/2 tablets tonight, 1 tablet tomorrow night then resume 1 tablet daily except 1/2 tablet on Wednesdays  Recheck in 2 wks

## 2021-03-24 ENCOUNTER — Ambulatory Visit (INDEPENDENT_AMBULATORY_CARE_PROVIDER_SITE_OTHER): Payer: Medicaid Other

## 2021-03-24 DIAGNOSIS — I428 Other cardiomyopathies: Secondary | ICD-10-CM

## 2021-03-24 LAB — CUP PACEART REMOTE DEVICE CHECK
Battery Remaining Longevity: 93 mo
Battery Remaining Percentage: 89 %
Battery Voltage: 3.01 V
Brady Statistic AP VP Percent: 1 %
Brady Statistic AP VS Percent: 8.3 %
Brady Statistic AS VP Percent: 1 %
Brady Statistic AS VS Percent: 91 %
Brady Statistic RA Percent Paced: 7.8 %
Brady Statistic RV Percent Paced: 1 %
Date Time Interrogation Session: 20221115010008
HighPow Impedance: 38 Ohm
Implantable Lead Implant Date: 20111010
Implantable Lead Implant Date: 20111010
Implantable Lead Location: 753859
Implantable Lead Location: 753860
Implantable Pulse Generator Implant Date: 20211115
Lead Channel Impedance Value: 400 Ohm
Lead Channel Impedance Value: 400 Ohm
Lead Channel Pacing Threshold Amplitude: 0.75 V
Lead Channel Pacing Threshold Amplitude: 1.25 V
Lead Channel Pacing Threshold Pulse Width: 0.5 ms
Lead Channel Pacing Threshold Pulse Width: 1 ms
Lead Channel Sensing Intrinsic Amplitude: 11.7 mV
Lead Channel Sensing Intrinsic Amplitude: 2.7 mV
Lead Channel Setting Pacing Amplitude: 2.5 V
Lead Channel Setting Pacing Amplitude: 2.5 V
Lead Channel Setting Pacing Pulse Width: 0.5 ms
Lead Channel Setting Sensing Sensitivity: 0.5 mV
Pulse Gen Serial Number: 111031056

## 2021-04-01 NOTE — Progress Notes (Signed)
Remote ICD transmission.   

## 2021-04-06 ENCOUNTER — Ambulatory Visit (INDEPENDENT_AMBULATORY_CARE_PROVIDER_SITE_OTHER): Payer: Medicaid Other | Admitting: *Deleted

## 2021-04-06 DIAGNOSIS — Z5181 Encounter for therapeutic drug level monitoring: Secondary | ICD-10-CM

## 2021-04-06 DIAGNOSIS — I480 Paroxysmal atrial fibrillation: Secondary | ICD-10-CM

## 2021-04-06 LAB — POCT INR: INR: 1.6 — AB (ref 2.0–3.0)

## 2021-04-06 NOTE — Patient Instructions (Signed)
Use 5mg  tablet  S/P Gastric Bypass   Increase warfarin to 1 tablet daily except 1 1/2 tablets on Mondays Recheck in 2 wks

## 2021-04-20 ENCOUNTER — Ambulatory Visit (INDEPENDENT_AMBULATORY_CARE_PROVIDER_SITE_OTHER): Payer: Medicaid Other | Admitting: *Deleted

## 2021-04-20 DIAGNOSIS — I480 Paroxysmal atrial fibrillation: Secondary | ICD-10-CM

## 2021-04-20 DIAGNOSIS — Z5181 Encounter for therapeutic drug level monitoring: Secondary | ICD-10-CM | POA: Diagnosis not present

## 2021-04-20 LAB — POCT INR: INR: 2.5 (ref 2.0–3.0)

## 2021-04-20 NOTE — Patient Instructions (Signed)
Continue 1 tablet daily except 1 1/2 tablets on Mondays Recheck in 4 wks

## 2021-05-01 DIAGNOSIS — G4733 Obstructive sleep apnea (adult) (pediatric): Secondary | ICD-10-CM | POA: Diagnosis not present

## 2021-05-05 ENCOUNTER — Ambulatory Visit: Payer: Medicaid Other

## 2021-05-07 ENCOUNTER — Other Ambulatory Visit: Payer: Self-pay | Admitting: *Deleted

## 2021-05-07 NOTE — Patient Outreach (Signed)
Care Coordination  05/07/2021  Danny E Oyola [birth year] [id number]   Medicaid Managed Care   Unsuccessful Outreach Note  05/07/2021 Name: Danny Hinton MRN: [id number] DOB: [birth year]  Referred by: Dettinger, Elige Radon, MD Reason for referral : High Risk Managed Medicaid (Unsuccessful RNCM initial outreach)   An unsuccessful telephone outreach was attempted today. The patient was referred to the case management team for assistance with care management and care coordination.   Follow Up Plan: A HIPAA compliant phone message was left for the patient providing contact information and requesting a return call. RNCM will refer to scheduling Care Guide to contact patient to reschedule this initial visit.  Estanislado Emms RN, BSN [place]   [employer]

## 2021-05-07 NOTE — Patient Instructions (Signed)
Visit Information  Mr. [name] E Kroeger  - as a part of your Medicaid benefit, you are eligible for care management and care coordination services at no cost or copay. I was unable to reach you by phone today but would be happy to help you with your health related needs. Please feel free to call me @ [phone].   A member of the Managed Medicaid care management team will reach out to you again over the next 14 days.   Estanislado Emms RN, BSN [place]   [employer]

## 2021-05-12 ENCOUNTER — Other Ambulatory Visit: Payer: Self-pay | Admitting: Family Medicine

## 2021-05-12 NOTE — Telephone Encounter (Signed)
Thirty day supply given. Patient must be seen for any further refills.  °

## 2021-05-18 ENCOUNTER — Ambulatory Visit (INDEPENDENT_AMBULATORY_CARE_PROVIDER_SITE_OTHER): Payer: Medicaid Other | Admitting: *Deleted

## 2021-05-18 ENCOUNTER — Other Ambulatory Visit: Payer: Self-pay | Admitting: Cardiovascular Disease

## 2021-05-18 DIAGNOSIS — Z5181 Encounter for therapeutic drug level monitoring: Secondary | ICD-10-CM

## 2021-05-18 DIAGNOSIS — I480 Paroxysmal atrial fibrillation: Secondary | ICD-10-CM | POA: Diagnosis not present

## 2021-05-18 LAB — POCT INR: INR: 2.6 (ref 2.0–3.0)

## 2021-05-18 NOTE — Patient Instructions (Signed)
Continue 1 tablet daily except 1 1/2 tablets on Mondays Recheck in 4 wks   

## 2021-05-18 NOTE — Telephone Encounter (Signed)
Appt made for 04/14/22

## 2021-05-19 ENCOUNTER — Other Ambulatory Visit: Payer: Self-pay | Admitting: Family Medicine

## 2021-06-04 ENCOUNTER — Encounter: Payer: Self-pay | Admitting: Family Medicine

## 2021-06-04 ENCOUNTER — Ambulatory Visit (INDEPENDENT_AMBULATORY_CARE_PROVIDER_SITE_OTHER): Payer: Medicaid Other | Admitting: Family Medicine

## 2021-06-04 VITALS — BP 91/53 | HR 61 | Ht 75.0 in | Wt 240.0 lb

## 2021-06-04 DIAGNOSIS — E785 Hyperlipidemia, unspecified: Secondary | ICD-10-CM | POA: Diagnosis not present

## 2021-06-04 DIAGNOSIS — I152 Hypertension secondary to endocrine disorders: Secondary | ICD-10-CM | POA: Diagnosis not present

## 2021-06-04 DIAGNOSIS — I5022 Chronic systolic (congestive) heart failure: Secondary | ICD-10-CM | POA: Diagnosis not present

## 2021-06-04 DIAGNOSIS — I471 Supraventricular tachycardia: Secondary | ICD-10-CM | POA: Diagnosis not present

## 2021-06-04 DIAGNOSIS — E1159 Type 2 diabetes mellitus with other circulatory complications: Secondary | ICD-10-CM | POA: Diagnosis not present

## 2021-06-04 DIAGNOSIS — E1169 Type 2 diabetes mellitus with other specified complication: Secondary | ICD-10-CM

## 2021-06-04 DIAGNOSIS — J329 Chronic sinusitis, unspecified: Secondary | ICD-10-CM

## 2021-06-04 LAB — BAYER DCA HB A1C WAIVED: HB A1C (BAYER DCA - WAIVED): 5.3 % (ref 4.8–5.6)

## 2021-06-04 MED ORDER — ATORVASTATIN CALCIUM 40 MG PO TABS: 40.0000 mg | ORAL_TABLET | Freq: Every day | ORAL | 3 refills | Status: DC

## 2021-06-04 MED ORDER — FUROSEMIDE 40 MG PO TABS: 40.0000 mg | ORAL_TABLET | Freq: Every day | ORAL | 3 refills | Status: DC

## 2021-06-04 MED ORDER — METOPROLOL TARTRATE 25 MG PO TABS: ORAL_TABLET | ORAL | 3 refills | Status: DC

## 2021-06-04 MED ORDER — FLUTICASONE PROPIONATE 50 MCG/ACT NA SUSP: 1.0000 | NASAL | 11 refills | Status: DC | PRN

## 2021-06-04 NOTE — Progress Notes (Signed)
BP (!) 91/53    Pulse 61    Ht '6\' 3"'  (1.905 m)    Wt 240 lb (108.9 kg)    SpO2 100%    BMI 30.00 kg/m    Subjective:   Patient ID: Danny Hinton, male    DOB: 1980/07/10, 41 y.o.   MRN: [id number]  HPI: Danny Hinton is a 41 y.o. male presenting on 06/04/2021 for Medical Management of Chronic Issues, Hyperlipidemia, and Hypertension   HPI Depression and anxiety Patient says he has a lot of anxiety still because he still working with disability and not getting any income and cannot work because of his congestive heart failure.  This is where a lot of his anxiety stems from and we did discuss this.  He says is not suicidal and at this point does not want to seek any treatment for it and he says he is managing it but he does have a lot of anxiety and feelings of depression and feeling down that he admits to.  He says is not bad enough that he wants treatment at this point, either counseling or medicine. Depression screen [facility] 2/9 06/04/2021 08/13/2020 05/15/2020 03/28/2019 10/12/2017  Decreased Interest '2 3 3 3 3  ' Down, Depressed, Hopeless '2 3 3 1 3  ' PHQ - 2 Score '4 6 6 4 6  ' Altered sleeping 2 - '3 3 3  ' Tired, decreased energy 2 - '3 3 3  ' Change in appetite 1 - '1 1 3  ' Feeling bad or failure about yourself  3 - '1 1 3  ' Trouble concentrating 3 - 0 0 3  Moving slowly or fidgety/restless 1 - 0 3 3  Suicidal thoughts 0 - 0 0 0  PHQ-9 Score 16 - '14 15 24  ' Difficult doing work/chores - - - Somewhat difficult -  Some recent data might be hidden    Type 2 diabetes mellitus Patient comes in today for recheck of his diabetes. Patient has been currently taking no medication. Patient is not currently on an ACE inhibitor/ARB. Patient has not seen an ophthalmologist this year. Patient denies any issues with their feet. The symptom started onset as an adult hypertension and hyperlipidemia ARE RELATED TO DM   Hypertension Patient is currently on carvedilol and furosemide and metoprolol and Entresto.  He still sees  cardiology for this, and their blood pressure today is 91/53. Patient does get some orthostatic lightheadedness and dizziness but is already discussed this in the past with his cardiology and they are monitoring and said to have him on the lowest doses of medicines that they can go to.. Patient denies headaches, blurred vision, chest pains, shortness of breath, or weakness. Denies any side effects from medication and is content with current medication.   Hyperlipidemia Patient is coming in for recheck of his hyperlipidemia. The patient is currently taking atorvastatin. They deny any issues with myalgias or history of liver damage from it. They deny any focal numbness or weakness or chest pain.   Relevant past medical, surgical, family and social history reviewed and updated as indicated. Interim medical history since our last visit reviewed. Allergies and medications reviewed and updated.  Review of Systems  Constitutional:  Negative for chills and fever.  Eyes:  Negative for visual disturbance.  Respiratory:  Negative for shortness of breath and wheezing.   Cardiovascular:  Negative for chest pain and leg swelling.  Musculoskeletal:  Negative for back pain and gait problem.  Skin:  Negative for rash.  Neurological:  Positive for light-headedness. Negative for weakness.  Psychiatric/Behavioral:  Negative for decreased concentration, self-injury, sleep disturbance and suicidal ideas. The patient is not nervous/anxious.   All other systems reviewed and are negative.  Per HPI unless specifically indicated above   Allergies as of 06/04/2021   No Known Allergies      Medication List        Accurate as of June 04, 2021  9:17 AM. If you have any questions, ask your nurse or doctor.          STOP taking these medications    BD Pen Needle Nano 2nd Gen 32G X 4 MM Misc Generic drug: Insulin Pen Needle Stopped by: Fransisca Kaufmann Helga Asbury, MD       TAKE these medications     atorvastatin 40 MG tablet Commonly known as: LIPITOR Take 1 tablet (40 mg total) by mouth daily. What changed: additional instructions Changed by: Fransisca Kaufmann Porchia Sinkler, MD   carvedilol 25 MG tablet Commonly known as: COREG TAKE 2 TABLETS BY MOUTH TWICE DAILY WITH A MEAL   Entresto 49-51 MG Generic drug: sacubitril-valsartan Take 1 tablet by mouth twice daily   EQ Allergy Relief 10 MG tablet Generic drug: loratadine Take 1 tablet by mouth once daily   fluticasone 50 MCG/ACT nasal spray Commonly known as: FLONASE Place 1 spray into both nostrils as needed.   furosemide 40 MG tablet Commonly known as: LASIX Take 1 tablet (40 mg total) by mouth daily.   loperamide 2 MG tablet Commonly known as: Imodium A-D Take as needed   metoprolol tartrate 25 MG tablet Commonly known as: LOPRESSOR Take 1 tablet as needed for palpitations   multivitamin with minerals Tabs tablet Take 1 tablet by mouth daily.   ondansetron 4 MG tablet Commonly known as: Zofran Take 1 tablet (4 mg total) by mouth every 8 (eight) hours as needed for nausea or vomiting.   Vitamin D (Ergocalciferol) 1.25 MG (50000 UNIT) Caps capsule Commonly known as: DRISDOL Take 1 capsule (50,000 Units total) by mouth every 7 (seven) days. What changed:  when to take this additional instructions   VSL#3 Caps Take twice a day   warfarin 1 MG tablet Commonly known as: COUMADIN Take as directed by the anticoagulation clinic. If you are unsure how to take this medication, talk to your nurse or doctor. Original instructions: Take 1 tablet (1 mg total) by mouth daily. Or as directed         Objective:   BP (!) 91/53    Pulse 61    Ht '6\' 3"'  (1.905 m)    Wt 240 lb (108.9 kg)    SpO2 100%    BMI 30.00 kg/m   Wt Readings from Last 3 Encounters:  06/04/21 240 lb (108.9 kg)  08/13/20 (!) 360 lb (163.3 kg)  08/08/20 (!) 359 lb (162.8 kg)    Physical Exam Vitals and nursing note reviewed.  Constitutional:       General: He is not in acute distress.    Appearance: He is well-developed. He is not diaphoretic.  Eyes:     General: No scleral icterus.    Conjunctiva/sclera: Conjunctivae normal.  Neck:     Thyroid: No thyromegaly.  Cardiovascular:     Rate and Rhythm: Normal rate and regular rhythm.     Heart sounds: Normal heart sounds. No murmur heard. Pulmonary:     Effort: Pulmonary effort is normal. No respiratory distress.     Breath sounds: Normal breath  sounds. No wheezing.  Musculoskeletal:        General: No swelling. Normal range of motion.     Cervical back: Neck supple.  Lymphadenopathy:     Cervical: No cervical adenopathy.  Skin:    General: Skin is warm and dry.     Findings: No rash.  Neurological:     Mental Status: He is alert and oriented to person, place, and time.     Coordination: Coordination normal.  Psychiatric:        Behavior: Behavior normal.      Assessment & Plan:   Problem List Items Addressed This Visit       Cardiovascular and Mediastinum   Chronic systolic congestive heart failure (HCC)   Relevant Medications   atorvastatin (LIPITOR) 40 MG tablet   furosemide (LASIX) 40 MG tablet   metoprolol tartrate (LOPRESSOR) 25 MG tablet   Hypertension associated with diabetes ([place]) - Primary   Relevant Medications   atorvastatin (LIPITOR) 40 MG tablet   furosemide (LASIX) 40 MG tablet   metoprolol tartrate (LOPRESSOR) 25 MG tablet   Other Relevant Orders   CMP14+EGFR     Endocrine   Diabetes mellitus ([place]), Rx Victoza   Relevant Medications   atorvastatin (LIPITOR) 40 MG tablet   Other Relevant Orders   Bayer DCA Hb A1c Waived   Dyslipidemia associated with type 2 diabetes mellitus (HCC), Rx Lipitor   Relevant Medications   atorvastatin (LIPITOR) 40 MG tablet   Other Relevant Orders   Lipid panel   Other Visit Diagnoses     Atrial tachycardia (HCC)       Relevant Medications   atorvastatin (LIPITOR) 40 MG tablet   furosemide (LASIX) 40 MG  tablet   metoprolol tartrate (LOPRESSOR) 25 MG tablet   Other Relevant Orders   CBC with Differential/Platelet   Chronic congestion of paranasal sinus       Relevant Medications   fluticasone (FLONASE) 50 MCG/ACT nasal spray       Continue current medicine, will do blood work today. Blood pressure down, follow-up with cardiology for this, would consider that they could discontinue some of the medicine such as a beta-blocker or reducing Entresto but will defer to cardiology for this because of his congestive heart failure.    Follow up plan: Return in about 6 months (around 12/02/2021), or if symptoms worsen or fail to improve, for Diabetes and CHF and hypertension.  Counseling provided for all of the vaccine components Orders Placed This Encounter  Procedures   CBC with Differential/Platelet   CMP14+EGFR   Lipid panel   Bayer DCA Hb A1c Waived    Caryl Pina, MD [facility] 06/04/2021, 9:17 AM

## 2021-06-05 LAB — CBC WITH DIFFERENTIAL/PLATELET
Basophils Absolute: 0 10*3/uL (ref 0.0–0.2)
Basos: 1 %
EOS (ABSOLUTE): 0.6 10*3/uL — ABNORMAL HIGH (ref 0.0–0.4)
Eos: 15 %
Hematocrit: 41.2 % (ref 37.5–51.0)
Hemoglobin: 13.2 g/dL (ref 13.0–17.7)
Immature Grans (Abs): 0 10*3/uL (ref 0.0–0.1)
Immature Granulocytes: 0 %
Lymphocytes Absolute: 1.5 10*3/uL (ref 0.7–3.1)
Lymphs: 36 %
MCH: 27.2 pg (ref 26.6–33.0)
MCHC: 32 g/dL (ref 31.5–35.7)
MCV: 85 fL (ref 79–97)
Monocytes Absolute: 0.4 10*3/uL (ref 0.1–0.9)
Monocytes: 9 %
Neutrophils Absolute: 1.7 10*3/uL (ref 1.4–7.0)
Neutrophils: 39 %
Platelets: 266 10*3/uL (ref 150–450)
RBC: 4.85 x10E6/uL (ref 4.14–5.80)
RDW: 13 % (ref 11.6–15.4)
WBC: 4.2 10*3/uL (ref 3.4–10.8)

## 2021-06-05 LAB — LIPID PANEL
Chol/HDL Ratio: 3.3 ratio (ref 0.0–5.0)
Cholesterol, Total: 122 mg/dL (ref 100–199)
HDL: 37 mg/dL — ABNORMAL LOW (ref >39)
LDL Chol Calc (NIH): 75 mg/dL (ref 0–99)
Triglycerides: 38 mg/dL (ref 0–149)
VLDL Cholesterol Cal: 10 mg/dL (ref 5–40)

## 2021-06-05 LAB — CMP14+EGFR
ALT: 20 IU/L (ref 0–44)
AST: 14 IU/L (ref 0–40)
Albumin/Globulin Ratio: 1.8 (ref 1.2–2.2)
Albumin: 4.4 g/dL (ref 4.0–5.0)
Alkaline Phosphatase: 75 IU/L (ref 44–121)
BUN/Creatinine Ratio: 15 (ref 9–20)
BUN: 11 mg/dL (ref 6–20)
Bilirubin Total: 1 mg/dL (ref 0.0–1.2)
CO2: 28 mmol/L (ref 20–29)
Calcium: 9.4 mg/dL (ref 8.7–10.2)
Chloride: 101 mmol/L (ref 96–106)
Creatinine, Ser: 0.72 mg/dL — ABNORMAL LOW (ref 0.76–1.27)
Globulin, Total: 2.4 g/dL (ref 1.5–4.5)
Glucose: 89 mg/dL (ref 70–99)
Potassium: 4.6 mmol/L (ref 3.5–5.2)
Sodium: 140 mmol/L (ref 134–144)
Total Protein: 6.8 g/dL (ref 6.0–8.5)
eGFR: 119 mL/min/{1.73_m2} (ref >59)

## 2021-06-23 ENCOUNTER — Ambulatory Visit (INDEPENDENT_AMBULATORY_CARE_PROVIDER_SITE_OTHER): Payer: Medicaid Other

## 2021-06-23 DIAGNOSIS — I428 Other cardiomyopathies: Secondary | ICD-10-CM

## 2021-06-23 LAB — CUP PACEART REMOTE DEVICE CHECK
Battery Remaining Longevity: 91 mo
Battery Remaining Percentage: 87 %
Battery Voltage: 3.01 V
Brady Statistic AP VP Percent: 1 %
Brady Statistic AP VS Percent: 8.8 %
Brady Statistic AS VP Percent: 1 %
Brady Statistic AS VS Percent: 91 %
Brady Statistic RA Percent Paced: 8.3 %
Brady Statistic RV Percent Paced: 1 %
Date Time Interrogation Session: 20230214020053
HighPow Impedance: 36 Ohm
Implantable Lead Implant Date: 20111010
Implantable Lead Implant Date: 20111010
Implantable Lead Location: 753859
Implantable Lead Location: 753860
Implantable Pulse Generator Implant Date: 20211115
Lead Channel Impedance Value: 390 Ohm
Lead Channel Impedance Value: 400 Ohm
Lead Channel Pacing Threshold Amplitude: 0.75 V
Lead Channel Pacing Threshold Amplitude: 1.25 V
Lead Channel Pacing Threshold Pulse Width: 0.5 ms
Lead Channel Pacing Threshold Pulse Width: 1 ms
Lead Channel Sensing Intrinsic Amplitude: 11.7 mV
Lead Channel Sensing Intrinsic Amplitude: 2.8 mV
Lead Channel Setting Pacing Amplitude: 2.5 V
Lead Channel Setting Pacing Amplitude: 2.5 V
Lead Channel Setting Pacing Pulse Width: 0.5 ms
Lead Channel Setting Sensing Sensitivity: 0.5 mV
Pulse Gen Serial Number: 111031056

## 2021-06-25 ENCOUNTER — Encounter: Payer: Self-pay | Admitting: Cardiovascular Disease

## 2021-06-29 NOTE — Progress Notes (Signed)
Remote ICD transmission.   

## 2021-07-16 ENCOUNTER — Other Ambulatory Visit: Payer: Self-pay

## 2021-07-16 ENCOUNTER — Ambulatory Visit (INDEPENDENT_AMBULATORY_CARE_PROVIDER_SITE_OTHER): Payer: Medicaid Other | Admitting: *Deleted

## 2021-07-16 DIAGNOSIS — Z5181 Encounter for therapeutic drug level monitoring: Secondary | ICD-10-CM | POA: Diagnosis not present

## 2021-07-16 DIAGNOSIS — I480 Paroxysmal atrial fibrillation: Secondary | ICD-10-CM | POA: Diagnosis not present

## 2021-07-16 LAB — POCT INR: INR: 2.3 (ref 2.0–3.0)

## 2021-07-16 NOTE — Patient Instructions (Signed)
Continue 1 tablet daily except 1 1/2 tablets on Mondays ?Recheck in 6 wks   ?

## 2021-07-20 DIAGNOSIS — Z9884 Bariatric surgery status: Secondary | ICD-10-CM | POA: Diagnosis not present

## 2021-07-20 DIAGNOSIS — Z713 Dietary counseling and surveillance: Secondary | ICD-10-CM | POA: Diagnosis not present

## 2021-07-20 DIAGNOSIS — E663 Overweight: Secondary | ICD-10-CM | POA: Diagnosis not present

## 2021-07-22 ENCOUNTER — Other Ambulatory Visit: Payer: Medicaid Other

## 2021-07-22 ENCOUNTER — Other Ambulatory Visit: Payer: Self-pay | Admitting: Family Medicine

## 2021-07-22 DIAGNOSIS — Z9884 Bariatric surgery status: Secondary | ICD-10-CM | POA: Diagnosis not present

## 2021-07-22 DIAGNOSIS — E876 Hypokalemia: Secondary | ICD-10-CM | POA: Diagnosis not present

## 2021-07-22 DIAGNOSIS — K912 Postsurgical malabsorption, not elsewhere classified: Secondary | ICD-10-CM | POA: Diagnosis not present

## 2021-07-26 ENCOUNTER — Other Ambulatory Visit: Payer: Self-pay | Admitting: Cardiovascular Disease

## 2021-07-26 DIAGNOSIS — I471 Supraventricular tachycardia: Secondary | ICD-10-CM

## 2021-07-26 DIAGNOSIS — I4719 Other supraventricular tachycardia: Secondary | ICD-10-CM

## 2021-07-27 ENCOUNTER — Telehealth: Payer: Self-pay | Admitting: Cardiovascular Disease

## 2021-07-27 ENCOUNTER — Other Ambulatory Visit: Payer: Self-pay | Admitting: Cardiovascular Disease

## 2021-07-27 ENCOUNTER — Encounter: Payer: Self-pay | Admitting: Cardiovascular Disease

## 2021-07-27 DIAGNOSIS — I471 Supraventricular tachycardia: Secondary | ICD-10-CM

## 2021-07-27 DIAGNOSIS — E1169 Type 2 diabetes mellitus with other specified complication: Secondary | ICD-10-CM

## 2021-07-27 DIAGNOSIS — I5022 Chronic systolic (congestive) heart failure: Secondary | ICD-10-CM

## 2021-07-27 MED ORDER — ENTRESTO 49-51 MG PO TABS: 1.0000 | ORAL_TABLET | Freq: Two times a day (BID) | ORAL | 0 refills | Status: DC

## 2021-07-27 MED ORDER — CARVEDILOL 25 MG PO TABS: ORAL_TABLET | ORAL | 0 refills | Status: DC

## 2021-07-27 MED ORDER — FUROSEMIDE 40 MG PO TABS: 40.0000 mg | ORAL_TABLET | Freq: Every day | ORAL | 0 refills | Status: DC

## 2021-07-27 MED ORDER — ATORVASTATIN CALCIUM 40 MG PO TABS: 40.0000 mg | ORAL_TABLET | Freq: Every day | ORAL | 0 refills | Status: DC

## 2021-07-27 MED ORDER — METOPROLOL TARTRATE 25 MG PO TABS: ORAL_TABLET | ORAL | 0 refills | Status: DC

## 2021-07-27 NOTE — Telephone Encounter (Signed)
?*  STAT* If patient is at the pharmacy, call can be transferred to refill team. ? ? ?1. Which medications need to be refilled? (please list name of each medication and dose if known)  ? carvedilol (COREG) 25 MG tablet  ? ? ?2. Which pharmacy/location (including street and city if local pharmacy) is medication to be sent to? [facility] [address] [place] - [address] ? ?3. Do they need a 30 day or 90 day supply? 90 day ? ?Pt is completely out of the med and needs refilled today ?

## 2021-07-29 MED ORDER — CARVEDILOL 25 MG PO TABS: ORAL_TABLET | ORAL | 0 refills | Status: DC

## 2021-07-29 NOTE — Telephone Encounter (Signed)
Medication has been filled, LVM for patient with this information ?

## 2021-08-04 DIAGNOSIS — I4892 Unspecified atrial flutter: Secondary | ICD-10-CM | POA: Diagnosis not present

## 2021-08-04 DIAGNOSIS — I509 Heart failure, unspecified: Secondary | ICD-10-CM | POA: Diagnosis not present

## 2021-08-04 DIAGNOSIS — E669 Obesity, unspecified: Secondary | ICD-10-CM | POA: Diagnosis not present

## 2021-08-04 DIAGNOSIS — E1169 Type 2 diabetes mellitus with other specified complication: Secondary | ICD-10-CM | POA: Diagnosis not present

## 2021-08-04 DIAGNOSIS — Z95810 Presence of automatic (implantable) cardiac defibrillator: Secondary | ICD-10-CM | POA: Diagnosis not present

## 2021-08-04 DIAGNOSIS — I4891 Unspecified atrial fibrillation: Secondary | ICD-10-CM | POA: Diagnosis not present

## 2021-08-04 DIAGNOSIS — E782 Mixed hyperlipidemia: Secondary | ICD-10-CM | POA: Diagnosis not present

## 2021-08-04 DIAGNOSIS — G4733 Obstructive sleep apnea (adult) (pediatric): Secondary | ICD-10-CM | POA: Diagnosis not present

## 2021-08-04 DIAGNOSIS — Z9884 Bariatric surgery status: Secondary | ICD-10-CM | POA: Diagnosis not present

## 2021-08-09 ENCOUNTER — Other Ambulatory Visit: Payer: Self-pay | Admitting: Family Medicine

## 2021-08-09 DIAGNOSIS — Z7901 Long term (current) use of anticoagulants: Secondary | ICD-10-CM

## 2021-08-09 DIAGNOSIS — I480 Paroxysmal atrial fibrillation: Secondary | ICD-10-CM

## 2021-08-11 ENCOUNTER — Other Ambulatory Visit: Payer: Self-pay | Admitting: Family Medicine

## 2021-08-11 DIAGNOSIS — I480 Paroxysmal atrial fibrillation: Secondary | ICD-10-CM

## 2021-08-11 DIAGNOSIS — Z7901 Long term (current) use of anticoagulants: Secondary | ICD-10-CM

## 2021-08-12 ENCOUNTER — Other Ambulatory Visit: Payer: Self-pay | Admitting: Cardiovascular Disease

## 2021-08-17 ENCOUNTER — Telehealth: Payer: Self-pay | Admitting: *Deleted

## 2021-08-17 ENCOUNTER — Encounter: Payer: Self-pay | Admitting: Cardiovascular Disease

## 2021-08-17 DIAGNOSIS — I471 Supraventricular tachycardia: Secondary | ICD-10-CM

## 2021-08-17 DIAGNOSIS — I4719 Other supraventricular tachycardia: Secondary | ICD-10-CM

## 2021-08-17 MED ORDER — CARVEDILOL 25 MG PO TABS: 37.5000 mg | ORAL_TABLET | Freq: Two times a day (BID) | ORAL | 1 refills | Status: DC

## 2021-08-17 NOTE — Telephone Encounter (Signed)
Please reduce the carvedilol to 37.5 mg (1.5 tabs of 25 mg) twice daily. Has he lost more weight? ?

## 2021-08-17 NOTE — Telephone Encounter (Signed)
Called the patient concerning his MyChart message: ? ?Hello I know my blood pressure usually run low today its 87 over 47. Is that ok ? ?He currently takes: ?Carvedilol 50 mg bid ?Entresto 49-51 mg bid ?Furosemide 40 mg once daily ? ?While on the phone it was 91/50 HR 64. He did say that this was normal for him but he was symptomatic with feelings of lightheadedness and fatigue.  ?

## 2021-08-17 NOTE — Telephone Encounter (Signed)
Returned the call to the patient. He has been made aware to reduce the Carvedilol. A new script has been sent in.  ? ?He stated that he has not lost anymore weight lately.  ? ?He will keep [place] updated by MyChart. ?

## 2021-08-27 ENCOUNTER — Ambulatory Visit (INDEPENDENT_AMBULATORY_CARE_PROVIDER_SITE_OTHER): Payer: Medicaid Other | Admitting: *Deleted

## 2021-08-27 DIAGNOSIS — Z5181 Encounter for therapeutic drug level monitoring: Secondary | ICD-10-CM

## 2021-08-27 DIAGNOSIS — I480 Paroxysmal atrial fibrillation: Secondary | ICD-10-CM | POA: Diagnosis not present

## 2021-08-27 LAB — POCT INR: INR: 1.2 — AB (ref 2.0–3.0)

## 2021-08-27 NOTE — Patient Instructions (Signed)
Take warfarin 2 tablets tonight and tomorrow night then resume 1 tablet daily except 1 1/2 tablets on Mondays ?Recheck in 1 wk ?

## 2021-09-03 ENCOUNTER — Ambulatory Visit (INDEPENDENT_AMBULATORY_CARE_PROVIDER_SITE_OTHER): Payer: Medicaid Other | Admitting: *Deleted

## 2021-09-03 DIAGNOSIS — Z5181 Encounter for therapeutic drug level monitoring: Secondary | ICD-10-CM | POA: Diagnosis not present

## 2021-09-03 DIAGNOSIS — I480 Paroxysmal atrial fibrillation: Secondary | ICD-10-CM | POA: Diagnosis not present

## 2021-09-03 LAB — POCT INR: INR: 1.2 — AB (ref 2.0–3.0)

## 2021-09-03 NOTE — Patient Instructions (Signed)
Pending possible dental extraction (2) on Monday 09/07/21. ?Take warfarin 2 tablets tonight and tomorrow night then hold for possible extractions on Monday.  Will call Monday and let me know if he had extractions and get further dosing instructions based on above. ?

## 2021-09-09 ENCOUNTER — Telehealth: Payer: Self-pay | Admitting: Cardiovascular Disease

## 2021-09-09 NOTE — Telephone Encounter (Signed)
Pt states that he would like to speak with Misty Stanley regarding dental appt. Please advise ?

## 2021-09-09 NOTE — Telephone Encounter (Signed)
Called pt back  but was not able to get in touch with him. LMOM to call office back.  ?

## 2021-09-09 NOTE — Telephone Encounter (Signed)
Called and spoke to pt who stated that he did not get his teeth extracted and that he resumed his warfarin on 09/08/2021.  Pt stated he is to go back to the dentist on 09/21/2021. Pt stated he took 10mg  of warfarin last night instead of 5 mg. Instructed pt to continue his normal dose of warfarin: 5mg  daily except 7.5mg  on Mondays. Made pt an appointment to have INR checked on 09/14/2021.  ? ? ?

## 2021-09-14 ENCOUNTER — Ambulatory Visit (INDEPENDENT_AMBULATORY_CARE_PROVIDER_SITE_OTHER): Payer: Medicaid Other | Admitting: Cardiovascular Disease

## 2021-09-14 ENCOUNTER — Encounter: Payer: Self-pay | Admitting: Cardiovascular Disease

## 2021-09-14 VITALS — BP 92/58 | Ht 76.0 in | Wt 238.0 lb

## 2021-09-14 DIAGNOSIS — I712 Thoracic aortic aneurysm, without rupture, unspecified: Secondary | ICD-10-CM

## 2021-09-14 DIAGNOSIS — D6869 Other thrombophilia: Secondary | ICD-10-CM

## 2021-09-14 DIAGNOSIS — I5042 Chronic combined systolic (congestive) and diastolic (congestive) heart failure: Secondary | ICD-10-CM | POA: Diagnosis not present

## 2021-09-14 DIAGNOSIS — I471 Supraventricular tachycardia: Secondary | ICD-10-CM

## 2021-09-14 DIAGNOSIS — I428 Other cardiomyopathies: Secondary | ICD-10-CM | POA: Diagnosis not present

## 2021-09-14 DIAGNOSIS — E663 Overweight: Secondary | ICD-10-CM

## 2021-09-14 DIAGNOSIS — E1169 Type 2 diabetes mellitus with other specified complication: Secondary | ICD-10-CM

## 2021-09-14 DIAGNOSIS — G4733 Obstructive sleep apnea (adult) (pediatric): Secondary | ICD-10-CM

## 2021-09-14 DIAGNOSIS — E669 Obesity, unspecified: Secondary | ICD-10-CM

## 2021-09-14 DIAGNOSIS — I4719 Other supraventricular tachycardia: Secondary | ICD-10-CM

## 2021-09-14 DIAGNOSIS — I480 Paroxysmal atrial fibrillation: Secondary | ICD-10-CM

## 2021-09-14 DIAGNOSIS — E785 Hyperlipidemia, unspecified: Secondary | ICD-10-CM

## 2021-09-14 DIAGNOSIS — I10 Essential (primary) hypertension: Secondary | ICD-10-CM

## 2021-09-14 DIAGNOSIS — Z95810 Presence of automatic (implantable) cardiac defibrillator: Secondary | ICD-10-CM

## 2021-09-14 MED ORDER — CARVEDILOL 25 MG PO TABS: 25.0000 mg | ORAL_TABLET | Freq: Two times a day (BID) | ORAL | 1 refills | Status: DC

## 2021-09-14 NOTE — Progress Notes (Signed)
Patient ID: Danny Hinton, male   DOB: [birth year], 41 y.o.   MRN: [id number] ?  ? ?Cardiology Office Note   ? ?Date:  09/20/2021  ? ?ID:  Danny E Schwering, DOB [birth year], MRN [id number] ? ?PCP:  Dettinger, Fransisca Kaufmann, MD  ?Cardiologist:   Sanda Klein, MD  ? ?Chief Complaint  ?Patient presents with  ? ICD check  ? ? ?History of Present Illness:  ?Danny E Garr is a 41 y.o. male who presents for nonischemic cardiomyopathy, chronic systolic heart failure, defibrillator checkup, history of atrial flutter, atrial fibrillation and paroxysmal atrial tachycardia (which has been sustained and led to heart failure decompensation).  He underwent uncomplicated defibrillator generator change out on March 24, 2020 (his new generator is a Aeronautical engineer). ? ?After successful gastric bypass surgery (Roux-en-Y, March 2022) he has lost 220 pounds, almost 50% of his initial weight of 460 pounds.  He has developed some fatigue recently which in his blood pressure is often slightly low in the low 90s/50s.  He is still taking a very high dose of carvedilol, 37.5 mg twice daily.  He has not been troubled by palpitations. ? ?Device interrogation shows normal function of the generator and leads.  He only has 9% atrial pacing and less than 1% ventricular pacing.  The burden of mode switch is much lower at less than 1%.  He did have a 2-hour episode of sustained atrial tachycardia in December.  The only recent event was a 3-minute and 46-second episode of sustained atrial tachycardia 173 bpm that occurred on March 21.  There have been no episodes of ventricular tachycardia. ? ?He has not had syncope and denies lower extreme edema, orthopnea or PND.  He does continue to have shortness of breath, NYHA functional class II-3. ? ?His most recent echocardiogram was performed in September 2019 and showed LVEF 40% ? ?In September 2015 he underwent cavotricuspid isthmus ablation for atrial flutter. On 08/19/2014, while [place] hunting he develop persistent  rapid palpitations and felt unwell. He underwent successful overdrive pacing via his device by Dr. Thompson Grayer. The rhythm was atypical atrial flutter, cycle length roughly 390 ms. The episode lasted for about 3 hours until he was successfully overdrive paced. In the past he has had paroxysmal atrial fibrillation. The decision was made to ablate his flutter secondary to the occurrence of multiple unnecessary defibrillator shocks in the setting of atrial flutter with rapid ventricular rate. ? ? ?Past Medical History:  ?Diagnosis Date  ? Anxiety   ? Atypical atrial flutter ([place]) 08/19/2014  ? Pace terminated in device clinic.  AFL cycle length was 390 msec.  ? Automatic implantable cardioverter-defibrillator in situ   ? a. St Jude in 2011.  ? B12 nutritional deficiency, B12 = 214 (03/18/19) 04/29/2019  ? Chronic anticoagulation with Coumadin   ? Chronic systolic congestive heart failure ([place])   ? a. suspected NICM - dx 2010. EF 15% by TEE, 10-20% by echo at that time. b. s/p St. Jude AICD 2011. c. Echo 01/2010: mod dilated LV, EF 30%, mod aortic root dilitation, no significant valvular disease.  ? Depression   ? Diabetes mellitus ([place]), Rx Victoza 04/29/2019  ? Dyslipidemia associated with type 2 diabetes mellitus ([place]), Rx Lipitor 04/29/2019  ? Eczema   ? Enlarged aorta ([facility])   ? Hypertension associated with diabetes ([place])   ? Mobitz type 2 second degree atrioventricular block   ? a. During sleeping hours in 2010 suspected due to OSA.  ? Morbid  obesity ([place])   ? Nonischemic cardiomyopathy ([place])   ? OSA treated with BiPAP 09/04/2018  ? Severe obstructive sleep apnea with an AHI of 67.2/h and nocturnal hypoxemia with oxygen saturations as low as 82%. Now on BIPAP at 18/14 cm H2O.  ? PAF (paroxysmal atrial fibrillation) ([place])   ? Palpitations   ? Paroxysmal atrial flutter ([place]), s/p ablation   ? Seasonal allergies   ? Sleep apnea   ? Thoracic aortic aneurysm ([place]), monitored annually 04/29/2019  ? Vitamin D deficiency,  vitamin D = 6.9 (03/28/19) 04/29/2019  ? ? ?Past Surgical History:  ?Procedure Laterality Date  ? ATRIAL FLUTTER ABLATION N/A 01/18/2014  ? Procedure: ATRIAL FLUTTER ABLATION;  Surgeon: Deboraha Sprang, MD;  Location: [facility] CATH [facility];  Service: Cardiovascular;  Laterality: N/A;  ? CARDIAC DEFIBRILLATOR PLACEMENT  02/17/10  ? St. Jude Medical 45DR, model number [id number], serial number [id number]  ? CARDIOVERSION N/A 12/29/2013  ? Procedure: CARDIOVERSION;  Surgeon: Deboraha Sprang, MD;  Location: [facility];  Service: Cardiovascular;  Laterality: N/A;  ? ICD GENERATOR CHANGEOUT N/A 03/24/2020  ? Procedure: ICD GENERATOR CHANGEOUT;  Surgeon: Sanda Klein, MD;  Location: [facility];  Service: Cardiovascular;  Laterality: N/A;  ? TOOTH EXTRACTION N/A 10/23/2012  ? Procedure: EXTRACTION TEETH 1, 16, 17, 30, 31;  Surgeon: Gae Bon, DDS;  Location: [facility];  Service: Oral Surgery;  Laterality: N/A;  ? ? ?Outpatient Medications Prior to Visit  ?Medication Sig Dispense Refill  ? atorvastatin (LIPITOR) 40 MG tablet Take 1 tablet (40 mg total) by mouth daily. 90 tablet 0  ? EQ ALLERGY RELIEF 10 MG tablet Take 1 tablet by mouth once daily 30 tablet 0  ? FLUoxetine (PROZAC) 20 MG tablet Take 20 mg by mouth daily.    ? furosemide (LASIX) 40 MG tablet Take 40 mg by mouth as needed.    ? loratadine (CLARITIN) 10 MG tablet Take 10 mg by mouth daily in the afternoon.    ? Multiple Vitamin (MULTIVITAMIN WITH MINERALS) TABS tablet Take 1 tablet by mouth daily.    ? Probiotic Product (VSL#3) CAPS Take twice a day    ? sacubitril-valsartan (ENTRESTO) 49-51 MG Take 1 tablet by mouth 2 (two) times daily. 180 tablet 0  ? Vitamin D, Ergocalciferol, (DRISDOL) 1.25 MG (50000 UNIT) CAPS capsule Take 1 capsule (50,000 Units total) by mouth every 7 (seven) days. (Patient taking differently: Take 50,000 Units by mouth 2 (two) times a week. Tues and Thurs) 12 capsule 0  ? warfarin (COUMADIN) 1 MG tablet TAKE 1 TABLET BY MOUTH ONCE DAILY AS DIRECTED  45 tablet 0  ? carvedilol (COREG) 25 MG tablet Take 1.5 tablets (37.5 mg total) by mouth 2 (two) times daily with a meal. 90 tablet 1  ? furosemide (LASIX) 40 MG tablet Take 1 tablet (40 mg total) by mouth daily. 90 tablet 0  ? fluticasone (FLONASE) 50 MCG/ACT nasal spray Place 1 spray into both nostrils as needed. (Patient not taking: Reported on 09/14/2021) 16 g 11  ? loperamide (IMODIUM A-D) 2 MG tablet Take as needed (Patient not taking: Reported on 09/14/2021) 30 tablet 0  ? metoprolol tartrate (LOPRESSOR) 25 MG tablet Take 1 tablet as needed for palpitations (Patient not taking: Reported on 09/14/2021) 90 tablet 0  ? ondansetron (ZOFRAN) 4 MG tablet Take 1 tablet (4 mg total) by mouth every 8 (eight) hours as needed for nausea or vomiting. (Patient not taking: Reported on 09/14/2021) 20 tablet 0  ? ?  No facility-administered medications prior to visit.  ?  ? ?Allergies:   Patient has no known allergies.  ? ?Social History  ? ?Socioeconomic History  ? Marital status: Married  ?  Spouse name: Not on file  ? Number of children: 1  ? Years of education: Not on file  ? Highest education level: Not on file  ?Occupational History  ? Occupation: disabled  ?Tobacco Use  ? Smoking status: Never  ? Smokeless tobacco: Never  ?Vaping Use  ? Vaping Use: Never used  ?Substance and Sexual Activity  ? Alcohol use: No  ? Drug use: No  ? Sexual activity: Yes  ?Other Topics Concern  ? Not on file  ?Social History Narrative  ? Not on file  ? ?Social Determinants of Health  ? ?Financial Resource Strain: Not on file  ?Food Insecurity: Not on file  ?Transportation Needs: Not on file  ?Physical Activity: Not on file  ?Stress: Not on file  ?Social Connections: Not on file  ?  ? ?Family History:  The patient's family history includes Anxiety disorder in his mother; Arrhythmia in his mother; Colon polyps in his father; Depression in his mother; Diabetes in his paternal grandmother; Heart disease in his maternal grandfather and mother; Hodgkin's  lymphoma (age of onset: 70) in his brother; Irritable bowel syndrome in his maternal aunt.  ? ?ROS:   ?Please see the history of present illness.    ?ROS ?All other systems are reviewed and are negative.

## 2021-09-14 NOTE — Patient Instructions (Signed)
Medication Instructions:  ?DECREASE the Carvedilol to 25 mg twice daily ? ?*If you need a refill on your cardiac medications before your next appointment, please call your pharmacy* ? ? ?Lab Work: ?Your provider would like for you to have the following labs today: BMET ? ?If you have labs (blood work) drawn today and your tests are completely normal, you will receive your results only by: ?MyChart Message (if you have MyChart) OR ?A paper copy in the mail ?If you have any lab test that is abnormal or we need to change your treatment, we will call you to review the results. ? ? ?Testing/Procedures: ?Dr. Royann Shivers has ordered a CT Angio Chest Aorta. This will be done at [facility]. ? ? ? ?Follow-Up: ?At [facility], you and your health needs are our priority.  As part of our continuing mission to provide you with exceptional heart care, we have created designated Provider Care Teams.  These Care Teams include your primary Cardiologist (physician) and Advanced Practice Providers (APPs -  Physician Assistants and Nurse Practitioners) who all work together to provide you with the care you need, when you need it. ? ?We recommend signing up for the patient portal called "MyChart".  Sign up information is provided on this After Visit Summary.  MyChart is used to connect with patients for Virtual Visits (Telemedicine).  Patients are able to view lab/test results, encounter notes, upcoming appointments, etc.  Non-urgent messages can be sent to your provider as well.   ?To learn more about what you can do with MyChart, go to [email or web address].   ? ?Your next appointment:   ?3 month(s) on a device day ? ?The format for your next appointment:   ?In Person ? ?Provider:   ?Thurmon Fair, MD { ? ? ?Important Information About Sugar ? ? ? ? ? ? ?

## 2021-09-15 ENCOUNTER — Ambulatory Visit (INDEPENDENT_AMBULATORY_CARE_PROVIDER_SITE_OTHER): Payer: Medicaid Other | Admitting: *Deleted

## 2021-09-15 DIAGNOSIS — I480 Paroxysmal atrial fibrillation: Secondary | ICD-10-CM

## 2021-09-15 DIAGNOSIS — Z5181 Encounter for therapeutic drug level monitoring: Secondary | ICD-10-CM | POA: Diagnosis not present

## 2021-09-15 LAB — BASIC METABOLIC PANEL
BUN/Creatinine Ratio: 12 (ref 9–20)
BUN: 9 mg/dL (ref 6–24)
CO2: 24 mmol/L (ref 20–29)
Calcium: 9.3 mg/dL (ref 8.7–10.2)
Chloride: 102 mmol/L (ref 96–106)
Creatinine, Ser: 0.74 mg/dL — ABNORMAL LOW (ref 0.76–1.27)
Glucose: 83 mg/dL (ref 70–99)
Potassium: 3.8 mmol/L (ref 3.5–5.2)
Sodium: 140 mmol/L (ref 134–144)
eGFR: 117 mL/min/{1.73_m2} (ref >59)

## 2021-09-15 LAB — POCT INR: INR: 1.2 — AB (ref 2.0–3.0)

## 2021-09-15 NOTE — Patient Instructions (Signed)
Pending dental extraction (2) on Monday 09/21/21. ?Take warfarin 2 tablets x 3 days then hold 3 days for extractions on Monday.  Restart warfarin night of procedure increasing dose to 1 1/2 tablets daily except 1 tablet on Sundays and Thursdays. ?Recheck 1 wk after procedure. ?

## 2021-09-17 ENCOUNTER — Telehealth: Payer: Self-pay | Admitting: General Practice

## 2021-09-17 NOTE — Telephone Encounter (Signed)
New Message: ? ? ?Patient wants to know if it will be alright to have his tooth extracted tomorrow?un ?

## 2021-09-17 NOTE — Telephone Encounter (Signed)
Spoke with pt.  Oral surgeon called pt and they have a cancellation today for extraction rather than Monday.  Pt wants to know if it's OK.  INR was 1.2 on 5/9.  He has only had 2 days of warfarin.  Told pt OK to proceed with extraction. ?

## 2021-09-20 ENCOUNTER — Encounter: Payer: Self-pay | Admitting: Cardiovascular Disease

## 2021-09-22 ENCOUNTER — Other Ambulatory Visit: Payer: Self-pay | Admitting: Cardiovascular Disease

## 2021-09-22 ENCOUNTER — Other Ambulatory Visit: Payer: Self-pay | Admitting: Family Medicine

## 2021-09-22 ENCOUNTER — Encounter: Payer: Self-pay | Admitting: Cardiovascular Disease

## 2021-09-22 ENCOUNTER — Ambulatory Visit (INDEPENDENT_AMBULATORY_CARE_PROVIDER_SITE_OTHER): Payer: Medicaid Other

## 2021-09-22 DIAGNOSIS — I471 Supraventricular tachycardia: Secondary | ICD-10-CM

## 2021-09-22 DIAGNOSIS — I428 Other cardiomyopathies: Secondary | ICD-10-CM | POA: Diagnosis not present

## 2021-09-22 DIAGNOSIS — I480 Paroxysmal atrial fibrillation: Secondary | ICD-10-CM

## 2021-09-22 DIAGNOSIS — Z7901 Long term (current) use of anticoagulants: Secondary | ICD-10-CM

## 2021-09-22 LAB — CUP PACEART REMOTE DEVICE CHECK
Battery Remaining Longevity: 88 mo
Battery Remaining Percentage: 84 %
Battery Voltage: 2.99 V
Brady Statistic AP VP Percent: 1 %
Brady Statistic AP VS Percent: 9.3 %
Brady Statistic AS VP Percent: 1 %
Brady Statistic AS VS Percent: 90 %
Brady Statistic RA Percent Paced: 8.8 %
Brady Statistic RV Percent Paced: 1 %
Date Time Interrogation Session: 20230516030340
HighPow Impedance: 37 Ohm
Implantable Lead Implant Date: 20111010
Implantable Lead Implant Date: 20111010
Implantable Lead Location: 753859
Implantable Lead Location: 753860
Implantable Pulse Generator Implant Date: 20211115
Lead Channel Impedance Value: 400 Ohm
Lead Channel Impedance Value: 400 Ohm
Lead Channel Pacing Threshold Amplitude: 0.75 V
Lead Channel Pacing Threshold Amplitude: 1.25 V
Lead Channel Pacing Threshold Pulse Width: 0.5 ms
Lead Channel Pacing Threshold Pulse Width: 1 ms
Lead Channel Sensing Intrinsic Amplitude: 11.7 mV
Lead Channel Sensing Intrinsic Amplitude: 2.5 mV
Lead Channel Setting Pacing Amplitude: 2.5 V
Lead Channel Setting Pacing Amplitude: 2.5 V
Lead Channel Setting Pacing Pulse Width: 0.5 ms
Lead Channel Setting Sensing Sensitivity: 0.5 mV
Pulse Gen Serial Number: 111031056

## 2021-09-24 NOTE — Telephone Encounter (Signed)
Please reduce the carvedilol to 12.5 mg twice daily

## 2021-09-24 NOTE — Telephone Encounter (Signed)
Called pt he states that his BP has been running 90-112/62-68 lately. He states that he has been dizzy, fatigued and congested. But this may be from the pollen. He states that he will decrease the carvedilol to 12.5 mg twice daily. He will continue to take BP at least 1 hour after taking his medication and send [place] results next wee/Monday for Dr C to review. He will call back with any additional sx/or any problems. Verbalized understanding.

## 2021-09-25 MED ORDER — CARVEDILOL 25 MG PO TABS: ORAL_TABLET | ORAL | 11 refills | Status: DC

## 2021-09-25 NOTE — Addendum Note (Signed)
Addended by: Sandi Mariscal on: 09/25/2021 08:17 AM   Modules accepted: Orders

## 2021-09-29 ENCOUNTER — Ambulatory Visit (INDEPENDENT_AMBULATORY_CARE_PROVIDER_SITE_OTHER): Payer: Medicaid Other | Admitting: *Deleted

## 2021-09-29 DIAGNOSIS — I480 Paroxysmal atrial fibrillation: Secondary | ICD-10-CM

## 2021-09-29 DIAGNOSIS — Z5181 Encounter for therapeutic drug level monitoring: Secondary | ICD-10-CM

## 2021-09-29 LAB — POCT INR: INR: 2 (ref 2.0–3.0)

## 2021-09-29 NOTE — Patient Instructions (Signed)
Continue warfarin 1 1/2 tablets daily except 1 tablet on Sundays and Thursdays. Recheck 3 wks

## 2021-10-08 ENCOUNTER — Other Ambulatory Visit: Payer: Self-pay | Admitting: *Deleted

## 2021-10-08 ENCOUNTER — Ambulatory Visit
Admission: RE | Admit: 2021-10-08 | Discharge: 2021-10-08 | Disposition: A | Discharge status: Home / Self Care | Payer: Medicaid Other | Source: Ambulatory Visit | Attending: Cardiovascular Disease | Admitting: Cardiovascular Disease

## 2021-10-08 DIAGNOSIS — I712 Thoracic aortic aneurysm, without rupture, unspecified: Secondary | ICD-10-CM

## 2021-10-08 DIAGNOSIS — I7121 Aneurysm of the ascending aorta, without rupture: Secondary | ICD-10-CM

## 2021-10-08 IMAGING — CT CT ANGIO CHEST
1 of 3 series · 12 of 32 positions shown · IV contrast (APPLIED)
Comparison: Chest XR, 12/17/2013.  CTA chest, 09/17/2020.

CLINICAL DATA: TRAY/Ylber Sh Mojallia Hx of CHF, ablation for AFib pacemaker/defib

EXAM:
CT ANGIOGRAPHY CHEST WITH CONTRAST
TECHNIQUE: Multidetector CT imaging of the chest was performed using the
standard protocol during bolus administration of intravenous
contrast. Multiplanar CT image reconstructions and MIPs were
obtained to evaluate the vascular anatomy.

[Series 9: thins · axial · 0.78mm/px · z∈[+862,+1138]mm · 12 of 409 slices shown]
[im 32/409  lung]
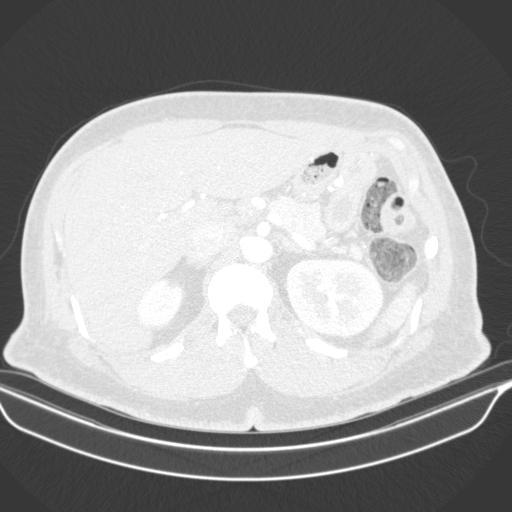
[im 63/409  soft-tissue]
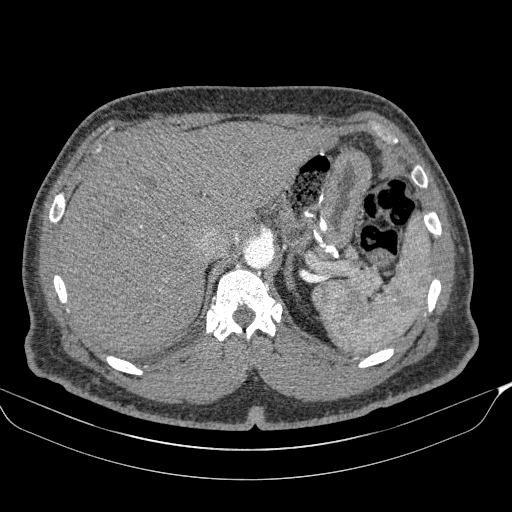
[im 95/409  lung]
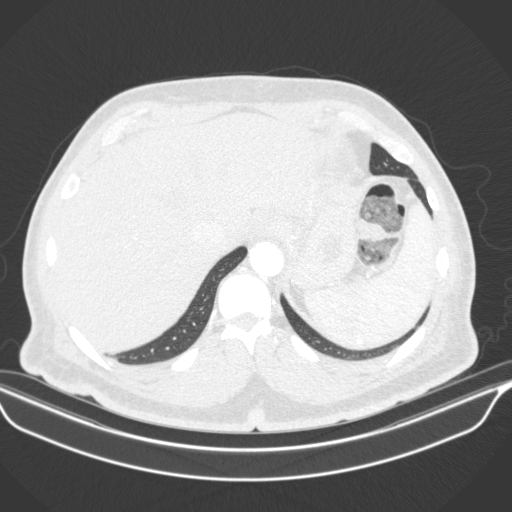
[im 126/409  soft-tissue]
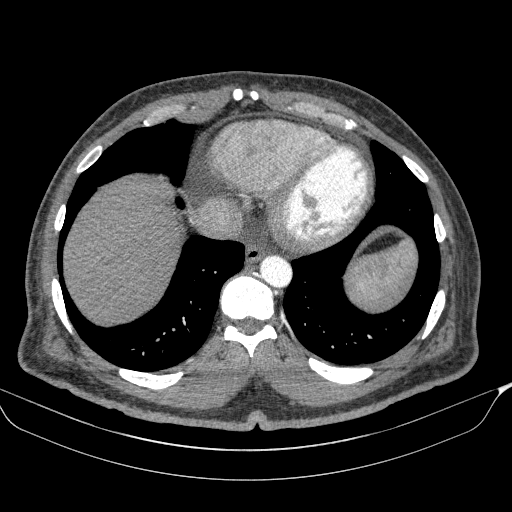
[im 157/409  lung]
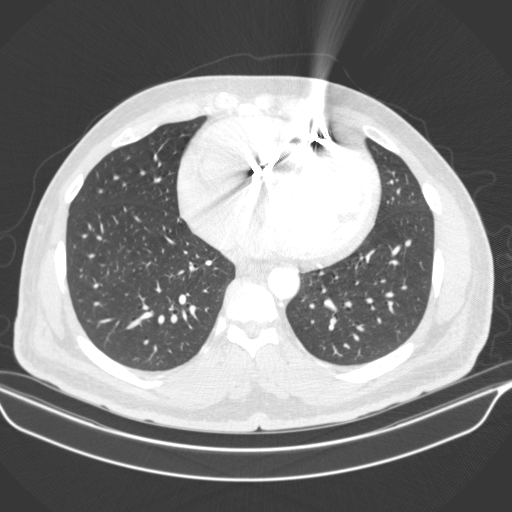
[im 189/409  soft-tissue]
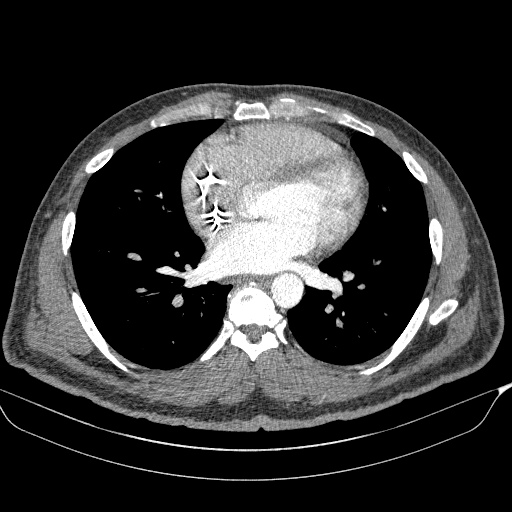
[im 220/409  lung]
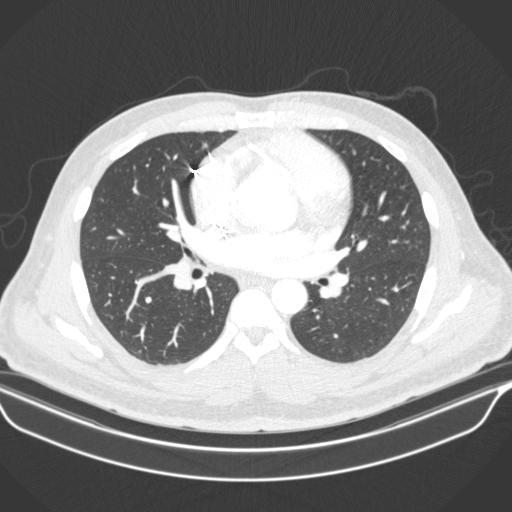
[im 252/409  soft-tissue]
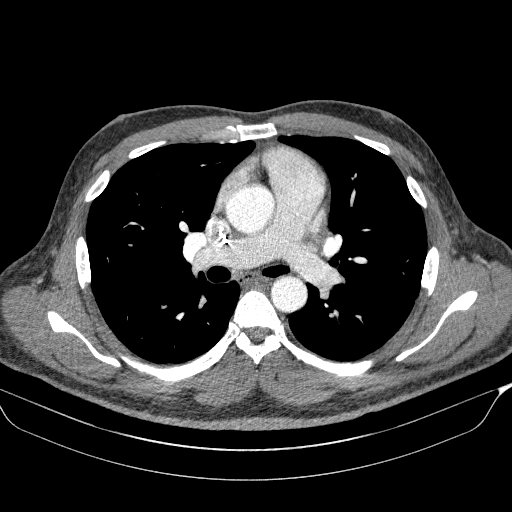
[im 283/409  lung]
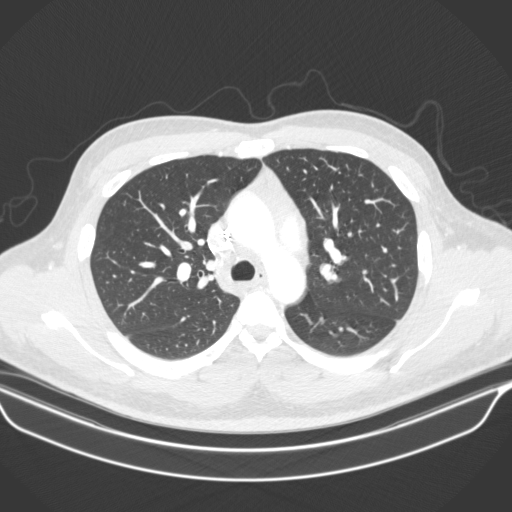
[im 314/409  soft-tissue]
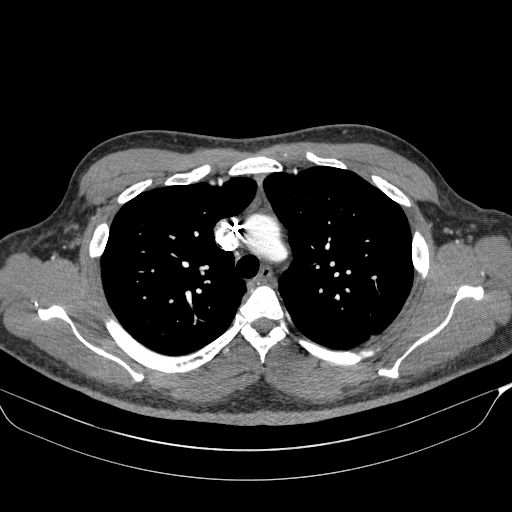
[im 346/409  lung]
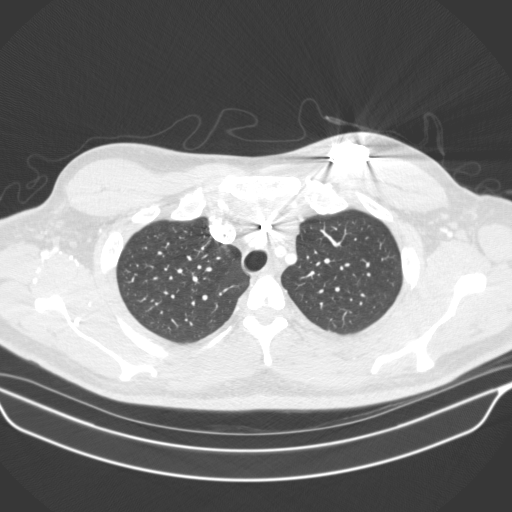
[im 377/409  soft-tissue]
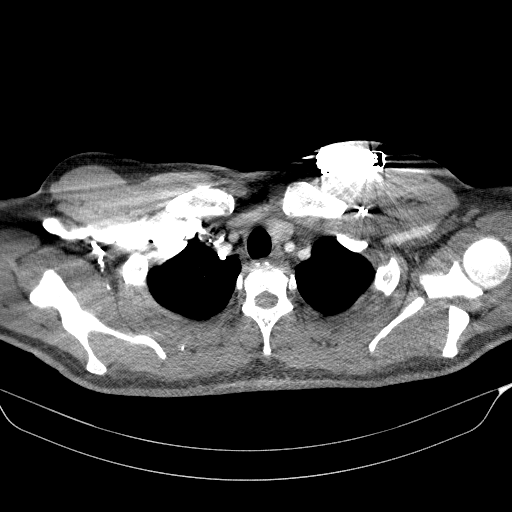

[12 of 32 positions shown; findings below may reference images not displayed]

RADIATION DOSE REDUCTION: This exam was performed according to the
departmental dose-optimization program which includes automated
exposure control, adjustment of the mA and/or kV according to
patient size and/or use of iterative reconstruction technique.

CONTRAST:  75mL 56X3V5-K4F IOPAMIDOL ([id number]) INJECTION 76%
FINDINGS: CARDIOVASCULAR:

Limitations by motion: Severe

Preferential opacification of the thoracic aorta. No evidence of
thoracic aortic dissection.

Aortic Root: Motion degraded, such that the aortic root can not be
accurately measured.

Thoracic Aorta:

--Ascending Aorta: 3.7 cm, previously 4.0 cm

--Aortic Arch: 3.3 cm

--Descending Aorta: 3.1 cm, previously 3.3 cm

Other:

LEFT aortic arch and common "bovine" variant, with shared origin of
the brachiocephalic and LEFT common carotid arteries.

LEFT subclavian AICD with intact triple leads.

Biventricular cardiac enlargement, greater than expected in a
patient of this age. No pericardial effusion.

Mediastinum/Nodes: No enlarged mediastinal, hilar, or axillary lymph
nodes. Thyroid gland, trachea, and esophagus demonstrate no
significant findings.

Lungs/Pleura: Lungs are clear. No suspicious pulmonary nodule or
mass. No pleural effusion or pneumothorax.

Upper Abdomen: Partial gastrectomy.  No acute abnormality.

Musculoskeletal: No chest wall abnormality. No acute or significant
osseous findings.

Review of the MIP images confirms the above findings.
IMPRESSION: Suboptimal evaluation secondary to motion degradation, within these
constraints;

1. No acute vascular abnormality within the chest.
[DATE] cm ascending thoracic aorta, with the aortic arch measuring
up to 3.3 cm. Continue annual imaging followup by CTA or MRA.
This recommendation follows 9999
ACCF/AHA/AATS/ACR/ASA/SCA/HALLIA/RAUFISHAQ/JUWAIDI/RUFFIN Guidelines for the
Diagnosis and Management of Patients with Thoracic Aortic Disease.
Circulation.9999; 121: E266-e369. Aortic aneurysm NOS ([id number]-XXH.J)

## 2021-10-08 MED ORDER — IOPAMIDOL (ISOVUE-370) INJECTION 76%
75.0000 mL | Freq: Once | INTRAVENOUS | Status: AC | PRN
  Administered 2021-10-08: 75 mL via INTRAVENOUS

## 2021-10-08 NOTE — Progress Notes (Signed)
Remote ICD transmission.   

## 2021-10-15 ENCOUNTER — Other Ambulatory Visit: Payer: Self-pay | Admitting: Cardiovascular Disease

## 2021-10-20 ENCOUNTER — Ambulatory Visit (INDEPENDENT_AMBULATORY_CARE_PROVIDER_SITE_OTHER): Payer: Medicaid Other | Admitting: *Deleted

## 2021-10-20 DIAGNOSIS — Z5181 Encounter for therapeutic drug level monitoring: Secondary | ICD-10-CM | POA: Diagnosis not present

## 2021-10-20 DIAGNOSIS — I480 Paroxysmal atrial fibrillation: Secondary | ICD-10-CM | POA: Diagnosis not present

## 2021-10-20 LAB — POCT INR: INR: 3 (ref 2.0–3.0)

## 2021-10-20 NOTE — Patient Instructions (Signed)
Continue warfarin 1 1/2 tablets daily except 1 tablet on Sundays and Thursdays. Recheck 4 wks

## 2021-11-16 ENCOUNTER — Ambulatory Visit (INDEPENDENT_AMBULATORY_CARE_PROVIDER_SITE_OTHER): Payer: Medicaid Other | Admitting: Nurse Practitioner

## 2021-11-16 ENCOUNTER — Encounter: Payer: Self-pay | Admitting: Nurse Practitioner

## 2021-11-16 DIAGNOSIS — R300 Dysuria: Secondary | ICD-10-CM

## 2021-11-16 LAB — URINALYSIS, ROUTINE W REFLEX MICROSCOPIC
Bilirubin, UA: NEGATIVE
Glucose, UA: NEGATIVE
Ketones, UA: NEGATIVE
Nitrite, UA: NEGATIVE
Specific Gravity, UA: 1.015 (ref 1.005–1.030)
Urobilinogen, Ur: 0.2 mg/dL (ref 0.2–1.0)
pH, UA: 5.5 (ref 5.0–7.5)

## 2021-11-16 LAB — MICROSCOPIC EXAMINATION
Epithelial Cells (non renal): NONE SEEN /hpf (ref 0–10)
RBC, Urine: >30 /hpf — AB (ref 0–2)
Renal Epithel, UA: NONE SEEN /hpf

## 2021-11-16 MED ORDER — CEPHALEXIN 500 MG PO CAPS: 500.0000 mg | ORAL_CAPSULE | Freq: Two times a day (BID) | ORAL | 0 refills | Status: DC

## 2021-11-16 NOTE — Progress Notes (Signed)
   Virtual Visit  Note Due to COVID-19 pandemic this visit was conducted virtually. This visit type was conducted due to national recommendations for restrictions regarding the COVID-19 Pandemic (e.g. social distancing, sheltering in place) in an effort to limit this patient's exposure and mitigate transmission in our community. All issues noted in this document were discussed and addressed.  A physical exam was not performed with this format.  I connected with Danny E Slocumb on 11/16/21 at 4:20 PM by telephone and verified that I am speaking with the correct person using two identifiers. Danny Hinton is currently located at home during visit. The provider, Daryll Drown, NP is located in their office at time of visit.  I discussed the limitations, risks, security and privacy concerns of performing an evaluation and management service by telephone and the availability of in person appointments. I also discussed with the patient that there may be a patient responsible charge related to this service. The patient expressed understanding and agreed to proceed.   History and Present Illness:  Dysuria  This is a new problem. The current episode started in the past 7 days. The problem has been gradually worsening. The quality of the pain is described as aching and burning. The pain is moderate. There has been no fever. There is No history of pyelonephritis. Associated symptoms include flank pain. Pertinent negatives include no chills or discharge. He has tried nothing for the symptoms.      Review of Systems  Constitutional: Negative.  Negative for chills.  HENT: Negative.    Respiratory: Negative.    Cardiovascular: Negative.   Gastrointestinal: Negative.   Genitourinary:  Positive for dysuria and flank pain.  Skin: Negative.   All other systems reviewed and are negative.    Observations/Objective: Televisit patient not in distress.  Assessment and Plan:   Appears well, in no apparent  distress.  Vital signs are normal. The abdomen is soft without tenderness, guarding, mass, rebound or organomegaly. No CVA tenderness or inguinal adenopathy noted. Urine dipstick shows positive for RBC's, positive for protein, and positive for leukocytes.  Micro exam: Moderate + bacteria.   UTI uncomplicated without evidence of pyelonephritis -Cultures pending -Keflex 500 mg tablet by mouth twice daily.  PLAN: Treatment per orders - also push fluids, may use Pyridium OTC prn. Call or return to clinic prn if these symptoms worsen or fail to improve as anticipated.    Follow Up Instructions: Follow-up for worsening unresolved symptoms.    I discussed the assessment and treatment plan with the patient. The patient was provided an opportunity to ask questions and all were answered. The patient agreed with the plan and demonstrated an understanding of the instructions.   The patient was advised to call back or seek an in-person evaluation if the symptoms worsen or if the condition fails to improve as anticipated.  The above assessment and management plan was discussed with the patient. The patient verbalized understanding of and has agreed to the management plan. Patient is aware to call the clinic if symptoms persist or worsen. Patient is aware when to return to the clinic for a follow-up visit. Patient educated on when it is appropriate to go to the emergency department.   Time call ended: 4:32 PM  I provided 12 minutes of  non face-to-face time during this encounter.    Daryll Drown, NP

## 2021-11-16 NOTE — Patient Instructions (Signed)

## 2021-11-18 ENCOUNTER — Ambulatory Visit (INDEPENDENT_AMBULATORY_CARE_PROVIDER_SITE_OTHER): Payer: Medicaid Other | Admitting: *Deleted

## 2021-11-18 DIAGNOSIS — Z5181 Encounter for therapeutic drug level monitoring: Secondary | ICD-10-CM

## 2021-11-18 DIAGNOSIS — I480 Paroxysmal atrial fibrillation: Secondary | ICD-10-CM

## 2021-11-18 LAB — POCT INR: INR: 2.9 (ref 2.0–3.0)

## 2021-11-18 LAB — CULTURE, URINE COMPREHENSIVE

## 2021-11-18 NOTE — Patient Instructions (Signed)
Continue warfarin 1 1/2 tablets daily except 1 tablet on Sundays and Thursdays. Recheck 6 wks

## 2021-11-20 ENCOUNTER — Other Ambulatory Visit: Payer: Self-pay | Admitting: Nurse Practitioner

## 2021-11-20 DIAGNOSIS — R8279 Other abnormal findings on microbiological examination of urine: Secondary | ICD-10-CM

## 2021-11-20 MED ORDER — AMOXICILLIN-POT CLAVULANATE 875-125 MG PO TABS: 1.0000 | ORAL_TABLET | Freq: Two times a day (BID) | ORAL | 0 refills | Status: DC

## 2021-12-03 ENCOUNTER — Encounter: Payer: Self-pay | Admitting: Nurse Practitioner

## 2021-12-03 ENCOUNTER — Other Ambulatory Visit: Payer: Self-pay | Admitting: Nurse Practitioner

## 2021-12-03 ENCOUNTER — Other Ambulatory Visit: Payer: Self-pay | Admitting: Family Medicine

## 2021-12-03 ENCOUNTER — Telehealth: Payer: Self-pay | Admitting: Nurse Practitioner

## 2021-12-03 DIAGNOSIS — R8279 Other abnormal findings on microbiological examination of urine: Secondary | ICD-10-CM

## 2021-12-03 MED ORDER — DOXYCYCLINE HYCLATE 100 MG PO CAPS: 100.0000 mg | ORAL_CAPSULE | Freq: Two times a day (BID) | ORAL | 0 refills | Status: DC

## 2021-12-03 NOTE — Telephone Encounter (Signed)
Pt had a visit with [name] on 11/16/21 for UTI and was told to call the office if symptoms did not get any better. Pt says he is still having UTI symptoms and needs advise on what to do.

## 2021-12-03 NOTE — Telephone Encounter (Signed)
Patient aware.

## 2021-12-03 NOTE — Telephone Encounter (Signed)
Please let the patient know that I sent their prescription to their pharmacy. Thanks, WS 

## 2021-12-10 ENCOUNTER — Other Ambulatory Visit: Payer: Self-pay | Admitting: Family Medicine

## 2021-12-11 ENCOUNTER — Encounter: Payer: Self-pay | Admitting: Family Medicine

## 2021-12-11 ENCOUNTER — Other Ambulatory Visit (HOSPITAL_COMMUNITY)
Admission: RE | Admit: 2021-12-11 | Discharge: 2021-12-11 | Disposition: A | Discharge status: Home / Self Care | Payer: Medicaid Other | Source: Ambulatory Visit | Attending: Family Medicine | Admitting: Family Medicine

## 2021-12-11 ENCOUNTER — Ambulatory Visit (INDEPENDENT_AMBULATORY_CARE_PROVIDER_SITE_OTHER): Payer: Medicaid Other | Admitting: Family Medicine

## 2021-12-11 ENCOUNTER — Other Ambulatory Visit: Payer: Self-pay | Admitting: Family Medicine

## 2021-12-11 VITALS — BP 86/55 | HR 61 | Temp 97.4°F | Ht 76.0 in | Wt 240.0 lb

## 2021-12-11 DIAGNOSIS — R361 Hematospermia: Secondary | ICD-10-CM | POA: Insufficient documentation

## 2021-12-11 DIAGNOSIS — R310 Gross hematuria: Secondary | ICD-10-CM | POA: Diagnosis not present

## 2021-12-11 LAB — MICROSCOPIC EXAMINATION: Renal Epithel, UA: NONE SEEN /hpf

## 2021-12-11 LAB — URINALYSIS, COMPLETE
Bilirubin, UA: NEGATIVE
Glucose, UA: NEGATIVE
Ketones, UA: NEGATIVE
Leukocytes,UA: NEGATIVE
Nitrite, UA: NEGATIVE
Protein,UA: NEGATIVE
Specific Gravity, UA: 1.02 (ref 1.005–1.030)
Urobilinogen, Ur: 1 mg/dL (ref 0.2–1.0)
pH, UA: 7 (ref 5.0–7.5)

## 2021-12-11 LAB — COAGUCHEK XS/INR WAIVED
INR: 2.6 — ABNORMAL HIGH (ref 0.9–1.1)
Prothrombin Time: 31.2 s

## 2021-12-11 MED ORDER — LORATADINE 10 MG PO TABS: 10.0000 mg | ORAL_TABLET | Freq: Every day | ORAL | 1 refills | Status: DC

## 2021-12-11 NOTE — Progress Notes (Signed)
Assessment & Plan:  1. Blood in semen INR at goal.  Labs and STI testing pending.  Discussed if nothing is found that I can treat I will place a referral to urology. - CoaguChek XS/INR Waived - 2.6 - CBC with Differential/Platelet - PSA, total and free - Urine cytology ancillary only  2. Gross hematuria - Urinalysis, Complete - Urine dipstick shows positive for RBC's.  Micro exam: 0-5 WBC's per HPF, 0-2 RBC's per HPF, and few bacteria. - Urine Culture   Follow up plan: Return if symptoms worsen or fail to improve.  [name], MSN, APRN, FNP-C [facility]  Subjective:   Patient ID: Danny Hinton, male    DOB: 15-Mar-1981, 41 y.o.   MRN: [id number]  HPI: Danny Hinton is a 41 y.o. male presenting on 12/11/2021 for Hematuria (Groin pain, lower abd discomfort)  Patient was initially seen 3-1/2 weeks ago for a UTI, which was treated with Augmentin.  Symptoms at that time included blood in his urine, constant urination, and the sensation that he was peeing nails.  The symptoms resolved with the antibiotics.  Five days after completion of Augmentin he started experiencing a pressure in his lower abdomen and blood in his semen.  He sent a MyChart message and got placed on doxycycline, which she has two days left of.  Today he noticed blood in his urine for the first time since his UTI.  There has been no improvement in his recent symptoms since being on doxycycline.  Denies scrotal pain, swelling, erectile dysfunction, back pain, trouble initiating a urine stream, weak stream, split stream, nocturia, and dribbling.  No history of kidney stones.  He is faithful to his wife and knows that she is as well.  He does take warfarin and has not had his INR checked since he has been on both of these antibiotics.   ROS: Negative unless specifically indicated above in HPI.   Relevant past medical history reviewed and updated as indicated.   Allergies and medications reviewed and  updated.   Current Outpatient Medications:    atorvastatin (LIPITOR) 40 MG tablet, Take 1 tablet (40 mg total) by mouth daily., Disp: 90 tablet, Rfl: 0   carvedilol (COREG) 25 MG tablet, Take half a tablet (12.5 mg) in the morning and 25 mg, a whole tablet, in the evening., Disp: 45 tablet, Rfl: 11   doxycycline (VIBRAMYCIN) 100 MG capsule, Take 1 capsule (100 mg total) by mouth 2 (two) times daily., Disp: 20 capsule, Rfl: 0   ENTRESTO 49-51 MG, Take 1 tablet by mouth twice daily, Disp: 180 tablet, Rfl: 0   EQ ALLERGY RELIEF 10 MG tablet, Take 1 tablet by mouth once daily, Disp: 30 tablet, Rfl: 0   FLUoxetine (PROZAC) 20 MG tablet, Take 20 mg by mouth daily., Disp: , Rfl:    fluticasone (FLONASE) 50 MCG/ACT nasal spray, Place 1 spray into both nostrils as needed., Disp: 16 g, Rfl: 11   furosemide (LASIX) 40 MG tablet, Take 40 mg by mouth as needed., Disp: , Rfl:    loperamide (IMODIUM A-D) 2 MG tablet, Take as needed, Disp: 30 tablet, Rfl: 0   loratadine (CLARITIN) 10 MG tablet, Take 10 mg by mouth daily in the afternoon., Disp: , Rfl:    metoprolol tartrate (LOPRESSOR) 25 MG tablet, Take 1 tablet as needed for palpitations, Disp: 90 tablet, Rfl: 0   Multiple Vitamin (MULTIVITAMIN WITH MINERALS) TABS tablet, Take 1 tablet by mouth daily., Disp: ,  Rfl:    ondansetron (ZOFRAN) 4 MG tablet, Take 1 tablet (4 mg total) by mouth every 8 (eight) hours as needed for nausea or vomiting., Disp: 20 tablet, Rfl: 0   Probiotic Product (VSL#3) CAPS, Take twice a day, Disp:  , Rfl:    Vitamin D, Ergocalciferol, (DRISDOL) 1.25 MG (50000 UNIT) CAPS capsule, Take 1 capsule (50,000 Units total) by mouth every 7 (seven) days. (Patient taking differently: Take 50,000 Units by mouth 2 (two) times a week. Tues and Thurs), Disp: 12 capsule, Rfl: 0   warfarin (COUMADIN) 5 MG tablet, Take 1 - 1 1/2 tablets daily or as directed by coumadin clinic, Disp: 135 tablet, Rfl: 1  No Known Allergies  Objective:   BP (!) 86/55    Pulse 61   Temp (!) 97.4 F (36.3 C) (Oral)   Ht 6\' 4"  (1.93 m)   Wt 240 lb (108.9 kg)   BMI 29.21 kg/m    Physical Exam Vitals reviewed.  Constitutional:      General: He is not in acute distress.    Appearance: Normal appearance. He is not ill-appearing, toxic-appearing or diaphoretic.  HENT:     Head: Normocephalic and atraumatic.  Eyes:     General: No scleral icterus.       Right eye: No discharge.        Left eye: No discharge.     Conjunctiva/sclera: Conjunctivae normal.  Cardiovascular:     Rate and Rhythm: Normal rate.  Pulmonary:     Effort: Pulmonary effort is normal. No respiratory distress.  Musculoskeletal:        General: Normal range of motion.     Cervical back: Normal range of motion.  Skin:    General: Skin is warm and dry.  Neurological:     Mental Status: He is alert and oriented to person, place, and time. Mental status is at baseline.  Psychiatric:        Mood and Affect: Mood normal.        Behavior: Behavior normal.        Thought Content: Thought content normal.        Judgment: Judgment normal.

## 2021-12-12 ENCOUNTER — Encounter: Payer: Self-pay | Admitting: Family Medicine

## 2021-12-12 DIAGNOSIS — R361 Hematospermia: Secondary | ICD-10-CM

## 2021-12-12 LAB — CBC WITH DIFFERENTIAL/PLATELET
Basophils Absolute: 0 10*3/uL (ref 0.0–0.2)
Basos: 1 %
EOS (ABSOLUTE): 0.4 10*3/uL (ref 0.0–0.4)
Eos: 12 %
Hematocrit: 40.6 % (ref 37.5–51.0)
Hemoglobin: 13.1 g/dL (ref 13.0–17.7)
Immature Grans (Abs): 0 10*3/uL (ref 0.0–0.1)
Immature Granulocytes: 0 %
Lymphocytes Absolute: 1.7 10*3/uL (ref 0.7–3.1)
Lymphs: 53 %
MCH: 27.2 pg (ref 26.6–33.0)
MCHC: 32.3 g/dL (ref 31.5–35.7)
MCV: 84 fL (ref 79–97)
Monocytes Absolute: 0.3 10*3/uL (ref 0.1–0.9)
Monocytes: 10 %
Neutrophils Absolute: 0.8 10*3/uL — ABNORMAL LOW (ref 1.4–7.0)
Neutrophils: 24 %
Platelets: 230 10*3/uL (ref 150–450)
RBC: 4.82 x10E6/uL (ref 4.14–5.80)
RDW: 12.8 % (ref 11.6–15.4)
WBC: 3.3 10*3/uL — ABNORMAL LOW (ref 3.4–10.8)

## 2021-12-12 LAB — PSA, TOTAL AND FREE
PSA, Free Pct: 26.7 %
PSA, Free: 0.16 ng/mL
Prostate Specific Ag, Serum: 0.6 ng/mL (ref 0.0–4.0)

## 2021-12-13 LAB — URINE CULTURE

## 2021-12-14 ENCOUNTER — Encounter: Payer: Self-pay | Admitting: Cardiovascular Disease

## 2021-12-14 ENCOUNTER — Ambulatory Visit (INDEPENDENT_AMBULATORY_CARE_PROVIDER_SITE_OTHER): Payer: Medicaid Other | Admitting: Cardiovascular Disease

## 2021-12-14 VITALS — BP 94/60 | HR 52 | Ht 76.0 in | Wt 238.0 lb

## 2021-12-14 DIAGNOSIS — E663 Overweight: Secondary | ICD-10-CM

## 2021-12-14 DIAGNOSIS — G4733 Obstructive sleep apnea (adult) (pediatric): Secondary | ICD-10-CM

## 2021-12-14 DIAGNOSIS — I480 Paroxysmal atrial fibrillation: Secondary | ICD-10-CM | POA: Diagnosis not present

## 2021-12-14 DIAGNOSIS — I7121 Aneurysm of the ascending aorta, without rupture: Secondary | ICD-10-CM

## 2021-12-14 DIAGNOSIS — I428 Other cardiomyopathies: Secondary | ICD-10-CM | POA: Diagnosis not present

## 2021-12-14 DIAGNOSIS — E785 Hyperlipidemia, unspecified: Secondary | ICD-10-CM

## 2021-12-14 DIAGNOSIS — D6869 Other thrombophilia: Secondary | ICD-10-CM

## 2021-12-14 DIAGNOSIS — E1169 Type 2 diabetes mellitus with other specified complication: Secondary | ICD-10-CM

## 2021-12-14 DIAGNOSIS — I10 Essential (primary) hypertension: Secondary | ICD-10-CM

## 2021-12-14 DIAGNOSIS — I5042 Chronic combined systolic (congestive) and diastolic (congestive) heart failure: Secondary | ICD-10-CM

## 2021-12-14 DIAGNOSIS — I471 Supraventricular tachycardia: Secondary | ICD-10-CM

## 2021-12-14 DIAGNOSIS — I4719 Other supraventricular tachycardia: Secondary | ICD-10-CM

## 2021-12-14 DIAGNOSIS — Z95810 Presence of automatic (implantable) cardiac defibrillator: Secondary | ICD-10-CM

## 2021-12-14 MED ORDER — CARVEDILOL 25 MG PO TABS: 12.5000 mg | ORAL_TABLET | Freq: Two times a day (BID) | ORAL | 3 refills | Status: DC

## 2021-12-14 NOTE — Progress Notes (Signed)
Patient ID: Danny Hinton, male   DOB: Aug 29, 1980, 41 y.o.   MRN: [id number]    Cardiology Office Note    Date:  12/15/2021   ID:  Danny Hinton, DOB [birth year], MRN [id number]  PCP:  Dettinger, Elige Radon, MD  Cardiologist:   Thurmon Fair, MD   Chief Complaint  Patient presents with   Shortness of Breath    History of Present Illness:  Danny Hinton is a 41 y.o. male who presents for nonischemic cardiomyopathy, chronic systolic heart failure, defibrillator checkup, history of atrial flutter, atrial fibrillation and paroxysmal atrial tachycardia (which has been sustained and led to heart failure decompensation).  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  After successful gastric bypass surgery (Roux-en-Y, March 2022) he has lost 222 pounds, almost 50% of his initial weight of 460 pounds.  Overall he feels better, but continues to complain of exertional dyspnea, especially when the weather is warm.  He cannot tolerate the heat.  His blood pressure is frequently in the 90-100/50-60 range at home, but he denies problems with dizziness.  He has not experienced syncope.  Bradycardic with heart rate of in the 50s, but requiring little pacing (atrial paced 9%, ventricular paced less than 1%).  There have been no episodes of mode switch in the last year and he has not had any ventricular tachycardias since February 2022.  He denies orthopnea, PND, lower extremity edema, syncope, focal neurological events, claudication.  He does have occasional palpitations even though his device has not recorded any sustained arrhythmia.  He may be having PACs and/or PVCs.  He has not had syncope and denies lower extreme edema, orthopnea or PND.  He does continue to have shortness of breath, NYHA functional class II-3.  His most recent echocardiogram was performed in September 2019 and showed LVEF 40%.  Echo has not been repeated since his  weight loss.  In September 2015 he underwent cavotricuspid isthmus ablation for atrial flutter. On 08/19/2014, while [place] hunting he develop persistent rapid palpitations and felt unwell. He underwent successful overdrive pacing via his device by Dr. Hillis Range. The rhythm was atypical atrial flutter, cycle length roughly 390 ms. The episode lasted for about 3 hours until he was successfully overdrive paced. In the past he has had paroxysmal atrial fibrillation. The decision was made to ablate his flutter secondary to the occurrence of multiple unnecessary defibrillator shocks in the setting of atrial flutter with rapid ventricular rate.   Past Medical History:  Diagnosis Date   Anxiety    Atypical atrial flutter (HCC) 08/19/2014   Pace terminated in device clinic.  AFL cycle length was 390 msec.   Automatic implantable cardioverter-defibrillator in situ    a. St Jude in 2011.   B12 nutritional deficiency, B12 = 214 (03/18/19) 04/29/2019   Chronic anticoagulation with Coumadin    Chronic systolic congestive heart failure (HCC)    a. suspected NICM - dx 2010. EF 15% by TEE, 10-20% by echo at that time. b. s/p St. Jude AICD 2011. c. Echo 01/2010: mod dilated LV, EF 30%, mod aortic root dilitation, no significant valvular disease.   Depression    Diabetes mellitus (HCC), Rx Victoza 04/29/2019   Dyslipidemia associated with type 2 diabetes mellitus (HCC), Rx Lipitor 04/29/2019   Eczema    Enlarged aorta (HCC)    Hypertension associated with diabetes (HCC)    Mobitz type 2 second degree atrioventricular  block    a. During sleeping hours in 2010 suspected due to OSA.   Morbid obesity (HCC)    Nonischemic cardiomyopathy (HCC)    OSA treated with BiPAP 09/04/2018   Severe obstructive sleep apnea with an AHI of 67.2/h and nocturnal hypoxemia with oxygen saturations as low as 82%. Now on BIPAP at 18/14 cm H2O.   PAF (paroxysmal atrial fibrillation) (HCC)    Palpitations    Paroxysmal atrial  flutter (HCC), s/p ablation    Seasonal allergies    Sleep apnea    Thoracic aortic aneurysm (HCC), monitored annually 04/29/2019   Vitamin D deficiency, vitamin D = 6.9 (03/28/19) 04/29/2019    Past Surgical History:  Procedure Laterality Date   ATRIAL FLUTTER ABLATION N/A 01/18/2014   Procedure: ATRIAL FLUTTER ABLATION;  Surgeon: Duke Salvia, MD;  Location: [facility] CATH LAB;  Service: Cardiovascular;  Laterality: N/A;   CARDIAC DEFIBRILLATOR PLACEMENT  02/17/10   St. Jude Medical 45DR, model number [id number], serial number [id number]   CARDIOVERSION N/A 12/29/2013   Procedure: CARDIOVERSION;  Surgeon: Duke Salvia, MD;  Location: [facility];  Service: Cardiovascular;  Laterality: N/A;   ICD GENERATOR CHANGEOUT N/A 03/24/2020   Procedure: ICD GENERATOR CHANGEOUT;  Surgeon: Thurmon Fair, MD;  Location: [facility] INVASIVE CV LAB;  Service: Cardiovascular;  Laterality: N/A;   TOOTH EXTRACTION N/A 10/23/2012   Procedure: EXTRACTION TEETH 1, 16, 17, 30, 31;  Surgeon: [name], DDS;  Location: [facility] OR;  Service: Oral Surgery;  Laterality: N/A;    Outpatient Medications Prior to Visit  Medication Sig Dispense Refill   atorvastatin (LIPITOR) 40 MG tablet Take 1 tablet (40 mg total) by mouth daily. 90 tablet 0   doxycycline (VIBRAMYCIN) 100 MG capsule Take 1 capsule (100 mg total) by mouth 2 (two) times daily. 20 capsule 0   ENTRESTO 49-51 MG Take 1 tablet by mouth twice daily 180 tablet 0   FLUoxetine (PROZAC) 20 MG tablet Take 20 mg by mouth daily.     fluticasone (FLONASE) 50 MCG/ACT nasal spray Place 1 spray into both nostrils as needed. 16 g 11   furosemide (LASIX) 40 MG tablet Take 40 mg by mouth as needed.     loperamide (IMODIUM A-D) 2 MG tablet Take as needed 30 tablet 0   loratadine (EQ ALLERGY RELIEF) 10 MG tablet Take 1 tablet by mouth once daily 30 tablet 0   Multiple Vitamin (MULTIVITAMIN WITH MINERALS) TABS tablet Take 1 tablet by mouth daily.     Probiotic Product (VSL#3) CAPS Take twice  a day     Vitamin D, Ergocalciferol, (DRISDOL) 1.25 MG (50000 UNIT) CAPS capsule Take 1 capsule (50,000 Units total) by mouth every 7 (seven) days. (Patient taking differently: Take 50,000 Units by mouth 2 (two) times a week. Tues and Thurs) 12 capsule 0   warfarin (COUMADIN) 5 MG tablet Take 1 - 1 1/2 tablets daily or as directed by coumadin clinic 135 tablet 1   carvedilol (COREG) 25 MG tablet Take half a tablet (12.5 mg) in the morning and 25 mg, a whole tablet, in the evening. 45 tablet 11   loratadine (CLARITIN) 10 MG tablet Take 1 tablet (10 mg total) by mouth daily in the afternoon. (Patient not taking: Reported on 12/14/2021) 30 tablet 1   metoprolol tartrate (LOPRESSOR) 25 MG tablet Take 1 tablet as needed for palpitations (Patient not taking: Reported on 12/14/2021) 90 tablet 0   ondansetron (ZOFRAN) 4 MG tablet Take 1 tablet (4 mg  total) by mouth every 8 (eight) hours as needed for nausea or vomiting. (Patient not taking: Reported on 12/14/2021) 20 tablet 0   No facility-administered medications prior to visit.     Allergies:   Patient has no known allergies.   Social History   Socioeconomic History   Marital status: Married    Spouse name: Not on file   Number of children: 1   Years of education: Not on file   Highest education level: Not on file  Occupational History   Occupation: disabled  Tobacco Use   Smoking status: Never   Smokeless tobacco: Never  Vaping Use   Vaping Use: Never used  Substance and Sexual Activity   Alcohol use: No   Drug use: No   Sexual activity: Yes  Other Topics Concern   Not on file  Social History Narrative   Not on file   Social Determinants of Health   Financial Resource Strain: Not on file  Food Insecurity: Not on file  Transportation Needs: Not on file  Physical Activity: Not on file  Stress: Not on file  Social Connections: Not on file     Family History:  The patient's family history includes Anxiety disorder in his mother;  Arrhythmia in his mother; Colon polyps in his father; Depression in his mother; Diabetes in his paternal grandmother; Heart disease in his maternal grandfather and mother; Hodgkin's lymphoma (age of onset: 37) in his brother; Irritable bowel syndrome in his maternal aunt.   ROS:   Please see the history of present illness.    ROS All other systems are reviewed and are negative.   PHYSICAL EXAM:   VS:  BP 94/60 (BP Location: Left Arm, Patient Position: Sitting, Cuff Size: Normal)   Pulse (!) 52   Ht 6\' 4"  (1.93 m)   Wt 238 lb (108 kg)   SpO2 97%   BMI 28.97 kg/m       General: Alert, oriented x3, no distress, mildly overweight, healthy left subclavian defibrillator site Head: no evidence of trauma, PERRL, EOMI, no exophtalmos or lid lag, no myxedema, no xanthelasma; normal ears, nose and oropharynx Neck: normal jugular venous pulsations and no hepatojugular reflux; brisk carotid pulses without delay and no carotid bruits Chest: clear to auscultation, no signs of consolidation by percussion or palpation, normal fremitus, symmetrical and full respiratory excursions Cardiovascular: normal position and quality of the apical impulse, regular rhythm, normal first and second heart sounds, no murmurs, rubs or gallops Abdomen: no tenderness or distention, no masses by palpation, no abnormal pulsatility or arterial bruits, normal bowel sounds, no hepatosplenomegaly Extremities: no clubbing, cyanosis or edema; 2+ radial, ulnar and brachial pulses bilaterally; 2+ right femoral, posterior tibial and dorsalis pedis pulses; 2+ left femoral, posterior tibial and dorsalis pedis pulses; no subclavian or femoral bruits Neurological: grossly nonfocal Psych: Normal mood and affect     Wt Readings from Last 3 Encounters:  12/14/21 238 lb (108 kg)  12/11/21 240 lb (108.9 kg)  09/14/21 238 lb (108 kg)      Studies/Labs Reviewed:   ECHO 01/27/2018: - Left ventricle: The cavity size was mildly dilated.  There was mild concentric hypertrophy. Systolic function was normal. The estimated ejection fraction was 40%. Wall motion was normal; there were no regional wall motion abnormalities. Doppler parameters are consistent with abnormal left ventricular relaxation (grade 1 diastolic dysfunction). There was no evidence of elevated ventricular filling pressure by Doppler parameters. - Aortic valve: There was no regurgitation. - Aortic root:  The aortic root was normal in size. - Mitral valve: There was mild regurgitation. Valve area by pressure half-time: 2.22 cm^2. - Left atrium: The atrium was mildly dilated. - Right ventricle: Pacer wire or catheter noted in right ventricle. Systolic function was normal. - Right atrium: Pacer wire or catheter noted in right atrium. - Tricuspid valve: There was mild regurgitation. - Pulmonic valve: There was mild regurgitation. - Inferior vena cava: The vessel was normal in size. - Pericardium, extracardiac: There was no pericardial effusion.   Impressions:   - There has been no significant change since the prior study on 07/31/2015, LVEF remains moderately decreased estimated at 40%.   Aortic root is dilated at the sinus level with maximum diameter 45 mm. Ascending aortic size is stable at 40 mm.  EKG:  EKG is ordered today and shows atrial paced, ventricular sensed rhythm at 51 bpm.  There is slightly long AV delay at 232 ms and the QRS is broad at 116 ms.  QT is 400 ms (unadjusted) there are no ischemic changes. Recent Labs: 06/04/2021: ALT 20 09/14/2021: BUN 9; Creatinine, Ser 0.74; Potassium 3.8; Sodium 140 12/11/2021: Hemoglobin 13.1; Platelets 230   Lipid Panel    Component Value Date/Time   CHOL 122 06/04/2021 0921   TRIG 38 06/04/2021 0921   HDL 37 (L) 06/04/2021 0921   CHOLHDL 3.3 06/04/2021 0921   CHOLHDL 5.3 03/20/2009 0410   VLDL 22 03/20/2009 0410   LDLCALC 75 06/04/2021 0921   LDLDIRECT 52 03/26/2015 0824     ASSESSMENT:    1. Chronic  combined systolic and diastolic heart failure (HCC)   2. Atrial tachycardia (HCC)   3. Non-ischemic cardiomyopathy (HCC)   4. PAT (paroxysmal atrial tachycardia) (HCC)   5. Paroxysmal atrial fibrillation (HCC)   6. Acquired thrombophilia (HCC)   7. ICD (implantable cardioverter-defibrillator) in place   8. Overweight (BMI 25.0-29.9)   9. Dyslipidemia associated with type 2 diabetes mellitus (HCC), Rx Lipitor   10. Essential hypertension   11. OSA treated with BiPAP   12. Aneurysm of ascending aorta without rupture (HCC)      PLAN:  In order of problems listed above:    CHF: Unchanged clinical status, NYHA functional class 2-3.  Clinically euvolemic, taking diuretics only as needed.  He has had a phenomenal weight loss of almost 50% of body weight, well over 200 pounds since gastric bypass.  With lower blood pressures, we have had to reduce his dose of carvedilol and he is experienced some palpitations now (although he does not have sustained atrial tachycardia or ventricular arrhythmia on the ICD recordings).  We probably need to gradually reduce his heart failure medications to avoid symptomatic hypotension.  Recheck echocardiogram.  If EF is improved may reduce the Entresto.  Hold off starting SGLT2 inhibitors for the time being. CMP: Nonischemic mechanism. Most recent LV ejection fraction 40%.  Recheck echo. Paroxysmal atrial tachycardia/atypical flutter/AFib: with history of previous cavotricuspid isthmus flutter ablation and subsequent episodes of symptomatic paroxysmal atrial tachycardia with 1: 1 AV conduction.  Markedly reduced burden of arrhythmia since weight loss. Warfarin: No bleeding issues. ICD: Normal device function.  Remote downloads every 3 months. Overweight: He has had a remarkably rapid weight loss after gastric bypass, now he is only moderately overweight. HLP: Despite substantial weight loss, there was only mild improvement in his HDL cholesterol which is  disappointing. DM: after weight loss, this resolved HTN: Probably need to decrease his heart failure medications further to avoid  symptomatic hypotension. OSA: Compliant with BiPAP, denies hypersomnolence. TAAA: remarkably, his aortic dimensions are getting smaller. seeing D. Bartle.  Per last CTA: Thoracic Aorta: --Ascending Aorta: 3.7 cm, previously 4.0 cm --Aortic Arch: 3.3 cm --Descending Aorta: 3.1 cm, previously 3.3 cm No aortic root measurement reported, but by my measurement 45 mm.  Medication Adjustments/Labs and Tests Ordered: Current medicines are reviewed at length with the patient today.  Concerns regarding medicines are outlined above.  Medication changes, Labs and Tests ordered today are listed in the Patient Instructions below. Patient Instructions  Medication Instructions:  No changes *If you need a refill on your cardiac medications before your next appointment, please call your pharmacy*   Lab Work: None ordered If you have labs (blood work) drawn today and your tests are completely normal, you will receive your results only by: MyChart Message (if you have MyChart) OR A paper copy in the mail If you have any lab test that is abnormal or we need to change your treatment, we will call you to review the results.   Testing/Procedures: Your physician has requested that you have an echocardiogram. Echocardiography is a painless test that uses sound waves to create images of your heart. It provides your doctor with information about the size and shape of your heart and how well your heart's chambers and valves are working. You may receive an ultrasound enhancing agent through an IV if needed to better visualize your heart during the echo.This procedure takes approximately one hour. There are no restrictions for this procedure. This will take place at the [address].     Follow-Up: At [facility], you and your health needs are our priority.  As part  of our continuing mission to provide you with exceptional heart care, we have created designated Provider Care Teams.  These Care Teams include your primary Cardiologist (physician) and Advanced Practice Providers (APPs -  Physician Assistants and Nurse Practitioners) who all work together to provide you with the care you need, when you need it.  We recommend signing up for the patient portal called "MyChart".  Sign up information is provided on this After Visit Summary.  MyChart is used to connect with patients for Virtual Visits (Telemedicine).  Patients are able to view lab/test results, encounter notes, upcoming appointments, etc.  Non-urgent messages can be sent to your provider as well.   To learn more about what you can do with MyChart, go to [email or web address].    Your next appointment:   Follow up in 6 months with Dr. Royann Shivers on a device day Follow up first available with Dr. Mayford Knife for sleep clinic  Other Instructions Send a rhythm strip when you are having symptoms.  Important Information About Sugar           Signed, Thurmon Fair, MD  12/15/2021 12:13 PM    [facility] [address] [place], [place], [place]  [address] Phone: [phone]; Fax: [phone]

## 2021-12-14 NOTE — Patient Instructions (Signed)
Medication Instructions:  No changes *If you need a refill on your cardiac medications before your next appointment, please call your pharmacy*   Lab Work: None ordered If you have labs (blood work) drawn today and your tests are completely normal, you will receive your results only by: MyChart Message (if you have MyChart) OR A paper copy in the mail If you have any lab test that is abnormal or we need to change your treatment, we will call you to review the results.   Testing/Procedures: Your physician has requested that you have an echocardiogram. Echocardiography is a painless test that uses sound waves to create images of your heart. It provides your doctor with information about the size and shape of your heart and how well your heart's chambers and valves are working. You may receive an ultrasound enhancing agent through an IV if needed to better visualize your heart during the echo.This procedure takes approximately one hour. There are no restrictions for this procedure. This will take place at the [address], [address].     Follow-Up: At [facility], you and your health needs are our priority.  As part of our continuing mission to provide you with exceptional heart care, we have created designated Provider Care Teams.  These Care Teams include your primary Cardiologist (physician) and Advanced Practice Providers (APPs -  Physician Assistants and Nurse Practitioners) who all work together to provide you with the care you need, when you need it.  We recommend signing up for the patient portal called "MyChart".  Sign up information is provided on this After Visit Summary.  MyChart is used to connect with patients for Virtual Visits (Telemedicine).  Patients are able to view lab/test results, encounter notes, upcoming appointments, etc.  Non-urgent messages can be sent to your provider as well.   To learn more about what you can do with MyChart, go to [email or web address].     Your next appointment:   Follow up in 6 months with Dr. Royann Shivers on a device day Follow up first available with Dr. Mayford Knife for sleep clinic  Other Instructions Send a rhythm strip when you are having symptoms.  Important Information About Sugar

## 2021-12-15 ENCOUNTER — Encounter: Payer: Self-pay | Admitting: Cardiovascular Disease

## 2021-12-15 ENCOUNTER — Encounter: Payer: Self-pay | Admitting: Family Medicine

## 2021-12-15 LAB — URINE CYTOLOGY ANCILLARY ONLY
Chlamydia: NEGATIVE
Comment: NEGATIVE
Comment: NEGATIVE
Comment: NORMAL
Neisseria Gonorrhea: NEGATIVE
Trichomonas: NEGATIVE

## 2021-12-15 NOTE — Telephone Encounter (Signed)
Please have patient follow up with PCP in the morning if symptoms are not resolving for reassessment. Thank you

## 2021-12-16 ENCOUNTER — Encounter (INDEPENDENT_AMBULATORY_CARE_PROVIDER_SITE_OTHER): Payer: Self-pay

## 2021-12-17 ENCOUNTER — Other Ambulatory Visit (HOSPITAL_COMMUNITY): Payer: Medicaid Other

## 2021-12-22 ENCOUNTER — Ambulatory Visit (INDEPENDENT_AMBULATORY_CARE_PROVIDER_SITE_OTHER): Payer: Medicaid Other

## 2021-12-22 DIAGNOSIS — I428 Other cardiomyopathies: Secondary | ICD-10-CM | POA: Diagnosis not present

## 2021-12-22 LAB — CUP PACEART REMOTE DEVICE CHECK
Battery Remaining Longevity: 85 mo
Battery Remaining Percentage: 82 %
Battery Voltage: 2.99 V
Brady Statistic AP VP Percent: 1 %
Brady Statistic AP VS Percent: 15 %
Brady Statistic AS VP Percent: 1 %
Brady Statistic AS VS Percent: 85 %
Brady Statistic RA Percent Paced: 14 %
Brady Statistic RV Percent Paced: 1 %
Date Time Interrogation Session: 20230815020145
HighPow Impedance: 39 Ohm
Implantable Lead Implant Date: 20111010
Implantable Lead Implant Date: 20111010
Implantable Lead Location: 753859
Implantable Lead Location: 753860
Implantable Pulse Generator Implant Date: 20211115
Lead Channel Impedance Value: 390 Ohm
Lead Channel Impedance Value: 400 Ohm
Lead Channel Pacing Threshold Amplitude: 0.75 V
Lead Channel Pacing Threshold Amplitude: 1.25 V
Lead Channel Pacing Threshold Pulse Width: 0.5 ms
Lead Channel Pacing Threshold Pulse Width: 1 ms
Lead Channel Sensing Intrinsic Amplitude: 11.7 mV
Lead Channel Sensing Intrinsic Amplitude: 3.1 mV
Lead Channel Setting Pacing Amplitude: 2.5 V
Lead Channel Setting Pacing Amplitude: 2.5 V
Lead Channel Setting Pacing Pulse Width: 0.5 ms
Lead Channel Setting Sensing Sensitivity: 0.5 mV
Pulse Gen Serial Number: 111031056

## 2021-12-23 ENCOUNTER — Encounter: Payer: Self-pay | Admitting: Family Medicine

## 2021-12-23 ENCOUNTER — Ambulatory Visit (INDEPENDENT_AMBULATORY_CARE_PROVIDER_SITE_OTHER): Payer: Medicaid Other

## 2021-12-23 ENCOUNTER — Ambulatory Visit (INDEPENDENT_AMBULATORY_CARE_PROVIDER_SITE_OTHER): Payer: Medicaid Other | Admitting: Family Medicine

## 2021-12-23 VITALS — BP 104/63 | HR 58 | Temp 97.8°F | Ht 76.0 in | Wt 244.2 lb

## 2021-12-23 DIAGNOSIS — R310 Gross hematuria: Secondary | ICD-10-CM | POA: Diagnosis not present

## 2021-12-23 DIAGNOSIS — R319 Hematuria, unspecified: Secondary | ICD-10-CM | POA: Diagnosis not present

## 2021-12-23 DIAGNOSIS — R361 Hematospermia: Secondary | ICD-10-CM

## 2021-12-23 LAB — URINALYSIS, ROUTINE W REFLEX MICROSCOPIC
Bilirubin, UA: NEGATIVE
Glucose, UA: NEGATIVE
Leukocytes,UA: NEGATIVE
Nitrite, UA: NEGATIVE
Protein,UA: NEGATIVE
Specific Gravity, UA: 1.02 (ref 1.005–1.030)
Urobilinogen, Ur: 1 mg/dL (ref 0.2–1.0)
pH, UA: 6 (ref 5.0–7.5)

## 2021-12-23 LAB — MICROSCOPIC EXAMINATION: Renal Epithel, UA: NONE SEEN /hpf

## 2021-12-23 NOTE — Progress Notes (Signed)
Assessment & Plan:  1-2. Gross hematuria/Blood in semen Discussed with patient there is no further work-up I can do in our office, but he needs to keep his upcoming appointment with urology. - DG Abd 1 View; Future - Urinalysis, Routine w reflex microscopic   Follow up plan: Return if symptoms worsen or fail to improve.  Danny Boston, MSN, APRN, FNP-C [facility]  Subjective:   Patient ID: Danny [facility], male    DOB: 1980-09-15, 41 y.o.   MRN: [id number]  HPI: Danny Hinton is a 41 y.o. male presenting on 12/23/2021 for Hematuria  Patient reports he noted dark red blood back in his urine last night.  He also continues to have blood in his semen.  He reports it stings when he urinates.  Reports pressure in his lower left abdomen after ejaculating and with an erection.  Denies any urgency.  He was previously treated for a UTI, after which symptoms resolved.  Previously INR, CBC, PSA, and STI testing were all normal.  He has been referred to urology, and has an upcoming appointment on Monday.   ROS: Negative unless specifically indicated above in HPI.   Relevant past medical history reviewed and updated as indicated.   Allergies and medications reviewed and updated.   Current Outpatient Medications:    atorvastatin (LIPITOR) 40 MG tablet, Take 1 tablet (40 mg total) by mouth daily., Disp: 90 tablet, Rfl: 0   carvedilol (COREG) 25 MG tablet, Take 0.5 tablets (12.5 mg total) by mouth 2 (two) times daily with a meal., Disp: 90 tablet, Rfl: 3   ENTRESTO 49-51 MG, Take 1 tablet by mouth twice daily, Disp: 180 tablet, Rfl: 0   FLUoxetine (PROZAC) 20 MG tablet, Take 20 mg by mouth daily., Disp: , Rfl:    fluticasone (FLONASE) 50 MCG/ACT nasal spray, Place 1 spray into both nostrils as needed., Disp: 16 g, Rfl: 11   furosemide (LASIX) 40 MG tablet, Take 40 mg by mouth as needed., Disp: , Rfl:    loperamide (IMODIUM A-D) 2 MG tablet, Take as needed, Disp: 30 tablet, Rfl:  0   loratadine (CLARITIN) 10 MG tablet, Take 1 tablet (10 mg total) by mouth daily in the afternoon. (Patient not taking: Reported on 12/14/2021), Disp: 30 tablet, Rfl: 1   loratadine (EQ ALLERGY RELIEF) 10 MG tablet, Take 1 tablet by mouth once daily, Disp: 30 tablet, Rfl: 0   metoprolol tartrate (LOPRESSOR) 25 MG tablet, Take 1 tablet as needed for palpitations (Patient not taking: Reported on 12/14/2021), Disp: 90 tablet, Rfl: 0   Multiple Vitamin (MULTIVITAMIN WITH MINERALS) TABS tablet, Take 1 tablet by mouth daily., Disp: , Rfl:    ondansetron (ZOFRAN) 4 MG tablet, Take 1 tablet (4 mg total) by mouth every 8 (eight) hours as needed for nausea or vomiting. (Patient not taking: Reported on 12/14/2021), Disp: 20 tablet, Rfl: 0   Probiotic Product (VSL#3) CAPS, Take twice a day, Disp:  , Rfl:    Vitamin D, Ergocalciferol, (DRISDOL) 1.25 MG (50000 UNIT) CAPS capsule, Take 1 capsule (50,000 Units total) by mouth every 7 (seven) days. (Patient taking differently: Take 50,000 Units by mouth 2 (two) times a week. Tues and Thurs), Disp: 12 capsule, Rfl: 0   warfarin (COUMADIN) 5 MG tablet, Take 1 - 1 1/2 tablets daily or as directed by coumadin clinic, Disp: 135 tablet, Rfl: 1  No Known Allergies  Objective:   BP 104/63   Pulse (!) 58   Temp  97.8 F (36.6 C) (Temporal)   Ht 6\' 4"  (1.93 m)   Wt 244 lb 3.2 oz (110.8 kg)   SpO2 96%   BMI 29.72 kg/m    Physical Exam Vitals reviewed.  Constitutional:      General: He is not in acute distress.    Appearance: Normal appearance. He is not ill-appearing, toxic-appearing or diaphoretic.  HENT:     Head: Normocephalic and atraumatic.  Eyes:     General: No scleral icterus.       Right eye: No discharge.        Left eye: No discharge.     Conjunctiva/sclera: Conjunctivae normal.  Cardiovascular:     Rate and Rhythm: Normal rate.  Pulmonary:     Effort: Pulmonary effort is normal. No respiratory distress.  Musculoskeletal:        General: Normal  range of motion.     Cervical back: Normal range of motion.  Skin:    General: Skin is warm and dry.  Neurological:     Mental Status: He is alert and oriented to person, place, and time. Mental status is at baseline.  Psychiatric:        Mood and Affect: Mood normal.        Behavior: Behavior normal.        Thought Content: Thought content normal.        Judgment: Judgment normal.

## 2021-12-24 ENCOUNTER — Ambulatory Visit: Payer: Medicaid Other | Admitting: Family Medicine

## 2021-12-28 ENCOUNTER — Encounter: Payer: Self-pay | Admitting: Urology

## 2021-12-28 ENCOUNTER — Ambulatory Visit (INDEPENDENT_AMBULATORY_CARE_PROVIDER_SITE_OTHER): Payer: Medicaid Other | Admitting: Urology

## 2021-12-28 ENCOUNTER — Ambulatory Visit (HOSPITAL_COMMUNITY): Payer: Medicaid Other | Attending: Cardiovascular Disease

## 2021-12-28 VITALS — BP 106/70 | HR 76

## 2021-12-28 DIAGNOSIS — I5042 Chronic combined systolic (congestive) and diastolic (congestive) heart failure: Secondary | ICD-10-CM | POA: Diagnosis present

## 2021-12-28 DIAGNOSIS — R310 Gross hematuria: Secondary | ICD-10-CM | POA: Diagnosis not present

## 2021-12-28 DIAGNOSIS — R361 Hematospermia: Secondary | ICD-10-CM | POA: Diagnosis not present

## 2021-12-28 LAB — MICROSCOPIC EXAMINATION
Bacteria, UA: NONE SEEN
RBC, Urine: >30 /hpf — AB (ref 0–2)
Renal Epithel, UA: NONE SEEN /hpf
WBC, UA: NONE SEEN /hpf (ref 0–5)

## 2021-12-28 LAB — ECHOCARDIOGRAM COMPLETE
Area-P 1/2: 2.56 cm2
S' Lateral: 4.9 cm

## 2021-12-28 LAB — URINALYSIS, ROUTINE W REFLEX MICROSCOPIC
Bilirubin, UA: NEGATIVE
Glucose, UA: NEGATIVE
Leukocytes,UA: NEGATIVE
Nitrite, UA: NEGATIVE
Specific Gravity, UA: 1.02 (ref 1.005–1.030)
Urobilinogen, Ur: 0.2 mg/dL (ref 0.2–1.0)
pH, UA: 6.5 (ref 5.0–7.5)

## 2021-12-28 NOTE — Progress Notes (Unsigned)
12/28/2021 11:33 AM   Danny [facility] 1980-05-21 [id number]  Referring provider: Gwenlyn Fudge, FNP [address] [place],  [place] [address]  No chief complaint on file.   HPI:  New pt -   1) gross hematuria - he noted red urine and small clots. He had dysuria. Jul - Aug 2023 labs: UA showed > 30 rbc and then 11-30 rbc. Urine cx grew e coli and then mixed. Abx helped. PSA 0.6. Cr 0.7.   Non-smoker. No other exposures. No GU hx or surgery.  H/o TAA, CHF and afib. On blood thinners. He had gastric bypass in 2022 and lost 245 lbs. He raises beagles and bassett hounds.   He voids with a good stream and no frequency.   Today, seen for the above. No dysuria today. UA today with > 30 rbc. No bacteria.    PMH: Past Medical History:  Diagnosis Date   Anxiety    Atypical atrial flutter (HCC) 08/19/2014   Pace terminated in device clinic.  AFL cycle length was 390 msec.   Automatic implantable cardioverter-defibrillator in situ    a. St Jude in 2011.   B12 nutritional deficiency, B12 = 214 (03/18/19) 04/29/2019   Chronic anticoagulation with Coumadin    Chronic systolic congestive heart failure (HCC)    a. suspected NICM - dx 2010. EF 15% by TEE, 10-20% by echo at that time. b. s/p St. Jude AICD 2011. c. Echo 01/2010: mod dilated LV, EF 30%, mod aortic root dilitation, no significant valvular disease.   Depression    Diabetes mellitus (HCC), Rx Victoza 04/29/2019   Dyslipidemia associated with type 2 diabetes mellitus (HCC), Rx Lipitor 04/29/2019   Eczema    Enlarged aorta (HCC)    Hypertension associated with diabetes (HCC)    Mobitz type 2 second degree atrioventricular block    a. During sleeping hours in 2010 suspected due to OSA.   Morbid obesity (HCC)    Nonischemic cardiomyopathy (HCC)    OSA treated with BiPAP 09/04/2018   Severe obstructive sleep apnea with an AHI of 67.2/h and nocturnal hypoxemia with oxygen saturations as low as 82%. Now on BIPAP at 18/14 cm H2O.    PAF (paroxysmal atrial fibrillation) (HCC)    Palpitations    Paroxysmal atrial flutter (HCC), s/p ablation    Seasonal allergies    Sleep apnea    Thoracic aortic aneurysm (HCC), monitored annually 04/29/2019   Vitamin D deficiency, vitamin D = 6.9 (03/28/19) 04/29/2019    Surgical History: Past Surgical History:  Procedure Laterality Date   ATRIAL FLUTTER ABLATION N/A 01/18/2014   Procedure: ATRIAL FLUTTER ABLATION;  Surgeon: Duke Salvia, MD;  Location: [facility] CATH LAB;  Service: Cardiovascular;  Laterality: N/A;   CARDIAC DEFIBRILLATOR PLACEMENT  02/17/10   St. Jude Medical 45DR, model number [id number], serial number [id number]   CARDIOVERSION N/A 12/29/2013   Procedure: CARDIOVERSION;  Surgeon: Duke Salvia, MD;  Location: [facility] OR;  Service: Cardiovascular;  Laterality: N/A;   ICD GENERATOR CHANGEOUT N/A 03/24/2020   Procedure: ICD GENERATOR CHANGEOUT;  Surgeon: Thurmon Fair, MD;  Location: [facility] INVASIVE CV LAB;  Service: Cardiovascular;  Laterality: N/A;   TOOTH EXTRACTION N/A 10/23/2012   Procedure: EXTRACTION TEETH 1, 16, 17, 30, 31;  Surgeon: [name], DDS;  Location: [facility] OR;  Service: Oral Surgery;  Laterality: N/A;    Home Medications:  Allergies as of 12/28/2021   No Known Allergies      Medication List  Accurate as of December 28, 2021 11:33 AM. If you have any questions, ask your nurse or doctor.          atorvastatin 40 MG tablet Commonly known as: LIPITOR Take 1 tablet (40 mg total) by mouth daily.   carvedilol 25 MG tablet Commonly known as: COREG Take 0.5 tablets (12.5 mg total) by mouth 2 (two) times daily with a meal.   Entresto 49-51 MG Generic drug: sacubitril-valsartan Take 1 tablet by mouth twice daily   FLUoxetine 20 MG tablet Commonly known as: PROZAC Take 20 mg by mouth daily.   fluticasone 50 MCG/ACT nasal spray Commonly known as: FLONASE Place 1 spray into both nostrils as needed.   furosemide 40 MG tablet Commonly known as:  LASIX Take 40 mg by mouth as needed.   loperamide 2 MG tablet Commonly known as: Imodium A-D Take as needed   loratadine 10 MG tablet Commonly known as: EQ Allergy Relief Take 1 tablet by mouth once daily   loratadine 10 MG tablet Commonly known as: CLARITIN Take 1 tablet (10 mg total) by mouth daily in the afternoon.   metoprolol tartrate 25 MG tablet Commonly known as: LOPRESSOR Take 1 tablet as needed for palpitations   multivitamin with minerals Tabs tablet Take 1 tablet by mouth daily.   ondansetron 4 MG tablet Commonly known as: Zofran Take 1 tablet (4 mg total) by mouth every 8 (eight) hours as needed for nausea or vomiting.   Vitamin D (Ergocalciferol) 1.25 MG (50000 UNIT) Caps capsule Commonly known as: DRISDOL Take 1 capsule (50,000 Units total) by mouth every 7 (seven) days. What changed:  when to take this additional instructions   VSL#3 Caps Take twice a day   warfarin 5 MG tablet Commonly known as: COUMADIN Take as directed by the anticoagulation clinic. If you are unsure how to take this medication, talk to your nurse or doctor. Original instructions: Take 1 - 1 1/2 tablets daily or as directed by coumadin clinic        Allergies: No Known Allergies  Family History: Family History  Problem Relation Age of Onset   Hodgkin's lymphoma Brother 19   Heart disease Mother    Depression Mother    Anxiety disorder Mother    Arrhythmia Mother    Colon polyps Father    Heart disease Maternal Grandfather    Diabetes Paternal Grandmother    Irritable bowel syndrome Maternal Aunt        2 or 3 mat aunts have IBS    Social History:  reports that he has never smoked. He has never used smokeless tobacco. He reports that he does not drink alcohol and does not use drugs.   Physical Exam: BP 106/70   Pulse 76   Constitutional:  Alert and oriented, No acute distress. HEENT: [place] AT, moist mucus membranes.  Trachea midline, no masses. Cardiovascular: No  clubbing, cyanosis, or edema. Respiratory: Normal respiratory effort, no increased work of breathing. GI: Abdomen is soft, nontender, nondistended, no abdominal masses GU: No CVA tenderness Lymph: No cervical or inguinal lymphadenopathy. Skin: No rashes, bruises or suspicious lesions. Neurologic: Grossly intact, no focal deficits, moving all 4 extremities. Psychiatric: Normal mood and affect. GU: Penis circumcised, normal foreskin, testicles descended bilaterally and palpably normal, bilateral epididymis palpably normal, scrotum normal DRE: Prostate 25 g, smooth without hard area or nodule   Laboratory Data: Lab Results  Component Value Date   WBC 3.3 (L) 12/11/2021   HGB 13.1 12/11/2021  HCT 40.6 12/11/2021   MCV 84 12/11/2021   PLT 230 12/11/2021    Lab Results  Component Value Date   CREATININE 0.74 (L) 09/14/2021    No results found for: "PSA"  No results found for: "TESTOSTERONE"  Lab Results  Component Value Date   HGBA1C 5.3 06/04/2021    Urinalysis    Component Value Date/Time   COLORURINE YELLOW 12/28/2013 0630   APPEARANCEUR Clear 12/23/2021 1608   LABSPEC 1.032 (H) 12/28/2013 0630   PHURINE 6.0 12/28/2013 0630   GLUCOSEU Negative 12/23/2021 1608   HGBUR MODERATE (A) 12/28/2013 0630   BILIRUBINUR Negative 12/23/2021 1608   KETONESUR NEGATIVE 12/28/2013 0630   PROTEINUR Negative 12/23/2021 1608   PROTEINUR NEGATIVE 12/28/2013 0630   UROBILINOGEN 0.2 12/28/2013 0630   NITRITE Negative 12/23/2021 1608   NITRITE NEGATIVE 12/28/2013 0630   LEUKOCYTESUR Negative 12/23/2021 1608    Lab Results  Component Value Date   LABMICR See below: 12/23/2021   WBCUA 0-5 12/23/2021   LABEPIT 0-10 12/23/2021   MUCUS Present (A) 12/11/2021   BACTERIA Few (A) 12/23/2021    Pertinent Imaging: Results for orders placed in visit on 12/23/21  DG Abd 1 View  Narrative CLINICAL DATA:  Blood in urine  EXAM: ABDOMEN - 1 VIEW  COMPARISON:   None  FINDINGS: Normal bowel gas pattern.  Scattered pelvic phleboliths.  No definite urinary tract calcification.  Osseous structures unremarkable.  IMPRESSION: No acute abnormalities.   Electronically Signed By: Ulyses Southward M.D. On: 12/23/2021 16:11     Assessment & Plan:    1. Blood in semen Typically benign. PSA and exam benign  - Urinalysis, Routine w reflex microscopic  2. Gross hematuria - disc nature r/b/a to CT and cystoscopy and he elects to proceed.   No follow-ups on file.  Jerilee Field, MD  [facility]  [address] [place], [place] [address] [phone]

## 2021-12-29 ENCOUNTER — Encounter: Payer: Self-pay | Admitting: Family Medicine

## 2022-01-19 ENCOUNTER — Ambulatory Visit: Payer: Medicaid Other | Attending: Cardiovascular Disease | Admitting: *Deleted

## 2022-01-19 DIAGNOSIS — Z5181 Encounter for therapeutic drug level monitoring: Secondary | ICD-10-CM | POA: Diagnosis not present

## 2022-01-19 DIAGNOSIS — I480 Paroxysmal atrial fibrillation: Secondary | ICD-10-CM | POA: Diagnosis not present

## 2022-01-19 LAB — POCT INR: INR: 3.1 — AB (ref 2.0–3.0)

## 2022-01-19 NOTE — Patient Instructions (Signed)
Decrease warfarin to 1 1/2 tablets daily except 1 tablet on Sundays, Tuesdays and Thursdays. Recheck 6 wks

## 2022-01-22 NOTE — Progress Notes (Signed)
Remote ICD transmission.   

## 2022-02-02 ENCOUNTER — Other Ambulatory Visit: Payer: Medicaid Other

## 2022-02-03 ENCOUNTER — Telehealth: Payer: Self-pay

## 2022-02-03 NOTE — Telephone Encounter (Signed)
Patient called today to see if he needed to keep his apt for his CT imaging.  He states that his symptoms have cleared up for a week now.  Looks like he did not make the apt to have his imaging done.  Do you want to keep the apt for the Cysto on 10/02 or change to OV?  Please advise.

## 2022-02-08 ENCOUNTER — Telehealth: Payer: Self-pay

## 2022-02-08 ENCOUNTER — Other Ambulatory Visit: Payer: Medicaid Other | Admitting: Urology

## 2022-02-08 NOTE — Telephone Encounter (Addendum)
Patient aware of MD response, he will r/s his CT.  Rescheduled his cysto apt with Dr. Junious Silk until 11/06, patient notified and reminder letter mailed.       Festus Aloe, MD  You 14 hours ago (7:48 PM)    Yes, he needs to keep appt for CT scan as recommended. It's very important to scan the abdomen and pelvis after seeing blood in the urine. Please reschedule his cystoscopy to after the CT scan. Thank you.

## 2022-03-01 ENCOUNTER — Other Ambulatory Visit: Payer: Self-pay | Admitting: Cardiovascular Disease

## 2022-03-02 ENCOUNTER — Ambulatory Visit: Payer: Medicare Other | Attending: Cardiovascular Disease | Admitting: *Deleted

## 2022-03-02 DIAGNOSIS — Z5181 Encounter for therapeutic drug level monitoring: Secondary | ICD-10-CM

## 2022-03-02 DIAGNOSIS — I480 Paroxysmal atrial fibrillation: Secondary | ICD-10-CM

## 2022-03-02 LAB — POCT INR: INR: 3.2 — AB (ref 2.0–3.0)

## 2022-03-02 NOTE — Patient Instructions (Signed)
Take warfarin 1/2 tablet tonight then decrease dose to 1 tablet daily except 1 1/2 tablets on Mondays, Wednesdays and Fridays. Recheck 6 wks

## 2022-03-15 ENCOUNTER — Other Ambulatory Visit: Payer: Medicaid Other | Admitting: Urology

## 2022-03-23 ENCOUNTER — Ambulatory Visit (INDEPENDENT_AMBULATORY_CARE_PROVIDER_SITE_OTHER): Payer: Medicaid Other

## 2022-03-23 DIAGNOSIS — I428 Other cardiomyopathies: Secondary | ICD-10-CM

## 2022-03-23 LAB — CUP PACEART REMOTE DEVICE CHECK
Battery Remaining Longevity: 83 mo
Battery Remaining Percentage: 80 %
Battery Voltage: 2.99 V
Brady Statistic AP VP Percent: 1 %
Brady Statistic AP VS Percent: 7.2 %
Brady Statistic AS VP Percent: 1 %
Brady Statistic AS VS Percent: 92 %
Brady Statistic RA Percent Paced: 6.6 %
Brady Statistic RV Percent Paced: 1 %
Date Time Interrogation Session: 20231114010028
HighPow Impedance: 35 Ohm
Implantable Lead Connection Status: 753985
Implantable Lead Connection Status: 753985
Implantable Lead Implant Date: 20111010
Implantable Lead Implant Date: 20111010
Implantable Lead Location: 753859
Implantable Lead Location: 753860
Implantable Pulse Generator Implant Date: 20211115
Lead Channel Impedance Value: 380 Ohm
Lead Channel Impedance Value: 400 Ohm
Lead Channel Pacing Threshold Amplitude: 0.75 V
Lead Channel Pacing Threshold Amplitude: 1.25 V
Lead Channel Pacing Threshold Pulse Width: 0.5 ms
Lead Channel Pacing Threshold Pulse Width: 1 ms
Lead Channel Sensing Intrinsic Amplitude: 11.7 mV
Lead Channel Sensing Intrinsic Amplitude: 2.4 mV
Lead Channel Setting Pacing Amplitude: 2.5 V
Lead Channel Setting Pacing Amplitude: 2.5 V
Lead Channel Setting Pacing Pulse Width: 0.5 ms
Lead Channel Setting Sensing Sensitivity: 0.5 mV
Pulse Gen Serial Number: 111031056
Zone Setting Status: 755011

## 2022-03-31 ENCOUNTER — Other Ambulatory Visit: Payer: Self-pay | Admitting: Cardiovascular Disease

## 2022-04-13 ENCOUNTER — Ambulatory Visit: Payer: Medicare Other | Attending: Cardiovascular Disease | Admitting: *Deleted

## 2022-04-13 DIAGNOSIS — Z5181 Encounter for therapeutic drug level monitoring: Secondary | ICD-10-CM

## 2022-04-13 DIAGNOSIS — I480 Paroxysmal atrial fibrillation: Secondary | ICD-10-CM | POA: Diagnosis not present

## 2022-04-13 LAB — POCT INR: INR: 3.3 — AB (ref 2.0–3.0)

## 2022-04-13 NOTE — Patient Instructions (Signed)
Hold warfarin tonight then decrease dose to 1 tablet daily except 1 1/2 tablets on Mondays. Recheck 6 wks

## 2022-04-20 NOTE — Progress Notes (Signed)
Remote ICD transmission.   

## 2022-04-28 ENCOUNTER — Encounter: Payer: Self-pay | Admitting: Family Medicine

## 2022-04-28 ENCOUNTER — Telehealth: Payer: Self-pay | Admitting: Family Medicine

## 2022-04-28 ENCOUNTER — Telehealth (INDEPENDENT_AMBULATORY_CARE_PROVIDER_SITE_OTHER): Payer: Medicare Other | Admitting: Family Medicine

## 2022-04-28 DIAGNOSIS — J111 Influenza due to unidentified influenza virus with other respiratory manifestations: Secondary | ICD-10-CM

## 2022-04-28 MED ORDER — OSELTAMIVIR PHOSPHATE 75 MG PO CAPS: 75.0000 mg | ORAL_CAPSULE | Freq: Two times a day (BID) | ORAL | 0 refills | Status: AC

## 2022-04-28 NOTE — Progress Notes (Signed)
Virtual Visit via MyChart Video Note Due to COVID-19 pandemic this visit was conducted virtually. This visit type was conducted due to national recommendations for restrictions regarding the COVID-19 Pandemic (e.g. social distancing, sheltering in place) in an effort to limit this patient's exposure and mitigate transmission in our community. All issues noted in this document were discussed and addressed.  A physical exam was not performed with this format.   I connected with Danny Hinton on 04/28/2022 at 1100 by MyChart Video and verified that I am speaking with the correct person using two identifiers. Danny Hinton is currently located at home and family is currently with them during visit. The provider, Kari Baars, FNP is located in their office at time of visit.  I discussed the limitations, risks, security and privacy concerns of performing an evaluation and management service by virtual visit and the availability of in person appointments. I also discussed with the patient that there may be a patient responsible charge related to this service. The patient expressed understanding and agreed to proceed.  Subjective:  Patient ID: Danny Hinton, male    DOB: 07-Nov-1980, 41 y.o.   MRN: [id number]  Chief Complaint:  Influenza   HPI: Danny Hinton is a 41 y.o. male presenting on 04/28/2022 for Influenza   Pt reports onset of cough, congestion, fever, chills, and myalgias yesterday. Daughter was seen yesterday and diagnosed with influenza.   Influenza This is a new problem. The current episode started yesterday. The problem has been gradually worsening. Associated symptoms include chills, congestion, coughing, fatigue, a fever, myalgias and a sore throat. Pertinent negatives include no abdominal pain, anorexia, arthralgias, change in bowel habit, chest pain, diaphoresis, headaches, joint swelling, nausea, neck pain, numbness, rash, swollen glands, urinary symptoms, vertigo, visual change, vomiting  or weakness. Nothing aggravates the symptoms. He has tried acetaminophen (Mucinex and cold medicaitons) for the symptoms. The treatment provided mild relief.     Relevant past medical, surgical, family, and social history reviewed and updated as indicated.  Allergies and medications reviewed and updated.   Past Medical History:  Diagnosis Date   Anxiety    Atypical atrial flutter (HCC) 08/19/2014   Pace terminated in device clinic.  AFL cycle length was 390 msec.   Automatic implantable cardioverter-defibrillator in situ    a. St Jude in 2011.   B12 nutritional deficiency, B12 = 214 (03/18/19) 04/29/2019   Chronic anticoagulation with Coumadin    Chronic systolic congestive heart failure (HCC)    a. suspected NICM - dx 2010. EF 15% by TEE, 10-20% by echo at that time. b. s/p St. Jude AICD 2011. c. Echo 01/2010: mod dilated LV, EF 30%, mod aortic root dilitation, no significant valvular disease.   Depression    Diabetes mellitus (HCC), Rx Victoza 04/29/2019   Dyslipidemia associated with type 2 diabetes mellitus (HCC), Rx Lipitor 04/29/2019   Eczema    Enlarged aorta (HCC)    Hypertension associated with diabetes (HCC)    Mobitz type 2 second degree atrioventricular block    a. During sleeping hours in 2010 suspected due to OSA.   Morbid obesity (HCC)    Nonischemic cardiomyopathy (HCC)    OSA treated with BiPAP 09/04/2018   Severe obstructive sleep apnea with an AHI of 67.2/h and nocturnal hypoxemia with oxygen saturations as low as 82%. Now on BIPAP at 18/14 cm H2O.   PAF (paroxysmal atrial fibrillation) (HCC)    Palpitations    Paroxysmal atrial flutter (HCC), s/p  ablation    Seasonal allergies    Sleep apnea    Thoracic aortic aneurysm (HCC), monitored annually 04/29/2019   Vitamin D deficiency, vitamin D = 6.9 (03/28/19) 04/29/2019    Past Surgical History:  Procedure Laterality Date   ATRIAL FLUTTER ABLATION N/A 01/18/2014   Procedure: ATRIAL FLUTTER ABLATION;  Surgeon:  Duke Salvia, MD;  Location: [facility] CATH LAB;  Service: Cardiovascular;  Laterality: N/A;   CARDIAC DEFIBRILLATOR PLACEMENT  02/17/10   St. Jude Medical 45DR, model number [id number], serial number [id number]   CARDIOVERSION N/A 12/29/2013   Procedure: CARDIOVERSION;  Surgeon: Duke Salvia, MD;  Location[facility] OR;  Service: Cardiovascular;  Laterality: N/A;   ICD GENERATOR CHANGEOUT N/A 03/24/2020   Procedure: ICD GENERATOR CHANGEOUT;  Surgeon: Thurmon Fair, MD;  Location: [facility] INVASIVE CV LAB;  Service: Cardiovascular;  Laterality: N/A;   TOOTH EXTRACTION N/A 10/23/2012   Procedure: EXTRACTION TEETH 1, 16, 17, 30, 31;  Surgeon: [name], DDS;  Location: [facility] OR;  Service: Oral Surgery;  Laterality: N/A;    Social History   Socioeconomic History   Marital status: Married    Spouse name: Not on file   Number of children: 1   Years of education: Not on file   Highest education level: Not on file  Occupational History   Occupation: disabled  Tobacco Use   Smoking status: Never   Smokeless tobacco: Never  Vaping Use   Vaping Use: Never used  Substance and Sexual Activity   Alcohol use: No   Drug use: No   Sexual activity: Yes  Other Topics Concern   Not on file  Social History Narrative   Not on file   Social Determinants of Health   Financial Resource Strain: Not on file  Food Insecurity: Not on file  Transportation Needs: Not on file  Physical Activity: Not on file  Stress: Not on file  Social Connections: Not on file  Intimate Partner Violence: Not on file    Outpatient Encounter Medications as of 04/28/2022  Medication Sig   oseltamivir (TAMIFLU) 75 MG capsule Take 1 capsule (75 mg total) by mouth 2 (two) times daily for 5 days.   atorvastatin (LIPITOR) 40 MG tablet Take 1 tablet (40 mg total) by mouth daily.   carvedilol (COREG) 25 MG tablet Take 0.5 tablets (12.5 mg total) by mouth 2 (two) times daily with a meal.   ENTRESTO 49-51 MG Take 1 tablet by mouth twice  daily   FLUoxetine (PROZAC) 20 MG tablet Take 20 mg by mouth daily.   fluticasone (FLONASE) 50 MCG/ACT nasal spray Place 1 spray into both nostrils as needed.   furosemide (LASIX) 40 MG tablet Take 40 mg by mouth as needed.   loperamide (IMODIUM A-D) 2 MG tablet Take as needed   loratadine (CLARITIN) 10 MG tablet Take 1 tablet (10 mg total) by mouth daily in the afternoon.   loratadine (EQ ALLERGY RELIEF) 10 MG tablet Take 1 tablet by mouth once daily   metoprolol tartrate (LOPRESSOR) 25 MG tablet Take 1 tablet as needed for palpitations   Multiple Vitamin (MULTIVITAMIN WITH MINERALS) TABS tablet Take 1 tablet by mouth daily.   ondansetron (ZOFRAN) 4 MG tablet Take 1 tablet (4 mg total) by mouth every 8 (eight) hours as needed for nausea or vomiting.   Probiotic Product (VSL#3) CAPS Take twice a day   Vitamin D, Ergocalciferol, (DRISDOL) 1.25 MG (50000 UNIT) CAPS capsule Take 1 capsule (50,000 Units total) by mouth  every 7 (seven) days. (Patient taking differently: Take 50,000 Units by mouth 2 (two) times a week. Tues and Thurs)   warfarin (COUMADIN) 5 MG tablet TAKE 1 TO 1 & 1/2 TABLETS BY MOUTH ONCE DAILY OR  AS  DIRECTED  BY  COUMADIN  CLINIC   No facility-administered encounter medications on file as of 04/28/2022.    No Known Allergies  Review of Systems  Constitutional:  Positive for activity change, appetite change, chills, fatigue and fever. Negative for diaphoresis and unexpected weight change.  HENT:  Positive for congestion, postnasal drip, rhinorrhea and sore throat. Negative for dental problem, drooling, ear discharge, ear pain, facial swelling, hearing loss, mouth sores, nosebleeds, sinus pressure, sinus pain, sneezing, tinnitus, trouble swallowing and voice change.   Eyes:  Negative for photophobia and visual disturbance.  Respiratory:  Positive for cough. Negative for apnea, choking, chest tightness, shortness of breath, wheezing and stridor.   Cardiovascular:  Negative for  chest pain.  Gastrointestinal:  Negative for abdominal pain, anorexia, change in bowel habit, nausea and vomiting.  Genitourinary:  Negative for decreased urine volume and difficulty urinating.  Musculoskeletal:  Positive for myalgias. Negative for arthralgias, joint swelling and neck pain.  Skin:  Negative for rash.  Neurological:  Negative for dizziness, vertigo, tremors, seizures, syncope, facial asymmetry, speech difficulty, weakness, light-headedness, numbness and headaches.  Psychiatric/Behavioral:  Negative for confusion.   All other systems reviewed and are negative.        Observations/Objective: No vital signs or physical exam, this was a virtual health encounter.  Pt alert and oriented, answers all questions appropriately, and able to speak in full sentences. Nasal congestion and rhinorrhea present.    Assessment and Plan: Danny was seen today for influenza.  Diagnoses and all orders for this visit:  Influenza Daughter has influenza and pt has similar symptoms, onset yesterday. Due to comorbidities, will start antiviral therapy. Pt aware to report new, worsening, or persistent symptoms. Continue symptomatic care at home.  -     oseltamivir (TAMIFLU) 75 MG capsule; Take 1 capsule (75 mg total) by mouth 2 (two) times daily for 5 days.     Follow Up Instructions: Return if symptoms worsen or fail to improve.    I discussed the assessment and treatment plan with the patient. The patient was provided an opportunity to ask questions and all were answered. The patient agreed with the plan and demonstrated an understanding of the instructions.   The patient was advised to call back or seek an in-person evaluation if the symptoms worsen or if the condition fails to improve as anticipated.  The above assessment and management plan was discussed with the patient. The patient verbalized understanding of and has agreed to the management plan. Patient is aware to call the clinic if  they develop any new symptoms or if symptoms persist or worsen. Patient is aware when to return to the clinic for a follow-up visit. Patient educated on when it is appropriate to go to the emergency department.    I provided 15 minutes of time during this MyChart Video encounter.   Kari Baars, FNP-C [facility] [address] [place], [place] [address] [phone] 04/28/2022

## 2022-04-28 NOTE — Telephone Encounter (Signed)
I spoke to pt and he says his daughter tested positive for flu yesterday and then pt states he started coughing yesterday and has coughed all night. Pt denies fever, increased SOB, headaches but does say he has noticed his legs ache. Pt was able to get in today with DOD ([facility]) for a televisit. So pt will wait and do televisit.

## 2022-04-29 ENCOUNTER — Telehealth: Payer: Medicaid Other

## 2022-05-17 ENCOUNTER — Other Ambulatory Visit: Payer: Self-pay | Admitting: Family Medicine

## 2022-05-17 ENCOUNTER — Other Ambulatory Visit: Payer: Self-pay | Admitting: Cardiovascular Disease

## 2022-05-17 NOTE — Telephone Encounter (Signed)
Refill request for warfarin:  Last INR was 3.3 on 04/13/22 Next INR due 05/25/22 LOV was 12/14/21  Jerilynn Mages Croitoru MD  Refill approved.

## 2022-05-25 ENCOUNTER — Ambulatory Visit: Payer: Medicare Other | Attending: Cardiovascular Disease | Admitting: *Deleted

## 2022-05-25 DIAGNOSIS — I480 Paroxysmal atrial fibrillation: Secondary | ICD-10-CM

## 2022-05-25 DIAGNOSIS — Z5181 Encounter for therapeutic drug level monitoring: Secondary | ICD-10-CM | POA: Diagnosis not present

## 2022-05-25 LAB — POCT INR: INR: 1.6 — AB (ref 2.0–3.0)

## 2022-05-25 NOTE — Patient Instructions (Signed)
Take warfarin 1 1/2 tablets tonight then increase dose to 1 tablet daily except 1 1/2 tablets on Mondays, Wednesdays and Fridays. Recheck 3 wks

## 2022-06-15 ENCOUNTER — Ambulatory Visit: Payer: Medicare Other | Attending: Cardiovascular Disease | Admitting: *Deleted

## 2022-06-15 DIAGNOSIS — I480 Paroxysmal atrial fibrillation: Secondary | ICD-10-CM | POA: Diagnosis not present

## 2022-06-15 DIAGNOSIS — Z5181 Encounter for therapeutic drug level monitoring: Secondary | ICD-10-CM | POA: Insufficient documentation

## 2022-06-15 LAB — POCT INR: INR: 2 (ref 2.0–3.0)

## 2022-06-15 NOTE — Patient Instructions (Signed)
Continue warfarin 1 tablet daily except 1 1/2 tablets on Mondays, Wednesdays and Fridays. Recheck 4 wks

## 2022-06-22 ENCOUNTER — Ambulatory Visit: Payer: Medicare Other

## 2022-06-22 DIAGNOSIS — I428 Other cardiomyopathies: Secondary | ICD-10-CM | POA: Diagnosis not present

## 2022-06-22 LAB — CUP PACEART REMOTE DEVICE CHECK
Battery Remaining Longevity: 81 mo
Battery Remaining Percentage: 77 %
Battery Voltage: 2.99 V
Brady Statistic AP VP Percent: 1 %
Brady Statistic AP VS Percent: 7.3 %
Brady Statistic AS VP Percent: 1 %
Brady Statistic AS VS Percent: 92 %
Brady Statistic RA Percent Paced: 6.6 %
Brady Statistic RV Percent Paced: 1 %
Date Time Interrogation Session: 20240212210219
HighPow Impedance: 39 Ohm
Implantable Lead Connection Status: 753985
Implantable Lead Connection Status: 753985
Implantable Lead Implant Date: 20111010
Implantable Lead Implant Date: 20111010
Implantable Lead Location: 753859
Implantable Lead Location: 753860
Implantable Pulse Generator Implant Date: 20211115
Lead Channel Impedance Value: 390 Ohm
Lead Channel Impedance Value: 430 Ohm
Lead Channel Pacing Threshold Amplitude: 0.75 V
Lead Channel Pacing Threshold Amplitude: 1.25 V
Lead Channel Pacing Threshold Pulse Width: 0.5 ms
Lead Channel Pacing Threshold Pulse Width: 1 ms
Lead Channel Sensing Intrinsic Amplitude: 11.7 mV
Lead Channel Sensing Intrinsic Amplitude: 3.5 mV
Lead Channel Setting Pacing Amplitude: 2.5 V
Lead Channel Setting Pacing Amplitude: 2.5 V
Lead Channel Setting Pacing Pulse Width: 0.5 ms
Lead Channel Setting Sensing Sensitivity: 0.5 mV
Pulse Gen Serial Number: 111031056
Zone Setting Status: 755011

## 2022-07-26 NOTE — Progress Notes (Signed)
Remote ICD transmission.   

## 2022-07-27 ENCOUNTER — Ambulatory Visit: Payer: Medicare Other | Attending: Cardiovascular Disease | Admitting: *Deleted

## 2022-07-27 DIAGNOSIS — Z5181 Encounter for therapeutic drug level monitoring: Secondary | ICD-10-CM | POA: Diagnosis not present

## 2022-07-27 DIAGNOSIS — I480 Paroxysmal atrial fibrillation: Secondary | ICD-10-CM | POA: Insufficient documentation

## 2022-07-27 LAB — POCT INR: INR: 2.8 (ref 2.0–3.0)

## 2022-07-27 NOTE — Patient Instructions (Signed)
Continue warfarin 1 tablet daily except 1 1/2 tablets on Mondays, Wednesdays and Fridays. Recheck 4 wks

## 2022-08-05 ENCOUNTER — Other Ambulatory Visit: Payer: Self-pay | Admitting: Cardiovascular Disease

## 2022-08-11 DIAGNOSIS — K912 Postsurgical malabsorption, not elsewhere classified: Secondary | ICD-10-CM | POA: Diagnosis not present

## 2022-08-11 DIAGNOSIS — Z9884 Bariatric surgery status: Secondary | ICD-10-CM | POA: Diagnosis not present

## 2022-08-24 ENCOUNTER — Ambulatory Visit: Payer: Medicare Other | Attending: Cardiovascular Disease | Admitting: *Deleted

## 2022-08-24 DIAGNOSIS — Z5181 Encounter for therapeutic drug level monitoring: Secondary | ICD-10-CM | POA: Diagnosis not present

## 2022-08-24 DIAGNOSIS — I480 Paroxysmal atrial fibrillation: Secondary | ICD-10-CM | POA: Insufficient documentation

## 2022-08-24 LAB — POCT INR: INR: 2.4 (ref 2.0–3.0)

## 2022-08-24 NOTE — Patient Instructions (Signed)
Continue warfarin 1 tablet daily except 1 1/2 tablets on Mondays, Wednesdays and Fridays. Recheck 6 wks

## 2022-08-25 DIAGNOSIS — I10 Essential (primary) hypertension: Secondary | ICD-10-CM | POA: Diagnosis not present

## 2022-08-25 DIAGNOSIS — E119 Type 2 diabetes mellitus without complications: Secondary | ICD-10-CM | POA: Diagnosis not present

## 2022-08-25 DIAGNOSIS — Z9884 Bariatric surgery status: Secondary | ICD-10-CM | POA: Diagnosis not present

## 2022-08-25 DIAGNOSIS — Z6829 Body mass index (BMI) 29.0-29.9, adult: Secondary | ICD-10-CM | POA: Diagnosis not present

## 2022-08-25 DIAGNOSIS — I4891 Unspecified atrial fibrillation: Secondary | ICD-10-CM | POA: Diagnosis not present

## 2022-08-25 DIAGNOSIS — E559 Vitamin D deficiency, unspecified: Secondary | ICD-10-CM | POA: Diagnosis not present

## 2022-08-25 DIAGNOSIS — I509 Heart failure, unspecified: Secondary | ICD-10-CM | POA: Diagnosis not present

## 2022-08-25 DIAGNOSIS — G4733 Obstructive sleep apnea (adult) (pediatric): Secondary | ICD-10-CM | POA: Diagnosis not present

## 2022-08-25 DIAGNOSIS — Z1330 Encounter for screening examination for mental health and behavioral disorders, unspecified: Secondary | ICD-10-CM | POA: Diagnosis not present

## 2022-08-25 DIAGNOSIS — Z09 Encounter for follow-up examination after completed treatment for conditions other than malignant neoplasm: Secondary | ICD-10-CM | POA: Diagnosis not present

## 2022-08-25 DIAGNOSIS — I4892 Unspecified atrial flutter: Secondary | ICD-10-CM | POA: Diagnosis not present

## 2022-08-25 DIAGNOSIS — E782 Mixed hyperlipidemia: Secondary | ICD-10-CM | POA: Diagnosis not present

## 2022-09-06 ENCOUNTER — Telehealth: Payer: Self-pay | Admitting: Cardiovascular Disease

## 2022-09-06 NOTE — Telephone Encounter (Signed)
Patient stated he has a question to ask RN Misty Stanley in the [facility] clinic regarding what they last discussed.

## 2022-09-06 NOTE — Telephone Encounter (Signed)
Called pt.  This was a personal question not related to his health.

## 2022-09-07 DIAGNOSIS — E559 Vitamin D deficiency, unspecified: Secondary | ICD-10-CM | POA: Diagnosis not present

## 2022-09-07 DIAGNOSIS — Z9884 Bariatric surgery status: Secondary | ICD-10-CM | POA: Diagnosis not present

## 2022-09-07 DIAGNOSIS — M8588 Other specified disorders of bone density and structure, other site: Secondary | ICD-10-CM | POA: Diagnosis not present

## 2022-09-07 DIAGNOSIS — Z09 Encounter for follow-up examination after completed treatment for conditions other than malignant neoplasm: Secondary | ICD-10-CM | POA: Diagnosis not present

## 2022-09-09 ENCOUNTER — Other Ambulatory Visit: Payer: Self-pay | Admitting: Family Medicine

## 2022-09-11 ENCOUNTER — Other Ambulatory Visit: Payer: Self-pay | Admitting: Family Medicine

## 2022-09-11 ENCOUNTER — Other Ambulatory Visit: Payer: Self-pay | Admitting: Cardiovascular Disease

## 2022-09-11 DIAGNOSIS — I4719 Other supraventricular tachycardia: Secondary | ICD-10-CM

## 2022-09-13 ENCOUNTER — Telehealth: Payer: Self-pay | Admitting: Cardiovascular Disease

## 2022-09-13 MED ORDER — FUROSEMIDE 40 MG PO TABS: 40.0000 mg | ORAL_TABLET | ORAL | 0 refills | Status: DC | PRN

## 2022-09-13 MED ORDER — ENTRESTO 49-51 MG PO TABS: 1.0000 | ORAL_TABLET | Freq: Two times a day (BID) | ORAL | 0 refills | Status: DC

## 2022-09-13 NOTE — Telephone Encounter (Signed)
*  STAT* If patient is at the pharmacy, call can be transferred to refill team.   1. Which medications need to be refilled? (please list name of each medication and dose if known)   furosemide (LASIX) 40 MG tablet  ENTRESTO 49-51 MG   2. Which pharmacy/location (including street and city if local pharmacy) is medication to be sent to?   [id number] - [place], [place] - [address]    3. Do they need a 30 day or 90 day supply? 90

## 2022-09-15 ENCOUNTER — Ambulatory Visit (INDEPENDENT_AMBULATORY_CARE_PROVIDER_SITE_OTHER): Payer: Medicare Other | Admitting: Family Medicine

## 2022-09-15 ENCOUNTER — Encounter: Payer: Self-pay | Admitting: Family Medicine

## 2022-09-15 VITALS — BP 97/65 | HR 60 | Temp 97.8°F | Ht 76.0 in | Wt 235.1 lb

## 2022-09-15 DIAGNOSIS — R300 Dysuria: Secondary | ICD-10-CM

## 2022-09-15 LAB — URINALYSIS, ROUTINE W REFLEX MICROSCOPIC
Bilirubin, UA: NEGATIVE
Glucose, UA: NEGATIVE
Ketones, UA: NEGATIVE
Leukocytes,UA: NEGATIVE
Nitrite, UA: NEGATIVE
Protein,UA: NEGATIVE
RBC, UA: NEGATIVE
Specific Gravity, UA: 1.02 (ref 1.005–1.030)
Urobilinogen, Ur: 2 mg/dL — ABNORMAL HIGH (ref 0.2–1.0)
pH, UA: 7 (ref 5.0–7.5)

## 2022-09-15 NOTE — Progress Notes (Signed)
   Acute Office Visit  Subjective:     Patient ID: Danny Hinton, male    DOB: Feb 21, 1981, 42 y.o.   MRN: [id number]  Chief Complaint  Patient presents with   Dysuria    Dysuria  This is a new problem. Episode onset: 4 days. The problem occurs intermittently. The problem has been unchanged. The quality of the pain is described as burning. The patient is experiencing no pain. There has been no fever. He is Sexually active. There is No history of pyelonephritis. Pertinent negatives include no chills, discharge, flank pain, frequency, hematuria, hesitancy, nausea, sweats, urgency or vomiting. He has tried nothing for the symptoms.   Denies concerns for STIs. He has had UTIs in the past. He has not been drinking much water lately and has mostly been drinking orange juice.   Review of Systems  Constitutional:  Negative for chills.  Gastrointestinal:  Negative for nausea and vomiting.  Genitourinary:  Positive for dysuria. Negative for flank pain, frequency, hematuria, hesitancy and urgency.        Objective:    BP 97/65   Pulse 60   Temp 97.8 F (36.6 C) (Temporal)   Ht 6\' 4"  (1.93 m)   Wt 235 lb 2 oz (106.7 kg)   SpO2 100%   BMI 28.62 kg/m    Physical Exam Vitals and nursing note reviewed.  Constitutional:      General: He is not in acute distress.    Appearance: Normal appearance. He is not ill-appearing, toxic-appearing or diaphoretic.  Pulmonary:     Effort: Pulmonary effort is normal. No respiratory distress.  Abdominal:     General: Bowel sounds are normal. There is no distension.     Palpations: Abdomen is soft.     Tenderness: There is no abdominal tenderness. There is no right CVA tenderness, left CVA tenderness, guarding or rebound.  Musculoskeletal:     Right lower leg: No edema.     Left lower leg: No edema.  Skin:    General: Skin is warm and dry.  Neurological:     General: No focal deficit present.     Mental Status: He is alert and oriented to person,  place, and time.  Psychiatric:        Mood and Affect: Mood normal.        Behavior: Behavior normal.     No results found for any visits on 09/15/22.      Assessment & Plan:   Danny was seen today for dysuria.  Diagnoses and all orders for this visit:  Dysuria Intermittent. UA negative. Culture pending. Denies concerns for STIs. Increase hydration. Return to office for new or worsening symptoms, or if symptoms persist.  -     Urinalysis, Routine w reflex microscopic -     Urine Culture  The patient indicates understanding of these issues and agrees with the plan.   Gabriel Earing, FNP

## 2022-09-19 LAB — URINE CULTURE

## 2022-09-20 LAB — URINE CULTURE

## 2022-09-21 ENCOUNTER — Ambulatory Visit (INDEPENDENT_AMBULATORY_CARE_PROVIDER_SITE_OTHER): Payer: Medicare Other

## 2022-09-21 DIAGNOSIS — I5042 Chronic combined systolic (congestive) and diastolic (congestive) heart failure: Secondary | ICD-10-CM

## 2022-09-21 DIAGNOSIS — I428 Other cardiomyopathies: Secondary | ICD-10-CM | POA: Diagnosis not present

## 2022-09-22 LAB — CUP PACEART REMOTE DEVICE CHECK
Battery Remaining Longevity: 79 mo
Battery Remaining Percentage: 76 %
Battery Voltage: 2.99 V
Brady Statistic AP VP Percent: 1 %
Brady Statistic AP VS Percent: 9.1 %
Brady Statistic AS VP Percent: 1 %
Brady Statistic AS VS Percent: 91 %
Brady Statistic RA Percent Paced: 8.2 %
Brady Statistic RV Percent Paced: 1 %
Date Time Interrogation Session: 20240513220228
HighPow Impedance: 37 Ohm
Implantable Lead Connection Status: 753985
Implantable Lead Connection Status: 753985
Implantable Lead Implant Date: 20111010
Implantable Lead Implant Date: 20111010
Implantable Lead Location: 753859
Implantable Lead Location: 753860
Implantable Pulse Generator Implant Date: 20211115
Lead Channel Impedance Value: 390 Ohm
Lead Channel Impedance Value: 430 Ohm
Lead Channel Pacing Threshold Amplitude: 0.75 V
Lead Channel Pacing Threshold Amplitude: 1.25 V
Lead Channel Pacing Threshold Pulse Width: 0.5 ms
Lead Channel Pacing Threshold Pulse Width: 1 ms
Lead Channel Sensing Intrinsic Amplitude: 11.7 mV
Lead Channel Sensing Intrinsic Amplitude: 3.6 mV
Lead Channel Setting Pacing Amplitude: 2.5 V
Lead Channel Setting Pacing Amplitude: 2.5 V
Lead Channel Setting Pacing Pulse Width: 0.5 ms
Lead Channel Setting Sensing Sensitivity: 0.5 mV
Pulse Gen Serial Number: 111031056
Zone Setting Status: 755011

## 2022-09-30 ENCOUNTER — Ambulatory Visit (INDEPENDENT_AMBULATORY_CARE_PROVIDER_SITE_OTHER): Payer: Medicare Other | Admitting: Family Medicine

## 2022-09-30 ENCOUNTER — Other Ambulatory Visit (HOSPITAL_COMMUNITY)
Admission: RE | Admit: 2022-09-30 | Discharge: 2022-09-30 | Disposition: A | Discharge status: Home / Self Care | Payer: Medicare Other | Source: Ambulatory Visit | Attending: Family Medicine | Admitting: Family Medicine

## 2022-09-30 VITALS — BP 116/68 | HR 64 | Temp 98.5°F | Ht 76.0 in | Wt 235.0 lb

## 2022-09-30 DIAGNOSIS — N3001 Acute cystitis with hematuria: Secondary | ICD-10-CM

## 2022-09-30 DIAGNOSIS — R300 Dysuria: Secondary | ICD-10-CM | POA: Diagnosis not present

## 2022-09-30 LAB — URINALYSIS, ROUTINE W REFLEX MICROSCOPIC
Bilirubin, UA: NEGATIVE
Glucose, UA: NEGATIVE
Ketones, UA: NEGATIVE
Nitrite, UA: NEGATIVE
Specific Gravity, UA: 1.025 (ref 1.005–1.030)
Urobilinogen, Ur: 1 mg/dL (ref 0.2–1.0)
pH, UA: 6 (ref 5.0–7.5)

## 2022-09-30 LAB — MICROSCOPIC EXAMINATION
RBC, Urine: >30 /hpf — AB (ref 0–2)
Renal Epithel, UA: NONE SEEN /hpf
WBC, UA: >30 /hpf — AB (ref 0–5)

## 2022-09-30 MED ORDER — NITROFURANTOIN MONOHYD MACRO 100 MG PO CAPS: 100.0000 mg | ORAL_CAPSULE | Freq: Two times a day (BID) | ORAL | 0 refills | Status: AC

## 2022-09-30 NOTE — Progress Notes (Signed)
Subjective:  Patient ID: Danny Hinton, male    DOB: [birth year], 42 y.o.   MRN: [id number]  Patient Care Team: Dettinger, Elige Radon, MD as PCP - General (Family Medicine) Croitoru, Rachelle Hora, MD as PCP - Cardiology (Cardiology) Regan Lemming, MD as PCP - Electrophysiology (Cardiology)   Chief Complaint:  Dysuria (Patient was seen on 5/8 and states he is still having dysuria. )   HPI: Danny Hinton is a 42 y.o. male presenting on 09/30/2022 for Dysuria (Patient was seen on 5/8 and states he is still having dysuria. )   Dysuria  This is a recurrent problem. Episode onset: 09/15/2022 - negative urinalysis and culture. The problem occurs every urination. The problem has been waxing and waning. The quality of the pain is described as burning and aching. The pain is mild. There has been no fever. He is Sexually active. There is No history of pyelonephritis. Associated symptoms include frequency and urgency. Pertinent negatives include no chills, discharge, flank pain, hematuria, hesitancy, nausea, possible pregnancy, sweats or vomiting. He has tried increased fluids for the symptoms. The treatment provided no relief.    Relevant past medical, surgical, family, and social history reviewed and updated as indicated.  Allergies and medications reviewed and updated. Data reviewed: Chart in Epic.   Past Medical History:  Diagnosis Date   Anxiety    Atypical atrial flutter (HCC) 08/19/2014   Pace terminated in device clinic.  AFL cycle length was 390 msec.   Automatic implantable cardioverter-defibrillator in situ    a. St Jude in 2011.   B12 nutritional deficiency, B12 = 214 (03/18/19) 04/29/2019   Chronic anticoagulation with Coumadin    Chronic systolic congestive heart failure (HCC)    a. suspected NICM - dx 2010. EF 15% by TEE, 10-20% by echo at that time. b. s/p St. Jude AICD 2011. c. Echo 01/2010: mod dilated LV, EF 30%, mod aortic root dilitation, no significant valvular disease.    Depression    Diabetes mellitus (HCC), Rx Victoza 04/29/2019   Dyslipidemia associated with type 2 diabetes mellitus (HCC), Rx Lipitor 04/29/2019   Eczema    Enlarged aorta (HCC)    Hypertension associated with diabetes (HCC)    Mobitz type 2 second degree atrioventricular block    a. During sleeping hours in 2010 suspected due to OSA.   Morbid obesity (HCC)    Nonischemic cardiomyopathy (HCC)    OSA treated with BiPAP 09/04/2018   Severe obstructive sleep apnea with an AHI of 67.2/h and nocturnal hypoxemia with oxygen saturations as low as 82%. Now on BIPAP at 18/14 cm H2O.   PAF (paroxysmal atrial fibrillation) (HCC)    Palpitations    Paroxysmal atrial flutter (HCC), s/p ablation    Seasonal allergies    Sleep apnea    Thoracic aortic aneurysm (HCC), monitored annually 04/29/2019   Vitamin D deficiency, vitamin D = 6.9 (03/28/19) 04/29/2019    Past Surgical History:  Procedure Laterality Date   ATRIAL FLUTTER ABLATION N/A 01/18/2014   Procedure: ATRIAL FLUTTER ABLATION;  Surgeon: Duke Salvia, MD;  Location: [facility] C[facility] LAB;  Service: Cardiovascular;  Laterality: N/A;   CARDIAC DEFIBRILLATOR PLACEMENT  02/17/10   St. Jude Medical 45DR, model number [id number], serial number [id number]   CARDIOVERSION N/A 12/29/2013   Procedure: CARDIOVERSION;  Surgeon: Duke Salvia, MD;  Location: [facility] OR;  Service: Cardiovascular;  Laterality: N/A;   ICD GENERATOR CHANGEOUT N/A 03/24/2020   Procedure: ICD GENERATOR CHANGEOUT;  Surgeon:  Croitoru, Mihai, MD;  Location: [facility] INVASIVE CV LAB;  Service: Cardiovascular;  Laterality: N/A;   TOOTH EXTRACTION N/A 10/23/2012   Procedure: EXTRACTION TEETH 1, 16, 17, 30, 31;  Surgeon: [name], DDS;  Location: [facility] OR;  Service: Oral Surgery;  Laterality: N/A;    Social History   Socioeconomic History   Marital status: Married    Spouse name: Not on file   Number of children: 1   Years of education: Not on file   Highest education level: 12th grade   Occupational History   Occupation: disabled  Tobacco Use   Smoking status: Never   Smokeless tobacco: Never  Vaping Use   Vaping Use: Never used  Substance and Sexual Activity   Alcohol use: No   Drug use: No   Sexual activity: Yes  Other Topics Concern   Not on file  Social History Narrative   Not on file   Social Determinants of Health   Financial Resource Strain: High Risk (09/30/2022)   Overall Financial Resource Strain (CARDIA)    Difficulty of Paying Living Expenses: Hard  Food Insecurity: Food Insecurity Present (09/30/2022)   Hunger Vital Sign    Worried About Running Out of Food in the Last Year: Often true    Ran Out of Food in the Last Year: Sometimes true  Transportation Needs: No Transportation Needs (09/30/2022)   PRAPARE - Administrator, Civil Service (Medical): No    Lack of Transportation (Non-Medical): No  Physical Activity: Unknown (09/30/2022)   Exercise Vital Sign    Days of Exercise per Week: 0 days    Minutes of Exercise per Session: Not on file  Stress: Stress Concern Present (09/30/2022)   [employer] of Occupational Health - Occupational Stress Questionnaire    Feeling of Stress : Rather much  Social Connections: Moderately Isolated (09/30/2022)   Social Connection and Isolation Panel [NHANES]    Frequency of Communication with Friends and Family: Three times a week    Frequency of Social Gatherings with Friends and Family: Never    Attends Religious Services: Never    Database administrator or Organizations: No    Attends Banker Meetings: Not on file    Marital Status: Married  Intimate Partner Violence: Not on file    Outpatient Encounter Medications as of 09/30/2022  Medication Sig   atorvastatin (LIPITOR) 40 MG tablet Take 1 tablet (40 mg total) by mouth daily.   carvedilol (COREG) 25 MG tablet TAKE 1/2 (ONE-HALF) TABLET BY MOUTH TWICE DAILY WITH A MEAL   FLUoxetine (PROZAC) 20 MG tablet Take 20 mg by mouth  daily.   fluticasone (FLONASE) 50 MCG/ACT nasal spray Place 1 spray into both nostrils as needed.   furosemide (LASIX) 40 MG tablet Take 1 tablet (40 mg total) by mouth as needed. KEEP OV.   loratadine (CLARITIN) 10 MG tablet TAKE 1 TABLET BY MOUTH ONCE DAILY IN THE AFTERNOON   loratadine (EQ ALLERGY RELIEF) 10 MG tablet Take 1 tablet by mouth once daily   metoprolol tartrate (LOPRESSOR) 25 MG tablet Take 1 tablet as needed for palpitations   Multiple Vitamin (MULTIVITAMIN WITH MINERALS) TABS tablet Take 1 tablet by mouth daily.   nitrofurantoin, macrocrystal-monohydrate, (MACROBID) 100 MG capsule Take 1 capsule (100 mg total) by mouth 2 (two) times daily for 7 days. 1 po BId   ondansetron (ZOFRAN) 4 MG tablet Take 1 tablet (4 mg total) by mouth every 8 (eight) hours as needed  for nausea or vomiting.   Probiotic Product (VSL#3) CAPS Take twice a day   sacubitril-valsartan (ENTRESTO) 49-51 MG Take 1 tablet by mouth 2 (two) times daily. KEEP OV.   Vitamin D, Ergocalciferol, (DRISDOL) 1.25 MG (50000 UNIT) CAPS capsule Take 1 capsule (50,000 Units total) by mouth every 7 (seven) days. (Patient taking differently: Take 50,000 Units by mouth 2 (two) times a week. Tues and Thurs)   warfarin (COUMADIN) 5 MG tablet TAKE 1 TO 1 & 1/2 (ONE & ONE-HALF) TABLETS BY MOUTH ONCE DAILY OR AS DIRECTED BY COUMADIN CLINIC   [DISCONTINUED] loperamide (IMODIUM A-D) 2 MG tablet Take as needed   No facility-administered encounter medications on file as of 09/30/2022.    No Known Allergies  Review of Systems  Constitutional:  Negative for activity change, appetite change, chills, diaphoresis, fatigue, fever and unexpected weight change.  HENT: Negative.    Eyes: Negative.   Respiratory:  Negative for cough, chest tightness and shortness of breath.   Cardiovascular:  Negative for chest pain, palpitations and leg swelling.  Gastrointestinal:  Positive for abdominal pain (lower abdominal pain). Negative for blood in  stool, constipation, diarrhea, nausea and vomiting.  Endocrine: Negative.   Genitourinary:  Positive for dysuria, frequency and urgency. Negative for decreased urine volume, difficulty urinating, enuresis, flank pain, genital sores, hematuria, hesitancy, penile discharge, penile pain, penile swelling, scrotal swelling and testicular pain.  Musculoskeletal:  Negative for arthralgias and myalgias.  Skin: Negative.   Allergic/Immunologic: Negative.   Neurological:  Negative for dizziness and headaches.  Hematological: Negative.   Psychiatric/Behavioral:  Negative for confusion, hallucinations, sleep disturbance and suicidal ideas.   All other systems reviewed and are negative.       Objective:  BP 116/68   Pulse 64   Temp 98.5 F (36.9 C) (Temporal)   Ht 6\' 4"  (1.93 m)   Wt 235 lb (106.6 kg)   SpO2 100%   BMI 28.61 kg/m    Wt Readings from Last 3 Encounters:  09/30/22 235 lb (106.6 kg)  09/15/22 235 lb 2 oz (106.7 kg)  12/23/21 244 lb 3.2 oz (110.8 kg)    Physical Exam Vitals and nursing note reviewed.  Constitutional:      General: He is not in acute distress.    Appearance: Normal appearance. He is well-developed and well-groomed. He is not ill-appearing, toxic-appearing or diaphoretic.  HENT:     Head: Normocephalic and atraumatic.     Jaw: There is normal jaw occlusion.     Right Ear: Hearing normal.     Left Ear: Hearing normal.     Nose: Nose normal.     Mouth/Throat:     Lips: Pink.     Mouth: Mucous membranes are moist.     Pharynx: Oropharynx is clear. Uvula midline.  Eyes:     General: Lids are normal.     Extraocular Movements: Extraocular movements intact.     Conjunctiva/sclera: Conjunctivae normal.     Pupils: Pupils are equal, round, and reactive to light.  Neck:     Thyroid: No thyroid mass, thyromegaly or thyroid tenderness.     Vascular: No carotid bruit or JVD.     Trachea: Trachea and phonation normal.  Cardiovascular:     Rate and Rhythm:  Normal rate and regular rhythm.     Chest Wall: PMI is not displaced.     Pulses: Normal pulses.     Heart sounds: Normal heart sounds. No murmur heard.    No  friction rub. No gallop.  Pulmonary:     Effort: Pulmonary effort is normal. No respiratory distress.     Breath sounds: Normal breath sounds. No wheezing.  Abdominal:     General: Bowel sounds are normal. There is no distension or abdominal bruit.     Palpations: Abdomen is soft. There is no hepatomegaly, splenomegaly or mass.     Tenderness: There is no abdominal tenderness. There is no right CVA tenderness, left CVA tenderness, guarding or rebound.     Hernia: No hernia is present.  Musculoskeletal:        General: Normal range of motion.     Cervical back: Normal range of motion and neck supple.     Right lower leg: No edema.     Left lower leg: No edema.  Lymphadenopathy:     Cervical: No cervical adenopathy.  Skin:    General: Skin is warm and dry.     Capillary Refill: Capillary refill takes less than 2 seconds.     Coloration: Skin is not cyanotic, jaundiced or pale.     Findings: No rash.  Neurological:     General: No focal deficit present.     Mental Status: He is alert and oriented to person, place, and time.     Sensory: Sensation is intact.     Motor: Motor function is intact.     Coordination: Coordination is intact.     Gait: Gait is intact.     Deep Tendon Reflexes: Reflexes are normal and symmetric.  Psychiatric:        Attention and Perception: Attention and perception normal.        Mood and Affect: Mood and affect normal.        Speech: Speech normal.        Behavior: Behavior normal. Behavior is cooperative.        Thought Content: Thought content normal.        Cognition and Memory: Cognition and memory normal.        Judgment: Judgment normal.     Results for orders placed or performed in visit on 09/30/22  Microscopic Examination   Urine  Result Value Ref Range   WBC, UA >30 (A) 0 - 5  /hpf   RBC, Urine >30 (A) 0 - 2 /hpf   Epithelial Cells (non renal) 0-10 0 - 10 /hpf   Renal Epithel, UA None seen None seen /hpf   Bacteria, UA Many (A) None seen/Few  Urinalysis, Routine w reflex microscopic  Result Value Ref Range   Specific Gravity, UA 1.025 1.005 - 1.030   pH, UA 6.0 5.0 - 7.5   Color, UA Yellow Yellow   Appearance Ur Clear Clear   Leukocytes,UA 3+ (A) Negative   Protein,UA 2+ (A) Negative/Trace   Glucose, UA Negative Negative   Ketones, UA Negative Negative   RBC, UA 3+ (A) Negative   Bilirubin, UA Negative Negative   Urobilinogen, Ur 1.0 0.2 - 1.0 mg/dL   Nitrite, UA Negative Negative   Microscopic Examination See below:        Pertinent labs & imaging results that were available during my care of the patient were reviewed by me and considered in my medical decision making.  Assessment & Plan:  Danny was seen today for dysuria.  Diagnoses and all orders for this visit:  Dysuria Urinalysis in office revealed 3+0 blood, 2+ protein, 3+ leukocytes, and many bacteria. Culture and STI testing pending.  -  Urinalysis, Routine w reflex microscopic -     Urine cytology ancillary only -     Urine Culture -     Microscopic Examination  Acute cystitis with hematuria Denies concerns for STIs, no red flag concerning for acute pyelonephritis. Will initiate below. Culture pending, will change therapy if warranted.  -     nitrofurantoin, macrocrystal-monohydrate, (MACROBID) 100 MG capsule; Take 1 capsule (100 mg total) by mouth 2 (two) times daily for 7 days. 1 po BId -     Urine Culture     Continue all other maintenance medications.  Follow up plan: Return if symptoms worsen or fail to improve.   Continue healthy lifestyle choices, including diet (rich in fruits, vegetables, and lean proteins, and low in salt and simple carbohydrates) and exercise (at least 30 minutes of moderate physical activity daily).  Educational handout given for UTI  The above  assessment and management plan was discussed with the patient. The patient verbalized understanding of and has agreed to the management plan. Patient is aware to call the clinic if they develop any new symptoms or if symptoms persist or worsen. Patient is aware when to return to the clinic for a follow-up visit. Patient educated on when it is appropriate to go to the emergency department.   Kari Baars, FNP-C [facility] [phone]

## 2022-10-01 LAB — URINE CYTOLOGY ANCILLARY ONLY
Bacterial Vaginitis-Urine: NEGATIVE
Candida Urine: NEGATIVE
Chlamydia: NEGATIVE
Comment: NEGATIVE
Comment: NEGATIVE
Comment: NORMAL
Neisseria Gonorrhea: NEGATIVE
Trichomonas: NEGATIVE

## 2022-10-04 LAB — URINE CULTURE

## 2022-10-05 ENCOUNTER — Telehealth: Payer: Self-pay | Admitting: Family Medicine

## 2022-10-05 ENCOUNTER — Ambulatory Visit: Payer: Medicare Other | Attending: Cardiovascular Disease | Admitting: *Deleted

## 2022-10-05 DIAGNOSIS — Z5181 Encounter for therapeutic drug level monitoring: Secondary | ICD-10-CM | POA: Insufficient documentation

## 2022-10-05 DIAGNOSIS — I480 Paroxysmal atrial fibrillation: Secondary | ICD-10-CM | POA: Insufficient documentation

## 2022-10-05 LAB — POCT INR: INR: 2.6 (ref 2.0–3.0)

## 2022-10-05 MED ORDER — CIPROFLOXACIN HCL 500 MG PO TABS
500.0000 mg | ORAL_TABLET | Freq: Two times a day (BID) | ORAL | 0 refills | Status: AC
Start: 2022-10-05 — End: 2022-10-12

## 2022-10-05 NOTE — Patient Instructions (Signed)
Continue warfarin 1 tablet daily except 1 1/2 tablets on Mondays, Wednesdays and Fridays. Recheck 6 wks 

## 2022-10-05 NOTE — Addendum Note (Signed)
Addended by: Sonny Masters on: 10/05/2022 08:42 AM   Modules accepted: Orders

## 2022-10-05 NOTE — Telephone Encounter (Signed)
Patient aware on previous phone call

## 2022-10-11 ENCOUNTER — Ambulatory Visit: Payer: Medicare Other | Attending: Cardiovascular Disease | Admitting: *Deleted

## 2022-10-11 DIAGNOSIS — Z5181 Encounter for therapeutic drug level monitoring: Secondary | ICD-10-CM | POA: Insufficient documentation

## 2022-10-11 DIAGNOSIS — I480 Paroxysmal atrial fibrillation: Secondary | ICD-10-CM | POA: Diagnosis not present

## 2022-10-11 LAB — POCT INR: INR: 3.5 — AB (ref 2.0–3.0)

## 2022-10-11 NOTE — Patient Instructions (Signed)
Hold warfarin tonight then resume 1 tablet daily except 1 1/2 tablets on Mondays, Wednesdays and Fridays. Recheck 6 wks

## 2022-10-19 NOTE — Progress Notes (Signed)
Remote ICD transmission.   

## 2022-10-31 ENCOUNTER — Other Ambulatory Visit: Payer: Self-pay | Admitting: Cardiovascular Disease

## 2022-10-31 ENCOUNTER — Other Ambulatory Visit: Payer: Self-pay | Admitting: Family Medicine

## 2022-10-31 DIAGNOSIS — E1169 Type 2 diabetes mellitus with other specified complication: Secondary | ICD-10-CM

## 2022-11-01 NOTE — Telephone Encounter (Signed)
Refill request for warfarin:  Last INR was 3.5 on 10/11/22 Next INR due 11/16/22 LOV was 12/14/21  Refill approved.

## 2022-11-02 ENCOUNTER — Other Ambulatory Visit: Payer: Self-pay | Admitting: Family Medicine

## 2022-11-02 DIAGNOSIS — E1169 Type 2 diabetes mellitus with other specified complication: Secondary | ICD-10-CM

## 2022-11-07 ENCOUNTER — Other Ambulatory Visit: Payer: Self-pay | Admitting: Family Medicine

## 2022-11-17 ENCOUNTER — Other Ambulatory Visit: Payer: Self-pay | Admitting: Family Medicine

## 2022-11-17 DIAGNOSIS — E785 Hyperlipidemia, unspecified: Secondary | ICD-10-CM

## 2022-11-25 ENCOUNTER — Ambulatory Visit: Payer: Medicare Other | Attending: Cardiovascular Disease | Admitting: Cardiovascular Disease

## 2022-11-25 ENCOUNTER — Encounter: Payer: Self-pay | Admitting: Cardiovascular Disease

## 2022-11-25 VITALS — BP 90/58 | HR 50 | Ht 77.0 in | Wt 236.4 lb

## 2022-11-25 DIAGNOSIS — I428 Other cardiomyopathies: Secondary | ICD-10-CM | POA: Insufficient documentation

## 2022-11-25 DIAGNOSIS — G4733 Obstructive sleep apnea (adult) (pediatric): Secondary | ICD-10-CM | POA: Diagnosis not present

## 2022-11-25 DIAGNOSIS — I5042 Chronic combined systolic (congestive) and diastolic (congestive) heart failure: Secondary | ICD-10-CM | POA: Insufficient documentation

## 2022-11-25 DIAGNOSIS — I480 Paroxysmal atrial fibrillation: Secondary | ICD-10-CM

## 2022-11-25 DIAGNOSIS — D6869 Other thrombophilia: Secondary | ICD-10-CM | POA: Insufficient documentation

## 2022-11-25 DIAGNOSIS — Z8639 Personal history of other endocrine, nutritional and metabolic disease: Secondary | ICD-10-CM | POA: Insufficient documentation

## 2022-11-25 DIAGNOSIS — I8392 Asymptomatic varicose veins of left lower extremity: Secondary | ICD-10-CM | POA: Insufficient documentation

## 2022-11-25 DIAGNOSIS — E785 Hyperlipidemia, unspecified: Secondary | ICD-10-CM | POA: Insufficient documentation

## 2022-11-25 DIAGNOSIS — E1169 Type 2 diabetes mellitus with other specified complication: Secondary | ICD-10-CM | POA: Diagnosis not present

## 2022-11-25 DIAGNOSIS — I7121 Aneurysm of the ascending aorta, without rupture: Secondary | ICD-10-CM | POA: Diagnosis not present

## 2022-11-25 DIAGNOSIS — E663 Overweight: Secondary | ICD-10-CM | POA: Insufficient documentation

## 2022-11-25 DIAGNOSIS — I10 Essential (primary) hypertension: Secondary | ICD-10-CM | POA: Insufficient documentation

## 2022-11-25 DIAGNOSIS — Z95810 Presence of automatic (implantable) cardiac defibrillator: Secondary | ICD-10-CM

## 2022-11-25 NOTE — Progress Notes (Signed)
Patient ID: Danny Hinton, male   DOB: September 06, 1980, 42 y.o.   MRN: [id number]    Cardiology Office Note    Date:  11/25/2022   ID:  Danny E Morton, DOB 24-Sep-1980, MRN [id number]  PCP:  Dettinger, Elige Radon, MD  Cardiologist:   Thurmon Fair, MD   No chief complaint on file.   History of Present Illness:  Danny E Levit is a 42 y.o. male who presents in follow-up for nonischemic cardiomyopathy, chronic systolic and diastolic heart failure, nonischemic cardiomyopathy, s/p ICD, history of atrial flutter, atrial fibrillation and paroxysmal atrial tachycardia (which has been sustained and led to heart failure decompensation).  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  After successful gastric bypass surgery (Roux-en-Y, March 2022) he has lost about 224 pounds, almost 50% of his initial weight of 460 pounds.    He generally feels well but is poorly tolerant to heat which limits his exertional capacity and makes him dyspneic.  He is on Entresto and carvedilol and even after reducing the doses of these medications his blood pressure remains relatively low, today 90/58.  He no longer has problems with orthostatic dizziness.  On his most recent echocardiogram LV systolic function remains mildly-moderately depressed with an ejection fraction of 40%.  He does not have orthopnea, PND, lower extremity edema, focal neurological complaints, syncope, claudication.  He has prominent varicose veins in the distribution of the left greater saphenous, but is asymptomatic from this.  He does not have any evidence of chronic venous insufficiency related skin changes or ulcerations.  He has had a couple of urinary tract infections this year.  He had a neurological examination that did not show problems with his prostate or kidney stones.  He continues to have palpitations that "briefly take his breath away".  All he has to do is stop and relax for  a few seconds then the problem abates.  He is very sensitive to both atrial and ventricular lead capture threshold testing.  He is exquisitely aware of changes in his heart rate, even when paced at physiological rates.  ICD interrogation shows normal findings.  Lead parameters are excellent.  He has not had any episodes of ventricular tachycardia since February 2022 (pretty much since his gastric bypass surgery).  There is also been a marked reduction in the burden of atrial arrhythmia.  In the last 12 months, his device has only recorded 2 episodes of paroxysmal atrial tachycardia with 1: 1 conduction, both of them quite brief.  He has not had atrial flutter or atrial fibrillation.  He only has roughly 10% atrial pacing and does not require ventricular pacing.   In September 2015 he underwent cavotricuspid isthmus ablation for atrial flutter. On 08/19/2014, while [place] hunting he develop persistent rapid palpitations and felt unwell. He underwent successful overdrive pacing via his device by Dr. Hillis Range. The rhythm was atypical atrial flutter, cycle length roughly 390 ms. The episode lasted for about 3 hours until he was successfully overdrive paced. In the past he has had paroxysmal atrial fibrillation. The decision was made to ablate his flutter secondary to the occurrence of multiple unnecessary defibrillator shocks in the setting of atrial flutter with rapid ventricular rate.   Past Medical History:  Diagnosis Date   Anxiety    Atypical atrial flutter (HCC) 08/19/2014   Pace terminated in device clinic.  AFL cycle length was 390 msec.   Automatic implantable  cardioverter-defibrillator in situ    a. St Jude in 2011.   B12 nutritional deficiency, B12 = 214 (03/18/19) 04/29/2019   Chronic anticoagulation with Coumadin    Chronic systolic congestive heart failure (HCC)    a. suspected NICM - dx 2010. EF 15% by TEE, 10-20% by echo at that time. b. s/p St. Jude AICD 2011. c. Echo 01/2010: mod  dilated LV, EF 30%, mod aortic root dilitation, no significant valvular disease.   Depression    Diabetes mellitus (HCC), Rx Victoza 04/29/2019   Dyslipidemia associated with type 2 diabetes mellitus (HCC), Rx Lipitor 04/29/2019   Eczema    Enlarged aorta (HCC)    Hypertension associated with diabetes (HCC)    Mobitz type 2 second degree atrioventricular block    a. During sleeping hours in 2010 suspected due to OSA.   Morbid obesity (HCC)    Nonischemic cardiomyopathy (HCC)    OSA treated with BiPAP 09/04/2018   Severe obstructive sleep apnea with an AHI of 67.2/h and nocturnal hypoxemia with oxygen saturations as low as 82%. Now on BIPAP at 18/14 cm H2O.   PAF (paroxysmal atrial fibrillation) (HCC)    Palpitations    Paroxysmal atrial flutter (HCC), s/p ablation    Seasonal allergies    Sleep apnea    Thoracic aortic aneurysm (HCC), monitored annually 04/29/2019   Vitamin D deficiency, vitamin D = 6.9 (03/28/19) 04/29/2019    Past Surgical History:  Procedure Laterality Date   ATRIAL FLUTTER ABLATION N/A 01/18/2014   Procedure: ATRIAL FLUTTER ABLATION;  Surgeon: Duke Salvia, MD;  Location: [facility] CATH LAB;  Service: Cardiovascular;  Laterality: N/A;   CARDIAC DEFIBRILLATOR PLACEMENT  02/17/10   St. Jude Medical 45DR, model number [id number], serial number [id number]   CARDIOVERSION N/A 12/29/2013   Procedure: CARDIOVERSION;  Surgeon: Duke Salvia, MD;  Location: [facility];  Service: Cardiovascular;  Laterality: N/A;   ICD GENERATOR CHANGEOUT N/A 03/24/2020   Procedure: ICD GENERATOR CHANGEOUT;  Surgeon: Thurmon Fair, MD;  Location: [facility] INVASIVE CV LAB;  Service: Cardiovascular;  Laterality: N/A;   TOOTH EXTRACTION N/A 10/23/2012   Procedure: EXTRACTION TEETH 1, 16, 17, 30, 31;  Surgeon: [name], DDS;  Location: [facility] OR;  Service: Oral Surgery;  Laterality: N/A;    Outpatient Medications Prior to Visit  Medication Sig Dispense Refill   carvedilol (COREG) 25 MG tablet TAKE 1/2  (ONE-HALF) TABLET BY MOUTH TWICE DAILY WITH A MEAL 90 tablet 0   FLUoxetine (PROZAC) 20 MG tablet Take 20 mg by mouth daily.     furosemide (LASIX) 40 MG tablet Take 1 tablet (40 mg total) by mouth as needed. KEEP OV. 90 tablet 0   loratadine (CLARITIN) 10 MG tablet TAKE 1 TABLET BY MOUTH ONCE DAILY IN  THE  AFTERNOON 90 tablet 1   Multiple Vitamin (MULTIVITAMIN WITH MINERALS) TABS tablet Take 1 tablet by mouth daily.     Probiotic Product (VSL#3) CAPS Take twice a day     sacubitril-valsartan (ENTRESTO) 49-51 MG Take 1 tablet by mouth 2 (two) times daily. KEEP OV. 180 tablet 0   Vitamin D, Ergocalciferol, (DRISDOL) 1.25 MG (50000 UNIT) CAPS capsule Take 1 capsule (50,000 Units total) by mouth every 7 (seven) days. (Patient taking differently: Take 50,000 Units by mouth 2 (two) times a week. Tues and Thurs) 12 capsule 0   warfarin (COUMADIN) 5 MG tablet TAKE 1 TO 1 & 1/2 (ONE & ONE-HALF) TABLETS BY MOUTH ONCE DAILY OR AS DIRECTED BY COUMADIN  CLINIC 135 tablet 1   atorvastatin (LIPITOR) 40 MG tablet Take 1 tablet (40 mg total) by mouth daily. (Patient not taking: Reported on 11/25/2022) 90 tablet 0   fluticasone (FLONASE) 50 MCG/ACT nasal spray Place 1 spray into both nostrils as needed. (Patient not taking: Reported on 11/25/2022) 16 g 11   loratadine (EQ ALLERGY RELIEF) 10 MG tablet Take 1 tablet by mouth once daily 30 tablet 0   metoprolol tartrate (LOPRESSOR) 25 MG tablet Take 1 tablet as needed for palpitations (Patient not taking: Reported on 11/25/2022) 90 tablet 0   ondansetron (ZOFRAN) 4 MG tablet Take 1 tablet (4 mg total) by mouth every 8 (eight) hours as needed for nausea or vomiting. (Patient not taking: Reported on 11/25/2022) 20 tablet 0   No facility-administered medications prior to visit.     Allergies:   Patient has no known allergies.   Social History   Socioeconomic History   Marital status: Married    Spouse name: Not on file   Number of children: 1   Years of education:  Not on file   Highest education level: 12th grade  Occupational History   Occupation: disabled  Tobacco Use   Smoking status: Never   Smokeless tobacco: Never  Vaping Use   Vaping status: Never Used  Substance and Sexual Activity   Alcohol use: No   Drug use: No   Sexual activity: Yes  Other Topics Concern   Not on file  Social History Narrative   Not on file   Social Determinants of Health   Financial Resource Strain: High Risk (09/30/2022)   Overall Financial Resource Strain (CARDIA)    Difficulty of Paying Living Expenses: Hard  Food Insecurity: Food Insecurity Present (09/30/2022)   Hunger Vital Sign    Worried About Running Out of Food in the Last Year: Often true    Ran Out of Food in the Last Year: Sometimes true  Transportation Needs: No Transportation Needs (09/30/2022)   PRAPARE - Administrator, Civil Service (Medical): No    Lack of Transportation (Non-Medical): No  Physical Activity: Unknown (09/30/2022)   Exercise Vital Sign    Days of Exercise per Week: 0 days    Minutes of Exercise per Session: Not on file  Stress: Stress Concern Present (09/30/2022)   [employer] of Occupational Health - Occupational Stress Questionnaire    Feeling of Stress : Rather much  Social Connections: Moderately Isolated (09/30/2022)   Social Connection and Isolation Panel [NHANES]    Frequency of Communication with Friends and Family: Three times a week    Frequency of Social Gatherings with Friends and Family: Never    Attends Religious Services: Never    Database administrator or Organizations: No    Attends Engineer, structural: Not on file    Marital Status: Married     Family History:  The patient's family history includes Anxiety disorder in his mother; Arrhythmia in his mother; Colon polyps in his father; Depression in his mother; Diabetes in his paternal grandmother; Heart disease in his maternal grandfather and mother; Hodgkin's lymphoma (age of  onset: 10) in his brother; Irritable bowel syndrome in his maternal aunt.   ROS:   Please see the history of present illness.    ROS All other systems are reviewed and are negative.   PHYSICAL EXAM:   VS:  BP (!) 90/58 (BP Location: Left Arm, Patient Position: Sitting, Cuff Size: Large)   Pulse (!) 50  Ht 6\' 5"  (1.956 m)   Wt 236 lb 6.4 oz (107.2 kg)   SpO2 98%   BMI 28.03 kg/m      General: Alert, oriented x3, no distress, healthy left subclavian ICD site Head: no evidence of trauma, PERRL, EOMI, no exophtalmos or lid lag, no myxedema, no xanthelasma; normal ears, nose and oropharynx Neck: normal jugular venous pulsations and no hepatojugular reflux; brisk carotid pulses without delay and no carotid bruits Chest: clear to auscultation, no signs of consolidation by percussion or palpation, normal fremitus, symmetrical and full respiratory excursions Cardiovascular: normal position and quality of the apical impulse, regular rhythm, normal first and second heart sounds, no murmurs, rubs or gallops Abdomen: no tenderness or distention, no masses by palpation, no abnormal pulsatility or arterial bruits, normal bowel sounds, no hepatosplenomegaly Extremities: no clubbing, cyanosis or edema; normal distal pulses.  Very prominent serpiginous greater saphenous vein varicosities.  No evidence of chronic skin changes or ulcerations. Neurological: grossly nonfocal Psych: Normal mood and affect     Wt Readings from Last 3 Encounters:  11/25/22 236 lb 6.4 oz (107.2 kg)  09/30/22 235 lb (106.6 kg)  09/15/22 235 lb 2 oz (106.7 kg)      Studies/Labs Reviewed:   ECHO 12/28/2021:  1. Left ventricular ejection fraction, by estimation, is 40 to 45%. The  left ventricle has mildly decreased function. The left ventricle  demonstrates global hypokinesis. The left ventricular internal cavity size  was mildly to moderately dilated. Left  ventricular diastolic parameters are indeterminate.   2.  Right ventricular systolic function is normal. The right ventricular  size is mildly enlarged. There is normal pulmonary artery systolic  pressure.   3. Right atrial size was mildly dilated.   4. The mitral valve is grossly normal. Trivial mitral valve  regurgitation. No evidence of mitral stenosis.   5. The aortic valve is tricuspid. Aortic valve regurgitation is not  visualized. No aortic stenosis is present.   6. Aortic dilatation noted. Aneurysm of the aortic root, measuring 46 mm.  There is mild dilatation of the ascending aorta.   7. The inferior vena cava is normal in size with greater than 50%  respiratory variability, suggesting right atrial pressure of 3 mmHg.   Comparison(s): No significant change from prior study. 01/27/18 EF 40%.   EKG:  EKG is ordered today and shows atrial paced, ventricular sensed rhythm at 50 bpm.  There are no ischemic repolarization changes and QT interval is normal. Recent Labs: 12/11/2021: Hemoglobin 13.1; Platelets 230   Lipid Panel    Component Value Date/Time   CHOL 122 06/04/2021 0921   TRIG 38 06/04/2021 0921   HDL 37 (L) 06/04/2021 0921   CHOLHDL 3.3 06/04/2021 0921   CHOLHDL 5.3 03/20/2009 0410   VLDL 22 03/20/2009 0410   LDLCALC 75 06/04/2021 0921   LDLDIRECT 52 03/26/2015 0824     ASSESSMENT:    1. Chronic combined systolic and diastolic heart failure (HCC)   2. Non-ischemic cardiomyopathy (HCC)   3. Paroxysmal atrial fibrillation (HCC)   4. Essential hypertension   5. Acquired thrombophilia (HCC)   6. ICD (implantable cardioverter-defibrillator) in place   7. Overweight (BMI 25.0-29.9)   8. Dyslipidemia (high LDL; low HDL)   9. History of diabetes mellitus   10. OSA treated with BiPAP   11. Aneurysm of ascending aorta without rupture (HCC)   12. Asymptomatic varicose veins of left lower extremity      PLAN:  In order of problems  listed above:    CHF: Usual NYHA functional class II, occasional functional class III.   Clinically euvolemic.  He needs to take furosemide intermittently on the average once every 1 or 2 weeks.  Not a good candidate for show due to inhibitors due to history of recurrent unexplained urinary tract infections unusual for a young man.  On maximum tolerated doses of carvedilol and Entresto (we had to slightly reduce the doses after he lost half of his body weight following gastric bypass surgery. CMP: Nonischemic.  Although ejection fraction has improved from its worst level, he still has moderately depressed LVEF 40%.  There is no room to add spironolactone.  SGLT2 inhibitors relatively contraindicated. Paroxysmal atrial tachycardia/atypical flutter/AFib: with history of previous cavotricuspid isthmus flutter ablation and subsequent episodes of symptomatic paroxysmal atrial tachycardia with 1: 1 AV conduction.  The burden of arrhythmia are medically less since his weight loss.  He is no longer on antiarrhythmics other than carvedilol.  Only 2 brief episodes of atrial tachycardia in the last 12 months. Warfarin: Well-tolerated.  No bleeding complications. ICD:  device function is normal.  Performing remote downloads every 3 months. Overweight: He has had a remarkably rapid weight loss after gastric bypass, now he is only moderately overweight. HLP: Despite substantial weight loss, there was only mild improvement in his HDL cholesterol which is disappointing. DM: "Cured" with weight loss OSA: Compliant with BiPAP, denies hypersomnolence. TAAA: remarkably, his aortic dimensions are getting smaller.  He was seeing Dr. Laneta Simmers but in 2022 a couple of their appointments had to be rescheduled and it looks like he has not had any follow-up since. Per last CTA 10/08/2021: Thoracic Aorta: --Ascending Aorta: 3.7 cm, previously 4.0 cm --Aortic Arch: 3.3 cm --Descending Aorta: 3.1 cm, previously 3.3 cm No aortic root measurement reported, but by my measurement 45 mm. When adjusted for his body size (6'5"  tall) these aortic diameters are actually not at all impressive.  Will increase the frequency of scans to every 3 years, due in 2026.  Perform sooner if symptomatic.  Medication Adjustments/Labs and Tests Ordered: Current medicines are reviewed at length with the patient today.  Concerns regarding medicines are outlined above.  Medication changes, Labs and Tests ordered today are listed in the Patient Instructions below. Patient Instructions  Medication Instructions:  No changes *If you need a refill on your cardiac medications before your next appointment, please call your pharmacy*   Lab Work: Lipid panel and CMET- today If you have labs (blood work) drawn today and your tests are completely normal, you will receive your results only by: MyChart Message (if you have MyChart) OR A paper copy in the mail If you have any lab test that is abnormal or we need to change your treatment, we will call you to review the results.  Follow-Up: At [facility], you and your health needs are our priority.  As part of our continuing mission to provide you with exceptional heart care, we have created designated Provider Care Teams.  These Care Teams include your primary Cardiologist (physician) and Advanced Practice Providers (APPs -  Physician Assistants and Nurse Practitioners) who all work together to provide you with the care you need, when you need it.  We recommend signing up for the patient portal called "MyChart".  Sign up information is provided on this After Visit Summary.  MyChart is used to connect with patients for Virtual Visits (Telemedicine).  Patients are able to view lab/test results, encounter notes, upcoming  appointments, etc.  Non-urgent messages can be sent to your provider as well.   To learn more about what you can do with MyChart, go to [email or web address].    Your next appointment:   1 year(s)  Provider:   Thurmon Fair, MD         Signed, Thurmon Fair, MD   11/25/2022 4:30 PM    [facility] [address] [place], [place], [place]  [address] Phone: [phone]; Fax: [phone]

## 2022-11-25 NOTE — Patient Instructions (Signed)
Medication Instructions:  No changes *If you need a refill on your cardiac medications before your next appointment, please call your pharmacy*   Lab Work: Lipid panel and CMET- today If you have labs (blood work) drawn today and your tests are completely normal, you will receive your results only by: MyChart Message (if you have MyChart) OR A paper copy in the mail If you have any lab test that is abnormal or we need to change your treatment, we will call you to review the results.  Follow-Up: At [facility], you and your health needs are our priority.  As part of our continuing mission to provide you with exceptional heart care, we have created designated Provider Care Teams.  These Care Teams include your primary Cardiologist (physician) and Advanced Practice Providers (APPs -  Physician Assistants and Nurse Practitioners) who all work together to provide you with the care you need, when you need it.  We recommend signing up for the patient portal called "MyChart".  Sign up information is provided on this After Visit Summary.  MyChart is used to connect with patients for Virtual Visits (Telemedicine).  Patients are able to view lab/test results, encounter notes, upcoming appointments, etc.  Non-urgent messages can be sent to your provider as well.   To learn more about what you can do with MyChart, go to [email or web address].    Your next appointment:   1 year(s)  Provider:   Thurmon Fair, MD

## 2022-11-26 LAB — COMPREHENSIVE METABOLIC PANEL
ALT: 23 IU/L (ref 0–44)
AST: 16 IU/L (ref 0–40)
Albumin: 4.2 g/dL (ref 4.1–5.1)
Alkaline Phosphatase: 85 IU/L (ref 44–121)
BUN/Creatinine Ratio: 14 (ref 9–20)
BUN: 12 mg/dL (ref 6–24)
Bilirubin Total: 1.3 mg/dL — ABNORMAL HIGH (ref 0.0–1.2)
CO2: 26 mmol/L (ref 20–29)
Calcium: 9.2 mg/dL (ref 8.7–10.2)
Chloride: 101 mmol/L (ref 96–106)
Creatinine, Ser: 0.87 mg/dL (ref 0.76–1.27)
Globulin, Total: 2.8 g/dL (ref 1.5–4.5)
Glucose: 89 mg/dL (ref 70–99)
Potassium: 4.3 mmol/L (ref 3.5–5.2)
Sodium: 140 mmol/L (ref 134–144)
Total Protein: 7 g/dL (ref 6.0–8.5)
eGFR: 111 mL/min/{1.73_m2} (ref >59)

## 2022-11-26 LAB — LIPID PANEL
Chol/HDL Ratio: 3.3 ratio (ref 0.0–5.0)
Cholesterol, Total: 155 mg/dL (ref 100–199)
HDL: 47 mg/dL (ref >39)
LDL Chol Calc (NIH): 97 mg/dL (ref 0–99)
Triglycerides: 52 mg/dL (ref 0–149)
VLDL Cholesterol Cal: 11 mg/dL (ref 5–40)

## 2022-12-07 ENCOUNTER — Other Ambulatory Visit: Payer: Self-pay | Admitting: Cardiovascular Disease

## 2022-12-15 ENCOUNTER — Ambulatory Visit: Payer: Medicare Other

## 2022-12-15 VITALS — Ht 77.0 in | Wt 236.0 lb

## 2022-12-15 DIAGNOSIS — Z0000 Encounter for general adult medical examination without abnormal findings: Secondary | ICD-10-CM | POA: Diagnosis not present

## 2022-12-15 NOTE — Patient Instructions (Signed)
Danny Hinton , Thank you for taking time to come for your Medicare Wellness Visit. I appreciate your ongoing commitment to your health goals. Please review the following plan we discussed and let me know if I can assist you in the future.   Referrals/Orders/Follow-Ups/Clinician Recommendations: Aim for 30 minutes of exercise or brisk walking, 6-8 glasses of water, and 5 servings of fruits and vegetables each day.   This is a list of the screening recommended for you and due dates:  Health Maintenance  Topic Date Due   HIV Screening  Never done   Yearly kidney health urinalysis for diabetes  Never done   Hepatitis C Screening  Never done   Complete foot exam   08/13/2021   Hemoglobin A1C  12/02/2021   COVID-19 Vaccine (1 - 2023-24 season) Never done   DTaP/Tdap/Td vaccine (2 - Tdap) 01/17/2022   Flu Shot  12/09/2022   Yearly kidney function blood test for diabetes  11/25/2023   Medicare Annual Wellness Visit  12/15/2023   HPV Vaccine  Aged Out   Eye exam for diabetics  Discontinued    Advanced directives: (Provided) Advance directive discussed with you today. I have provided a copy for you to complete at home and have notarized. Once this is complete, please bring a copy in to our office so we can scan it into your chart. Information on Advanced Care Planning can be found at [facility] Advance Health Care Directives Advance Health Care Directives ([email or web address]    Next Medicare Annual Wellness Visit scheduled for next year: Yes  Preventive Care 40-64 Years, Male Preventive care refers to lifestyle choices and visits with your health care provider that can promote health and wellness. What does preventive care include? A yearly physical exam. This is also called an annual well check. Dental exams once or twice a year. Routine eye exams. Ask your health care provider how often you should have your eyes checked. Personal lifestyle choices, including: Daily care of your  teeth and gums. Regular physical activity. Eating a healthy diet. Avoiding tobacco and drug use. Limiting alcohol use. Practicing safe sex. Taking low-dose aspirin every day starting at age 95. What happens during an annual well check? The services and screenings done by your health care provider during your annual well check will depend on your age, overall health, lifestyle risk factors, and family history of disease. Counseling  Your health care provider may ask you questions about your: Alcohol use. Tobacco use. Drug use. Emotional well-being. Home and relationship well-being. Sexual activity. Eating habits. Work and work Astron[employer]. Screening  You may have the following tests or measurements: Height, weight, and BMI. Blood pressure. Lipid and cholesterol levels. These may be checked every 5 years, or more frequently if you are over 19 years old. Skin check. Lung cancer screening. You may have this screening every year starting at age 71 if you have a 30-pack-year history of smoking and currently smoke or have quit within the past 15 years. Fecal occult blood test (FOBT) of the stool. You may have this test every year starting at age 53. Flexible sigmoidoscopy or colonoscopy. You may have a sigmoidoscopy every 5 years or a colonoscopy every 10 years starting at age 1. Prostate cancer screening. Recommendations will vary depending on your family history and other risks. Hepatitis C blood test. Hepatitis B blood test. Sexually transmitted disease (STD) testing. Diabetes screening. This is done by checking your blood sugar (glucose) after you have not eaten  for a while (fasting). You may have this done every 1-3 years. Discuss your test results, treatment options, and if necessary, the need for more tests with your health care provider. Vaccines  Your health care provider may recommend certain vaccines, such as: Influenza vaccine. This is recommended every year. Tetanus,  diphtheria, and acellular pertussis (Tdap, Td) vaccine. You may need a Td booster every 10 years. Zoster vaccine. You may need this after age 69. Pneumococcal 13-valent conjugate (PCV13) vaccine. You may need this if you have certain conditions and have not been vaccinated. Pneumococcal polysaccharide (PPSV23) vaccine. You may need one or two doses if you smoke cigarettes or if you have certain conditions. Talk to your health care provider about which screenings and vaccines you need and how often you need them. This information is not intended to replace advice given to you by your health care provider. Make sure you discuss any questions you have with your health care provider. Document Released: 05/23/2015 Document Revised: 01/14/2016 Document Reviewed: 02/25/2015 Elsevier Interactive Patient Education  2017 [employer].  Fall Prevention in the Home Falls can cause injuries. They can happen to people of all ages. There are many things you can do to make your home safe and to help prevent falls. What can I do on the outside of my home? Regularly fix the edges of walkways and driveways and fix any cracks. Remove anything that might make you trip as you walk through a door, such as a raised step or threshold. Trim any bushes or trees on the path to your home. Use bright outdoor lighting. Clear any walking paths of anything that might make someone trip, such as rocks or tools. Regularly check to see if handrails are loose or broken. Make sure that both sides of any steps have handrails. Any raised decks and porches should have guardrails on the edges. Have any leaves, snow, or ice cleared regularly. Use sand or salt on walking paths during winter. Clean up any spills in your garage right away. This includes oil or grease spills. What can I do in the bathroom? Use night lights. Install grab bars by the toilet and in the tub and shower. Do not use towel bars as grab bars. Use non-skid mats or  decals in the tub or shower. If you need to sit down in the shower, use a plastic, non-slip stool. Keep the floor dry. Clean up any water that spills on the floor as soon as it happens. Remove soap buildup in the tub or shower regularly. Attach bath mats securely with double-sided non-slip rug tape. Do not have throw rugs and other things on the floor that can make you trip. What can I do in the bedroom? Use night lights. Make sure that you have a light by your bed that is easy to reach. Do not use any sheets or blankets that are too big for your bed. They should not hang down onto the floor. Have a firm chair that has side arms. You can use this for support while you get dressed. Do not have throw rugs and other things on the floor that can make you trip. What can I do in the kitchen? Clean up any spills right away. Avoid walking on wet floors. Keep items that you use a lot in easy-to-reach places. If you need to reach something above you, use a strong step stool that has a grab bar. Keep electrical cords out of the way. Do not use floor polish or  wax that makes floors slippery. If you must use wax, use non-skid floor wax. Do not have throw rugs and other things on the floor that can make you trip. What can I do with my stairs? Do not leave any items on the stairs. Make sure that there are handrails on both sides of the stairs and use them. Fix handrails that are broken or loose. Make sure that handrails are as long as the stairways. Check any carpeting to make sure that it is firmly attached to the stairs. Fix any carpet that is loose or worn. Avoid having throw rugs at the top or bottom of the stairs. If you do have throw rugs, attach them to the floor with carpet tape. Make sure that you have a light switch at the top of the stairs and the bottom of the stairs. If you do not have them, ask someone to add them for you. What else can I do to help prevent falls? Wear shoes that: Do not  have high heels. Have rubber bottoms. Are comfortable and fit you well. Are closed at the toe. Do not wear sandals. If you use a stepladder: Make sure that it is fully opened. Do not climb a closed stepladder. Make sure that both sides of the stepladder are locked into place. Ask someone to hold it for you, if possible. Clearly mark and make sure that you can see: Any grab bars or handrails. First and last steps. Where the edge of each step is. Use tools that help you move around (mobility aids) if they are needed. These include: Canes. Walkers. Scooters. Crutches. Turn on the lights when you go into a dark area. Replace any light bulbs as soon as they burn out. Set up your furniture so you have a clear path. Avoid moving your furniture around. If any of your floors are uneven, fix them. If there are any pets around you, be aware of where they are. Review your medicines with your doctor. Some medicines can make you feel dizzy. This can increase your chance of falling. Ask your doctor what other things that you can do to help prevent falls. This information is not intended to replace advice given to you by your health care provider. Make sure you discuss any questions you have with your health care provider. Document Released: 02/20/2009 Document Revised: 10/02/2015 Document Reviewed: 05/31/2014 Elsevier Interactive Patient Education  2017 [employer].

## 2022-12-15 NOTE — Progress Notes (Signed)
Subjective:   Danny Hinton is a 42 y.o. male who presents for Medicare Annual/Subsequent preventive examination.  Visit Complete: Virtual  I connected with  Danny Hinton on 12/15/22 by a audio enabled telemedicine application and verified that I am speaking with the correct person using two identifiers.  Patient Location: Home  Provider Location: Home Office  I discussed the limitations of evaluation and management by telemedicine. The patient expressed understanding and agreed to proceed.  Patient Medicare AWV questionnaire was completed by the patient on 12/15/2022; I have confirmed that all information answered by patient is correct and no changes since this date.  Review of Systems    Vital Signs: Unable to obtain new vitals due to this being a telehealth visit.  Cardiac Risk Factors include: advanced age (>84men, >37 women);dyslipidemia;male gender;hypertension;sedentary lifestyle     Objective:    Today's Vitals   12/15/22 0802  Weight: 236 lb (107 kg)  Height: 6\' 5"  (1.956 m)   Body mass index is 27.99 kg/m.     12/15/2022    8:06 AM 03/24/2020    2:11 PM 03/29/2018    9:01 PM 09/14/2017    8:48 AM 02/10/2015    9:31 AM 01/18/2014   12:26 PM 12/27/2013    6:08 AM  Advanced Directives  Does Patient Have a Medical Advance Directive? No No No No Yes No No  Type of Agricultural consultant;Living will    Does patient want to make changes to medical advance directive?     No - Patient declined    Copy of Healthcare Power of Attorney in Chart?     No - copy requested    Would patient like information on creating a medical advance directive? Yes (MAU/Ambulatory/Procedural Areas - Information given) No - Patient declined No - Patient declined Yes (MAU/Ambulatory/Procedural Areas - Information given)  No - patient declined information No - patient declined information    Current Medications (verified) Outpatient Encounter Medications as of 12/15/2022   Medication Sig   atorvastatin (LIPITOR) 40 MG tablet Take 1 tablet (40 mg total) by mouth daily.   carvedilol (COREG) 25 MG tablet TAKE 1/2 (ONE-HALF) TABLET BY MOUTH TWICE DAILY WITH A MEAL   FLUoxetine (PROZAC) 20 MG tablet Take 20 mg by mouth daily.   fluticasone (FLONASE) 50 MCG/ACT nasal spray Place 1 spray into both nostrils as needed.   furosemide (LASIX) 40 MG tablet TAKE 1 TABLET BY MOUTH ONCE DAILY AS NEEDED. KEEP OFFICE VISIT   loratadine (CLARITIN) 10 MG tablet TAKE 1 TABLET BY MOUTH ONCE DAILY IN  THE  AFTERNOON   metoprolol tartrate (LOPRESSOR) 25 MG tablet Take 1 tablet as needed for palpitations   Multiple Vitamin (MULTIVITAMIN WITH MINERALS) TABS tablet Take 1 tablet by mouth daily.   ondansetron (ZOFRAN) 4 MG tablet Take 1 tablet (4 mg total) by mouth every 8 (eight) hours as needed for nausea or vomiting.   Probiotic Product (VSL#3) CAPS Take twice a day   sacubitril-valsartan (ENTRESTO) 49-51 MG Take 1 tablet by mouth 2 (two) times daily. KEEP OV.   Vitamin D, Ergocalciferol, (DRISDOL) 1.25 MG (50000 UNIT) CAPS capsule Take 1 capsule (50,000 Units total) by mouth every 7 (seven) days. (Patient taking differently: Take 50,000 Units by mouth 2 (two) times a week. Tues and Thurs)   warfarin (COUMADIN) 5 MG tablet TAKE 1 TO 1 & 1/2 (ONE & ONE-HALF) TABLETS BY MOUTH ONCE DAILY OR AS DIRECTED  BY COUMADIN CLINIC   No facility-administered encounter medications on file as of 12/15/2022.    Allergies (verified) Patient has no known allergies.   History: Past Medical History:  Diagnosis Date   Anxiety    Atypical atrial flutter (HCC) 08/19/2014   Pace terminated in device clinic.  AFL cycle length was 390 msec.   Automatic implantable cardioverter-defibrillator in situ    a. St Jude in 2011.   B12 nutritional deficiency, B12 = 214 (03/18/19) 04/29/2019   Chronic anticoagulation with Coumadin    Chronic systolic congestive heart failure (HCC)    a. suspected NICM - dx 2010. EF  15% by TEE, 10-20% by echo at that time. b. s/p St. Jude AICD 2011. c. Echo 01/2010: mod dilated LV, EF 30%, mod aortic root dilitation, no significant valvular disease.   Depression    Diabetes mellitus (HCC), Rx Victoza 04/29/2019   Dyslipidemia associated with type 2 diabetes mellitus (HCC), Rx Lipitor 04/29/2019   Eczema    Enlarged aorta (HCC)    Hypertension associated with diabetes (HCC)    Mobitz type 2 second degree atrioventricular block    a. During sleeping hours in 2010 suspected due to OSA.   Morbid obesity (HCC)    Nonischemic cardiomyopathy (HCC)    OSA treated with BiPAP 09/04/2018   Severe obstructive sleep apnea with an AHI of 67.2/h and nocturnal hypoxemia with oxygen saturations as low as 82%. Now on BIPAP at 18/14 cm H2O.   PAF (paroxysmal atrial fibrillation) (HCC)    Palpitations    Paroxysmal atrial flutter (HCC), s/p ablation    Seasonal allergies    Sleep apnea    Thoracic aortic aneurysm (HCC), monitored annually 04/29/2019   Vitamin D deficiency, vitamin D = 6.9 (03/28/19) 04/29/2019   Past Surgical History:  Procedure Laterality Date   ATRIAL FLUTTER ABLATION N/A 01/18/2014   Procedure: ATRIAL FLUTTER ABLATION;  Surgeon: Duke Salvia, MD;  Location: [facility] CATH LAB;  Service: Cardiovascular;  Laterality: N/A;   CARDIAC DEFIBRILLATOR PLACEMENT  02/17/10   St. Jude Medical 45DR, model number [id number], serial number [id number]   CARDIOVERSION N/A 12/29/2013   Procedure: CARDIOVERSION;  Surgeon: Duke Salvia, MD;  Location: [facility] OR;  Service: Cardiovascular;  Laterality: N/A;   ICD GENERATOR CHANGEOUT N/A 03/24/2020   Procedure: ICD GENERATOR CHANGEOUT;  Surgeon: Thurmon Fair, MD;  Location: [facility] INVASIVE CV LAB;  Service: Cardiovascular;  Laterality: N/A;   TOOTH EXTRACTION N/A 10/23/2012   Procedure: EXTRACTION TEETH 1, 16, 17, 30, 31;  Surgeon: [name], DDS;  Location: [facility] OR;  Service: Oral Surgery;  Laterality: N/A;   Family History  Problem Relation  Age of Onset   Hodgkin's lymphoma Brother 37   Heart disease Mother    Depression Mother    Anxiety disorder Mother    Arrhythmia Mother    Colon polyps Father    Heart disease Maternal Grandfather    Diabetes Paternal Grandmother    Irritable bowel syndrome Maternal Aunt        2 or 3 mat aunts have IBS   Social History   Socioeconomic History   Marital status: Married    Spouse name: Not on file   Number of children: 1   Years of education: Not on file   Highest education level: 12th grade  Occupational History   Occupation: disabled  Tobacco Use   Smoking status: Never   Smokeless tobacco: Never  Vaping Use   Vaping status: Never Used  Substance and Sexual Activity   Alcohol use: No   Drug use: No   Sexual activity: Yes  Other Topics Concern   Not on file  Social History Narrative   Not on file   Social Determinants of Health   Financial Resource Strain: Low Risk  (12/15/2022)   Overall Financial Resource Strain (CARDIA)    Difficulty of Paying Living Expenses: Not hard at all  Recent Concern: Financial Resource Strain - High Risk (09/30/2022)   Overall Financial Resource Strain (CARDIA)    Difficulty of Paying Living Expenses: Hard  Food Insecurity: No Food Insecurity (12/15/2022)   Hunger Vital Sign    Worried About Running Out of Food in the Last Year: Never true    Ran Out of Food in the Last Year: Never true  Recent Concern: Food Insecurity - Food Insecurity Present (09/30/2022)   Hunger Vital Sign    Worried About Running Out of Food in the Last Year: Often true    Ran Out of Food in the Last Year: Sometimes true  Transportation Needs: No Transportation Needs (12/15/2022)   PRAPARE - Administrator, Civil Service (Medical): No    Lack of Transportation (Non-Medical): No  Physical Activity: Inactive (12/15/2022)   Exercise Vital Sign    Days of Exercise per Week: 0 days    Minutes of Exercise per Session: 0 min  Stress: No Stress Concern Present  (12/15/2022)   [employer] of Occupational Health - Occupational Stress Questionnaire    Feeling of Stress : Not at all  Recent Concern: Stress - Stress Concern Present (09/30/2022)   [employer] of Occupational Health - Occupational Stress Questionnaire    Feeling of Stress : Rather much  Social Connections: Moderately Isolated (12/15/2022)   Social Connection and Isolation Panel [NHANES]    Frequency of Communication with Friends and Family: More than three times a week    Frequency of Social Gatherings with Friends and Family: More than three times a week    Attends Religious Services: Never    Database administrator or Organizations: No    Attends Engineer, structural: Never    Marital Status: Married    Tobacco Counseling Counseling given: Not Answered   Clinical Intake:  Pre-visit preparation completed: Yes  Pain : No/denies pain     Nutritional Risks: None Diabetes: No  How often do you need to have someone help you when you read instructions, pamphlets, or other written materials from your doctor or pharmacy?: 1 - Never  Interpreter Needed?: No  Information entered by :: Renie Ora, LPN   Activities of Daily Living    12/15/2022    8:06 AM  In your present state of health, do you have any difficulty performing the following activities:  Hearing? 0  Vision? 0  Difficulty concentrating or making decisions? 0  Walking or climbing stairs? 0  Dressing or bathing? 0  Doing errands, shopping? 0  Preparing Food and eating ? N  Using the Toilet? N  In the past six months, have you accidently leaked urine? N  Do you have problems with loss of bowel control? N  Managing your Medications? N  Managing your Finances? N  Housekeeping or managing your Housekeeping? N    Patient Care Team: Dettinger, Elige Radon, MD as PCP - General (Family Medicine) Croitoru, Rachelle Hora, MD as PCP - Cardiology (Cardiology) Regan Lemming, MD as PCP -  Electrophysiology (Cardiology)  Indicate any recent Medical Services you  may have received from other than [facility] providers in the past year (date may be approximate).     Assessment:   This is a routine wellness examination for Danny.  Hearing/Vision screen Vision Screening - Comments:: Wears rx glasses - up to date with routine eye exams with  Dr.Le   Dietary issues and exercise activities discussed:     Goals Addressed             This Visit's Progress    Exercise 3x per week (30 min per time)         Depression Screen    12/15/2022    8:04 AM 12/23/2021    4:01 PM 12/11/2021    1:39 PM 06/04/2021    8:55 AM 08/13/2020    8:27 AM 05/15/2020    9:11 AM 03/28/2019    8:02 AM  PHQ 2/9 Scores  PHQ - 2 Score 0 6 5 4 6 6 4   PHQ- 9 Score  22 22 16  14 15     Fall Risk    12/15/2022    8:03 AM 12/23/2021    4:01 PM 12/11/2021    1:37 PM 06/04/2021    8:55 AM 08/13/2020    8:27 AM  Fall Risk   Falls in the past year? 0 1 1 1  0  Number falls in past yr: 0 1 1 1    Injury with Fall? 0 0 0 0   Risk for fall due to : No Fall Risks  Medication side effect Impaired balance/gait   Follow up Falls prevention discussed Falls prevention discussed Falls evaluation completed Falls evaluation completed     MEDICARE RISK AT HOME:  Medicare Risk at Home - 12/15/22 0803     Any stairs in or around the home? No    If so, are there any without handrails? No    Home free of loose throw rugs in walkways, pet beds, electrical cords, etc? Yes    Adequate lighting in your home to reduce risk of falls? Yes    Life alert? No    Use of a cane, walker or w/c? No    Grab bars in the bathroom? Yes    Shower chair or bench in shower? Yes    Elevated toilet seat or a handicapped toilet? Yes             TIMED UP AND GO:  Was the test performed?  No    Cognitive Function:    09/14/2017    8:45 AM 02/10/2015    9:37 AM  MMSE - Mini Mental State Exam  Orientation to time 5 5  Orientation to Place 5  5  Registration 3 3  Attention/ Calculation 5 5  Recall 3 2  Language- name 2 objects 2 2  Language- repeat 1 1  Language- follow 3 step command 3 3  Language- read & follow direction 1 1  Write a sentence 1 1  Copy design 1 1  Total score 30 29        12/15/2022    8:06 AM  6CIT Screen  What Year? 0 points  What month? 0 points  What time? 0 points  Count back from 20 0 points  Months in reverse 0 points  Repeat phrase 0 points  Total Score 0 points    Immunizations Immunization History  Administered Date(s) Administered   Influenza,inj,Quad PF,6+ Mos 02/07/2013, 03/26/2015, 03/11/2016   Influenza-Unspecified 02/06/2014, 12/26/2017   Pneumococcal Polysaccharide-23 12/28/2013  Td 01/18/2012    TDAP status: Due, Education has been provided regarding the importance of this vaccine. Advised may receive this vaccine at local pharmacy or Health Dept. Aware to provide a copy of the vaccination record if obtained from local pharmacy or Health Dept. Verbalized acceptance and understanding.  Flu Vaccine status: Declined, Education has been provided regarding the importance of this vaccine but patient still declined. Advised may receive this vaccine at local pharmacy or Health Dept. Aware to provide a copy of the vaccination record if obtained from local pharmacy or Health Dept. Verbalized acceptance and understanding.  Pneumococcal vaccine status: Up to date  Covid-19 vaccine status: Declined, Education has been provided regarding the importance of this vaccine but patient still declined. Advised may receive this vaccine at local pharmacy or Health Dept.or vaccine clinic. Aware to provide a copy of the vaccination record if obtained from local pharmacy or Health Dept. Verbalized acceptance and understanding.  Qualifies for Shingles Vaccine? No   Zostavax completed No   Shingrix Completed?: No.    Education has been provided regarding the importance of this vaccine. Patient has been  advised to call insurance company to determine out of pocket expense if they have not yet received this vaccine. Advised may also receive vaccine at local pharmacy or Health Dept. Verbalized acceptance and understanding.  Screening Tests Health Maintenance  Topic Date Due   HIV Screening  Never done   Diabetic kidney evaluation - Urine ACR  Never done   Hepatitis C Screening  Never done   FOOT EXAM  08/13/2021   HEMOGLOBIN A1C  12/02/2021   COVID-19 Vaccine (1 - 2023-24 season) Never done   DTaP/Tdap/Td (2 - Tdap) 01/17/2022   INFLUENZA VACCINE  12/09/2022   Diabetic kidney evaluation - eGFR measurement  11/25/2023   Medicare Annual Wellness (AWV)  12/15/2023   HPV VACCINES  Aged Out   OPHTHALMOLOGY EXAM  Discontinued    Health Maintenance  Health Maintenance Due  Topic Date Due   HIV Screening  Never done   Diabetic kidney evaluation - Urine ACR  Never done   Hepatitis C Screening  Never done   FOOT EXAM  08/13/2021   HEMOGLOBIN A1C  12/02/2021   COVID-19 Vaccine (1 - 2023-24 season) Never done   DTaP/Tdap/Td (2 - Tdap) 01/17/2022   INFLUENZA VACCINE  12/09/2022    Colorectal cancer screening: Referral to GI placed not of age . Pt aware the office will call re: appt.  Lung Cancer Screening: (Low Dose CT Chest recommended if Age 104-80 years, 20 pack-year currently smoking OR have quit w/in 15years.) does not qualify.   Lung Cancer Screening Referral: n/a  Additional Screening:  Hepatitis C Screening: does not qualify;   Vision Screening: Recommended annual ophthalmology exams for early detection of glaucoma and other disorders of the eye. Is the patient up to date with their annual eye exam?  Yes  Who is the provider or what is the name of the office in which the patient attends annual eye exams? Dr.Le  If pt is not established with a provider, would they like to be referred to a provider to establish care? No .   Dental Screening: Recommended annual dental exams for  proper oral hygiene    Community Resource Referral / Chronic Care Management: CRR required this visit?  No   CCM required this visit?  No     Plan:     I have personally reviewed and noted the following in the  patient's chart:   Medical and social history Use of alcohol, tobacco or illicit drugs  Current medications and supplements including opioid prescriptions. Patient is not currently taking opioid prescriptions. Functional ability and status Nutritional status Physical activity Advanced directives List of other physicians Hospitalizations, surgeries, and ER visits in previous 12 months Vitals Screenings to include cognitive, depression, and falls Referrals and appointments  In addition, I have reviewed and discussed with patient certain preventive protocols, quality metrics, and best practice recommendations. A written personalized care plan for preventive services as well as general preventive health recommendations were provided to patient.     Lorrene Reid, LPN   05/15/1094   After Visit Summary: (MyChart) Due to this being a telephonic visit, the after visit summary with patients personalized plan was offered to patient via MyChart   Nurse Notes: Due Tdap Vaccine

## 2022-12-21 ENCOUNTER — Ambulatory Visit (INDEPENDENT_AMBULATORY_CARE_PROVIDER_SITE_OTHER): Payer: Medicare Other

## 2022-12-21 DIAGNOSIS — I428 Other cardiomyopathies: Secondary | ICD-10-CM

## 2022-12-30 ENCOUNTER — Ambulatory Visit: Payer: Medicare Other

## 2023-01-03 ENCOUNTER — Ambulatory Visit: Payer: Medicare Other | Attending: Cardiovascular Disease | Admitting: *Deleted

## 2023-01-03 DIAGNOSIS — Z5181 Encounter for therapeutic drug level monitoring: Secondary | ICD-10-CM | POA: Diagnosis not present

## 2023-01-03 DIAGNOSIS — I480 Paroxysmal atrial fibrillation: Secondary | ICD-10-CM | POA: Diagnosis not present

## 2023-01-03 LAB — POCT INR: INR: 2 (ref 2.0–3.0)

## 2023-01-03 NOTE — Patient Instructions (Signed)
Continue warfarin 1 tablet daily except 1 1/2 tablets on Mondays, Wednesdays and Fridays. Recheck 6 wks 

## 2023-01-05 ENCOUNTER — Other Ambulatory Visit: Payer: Self-pay | Admitting: Cardiovascular Disease

## 2023-01-05 DIAGNOSIS — I4719 Other supraventricular tachycardia: Secondary | ICD-10-CM

## 2023-01-05 NOTE — Progress Notes (Signed)
Remote ICD transmission.   

## 2023-02-14 ENCOUNTER — Ambulatory Visit: Payer: Medicare Other | Attending: Cardiovascular Disease | Admitting: *Deleted

## 2023-02-14 DIAGNOSIS — I480 Paroxysmal atrial fibrillation: Secondary | ICD-10-CM | POA: Diagnosis not present

## 2023-02-14 DIAGNOSIS — Z5181 Encounter for therapeutic drug level monitoring: Secondary | ICD-10-CM | POA: Diagnosis not present

## 2023-02-14 LAB — POCT INR: INR: 3.2 — AB (ref 2.0–3.0)

## 2023-02-14 NOTE — Patient Instructions (Signed)
Take warfarin 1/2 tablet tonight then resume 1 tablet daily except 1 1/2 tablets on Mondays, Wednesdays and Fridays. Recheck 6 wks

## 2023-03-22 ENCOUNTER — Ambulatory Visit (INDEPENDENT_AMBULATORY_CARE_PROVIDER_SITE_OTHER): Payer: Medicare Other

## 2023-03-22 DIAGNOSIS — I428 Other cardiomyopathies: Secondary | ICD-10-CM | POA: Diagnosis not present

## 2023-03-24 LAB — CUP PACEART REMOTE DEVICE CHECK
Battery Remaining Longevity: 74 mo
Battery Remaining Percentage: 71 %
Battery Voltage: 2.99 V
Brady Statistic AP VP Percent: 1 %
Brady Statistic AP VS Percent: 9.8 %
Brady Statistic AS VP Percent: 1 %
Brady Statistic AS VS Percent: 90 %
Brady Statistic RA Percent Paced: 8.9 %
Brady Statistic RV Percent Paced: 1 %
Date Time Interrogation Session: 20241111210212
HighPow Impedance: 37 Ohm
Implantable Lead Connection Status: 753985
Implantable Lead Connection Status: 753985
Implantable Lead Implant Date: 20111010
Implantable Lead Implant Date: 20111010
Implantable Lead Location: 753859
Implantable Lead Location: 753860
Implantable Pulse Generator Implant Date: 20211115
Lead Channel Impedance Value: 390 Ohm
Lead Channel Impedance Value: 440 Ohm
Lead Channel Pacing Threshold Amplitude: 0.75 V
Lead Channel Pacing Threshold Amplitude: 1 V
Lead Channel Pacing Threshold Pulse Width: 0.5 ms
Lead Channel Pacing Threshold Pulse Width: 1 ms
Lead Channel Sensing Intrinsic Amplitude: 11.7 mV
Lead Channel Sensing Intrinsic Amplitude: 2.7 mV
Lead Channel Setting Pacing Amplitude: 2.5 V
Lead Channel Setting Pacing Amplitude: 2.5 V
Lead Channel Setting Pacing Pulse Width: 0.5 ms
Lead Channel Setting Sensing Sensitivity: 0.5 mV
Pulse Gen Serial Number: 111031056
Zone Setting Status: 755011

## 2023-03-28 ENCOUNTER — Ambulatory Visit: Payer: Medicare Other

## 2023-03-30 ENCOUNTER — Ambulatory Visit: Payer: Medicare Other | Attending: Cardiovascular Disease

## 2023-03-30 DIAGNOSIS — I480 Paroxysmal atrial fibrillation: Secondary | ICD-10-CM

## 2023-03-30 DIAGNOSIS — Z7901 Long term (current) use of anticoagulants: Secondary | ICD-10-CM

## 2023-03-30 DIAGNOSIS — Z5181 Encounter for therapeutic drug level monitoring: Secondary | ICD-10-CM | POA: Diagnosis not present

## 2023-03-30 LAB — POCT INR: INR: 2.6 (ref 2.0–3.0)

## 2023-03-30 NOTE — Patient Instructions (Signed)
Description   Continue on same dosage of Warfarin 1 tablet daily except 1 1/2 tablets on Mondays, Wednesdays and Fridays. Recheck 6 wks

## 2023-03-31 ENCOUNTER — Ambulatory Visit: Payer: Medicare Other

## 2023-04-15 NOTE — Progress Notes (Signed)
Remote ICD transmission.   

## 2023-04-22 ENCOUNTER — Other Ambulatory Visit: Payer: Self-pay | Admitting: Cardiovascular Disease

## 2023-04-22 MED ORDER — ENTRESTO 49-51 MG PO TABS: 1.0000 | ORAL_TABLET | Freq: Two times a day (BID) | ORAL | 0 refills | Status: DC

## 2023-04-25 ENCOUNTER — Other Ambulatory Visit: Payer: Self-pay | Admitting: *Deleted

## 2023-04-25 MED ORDER — ENTRESTO 49-51 MG PO TABS: 1.0000 | ORAL_TABLET | Freq: Two times a day (BID) | ORAL | 2 refills | Status: DC

## 2023-04-30 ENCOUNTER — Other Ambulatory Visit: Payer: Self-pay | Admitting: Cardiovascular Disease

## 2023-05-01 ENCOUNTER — Other Ambulatory Visit: Payer: Self-pay | Admitting: Family Medicine

## 2023-05-16 ENCOUNTER — Other Ambulatory Visit: Payer: Self-pay | Admitting: Family Medicine

## 2023-05-17 DIAGNOSIS — L730 Acne keloid: Secondary | ICD-10-CM | POA: Diagnosis not present

## 2023-05-18 ENCOUNTER — Ambulatory Visit: Payer: Medicare Other | Attending: Cardiovascular Disease | Admitting: *Deleted

## 2023-05-18 DIAGNOSIS — Z5181 Encounter for therapeutic drug level monitoring: Secondary | ICD-10-CM | POA: Insufficient documentation

## 2023-05-18 DIAGNOSIS — I480 Paroxysmal atrial fibrillation: Secondary | ICD-10-CM | POA: Diagnosis not present

## 2023-05-18 LAB — POCT INR: INR: 2.8 (ref 2.0–3.0)

## 2023-05-18 NOTE — Patient Instructions (Signed)
 Continue on same dosage of Warfarin 1 tablet daily except 1 1/2 tablets on Mondays, Wednesdays and Fridays. Recheck 6 wks

## 2023-06-21 ENCOUNTER — Ambulatory Visit (INDEPENDENT_AMBULATORY_CARE_PROVIDER_SITE_OTHER): Payer: Medicare Other

## 2023-06-21 DIAGNOSIS — I428 Other cardiomyopathies: Secondary | ICD-10-CM | POA: Diagnosis not present

## 2023-06-21 DIAGNOSIS — I5042 Chronic combined systolic (congestive) and diastolic (congestive) heart failure: Secondary | ICD-10-CM

## 2023-06-21 DIAGNOSIS — L730 Acne keloid: Secondary | ICD-10-CM | POA: Diagnosis not present

## 2023-06-22 LAB — CUP PACEART REMOTE DEVICE CHECK
Battery Remaining Longevity: 72 mo
Battery Remaining Percentage: 69 %
Battery Voltage: 2.99 V
Brady Statistic AP VP Percent: 1 %
Brady Statistic AP VS Percent: 9.5 %
Brady Statistic AS VP Percent: 1 %
Brady Statistic AS VS Percent: 90 %
Brady Statistic RA Percent Paced: 8.6 %
Brady Statistic RV Percent Paced: 1 %
Date Time Interrogation Session: 20250210210111
HighPow Impedance: 42 Ohm
Implantable Lead Connection Status: 753985
Implantable Lead Connection Status: 753985
Implantable Lead Implant Date: 20111010
Implantable Lead Implant Date: 20111010
Implantable Lead Location: 753859
Implantable Lead Location: 753860
Implantable Pulse Generator Implant Date: 20211115
Lead Channel Impedance Value: 400 Ohm
Lead Channel Impedance Value: 450 Ohm
Lead Channel Pacing Threshold Amplitude: 0.75 V
Lead Channel Pacing Threshold Amplitude: 1 V
Lead Channel Pacing Threshold Pulse Width: 0.5 ms
Lead Channel Pacing Threshold Pulse Width: 1 ms
Lead Channel Sensing Intrinsic Amplitude: 11.7 mV
Lead Channel Sensing Intrinsic Amplitude: 3.2 mV
Lead Channel Setting Pacing Amplitude: 2.5 V
Lead Channel Setting Pacing Amplitude: 2.5 V
Lead Channel Setting Pacing Pulse Width: 0.5 ms
Lead Channel Setting Sensing Sensitivity: 0.5 mV
Pulse Gen Serial Number: 111031056
Zone Setting Status: 755011

## 2023-06-30 ENCOUNTER — Encounter: Payer: Self-pay | Admitting: Cardiovascular Disease

## 2023-06-30 ENCOUNTER — Ambulatory Visit: Payer: Medicare Other | Admitting: Family Medicine

## 2023-06-30 VITALS — BP 94/61 | HR 54 | Temp 100.4°F | Ht 77.0 in | Wt 246.0 lb

## 2023-06-30 DIAGNOSIS — J029 Acute pharyngitis, unspecified: Secondary | ICD-10-CM | POA: Diagnosis not present

## 2023-06-30 DIAGNOSIS — R0981 Nasal congestion: Secondary | ICD-10-CM | POA: Diagnosis not present

## 2023-06-30 DIAGNOSIS — R051 Acute cough: Secondary | ICD-10-CM | POA: Diagnosis not present

## 2023-06-30 DIAGNOSIS — R509 Fever, unspecified: Secondary | ICD-10-CM

## 2023-06-30 DIAGNOSIS — R6889 Other general symptoms and signs: Secondary | ICD-10-CM | POA: Diagnosis not present

## 2023-06-30 LAB — VERITOR FLU A/B WAIVED
Influenza A: NEGATIVE
Influenza B: NEGATIVE

## 2023-06-30 MED ORDER — BENZONATATE 100 MG PO CAPS: 100.0000 mg | ORAL_CAPSULE | Freq: Three times a day (TID) | ORAL | 0 refills | Status: DC | PRN

## 2023-06-30 MED ORDER — FLUTICASONE PROPIONATE 50 MCG/ACT NA SUSP: 1.0000 | NASAL | 11 refills | Status: AC | PRN

## 2023-06-30 NOTE — Progress Notes (Signed)
Acute Office Visit  Subjective:     Patient ID: Danny Hinton, male    DOB: 1981-03-11, 43 y.o.   MRN: [id number]  Chief Complaint  Patient presents with   Sore Throat   Cough   Nasal Congestion    Sore Throat  This is a new problem. The current episode started yesterday. The problem has been gradually worsening. The maximum temperature recorded prior to his arrival was 100.4 - 100.9 F. Associated symptoms include congestion and coughing (dry). Pertinent negatives include no abdominal pain, diarrhea, ear pain, headaches, hoarse voice, shortness of breath, swollen glands, trouble swallowing or vomiting. Associated symptoms comments: fatigue. Exposure to: flu- 3 weeks ago. He has tried acetaminophen (cloricidin) for the symptoms. The treatment provided mild relief.     Review of Systems  HENT:  Positive for congestion. Negative for ear pain, hoarse voice and trouble swallowing.   Respiratory:  Positive for cough (dry). Negative for shortness of breath.   Gastrointestinal:  Negative for abdominal pain, diarrhea and vomiting.  Neurological:  Negative for headaches.        Objective:    BP 94/61   Pulse (!) 54   Temp (!) 100.4 F (38 C)   Ht 6\' 5"  (1.956 m)   Wt 246 lb (111.6 kg)   SpO2 98%   BMI 29.17 kg/m  BP Readings from Last 3 Encounters:  06/30/23 94/61  11/25/22 (!) 90/58  09/30/22 116/68      Physical Exam Vitals and nursing note reviewed.  Constitutional:      General: He is not in acute distress.    Appearance: He is not ill-appearing, toxic-appearing or diaphoretic.  HENT:     Head: Normocephalic and atraumatic.     Right Ear: Tympanic membrane and ear canal normal.     Left Ear: Tympanic membrane and ear canal normal.     Nose: Congestion present.     Mouth/Throat:     Mouth: Mucous membranes are moist. No oral lesions.     Pharynx: Posterior oropharyngeal erythema present. No pharyngeal swelling, oropharyngeal exudate or uvula swelling.     Tonsils:  No tonsillar exudate or tonsillar abscesses. 1+ on the right. 1+ on the left.  Eyes:     Conjunctiva/sclera: Conjunctivae normal.     Pupils: Pupils are equal, round, and reactive to light.  Cardiovascular:     Rate and Rhythm: Normal rate and regular rhythm.     Heart sounds: Normal heart sounds. No murmur heard. Pulmonary:     Effort: Pulmonary effort is normal. No respiratory distress.     Breath sounds: Normal breath sounds. No wheezing, rhonchi or rales.  Abdominal:     General: Bowel sounds are normal.     Palpations: Abdomen is soft.  Musculoskeletal:     Cervical back: Neck supple.  Lymphadenopathy:     Cervical: No cervical adenopathy.  Skin:    General: Skin is warm and dry.  Neurological:     General: No focal deficit present.     Mental Status: He is alert and oriented to person, place, and time.  Psychiatric:        Mood and Affect: Mood normal.        Behavior: Behavior normal.     No results found for any visits on 06/30/23.      Assessment & Plan:   Danny was seen today for sore throat, cough and nasal congestion.  Diagnoses and all orders for this visit:  Fever,  unspecified fever cause -     Veritor Flu A/B Waived -     COVID-19, Flu A+B and RSV  Sore throat -     Veritor Flu A/B Waived -     COVID-19, Flu A+B and RSV  Nasal congestion -     Veritor Flu A/B Waived -     COVID-19, Flu A+B and RSV -     fluticasone (FLONASE) 50 MCG/ACT nasal spray; Place 1 spray into both nostrils as needed.  Acute cough -     benzonatate (TESSALON PERLES) 100 MG capsule; Take 1 capsule (100 mg total) by mouth 3 (three) times daily as needed.   Discussed viral etiology. Rapid flu negative today. Covid/flu/RSV pending. Discussed antiviral treatment if positive for Covid or flu. Tessaon perles prn for cough. Flonase as needed for congestion. Discussed symptomatic care and return precautions.    Gabriel Earing, FNP

## 2023-07-01 LAB — COVID-19, FLU A+B AND RSV
Influenza A, NAA: NOT DETECTED
Influenza B, NAA: NOT DETECTED
RSV, NAA: NOT DETECTED
SARS-CoV-2, NAA: NOT DETECTED

## 2023-07-04 ENCOUNTER — Encounter: Payer: Self-pay | Admitting: Family Medicine

## 2023-07-26 ENCOUNTER — Other Ambulatory Visit: Payer: Self-pay | Admitting: Cardiovascular Disease

## 2023-07-26 NOTE — Telephone Encounter (Addendum)
 Warfarin 5mg  refill Af[facility] Last INR 05/18/23 & was due 06/30/23 but canceled appt and has not rescheduled Last OV 11/25/22  Spoke with pt and set up appt. He does have enough warfarin to last beyond appt but will need refill soon. Advised will have the refill addressed at appointment tomorrow and he verbalized understanding.

## 2023-07-27 ENCOUNTER — Ambulatory Visit: Attending: Cardiovascular Disease | Admitting: *Deleted

## 2023-07-27 DIAGNOSIS — I480 Paroxysmal atrial fibrillation: Secondary | ICD-10-CM | POA: Diagnosis not present

## 2023-07-27 DIAGNOSIS — Z5181 Encounter for therapeutic drug level monitoring: Secondary | ICD-10-CM | POA: Diagnosis not present

## 2023-07-27 LAB — POCT INR: INR: 3.9 — AB (ref 2.0–3.0)

## 2023-07-27 NOTE — Patient Instructions (Signed)
 Hold warfarin tonight then resume 1 tablet daily except 1 1/2 tablets on Mondays, Wednesdays and Fridays. Recheck 3 wks

## 2023-07-27 NOTE — Telephone Encounter (Signed)
 Pt came for INR check this morning.  Coumadin refill sent to pharmacy.

## 2023-08-01 NOTE — Progress Notes (Signed)
 Remote ICD transmission.

## 2023-08-01 NOTE — Addendum Note (Signed)
 Addended by: Geralyn Flash D on: 08/01/2023 04:48 PM   Modules accepted: Orders

## 2023-08-11 ENCOUNTER — Other Ambulatory Visit: Payer: Self-pay | Admitting: Family Medicine

## 2023-08-16 ENCOUNTER — Ambulatory Visit

## 2023-08-23 ENCOUNTER — Ambulatory Visit: Attending: Cardiovascular Disease | Admitting: *Deleted

## 2023-08-23 DIAGNOSIS — Z5181 Encounter for therapeutic drug level monitoring: Secondary | ICD-10-CM | POA: Insufficient documentation

## 2023-08-23 DIAGNOSIS — I480 Paroxysmal atrial fibrillation: Secondary | ICD-10-CM | POA: Insufficient documentation

## 2023-08-23 LAB — POCT INR: INR: 3 (ref 2.0–3.0)

## 2023-08-23 NOTE — Patient Instructions (Signed)
Continue warfarin 1 tablet daily except 1 1/2 tablets on Mondays, Wednesdays and Fridays. Recheck 4 wks

## 2023-09-07 DIAGNOSIS — K912 Postsurgical malabsorption, not elsewhere classified: Secondary | ICD-10-CM | POA: Diagnosis not present

## 2023-09-07 DIAGNOSIS — E782 Mixed hyperlipidemia: Secondary | ICD-10-CM | POA: Diagnosis not present

## 2023-09-07 DIAGNOSIS — I10 Essential (primary) hypertension: Secondary | ICD-10-CM | POA: Diagnosis not present

## 2023-09-07 DIAGNOSIS — E559 Vitamin D deficiency, unspecified: Secondary | ICD-10-CM | POA: Diagnosis not present

## 2023-09-07 DIAGNOSIS — E119 Type 2 diabetes mellitus without complications: Secondary | ICD-10-CM | POA: Diagnosis not present

## 2023-09-07 DIAGNOSIS — Z9884 Bariatric surgery status: Secondary | ICD-10-CM | POA: Diagnosis not present

## 2023-09-07 DIAGNOSIS — G4733 Obstructive sleep apnea (adult) (pediatric): Secondary | ICD-10-CM | POA: Diagnosis not present

## 2023-09-09 ENCOUNTER — Ambulatory Visit: Admitting: Family Medicine

## 2023-09-09 ENCOUNTER — Telehealth (INDEPENDENT_AMBULATORY_CARE_PROVIDER_SITE_OTHER): Admitting: Nurse Practitioner

## 2023-09-09 DIAGNOSIS — J0110 Acute frontal sinusitis, unspecified: Secondary | ICD-10-CM

## 2023-09-09 MED ORDER — AMOXICILLIN-POT CLAVULANATE 875-125 MG PO TABS: 1.0000 | ORAL_TABLET | Freq: Two times a day (BID) | ORAL | 0 refills | Status: DC

## 2023-09-09 MED ORDER — CHLORPHEN-PE-ACETAMINOPHEN 4-10-325 MG PO TABS: 1.0000 | ORAL_TABLET | Freq: Four times a day (QID) | ORAL | 0 refills | Status: AC | PRN

## 2023-09-09 NOTE — Progress Notes (Signed)
 Virtual Visit Consent   Danny E Pablo, you are scheduled for a virtual visit with Mary-Margaret Gaylyn Keas, FNP, a [facility] provider, today.     Just as with appointments in the office, your consent must be obtained to participate.  Your consent will be active for this visit and any virtual visit you may have with one of our providers in the next 365 days.     If you have a MyChart account, a copy of this consent can be sent to you electronically.  All virtual visits are billed to your insurance company just like a traditional visit in the office.    As this is a virtual visit, video technology does not allow for your provider to perform a traditional examination.  This may limit your provider's ability to fully assess your condition.  If your provider identifies any concerns that need to be evaluated in person or the need to arrange testing (such as labs, EKG, etc.), we will make arrangements to do so.     Although advances in technology are sophisticated, we cannot ensure that it will always work on either your end or our end.  If the connection with a video visit is poor, the visit may have to be switched to a telephone visit.  With either a video or telephone visit, we are not always able to ensure that we have a secure connection.     I need to obtain your verbal consent now.   Are you willing to proceed with your visit today? YES   Danny Hinton has provided verbal consent on 09/09/2023 for a virtual visit (video or telephone).   Mary-Margaret Gaylyn Keas, FNP   Date: 09/09/2023 8:19 AM   Virtual Visit via Video Note   I, Mary-Margaret Gaylyn Keas, connected with Danny Hinton ([id number], 06/02/1980) on 09/09/23 at 10:50 AM EDT by a video-enabled telemedicine application and verified that I am speaking with the correct person using two identifiers.  Location: Patient: Virtual Visit Location Patient: Home Provider: Virtual Visit Location Provider: Mobile   I discussed the limitations of evaluation  and management by telemedicine and the availability of in person appointments. The patient expressed understanding and agreed to proceed.    History of Present Illness: Danny Hinton is a 43 y.o. who identifies as a male who was assigned male at birth, and is being seen today for sinusitis .  HPI: Sinusitis This is a new problem. The current episode started in the past 7 days. The problem has been waxing and waning since onset. There has been no fever. His pain is at a severity of 6/10. Associated symptoms include sinus pressure, sneezing and a sore throat. Past treatments include nasal decongestants (antihistamine). The treatment provided mild relief.    Review of Systems  HENT:  Positive for sinus pressure, sneezing and sore throat.     Problems:  Patient Active Problem List   Diagnosis Date Noted   ICD (implantable cardioverter-defibrillator) battery depletion 03/24/2020   Vitamin D  deficiency, vitamin D  = 6.9 (03/28/19) 04/29/2019   Diabetes mellitus (HCC), Rx Victoza  04/29/2019   B12 nutritional deficiency, B12 = 214 (03/18/19) 04/29/2019   Dyslipidemia associated with type 2 diabetes mellitus (HCC), Rx Lipitor 04/29/2019   Depression, reactive, Rx Wellbutrin , Zoloft  04/29/2019   Aneurysm of ascending aorta without rupture (HCC) 04/29/2019   OSA treated with BiPAP 09/04/2018   Metabolic syndrome 03/26/2015   Mixed hyperlipidemia 02/10/2015   Chronic combined systolic and diastolic heart failure (HCC)  Essential hypertension    Non-ischemic cardiomyopathy (HCC)    Automatic implantable cardioverter-defibrillator in situ    Paroxysmal atrial fibrillation (HCC) 07/18/2012   Long term current use of anticoagulant therapy, Coumadin  07/18/2012    Allergies: No Known Allergies Medications:  Current Outpatient Medications:    atorvastatin  (LIPITOR) 40 MG tablet, Take 1 tablet (40 mg total) by mouth daily., Disp: 90 tablet, Rfl: 0   benzonatate  (TESSALON  PERLES) 100 MG capsule, Take 1  capsule (100 mg total) by mouth 3 (three) times daily as needed., Disp: 20 capsule, Rfl: 0   carvedilol  (COREG ) 25 MG tablet, TAKE 1/2 (ONE-HALF) TABLET BY MOUTH TWICE DAILY WITH A MEAL, Disp: 90 tablet, Rfl: 3   FLUoxetine  (PROZAC ) 20 MG tablet, Take 20 mg by mouth daily., Disp: , Rfl:    fluticasone  (FLONASE ) 50 MCG/ACT nasal spray, Place 1 spray into both nostrils as needed., Disp: 16 g, Rfl: 11   furosemide  (LASIX ) 40 MG tablet, TAKE 1 TABLET BY MOUTH ONCE DAILY AS NEEDED. KEEP OFFICE VISIT, Disp: 90 tablet, Rfl: 3   loratadine  (EQ ALL DAY ALLERGY RELIEF) 10 MG tablet, Take 1 tablet by mouth in the evening, Disp: 90 tablet, Rfl: 1   metoprolol  tartrate (LOPRESSOR ) 25 MG tablet, Take 1 tablet as needed for palpitations, Disp: 90 tablet, Rfl: 0   Multiple Vitamin (MULTIVITAMIN WITH MINERALS) TABS tablet, Take 1 tablet by mouth daily., Disp: , Rfl:    Probiotic Product (VSL#3) CAPS, Take twice a day, Disp:  , Rfl:    sacubitril -valsartan  (ENTRESTO ) 49-51 MG, Take 1 tablet by mouth 2 (two) times daily. KEEP OV., Disp: 180 tablet, Rfl: 2   Vitamin D , Ergocalciferol , (DRISDOL ) 1.25 MG (50000 UNIT) CAPS capsule, Take 1 capsule (50,000 Units total) by mouth every 7 (seven) days. (Patient taking differently: Take 50,000 Units by mouth 2 (two) times a week. Tues and Thurs), Disp: 12 capsule, Rfl: 0   warfarin (COUMADIN ) 5 MG tablet, TAKE 1 TO 1 & 1/2 (ONE & ONE-HALF) TABLETS BY MOUTH ONCE DAILY OR AS DIRECTED BY COUMADIN  CLINIC, Disp: 135 tablet, Rfl: 1  Observations/Objective: Patient is well-developed, well-nourished in no acute distress.  Resting comfortably  at home.  Head is normocephalic, atraumatic.  No labored breathing.  Speech is clear and coherent with logical content.  Patient is alert and oriented at baseline.  Frontal sinus pressure bil  Assessment and Plan:  Danny Hinton in today with chief complaint of Sinusitis   1. Acute non-recurrent frontal sinusitis (Primary) 1. Take meds as  prescribed 2. Use a cool mist humidifier especially during the winter months and when heat has been humid. 3. Use saline nose sprays frequently 4. Saline irrigations of the nose can be very helpful if done frequently.  * 4X daily for 1 week*  * Use of a nettie pot can be helpful with this. Follow directions with this* 5. Drink plenty of fluids 6. Keep thermostat turn down low 7.For any cough or congestion- norel AD 8. For fever or aces or pains- take tylenol  or ibuprofen appropriate for age and weight.  * for fevers greater than 101 orally you may alternate ibuprofen and tylenol  every  3 hours.    - amoxicillin -clavulanate (AUGMENTIN ) 875-125 MG tablet; Take 1 tablet by mouth 2 (two) times daily.  Dispense: 14 tablet; Refill: 0 - Chlorphen-PE-Acetaminophen  4-10-325 MG TABS; Take 1 tablet by mouth every 6 (six) hours as needed.  Dispense: 20 tablet; Refill: 0   Follow Up Instructions: I discussed the  assessment and treatment plan with the patient. The patient was provided an opportunity to ask questions and all were answered. The patient agreed with the plan and demonstrated an understanding of the instructions.  A copy of instructions were sent to the patient via MyChart.  The patient was advised to call back or seek an in-person evaluation if the symptoms worsen or if the condition fails to improve as anticipated.  Time:  I spent 8 minutes with the patient via telehealth technology discussing the above problems/concerns.    Mary-Margaret Gaylyn Keas, FNP

## 2023-09-09 NOTE — Patient Instructions (Signed)
1. Take meds as prescribed ?2. Use a cool mist humidifier especially during the winter months and when heat has been humid. ?3. Use saline nose sprays frequently ?4. Saline irrigations of the nose can be very helpful if done frequently. ? * 4X daily for 1 week* ? * Use of a nettie pot can be helpful with this. Follow directions with this* ?5. Drink plenty of fluids ?6. Keep thermostat turn down low ?7.For any cough or congestion- norel AD ?8. For fever or aces or pains- take tylenol or ibuprofen appropriate for age and weight. ? * for fevers greater than 101 orally you may alternate ibuprofen and tylenol every  3 hours. ?  ? ?

## 2023-09-20 ENCOUNTER — Ambulatory Visit: Attending: Cardiovascular Disease | Admitting: *Deleted

## 2023-09-20 ENCOUNTER — Ambulatory Visit (INDEPENDENT_AMBULATORY_CARE_PROVIDER_SITE_OTHER): Payer: Medicaid Other

## 2023-09-20 DIAGNOSIS — I428 Other cardiomyopathies: Secondary | ICD-10-CM | POA: Diagnosis not present

## 2023-09-20 DIAGNOSIS — Z5181 Encounter for therapeutic drug level monitoring: Secondary | ICD-10-CM | POA: Diagnosis not present

## 2023-09-20 DIAGNOSIS — I480 Paroxysmal atrial fibrillation: Secondary | ICD-10-CM

## 2023-09-20 LAB — CUP PACEART REMOTE DEVICE CHECK
Battery Remaining Longevity: 70 mo
Battery Remaining Percentage: 67 %
Battery Voltage: 2.99 V
Brady Statistic AP VP Percent: 1 %
Brady Statistic AP VS Percent: 9.9 %
Brady Statistic AS VP Percent: 1 %
Brady Statistic AS VS Percent: 90 %
Brady Statistic RA Percent Paced: 9 %
Brady Statistic RV Percent Paced: 1 %
Date Time Interrogation Session: 20250512220106
HighPow Impedance: 39 Ohm
Implantable Lead Connection Status: 753985
Implantable Lead Connection Status: 753985
Implantable Lead Implant Date: 20111010
Implantable Lead Implant Date: 20111010
Implantable Lead Location: 753859
Implantable Lead Location: 753860
Implantable Pulse Generator Implant Date: 20211115
Lead Channel Impedance Value: 400 Ohm
Lead Channel Impedance Value: 430 Ohm
Lead Channel Pacing Threshold Amplitude: 0.75 V
Lead Channel Pacing Threshold Amplitude: 1 V
Lead Channel Pacing Threshold Pulse Width: 0.5 ms
Lead Channel Pacing Threshold Pulse Width: 1 ms
Lead Channel Sensing Intrinsic Amplitude: 11.7 mV
Lead Channel Sensing Intrinsic Amplitude: 3.3 mV
Lead Channel Setting Pacing Amplitude: 2.5 V
Lead Channel Setting Pacing Amplitude: 2.5 V
Lead Channel Setting Pacing Pulse Width: 0.5 ms
Lead Channel Setting Sensing Sensitivity: 0.5 mV
Pulse Gen Serial Number: 111031056
Zone Setting Status: 755011

## 2023-09-20 LAB — POCT INR: INR: 2.8 (ref 2.0–3.0)

## 2023-09-20 NOTE — Patient Instructions (Signed)
Continue warfarin 1 tablet daily except 1 1/2 tablets on Mondays, Wednesdays and Fridays. Recheck 4 wks

## 2023-09-21 ENCOUNTER — Ambulatory Visit: Payer: Self-pay | Admitting: Cardiovascular Disease

## 2023-09-21 ENCOUNTER — Telehealth: Payer: Self-pay

## 2023-09-21 NOTE — Telephone Encounter (Signed)
 Copied from CRM [id number]. Topic: Clinical - Request for Lab/Test Order >> Sep 21, 2023 12:19 PM Carrielelia G wrote: Attn: Carolann Chum  Reason for CRM: patient is requesting a  B12 injection due to it being low, per [facility] (5/1)  in care everywhere.  Patient also mentioned his cholesterol levels.   Please advise

## 2023-09-21 NOTE — Telephone Encounter (Signed)
 Please call and schedule an appt with Dr. Steen Eden. He has not seen the pt since May of 2024. All recent visits have been acute appts.

## 2023-09-21 NOTE — Telephone Encounter (Signed)
 Spoke with pt and got him scheduled to see Dr Steen Eden on 5/30 (first available).

## 2023-09-29 ENCOUNTER — Encounter: Payer: Self-pay | Admitting: Family Medicine

## 2023-09-29 ENCOUNTER — Ambulatory Visit: Admitting: Family Medicine

## 2023-09-29 VITALS — BP 88/58 | HR 58 | Ht 77.0 in | Wt 251.0 lb

## 2023-09-29 DIAGNOSIS — E538 Deficiency of other specified B group vitamins: Secondary | ICD-10-CM

## 2023-09-29 DIAGNOSIS — E782 Mixed hyperlipidemia: Secondary | ICD-10-CM

## 2023-09-29 DIAGNOSIS — E1169 Type 2 diabetes mellitus with other specified complication: Secondary | ICD-10-CM

## 2023-09-29 DIAGNOSIS — Z7984 Long term (current) use of oral hypoglycemic drugs: Secondary | ICD-10-CM | POA: Diagnosis not present

## 2023-09-29 DIAGNOSIS — I10 Essential (primary) hypertension: Secondary | ICD-10-CM | POA: Diagnosis not present

## 2023-09-29 DIAGNOSIS — E785 Hyperlipidemia, unspecified: Secondary | ICD-10-CM | POA: Diagnosis not present

## 2023-09-29 DIAGNOSIS — I5042 Chronic combined systolic (congestive) and diastolic (congestive) heart failure: Secondary | ICD-10-CM | POA: Diagnosis not present

## 2023-09-29 DIAGNOSIS — E559 Vitamin D deficiency, unspecified: Secondary | ICD-10-CM | POA: Diagnosis not present

## 2023-09-29 DIAGNOSIS — I480 Paroxysmal atrial fibrillation: Secondary | ICD-10-CM | POA: Diagnosis not present

## 2023-09-29 DIAGNOSIS — I110 Hypertensive heart disease with heart failure: Secondary | ICD-10-CM

## 2023-09-29 LAB — BAYER DCA HB A1C WAIVED: HB A1C (BAYER DCA - WAIVED): 5 % (ref 4.8–5.6)

## 2023-09-29 MED ORDER — CYANOCOBALAMIN 1000 MCG/ML IJ SOLN
1000.0000 ug | INTRAMUSCULAR | Status: AC
  Administered 2023-09-29 – 2023-12-12 (×3): 1000 ug via INTRAMUSCULAR

## 2023-09-29 NOTE — Progress Notes (Signed)
 BP (!) 88/58   Pulse (!) 58   Ht 6\' 5"  (1.956 m)   Wt 251 lb (113.9 kg)   SpO2 98%   BMI 29.76 kg/m    Subjective:   Patient ID: Danny Hinton, male    DOB: 01/05/1981, 43 y.o.   MRN: [id number]  HPI: Danny E Prosperi is a 43 y.o. male presenting on 09/29/2023 for Medical Management of Chronic Issues, Diabetes, and Atrial Fibrillation   HPI Hypertension Patient is currently on carvedilol  and Entresto  and metoprolol , and their blood pressure today is 88/58. Patient denies any lightheadedness or dizziness. Patient denies headaches, blurred vision, chest pains, shortness of breath, or weakness. Denies any side effects from medication and is content with current medication.  Blood pressure is on the lower side today but he says he normally runs on the low side and he used to blackout from that but they lowered the medicines and he does not blackout or pass out anymore.  Type 2 diabetes mellitus Patient comes in today for recheck of his diabetes. Patient has been currently taking no medicine currently, has been good since he had his gastric bypass. Patient is currently on an ACE inhibitor/ARB. Patient has not seen an ophthalmologist this year. Patient denies any new issues with their feet. The symptom started onset as an adult hyperlipidemia and hypertension and CHF and A-fib ARE RELATED TO DM   Hyperlipidemia Patient is coming in for recheck of his hyperlipidemia. The patient is currently taking Lipitor. They deny any issues with myalgias or history of liver damage from it. They deny any focal numbness or weakness or chest pain.   CHF and A-fib Patient has CHF and A-fib and sees cardiology and is currently on carvedilol  and furosemide  and metoprolol  and Entresto , looks like they are prescribed by Dr. Alvis Ba  B12 deficiency with history of gastric bypass Patient has B12 deficiency, he had a history of gastric bypass couple years ago.  Relevant past medical, surgical, family and social history  reviewed and updated as indicated. Interim medical history since our last visit reviewed. Allergies and medications reviewed and updated.  Review of Systems  Constitutional:  Negative for chills and fever.  Eyes:  Negative for visual disturbance.  Respiratory:  Negative for shortness of breath and wheezing.   Cardiovascular:  Negative for chest pain and leg swelling.  Musculoskeletal:  Negative for back pain and gait problem.  Skin:  Negative for rash.  Neurological:  Negative for dizziness and light-headedness.  All other systems reviewed and are negative.   Per HPI unless specifically indicated above   Allergies as of 09/29/2023   No Known Allergies      Medication List        Accurate as of Sep 29, 2023 10:30 AM. If you have any questions, ask your nurse or doctor.          STOP taking these medications    amoxicillin -clavulanate 875-125 MG tablet Commonly known as: AUGMENTIN  Stopped by: Lucio Sabin Kamesha Herne       TAKE these medications    atorvastatin  40 MG tablet Commonly known as: LIPITOR Take 1 tablet (40 mg total) by mouth daily.   benzonatate  100 MG capsule Commonly known as: Tessalon  Perles Take 1 capsule (100 mg total) by mouth 3 (three) times daily as needed.   carvedilol  25 MG tablet Commonly known as: COREG  TAKE 1/2 (ONE-HALF) TABLET BY MOUTH TWICE DAILY WITH A MEAL   Chlorphen-PE-Acetaminophen  4-10-325 MG Tabs Take 1 tablet  by mouth every 6 (six) hours as needed.   Entresto  49-51 MG Generic drug: sacubitril -valsartan  Take 1 tablet by mouth 2 (two) times daily. KEEP OV.   EQ All Day Allergy Relief 10 MG tablet Generic drug: loratadine  Take 1 tablet by mouth in the evening   FLUoxetine  20 MG tablet Commonly known as: PROZAC  Take 20 mg by mouth daily.   fluticasone  50 MCG/ACT nasal spray Commonly known as: FLONASE  Place 1 spray into both nostrils as needed.   furosemide  40 MG tablet Commonly known as: LASIX  TAKE 1 TABLET BY MOUTH  ONCE DAILY AS NEEDED. KEEP OFFICE VISIT   metoprolol  tartrate 25 MG tablet Commonly known as: LOPRESSOR  Take 1 tablet as needed for palpitations   multivitamin with minerals Tabs tablet Take 1 tablet by mouth daily.   Vitamin D  (Ergocalciferol ) 1.25 MG (50000 UNIT) Caps capsule Commonly known as: DRISDOL  Take 1 capsule (50,000 Units total) by mouth every 7 (seven) days. What changed:  when to take this additional instructions   VSL#3 Caps Take twice a day   warfarin 5 MG tablet Commonly known as: COUMADIN  Take as directed by the anticoagulation clinic. If you are unsure how to take this medication, talk to your nurse or doctor. Original instructions: TAKE 1 TO 1 & 1/2 (ONE & ONE-HALF) TABLETS BY MOUTH ONCE DAILY OR AS DIRECTED BY COUMADIN  CLINIC         Objective:   BP (!) 88/58   Pulse (!) 58   Ht 6\' 5"  (1.956 m)   Wt 251 lb (113.9 kg)   SpO2 98%   BMI 29.76 kg/m   Wt Readings from Last 3 Encounters:  09/29/23 251 lb (113.9 kg)  06/30/23 246 lb (111.6 kg)  12/15/22 236 lb (107 kg)    Physical Exam Vitals and nursing note reviewed.  Constitutional:      General: He is not in acute distress.    Appearance: He is well-developed. He is not diaphoretic.  Eyes:     General: No scleral icterus.    Conjunctiva/sclera: Conjunctivae normal.  Neck:     Thyroid : No thyromegaly.  Cardiovascular:     Rate and Rhythm: Normal rate and regular rhythm.     Heart sounds: Normal heart sounds. No murmur heard. Pulmonary:     Effort: Pulmonary effort is normal. No respiratory distress.     Breath sounds: Normal breath sounds. No wheezing.  Musculoskeletal:        General: No swelling. Normal range of motion.     Cervical back: Neck supple.  Lymphadenopathy:     Cervical: No cervical adenopathy.  Skin:    General: Skin is warm and dry.     Findings: No rash.  Neurological:     Mental Status: He is alert and oriented to person, place, and time.     Coordination:  Coordination normal.  Psychiatric:        Behavior: Behavior normal.       Assessment & Plan:   Problem List Items Addressed This Visit       Cardiovascular and Mediastinum   Chronic combined systolic and diastolic heart failure (HCC)   Essential hypertension   Relevant Orders   Bayer DCA Hb A1c Waived   CBC with Differential/Platelet   CMP14+EGFR   Lipid panel   Paroxysmal atrial fibrillation (HCC)     Endocrine   Diabetes mellitus (HCC), Rx Victoza    Dyslipidemia associated with type 2 diabetes mellitus (HCC), Rx Lipitor   Relevant Orders  Bayer DCA Hb A1c Waived     Other   Mixed hyperlipidemia   Relevant Orders   Bayer DCA Hb A1c Waived   CBC with Differential/Platelet   CMP14+EGFR   Lipid panel   B12 nutritional deficiency, B12 = 214 (03/18/19)   Relevant Medications   cyanocobalamin  (VITAMIN B12) injection 1,000 mcg   Other Relevant Orders   Vitamin B12   Vitamin D  deficiency, vitamin D  = 6.9 (03/28/19) - Primary   Relevant Orders   VITAMIN D  25 Hydroxy (Vit-D Deficiency, Fractures)    Patient coming in to reestablish care has been sometime since he has been in here.  He had a gastric bypass, he thinks Roux-en-Y a couple years ago.  His B12 was recently checked a month ago and instructed him to come here for B12 injections which we are going to start today.  His A1c looks good at 5.0.  He says he does not get lightheaded and dizzy anymore with his blood pressure at this level has been guarding there and he does work with cardiology closely for this.  He sees Coumadin  clinic for his INR's and has been managed well. Follow up plan: Return in about 3 months (around 12/30/2023), or if symptoms worsen or fail to improve, for Hypertension and CHF recheck and B12 recheck.  Counseling provided for all of the vaccine components Orders Placed This Encounter  Procedures   Bayer DCA Hb A1c Waived   CBC with Differential/Platelet   CMP14+EGFR   Lipid panel   Vitamin  B12   VITAMIN D  25 Hydroxy (Vit-D Deficiency, Fractures)    Jolyne Needs, MD [facility] 09/29/2023, 10:30 AM

## 2023-09-30 LAB — CMP14+EGFR
ALT: 20 IU/L (ref 0–44)
AST: 16 IU/L (ref 0–40)
Albumin: 4.2 g/dL (ref 4.1–5.1)
Alkaline Phosphatase: 72 IU/L (ref 44–121)
BUN/Creatinine Ratio: 12 (ref 9–20)
BUN: 9 mg/dL (ref 6–24)
Bilirubin Total: 1 mg/dL (ref 0.0–1.2)
CO2: 22 mmol/L (ref 20–29)
Calcium: 8.9 mg/dL (ref 8.7–10.2)
Chloride: 103 mmol/L (ref 96–106)
Creatinine, Ser: 0.77 mg/dL (ref 0.76–1.27)
Globulin, Total: 2.4 g/dL (ref 1.5–4.5)
Glucose: 87 mg/dL (ref 70–99)
Potassium: 4.2 mmol/L (ref 3.5–5.2)
Sodium: 140 mmol/L (ref 134–144)
Total Protein: 6.6 g/dL (ref 6.0–8.5)
eGFR: 115 mL/min/{1.73_m2} (ref >59)

## 2023-09-30 LAB — CBC WITH DIFFERENTIAL/PLATELET
Basophils Absolute: 0 10*3/uL (ref 0.0–0.2)
Basos: 2 %
EOS (ABSOLUTE): 0.2 10*3/uL (ref 0.0–0.4)
Eos: 7 %
Hematocrit: 41.1 % (ref 37.5–51.0)
Hemoglobin: 13.3 g/dL (ref 13.0–17.7)
Immature Grans (Abs): 0 10*3/uL (ref 0.0–0.1)
Immature Granulocytes: 0 %
Lymphocytes Absolute: 1.4 10*3/uL (ref 0.7–3.1)
Lymphs: 53 %
MCH: 28.1 pg (ref 26.6–33.0)
MCHC: 32.4 g/dL (ref 31.5–35.7)
MCV: 87 fL (ref 79–97)
Monocytes Absolute: 0.3 10*3/uL (ref 0.1–0.9)
Monocytes: 12 %
Neutrophils Absolute: 0.7 10*3/uL — ABNORMAL LOW (ref 1.4–7.0)
Neutrophils: 26 %
Platelets: 229 10*3/uL (ref 150–450)
RBC: 4.74 x10E6/uL (ref 4.14–5.80)
RDW: 12.9 % (ref 11.6–15.4)
WBC: 2.7 10*3/uL — ABNORMAL LOW (ref 3.4–10.8)

## 2023-09-30 LAB — VITAMIN D 25 HYDROXY (VIT D DEFICIENCY, FRACTURES): Vit D, 25-Hydroxy: 46.1 ng/mL (ref 30.0–100.0)

## 2023-09-30 LAB — VITAMIN B12: Vitamin B-12: 186 pg/mL — ABNORMAL LOW (ref 232–1245)

## 2023-09-30 LAB — LIPID PANEL
Chol/HDL Ratio: 3.8 ratio (ref 0.0–5.0)
Cholesterol, Total: 164 mg/dL (ref 100–199)
HDL: 43 mg/dL (ref >39)
LDL Chol Calc (NIH): 105 mg/dL — ABNORMAL HIGH (ref 0–99)
Triglycerides: 83 mg/dL (ref 0–149)
VLDL Cholesterol Cal: 16 mg/dL (ref 5–40)

## 2023-10-07 ENCOUNTER — Ambulatory Visit: Admitting: Family Medicine

## 2023-10-07 ENCOUNTER — Ambulatory Visit: Payer: Self-pay | Admitting: Family Medicine

## 2023-10-18 ENCOUNTER — Ambulatory Visit: Attending: Cardiovascular Disease | Admitting: *Deleted

## 2023-10-18 DIAGNOSIS — I480 Paroxysmal atrial fibrillation: Secondary | ICD-10-CM | POA: Insufficient documentation

## 2023-10-18 DIAGNOSIS — Z5181 Encounter for therapeutic drug level monitoring: Secondary | ICD-10-CM | POA: Diagnosis not present

## 2023-10-18 LAB — POCT INR: INR: 3.6 — AB (ref 2.0–3.0)

## 2023-10-18 NOTE — Patient Instructions (Signed)
 Hold warfarin tonight then resume 1 tablet daily except 1 1/2 tablets on Mondays, Wednesdays and Fridays. Recheck 3 wks

## 2023-10-21 ENCOUNTER — Encounter: Payer: Self-pay | Admitting: Cardiovascular Disease

## 2023-10-31 ENCOUNTER — Ambulatory Visit

## 2023-11-04 NOTE — Progress Notes (Signed)
 Remote ICD transmission.

## 2023-11-08 ENCOUNTER — Ambulatory Visit: Attending: Cardiovascular Disease | Admitting: *Deleted

## 2023-11-08 DIAGNOSIS — I480 Paroxysmal atrial fibrillation: Secondary | ICD-10-CM | POA: Insufficient documentation

## 2023-11-08 DIAGNOSIS — Z5181 Encounter for therapeutic drug level monitoring: Secondary | ICD-10-CM | POA: Insufficient documentation

## 2023-11-08 LAB — POCT INR: INR: 3.2 — AB (ref 2.0–3.0)

## 2023-11-08 NOTE — Patient Instructions (Signed)
 Hold warfarin tonight then decrease dose to 1 tablet daily except 1 1/2 tablets on Wednesdays . Recheck 3 wks

## 2023-11-08 NOTE — Progress Notes (Signed)
Please see anticoagulation encounter.

## 2023-11-10 ENCOUNTER — Ambulatory Visit (INDEPENDENT_AMBULATORY_CARE_PROVIDER_SITE_OTHER)

## 2023-11-10 DIAGNOSIS — E538 Deficiency of other specified B group vitamins: Secondary | ICD-10-CM

## 2023-11-10 NOTE — Progress Notes (Signed)
 Patient is in office today for a nurse visit for B12 Injection. Patient Injection was given in the  Left deltoid. Patient tolerated injection well.

## 2023-11-29 ENCOUNTER — Ambulatory Visit: Attending: Cardiovascular Disease | Admitting: *Deleted

## 2023-11-29 DIAGNOSIS — Z5181 Encounter for therapeutic drug level monitoring: Secondary | ICD-10-CM | POA: Insufficient documentation

## 2023-11-29 DIAGNOSIS — I480 Paroxysmal atrial fibrillation: Secondary | ICD-10-CM | POA: Insufficient documentation

## 2023-11-29 LAB — POCT INR: INR: 2 (ref 2.0–3.0)

## 2023-11-29 NOTE — Patient Instructions (Signed)
 Continue warfarin 1 tablet daily except 1 1/2 tablets on Wednesdays . Recheck 4 wks

## 2023-11-29 NOTE — Progress Notes (Signed)
Please see anticoagulation encounter.

## 2023-12-12 ENCOUNTER — Ambulatory Visit (INDEPENDENT_AMBULATORY_CARE_PROVIDER_SITE_OTHER): Admitting: *Deleted

## 2023-12-12 DIAGNOSIS — E538 Deficiency of other specified B group vitamins: Secondary | ICD-10-CM | POA: Diagnosis not present

## 2023-12-12 NOTE — Progress Notes (Signed)
 Patient is in office today for a nurse visit for B12 Injection. Patient Injection was given in the  Right deltoid. Patient tolerated injection well.

## 2023-12-19 ENCOUNTER — Ambulatory Visit

## 2023-12-20 ENCOUNTER — Ambulatory Visit (INDEPENDENT_AMBULATORY_CARE_PROVIDER_SITE_OTHER): Payer: Medicaid Other

## 2023-12-20 DIAGNOSIS — I428 Other cardiomyopathies: Secondary | ICD-10-CM

## 2023-12-21 ENCOUNTER — Ambulatory Visit

## 2023-12-22 LAB — CUP PACEART REMOTE DEVICE CHECK
Battery Remaining Longevity: 67 mo
Battery Remaining Percentage: 64 %
Battery Voltage: 2.98 V
Brady Statistic AP VP Percent: 1 %
Brady Statistic AP VS Percent: 9.7 %
Brady Statistic AS VP Percent: 1 %
Brady Statistic AS VS Percent: 90 %
Brady Statistic RA Percent Paced: 8.9 %
Brady Statistic RV Percent Paced: 1 %
Date Time Interrogation Session: 20250813222707
HighPow Impedance: 40 Ohm
Implantable Lead Connection Status: 753985
Implantable Lead Connection Status: 753985
Implantable Lead Implant Date: 20111010
Implantable Lead Implant Date: 20111010
Implantable Lead Location: 753859
Implantable Lead Location: 753860
Implantable Pulse Generator Implant Date: 20211115
Lead Channel Impedance Value: 400 Ohm
Lead Channel Impedance Value: 440 Ohm
Lead Channel Pacing Threshold Amplitude: 0.75 V
Lead Channel Pacing Threshold Amplitude: 1 V
Lead Channel Pacing Threshold Pulse Width: 0.5 ms
Lead Channel Pacing Threshold Pulse Width: 1 ms
Lead Channel Sensing Intrinsic Amplitude: 11.7 mV
Lead Channel Sensing Intrinsic Amplitude: 2.9 mV
Lead Channel Setting Pacing Amplitude: 2.5 V
Lead Channel Setting Pacing Amplitude: 2.5 V
Lead Channel Setting Pacing Pulse Width: 0.5 ms
Lead Channel Setting Sensing Sensitivity: 0.5 mV
Pulse Gen Serial Number: 111031056
Zone Setting Status: 755011

## 2023-12-27 ENCOUNTER — Ambulatory Visit: Attending: Cardiovascular Disease | Admitting: *Deleted

## 2023-12-27 ENCOUNTER — Other Ambulatory Visit: Payer: Self-pay | Admitting: Cardiovascular Disease

## 2023-12-27 DIAGNOSIS — Z5181 Encounter for therapeutic drug level monitoring: Secondary | ICD-10-CM | POA: Diagnosis not present

## 2023-12-27 DIAGNOSIS — I480 Paroxysmal atrial fibrillation: Secondary | ICD-10-CM | POA: Insufficient documentation

## 2023-12-27 DIAGNOSIS — I4719 Other supraventricular tachycardia: Secondary | ICD-10-CM

## 2023-12-27 LAB — POCT INR: INR: 2.1 (ref 2.0–3.0)

## 2023-12-27 NOTE — Patient Instructions (Signed)
 Continue warfarin 1 tablet daily except 1 1/2 tablets on Wednesdays . Recheck 4 wks

## 2023-12-27 NOTE — Progress Notes (Signed)
 INR 2.1; Please see anticoagulation encounter

## 2023-12-28 ENCOUNTER — Ambulatory Visit (INDEPENDENT_AMBULATORY_CARE_PROVIDER_SITE_OTHER): Admitting: *Deleted

## 2023-12-28 ENCOUNTER — Other Ambulatory Visit

## 2023-12-28 DIAGNOSIS — Z9884 Bariatric surgery status: Secondary | ICD-10-CM | POA: Diagnosis not present

## 2023-12-28 DIAGNOSIS — E538 Deficiency of other specified B group vitamins: Secondary | ICD-10-CM

## 2023-12-28 DIAGNOSIS — E782 Mixed hyperlipidemia: Secondary | ICD-10-CM | POA: Diagnosis not present

## 2023-12-28 DIAGNOSIS — K912 Postsurgical malabsorption, not elsewhere classified: Secondary | ICD-10-CM | POA: Diagnosis not present

## 2023-12-28 MED ORDER — CYANOCOBALAMIN 1000 MCG/ML IJ SOLN
1000.0000 ug | Freq: Once | INTRAMUSCULAR | Status: AC
  Administered 2023-12-28: 1000 ug via INTRAMUSCULAR

## 2023-12-28 NOTE — Progress Notes (Signed)
 Patient is in office today for a nurse visit for B12 Injection. Patient Injection was given in the  Left deltoid. Patient tolerated injection well.

## 2023-12-30 ENCOUNTER — Ambulatory Visit: Payer: Self-pay | Admitting: Family Medicine

## 2023-12-30 ENCOUNTER — Encounter: Payer: Self-pay | Admitting: Family Medicine

## 2023-12-30 ENCOUNTER — Ambulatory Visit (INDEPENDENT_AMBULATORY_CARE_PROVIDER_SITE_OTHER): Admitting: Family Medicine

## 2023-12-30 VITALS — BP 95/64 | HR 53 | Ht 77.0 in | Wt 259.0 lb

## 2023-12-30 DIAGNOSIS — D72819 Decreased white blood cell count, unspecified: Secondary | ICD-10-CM | POA: Diagnosis not present

## 2023-12-30 DIAGNOSIS — R350 Frequency of micturition: Secondary | ICD-10-CM | POA: Diagnosis not present

## 2023-12-30 LAB — MICROSCOPIC EXAMINATION
Renal Epithel, UA: NONE SEEN /HPF
Yeast, UA: NONE SEEN

## 2023-12-30 LAB — URINALYSIS, COMPLETE
Bilirubin, UA: NEGATIVE
Glucose, UA: NEGATIVE
Ketones, UA: NEGATIVE
Leukocytes,UA: NEGATIVE
Nitrite, UA: NEGATIVE
Specific Gravity, UA: 1.025 (ref 1.005–1.030)
Urobilinogen, Ur: 2 mg/dL — ABNORMAL HIGH (ref 0.2–1.0)
pH, UA: 6 (ref 5.0–7.5)

## 2023-12-30 NOTE — Progress Notes (Signed)
 BP 95/64   Pulse (!) 53   Ht 6' 5 (1.956 m)   Wt 259 lb (117.5 kg)   SpO2 97%   BMI 30.71 kg/m    Subjective:   Patient ID: Danny Hinton, male    DOB: March 29, 1981, 43 y.o.   MRN: [id number]  HPI: Danny Hinton is a 43 y.o. male presenting on 12/30/2023 for Neutropenia   Discussed the use of AI scribe software for clinical note transcription with the patient, who gave verbal consent to proceed.  History of Present Illness   Danny Hinton is a 43 year old male who presents for a recheck of low white blood cell count.  He is here for a follow-up regarding his low white blood cell count, which was initially noted in April and has decreased further as of the most recent test. His labs were done at [facility], and the results were brought over for review. The white count was reported to be 2.7, which is lower than the previous count in April. He has a history of anemia, but he is unsure if it was checked in the recent tests.  He experiences occasional stinging during urination in the morning, which he suspects might be a mild urinary tract infection. No fever, chills, or sore throat. He has been using Correctol for bowel issues, which he has been doing for some time.  He experiences sinus congestion, particularly in the spring and fall, with symptoms including congestion and loss of smell. No fever or chills. He uses Flonase  for his sinus issues, although he feels it is not very effective.  He has noticed a weight gain of about 20 pounds over the summer, which he attributes to decreased physical activity and fluid retention. He mentions fluctuations in his weight, with a recent increase of five pounds, which he believes is due to fluid retention. He has been taking fluid pills regularly.          Relevant past medical, surgical, family and social history reviewed and updated as indicated. Interim medical history since our last visit reviewed. Allergies and medications reviewed and  updated.  Review of Systems  Constitutional:  Negative for chills and fever.  HENT:  Positive for congestion, rhinorrhea and sneezing. Negative for facial swelling, sinus pressure, sinus pain and sore throat.   Eyes:  Negative for discharge.  Respiratory:  Negative for cough, shortness of breath and wheezing.   Cardiovascular:  Negative for chest pain and leg swelling.  Musculoskeletal:  Negative for back pain and gait problem.  Skin:  Negative for rash.  All other systems reviewed and are negative.   Per HPI unless specifically indicated above   Allergies as of 12/30/2023   No Known Allergies      Medication List        Accurate as of December 30, 2023  8:30 AM. If you have any questions, ask your nurse or doctor.          atorvastatin  40 MG tablet Commonly known as: LIPITOR Take 1 tablet (40 mg total) by mouth daily.   benzonatate  100 MG capsule Commonly known as: Tessalon  Perles Take 1 capsule (100 mg total) by mouth 3 (three) times daily as needed.   carvedilol  25 MG tablet Commonly known as: COREG  TAKE 1/2 (ONE-HALF) TABLET BY MOUTH TWICE DAILY WITH A MEAL   Chlorphen-PE-Acetaminophen  4-10-325 MG Tabs Take 1 tablet by mouth every 6 (six) hours as needed.   Entresto  49-51 MG Generic drug: sacubitril -valsartan   Take 1 tablet by mouth 2 (two) times daily. KEEP OV.   EQ All Day Allergy Relief 10 MG tablet Generic drug: loratadine  Take 1 tablet by mouth in the evening   FLUoxetine  20 MG tablet Commonly known as: PROZAC  Take 20 mg by mouth daily.   fluticasone  50 MCG/ACT nasal spray Commonly known as: FLONASE  Place 1 spray into both nostrils as needed.   furosemide  40 MG tablet Commonly known as: LASIX  TAKE 1 TABLET BY MOUTH ONCE DAILY AS NEEDED. KEEP OFFICE VISIT   metoprolol  tartrate 25 MG tablet Commonly known as: LOPRESSOR  Take 1 tablet as needed for palpitations   multivitamin with minerals Tabs tablet Take 1 tablet by mouth daily.   Vitamin D   (Ergocalciferol ) 1.25 MG (50000 UNIT) Caps capsule Commonly known as: DRISDOL  Take 1 capsule (50,000 Units total) by mouth every 7 (seven) days. What changed:  when to take this additional instructions   VSL#3 Caps Take twice a day   warfarin 5 MG tablet Commonly known as: COUMADIN  Take as directed by the anticoagulation clinic. If you are unsure how to take this medication, talk to your nurse or doctor. Original instructions: TAKE 1 TO 1 & 1/2 (ONE & ONE-HALF) TABLETS BY MOUTH ONCE DAILY OR AS DIRECTED BY COUMADIN  CLINIC         Objective:   BP 95/64   Pulse (!) 53   Ht 6' 5 (1.956 m)   Wt 259 lb (117.5 kg)   SpO2 97%   BMI 30.71 kg/m   Wt Readings from Last 3 Encounters:  12/30/23 259 lb (117.5 kg)  09/29/23 251 lb (113.9 kg)  06/30/23 246 lb (111.6 kg)    Physical Exam Physical Exam   VITALS: BP- 95/64 HEENT: Throat without signs of infection. CHEST: Lungs clear to auscultation bilaterally. CARDIOVASCULAR: Heart regular rate and rhythm.     Abdomen no pain upon palpation no distention, soft and nontender    Assessment & Plan:   Problem List Items Addressed This Visit   None Visit Diagnoses       Leukopenia, unspecified type    -  Primary   Relevant Orders   Ambulatory referral to Hematology / Oncology   Urinalysis, Complete   Urine Culture     Urinary frequency       Relevant Orders   Urinalysis, Complete   Urine Culture         Leukopenia Leukopenia with further decrease in WBC count since April. Possible causes include heart medications, infection, or other conditions. No clear infection signs, but urinary symptoms suggest possible UTI. Previous anemia not confirmed recently. - Order urine analysis for infection. - Refer to hematology for leukopenia evaluation. - Review external lab results. - Advise to report infection signs or worsening symptoms. - Recheck through [facility] on 12/28/2023 shows WBC of 2.7 which is lower than the 2.9 we  had in April, leans towards neutropenia  Chronic sinus congestion Chronic sinus congestion with nasal congestion and anosmia every spring and fall. No fever, chills, or sinus pain. Limited relief from Flonase . - Discuss nasal saline sprays or lavage.          Follow up plan: Return if symptoms worsen or fail to improve.  Counseling provided for all of the vaccine components Orders Placed This Encounter  Procedures   Urine Culture   Urinalysis, Complete   Ambulatory referral to Hematology / Oncology    Fonda Levins, MD [facility] 12/30/2023, 8:30 AM

## 2023-12-31 ENCOUNTER — Encounter: Payer: Self-pay | Admitting: Family Medicine

## 2024-01-01 ENCOUNTER — Ambulatory Visit: Payer: Self-pay | Admitting: Cardiovascular Disease

## 2024-01-01 LAB — URINE CULTURE

## 2024-01-02 ENCOUNTER — Ambulatory Visit: Admitting: Family Medicine

## 2024-01-26 ENCOUNTER — Emergency Department (HOSPITAL_COMMUNITY)

## 2024-01-26 ENCOUNTER — Encounter (HOSPITAL_COMMUNITY): Payer: Self-pay | Admitting: Emergency Medicine

## 2024-01-26 ENCOUNTER — Other Ambulatory Visit: Payer: Self-pay | Admitting: Cardiovascular Disease

## 2024-01-26 ENCOUNTER — Other Ambulatory Visit: Payer: Self-pay

## 2024-01-26 ENCOUNTER — Emergency Department (HOSPITAL_COMMUNITY)
Admission: EM | Admit: 2024-01-26 | Discharge: 2024-01-26 | Disposition: A | Discharge status: Home / Self Care | Attending: Emergency Medicine | Admitting: Emergency Medicine

## 2024-01-26 DIAGNOSIS — M545 Low back pain, unspecified: Secondary | ICD-10-CM | POA: Diagnosis not present

## 2024-01-26 DIAGNOSIS — Z7901 Long term (current) use of anticoagulants: Secondary | ICD-10-CM | POA: Insufficient documentation

## 2024-01-26 DIAGNOSIS — R109 Unspecified abdominal pain: Secondary | ICD-10-CM | POA: Diagnosis not present

## 2024-01-26 DIAGNOSIS — R319 Hematuria, unspecified: Secondary | ICD-10-CM | POA: Insufficient documentation

## 2024-01-26 DIAGNOSIS — K838 Other specified diseases of biliary tract: Secondary | ICD-10-CM | POA: Diagnosis not present

## 2024-01-26 DIAGNOSIS — R1031 Right lower quadrant pain: Secondary | ICD-10-CM | POA: Diagnosis not present

## 2024-01-26 LAB — CBC WITH DIFFERENTIAL/PLATELET
Abs Immature Granulocytes: 0.01 K/uL (ref 0.00–0.07)
Basophils Absolute: 0 K/uL (ref 0.0–0.1)
Basophils Relative: 1 %
Eosinophils Absolute: 0.3 K/uL (ref 0.0–0.5)
Eosinophils Relative: 5 %
HCT: 42.8 % (ref 39.0–52.0)
Hemoglobin: 13.4 g/dL (ref 13.0–17.0)
Immature Granulocytes: 0 %
Lymphocytes Relative: 31 %
Lymphs Abs: 1.7 K/uL (ref 0.7–4.0)
MCH: 28 pg (ref 26.0–34.0)
MCHC: 31.3 g/dL (ref 30.0–36.0)
MCV: 89.5 fL (ref 80.0–100.0)
Monocytes Absolute: 0.5 K/uL (ref 0.1–1.0)
Monocytes Relative: 10 %
Neutro Abs: 2.9 K/uL (ref 1.7–7.7)
Neutrophils Relative %: 53 %
Platelets: 255 K/uL (ref 150–400)
RBC: 4.78 MIL/uL (ref 4.22–5.81)
RDW: 13 % (ref 11.5–15.5)
WBC: 5.4 K/uL (ref 4.0–10.5)
nRBC: 0 % (ref 0.0–0.2)

## 2024-01-26 LAB — COMPREHENSIVE METABOLIC PANEL WITH GFR
ALT: 19 U/L (ref 0–44)
AST: 19 U/L (ref 15–41)
Albumin: 4 g/dL (ref 3.5–5.0)
Alkaline Phosphatase: 54 U/L (ref 38–126)
Anion gap: 10 (ref 5–15)
BUN: 11 mg/dL (ref 6–20)
CO2: 24 mmol/L (ref 22–32)
Calcium: 8.2 mg/dL — ABNORMAL LOW (ref 8.9–10.3)
Chloride: 105 mmol/L (ref 98–111)
Creatinine, Ser: 0.83 mg/dL (ref 0.61–1.24)
GFR, Estimated: >60 mL/min (ref >60)
Glucose, Bld: 85 mg/dL (ref 70–99)
Potassium: 3.5 mmol/L (ref 3.5–5.1)
Sodium: 139 mmol/L (ref 135–145)
Total Bilirubin: 1.5 mg/dL — ABNORMAL HIGH (ref 0.0–1.2)
Total Protein: 7.3 g/dL (ref 6.5–8.1)

## 2024-01-26 LAB — URINALYSIS, ROUTINE W REFLEX MICROSCOPIC
Bacteria, UA: NONE SEEN
Bilirubin Urine: NEGATIVE
Glucose, UA: NEGATIVE mg/dL
Ketones, ur: 5 mg/dL — AB
Leukocytes,Ua: NEGATIVE
Nitrite: NEGATIVE
Protein, ur: 100 mg/dL — AB
RBC / HPF: >50 RBC/hpf (ref 0–5)
Specific Gravity, Urine: 1.027 (ref 1.005–1.030)
pH: 6 (ref 5.0–8.0)

## 2024-01-26 MED ORDER — ONDANSETRON HCL 4 MG/2ML IJ SOLN
4.0000 mg | Freq: Once | INTRAMUSCULAR | Status: AC
  Administered 2024-01-26: 4 mg via INTRAVENOUS
  Filled 2024-01-26: qty 2

## 2024-01-26 MED ORDER — HYDROMORPHONE HCL 1 MG/ML IJ SOLN
1.0000 mg | Freq: Once | INTRAMUSCULAR | Status: AC
  Administered 2024-01-26: 1 mg via INTRAVENOUS
  Filled 2024-01-26: qty 1

## 2024-01-26 MED ORDER — OXYCODONE-ACETAMINOPHEN 5-325 MG PO TABS: 1.0000 | ORAL_TABLET | Freq: Four times a day (QID) | ORAL | 0 refills | Status: AC | PRN

## 2024-01-26 MED ORDER — SODIUM CHLORIDE 0.9 % IV BOLUS
1000.0000 mL | Freq: Once | INTRAVENOUS | Status: AC
  Administered 2024-01-26: 1000 mL via INTRAVENOUS

## 2024-01-26 MED ORDER — FENTANYL CITRATE (PF) 100 MCG/2ML IJ SOLN
100.0000 ug | Freq: Once | INTRAMUSCULAR | Status: AC
  Administered 2024-01-26: 100 ug via INTRAVENOUS
  Filled 2024-01-26: qty 2

## 2024-01-26 MED ORDER — ONDANSETRON HCL 4 MG PO TABS: 4.0000 mg | ORAL_TABLET | Freq: Four times a day (QID) | ORAL | 0 refills | Status: AC

## 2024-01-26 MED ORDER — TAMSULOSIN HCL 0.4 MG PO CAPS
0.4000 mg | ORAL_CAPSULE | Freq: Every day | ORAL | 0 refills | Status: AC
Start: 2024-01-26 — End: 2024-02-05

## 2024-01-26 NOTE — ED Triage Notes (Signed)
 Pt bib pov w/ c/o lower right back pain radiating in to the side. Pt states he had a normal day yesterday and denies any injury. Pt woke up in bed with pain around 0430-5000. Pt reports pain as burning at 10/10.

## 2024-01-26 NOTE — Discharge Instructions (Signed)
 Your testing today was reassuring, the CT scan did not show any specific abnormalities but because you had some blood in your urine I suspect that you have had a kidney stone.  That being said the kidney stone is small enough that it is not being picked up on a CT scan.  You will likely pass this without any significant issues other than some pain and nausea.  Because of that I have given you some medications to go home with  Zofran  is a medication which can help with nausea.  You may take 4 mg by mouth every 6 hours as needed if you are an adult, if your child under the age of 6 take half of a tablet or 2 mg every 6 hours as needed.  This should dissolve on your tongue within a short timeframe.  Wait about 30 minutes after taking it to help with drinking clear liquids.  Opiate medications such as Percocet or Vicodin or morphine are very strong pain medications that are also very addictive if they are taken for too long, even a single dose can predispose someone to become addicted so be very careful when taking this medication.  You should take the smallest amount possible to relieve your pain, these medications may cause constipation or nausea, please follow-up with your doctor if you are having the need for ongoing pain control despite these medications.  Additionally please be aware that these medications may cause sedation or sleepiness or alter judgment so you should not take this if you are driving a vehicle or operating heavy machinery or taking care of small children.  Thank you for allowing us  to treat you in the emergency department today.  After reviewing your examination and potential testing that was done it appears that you are safe to go home.  I would like for you to follow-up with your doctor within the next several days, have them obtain your records and follow-up with them to review all potential tests and results from your visit.  If you should develop severe or worsening symptoms return to  the emergency department immediately  Your family doctor does need to recheck your urine within 2 weeks to make sure there is no more blood

## 2024-01-26 NOTE — ED Provider Notes (Signed)
 [place] EMERGENCY DEPARTMENT AT [facility] Provider Note   CSN: [id number] Arrival date & time: 01/26/24  9298     Patient presents with: Flank Pain   Danny Hinton is a 43 y.o. male.    Back Pain    This patient is a 43 year old male who underwent gastric bypass surgery a couple of years ago, he presents with a complaint of right lower back pain that woke him up this morning.  The pain radiates to his right lower quadrant and feels like sometimes it goes down his leg.  He has no numbness or weakness, no dysuria or hematuria, no history of kidney stones that he is aware of.  He is nauseated and the pain is quite intense.  It does not seem to be positional, he has never had any other abdominal surgery and still has a gallbladder and appendix.  Prior to Admission medications   Medication Sig Start Date End Date Taking? Authorizing Provider  ondansetron  (ZOFRAN ) 4 MG tablet Take 1 tablet (4 mg total) by mouth every 6 (six) hours. 01/26/24  Yes Cleotilde Rogue, MD  oxyCODONE -acetaminophen  (PERCOCET/ROXICET) 5-325 MG tablet Take 1 tablet by mouth every 6 (six) hours as needed for severe pain (pain score 7-10). 01/26/24  Yes Cleotilde Rogue, MD  tamsulosin  (FLOMAX ) 0.4 MG CAPS capsule Take 1 capsule (0.4 mg total) by mouth daily for 10 days. 01/26/24 02/05/24 Yes Cleotilde Rogue, MD  atorvastatin  (LIPITOR) 40 MG tablet Take 1 tablet (40 mg total) by mouth daily. 07/27/21   Croitoru, Mihai, MD  benzonatate  (TESSALON  PERLES) 100 MG capsule Take 1 capsule (100 mg total) by mouth 3 (three) times daily as needed. 06/30/23   Joesph Annabella HERO, FNP  carvedilol  (COREG ) 25 MG tablet TAKE 1/2 (ONE-HALF) TABLET BY MOUTH TWICE DAILY WITH A MEAL 12/30/23   Croitoru, Mihai, MD  Chlorphen-PE-Acetaminophen  4-10-325 MG TABS Take 1 tablet by mouth every 6 (six) hours as needed. 09/09/23   Gladis Mary-Margaret, FNP  FLUoxetine  (PROZAC ) 20 MG tablet Take 20 mg by mouth daily. 09/11/19   [provider]   fluticasone  (FLONASE ) 50 MCG/ACT nasal spray Place 1 spray into both nostrils as needed. 06/30/23   Joesph Annabella HERO, FNP  furosemide  (LASIX ) 40 MG tablet TAKE 1 TABLET BY MOUTH ONCE DAILY AS NEEDED. KEEP OFFICE VISIT 12/30/23   Croitoru, Jerel, MD  loratadine  (EQ ALL DAY ALLERGY RELIEF) 10 MG tablet Take 1 tablet by mouth in the evening 08/11/23   Dettinger, Fonda LABOR, MD  metoprolol  tartrate (LOPRESSOR ) 25 MG tablet Take 1 tablet as needed for palpitations 07/27/21   Croitoru, Mihai, MD  Multiple Vitamin (MULTIVITAMIN WITH MINERALS) TABS tablet Take 1 tablet by mouth daily.    [provider]  Probiotic Product (VSL#3) CAPS Take twice a day 06/18/19   Armbruster, Elspeth SQUIBB, MD  sacubitril -valsartan  (ENTRESTO ) 49-51 MG Take 1 tablet by mouth 2 (two) times daily. KEEP OV. 04/25/23   Croitoru, Mihai, MD  Vitamin D , Ergocalciferol , (DRISDOL ) 1.25 MG (50000 UNIT) CAPS capsule Take 1 capsule (50,000 Units total) by mouth every 7 (seven) days. Patient taking differently: Take 50,000 Units by mouth 2 (two) times a week. Tues and Thurs 10/23/19   Prentiss Frieze, DO  warfarin (COUMADIN ) 5 MG tablet TAKE 1 TO 1 & 1/2 (ONE & ONE-HALF) TABLETS BY MOUTH ONCE DAILY OR AS DIRECTED BY COUMADIN  CLINIC 07/27/23   Croitoru, Jerel, MD    Allergies: Patient has no known allergies.    Review of Systems  Musculoskeletal:  Positive for back pain.  All other systems reviewed and are negative.   Updated Vital Signs BP 109/61   Pulse (!) 55   Temp (!) 97.5 F (36.4 C) (Oral)   Resp 18   Ht 1.956 m (6' 5)   SpO2 98%   BMI 30.71 kg/m   Physical Exam Vitals and nursing note reviewed.  Constitutional:      General: He is not in acute distress.    Appearance: He is well-developed.  HENT:     Head: Normocephalic and atraumatic.     Mouth/Throat:     Pharynx: No oropharyngeal exudate.  Eyes:     General: No scleral icterus.       Right eye: No discharge.        Left eye: No discharge.      Conjunctiva/sclera: Conjunctivae normal.     Pupils: Pupils are equal, round, and reactive to light.  Neck:     Thyroid : No thyromegaly.     Vascular: No JVD.  Cardiovascular:     Rate and Rhythm: Normal rate and regular rhythm.     Heart sounds: Normal heart sounds. No murmur heard.    No friction rub. No gallop.  Pulmonary:     Effort: Pulmonary effort is normal. No respiratory distress.     Breath sounds: Normal breath sounds. No wheezing or rales.  Abdominal:     General: Bowel sounds are normal. There is no distension.     Palpations: Abdomen is soft. There is no mass.     Tenderness: There is no abdominal tenderness.     Comments: There is no abdominal tenderness to palpation, there is no CVA tenderness  Musculoskeletal:        General: No tenderness. Normal range of motion.     Cervical back: Normal range of motion and neck supple.     Right lower leg: No edema.     Left lower leg: No edema.  Lymphadenopathy:     Cervical: No cervical adenopathy.  Skin:    General: Skin is warm and dry.     Findings: No erythema or rash.  Neurological:     General: No focal deficit present.     Mental Status: He is alert.     Coordination: Coordination normal.  Psychiatric:        Behavior: Behavior normal.     (all labs ordered are listed, but only abnormal results are displayed) Labs Reviewed  COMPREHENSIVE METABOLIC PANEL WITH GFR - Abnormal; Notable for the following components:      Result Value   Calcium  8.2 (*)    Total Bilirubin 1.5 (*)    All other components within normal limits  URINALYSIS, ROUTINE W REFLEX MICROSCOPIC - Abnormal; Notable for the following components:   Color, Urine AMBER (*)    APPearance HAZY (*)    Hgb urine dipstick LARGE (*)    Ketones, ur 5 (*)    Protein, ur 100 (*)    All other components within normal limits  URINE CULTURE  CBC WITH DIFFERENTIAL/PLATELET    EKG: None  Radiology: CT Renal Stone Study Result Date: 01/26/2024 CLINICAL  DATA:  Abdominal pain EXAM: CT ABDOMEN AND PELVIS WITHOUT CONTRAST TECHNIQUE: Multidetector CT imaging of the abdomen and pelvis was performed following the standard protocol without IV contrast. RADIATION DOSE REDUCTION: This exam was performed according to the departmental dose-optimization program which includes automated exposure control, adjustment of the mA and/or kV according to patient  size and/or use of iterative reconstruction technique. COMPARISON:  None Available. FINDINGS: Lower chest: Lung bases are clear. Hepatobiliary: No focal hepatic lesion. Small amount sludge within the gallbladder. No biliary duct dilatation. Common bile duct is normal. Pancreas: Pancreas is normal. No ductal dilatation. No pancreatic inflammation. Spleen: Normal spleen Adrenals/urinary tract: Adrenal glands normal. No nephrolithiasis or ureterolithiasis. No obstructive uropathy. No bladder calculi. Several vascular calcifications pelvis. Stomach/Bowel: Post Roux-en-Y gastric bypass surgery no evidence of small-bowel obstruction. Appendix is normal. The colon and rectosigmoid colon are normal. Vascular/Lymphatic: Abdominal aorta is normal caliber. No periportal or retroperitoneal adenopathy. No pelvic adenopathy. Reproductive: Prostate unremarkable Other: No free fluid. Musculoskeletal: No aggressive osseous lesion. IMPRESSION: 1. No nephrolithiasis or ureterolithiasis. No obstructive uropathy. 2. Normal appendix. 3. Post Roux-en-Y gastric bypass surgery without complication. 4. Small amount of sludge within the gallbladder. Electronically Signed   By: Jackquline Boxer M.D.   On: 01/26/2024 09:41     Procedures   Medications Ordered in the ED  HYDROmorphone  (DILAUDID ) injection 1 mg (1 mg Intravenous Given 01/26/24 0755)  ondansetron  (ZOFRAN ) injection 4 mg (4 mg Intravenous Given 01/26/24 0755)  sodium chloride  0.9 % bolus 1,000 mL (0 mLs Intravenous Stopped 01/26/24 1039)  fentaNYL  (SUBLIMAZE ) injection 100 mcg (100 mcg  Intravenous Given 01/26/24 0951)                                    Medical Decision Making Amount and/or Complexity of Data Reviewed Labs: ordered. Radiology: ordered.  Risk Prescription drug management.   This patient appears uncomfortable, his vital signs are unremarkable, he has history of gastric bypass raises concern for either gastrointestinal pathology but more likely to be kidney stone type pain.  Will obtain a CT renal stone study initially to look for possible stone although this patient does have other potential complications and if the CT is negative will need to further pursue with a contrasted CT or other modality.  The patient is agreeable to a dose of hydromorphone  and Zofran , he does not appear septic  Labs:  I  personally viewed and interpreted the labs which show hematuria is present, no bacteria, no leukocytosis or anemia, metabolic panel is reassuring with normal renal function   Radiology Imaging: I personally viewed the images of the ordered radiographic studies and find no acute findings, specifically no complications of the prior gastric bypass, no kidney stones, no free air I agree with the radiologist interpretation as well  Meds / Interventions: while in the ED the patient received the following: Intravenous opiates The response to the interventions was that the patient improved  Suspect possible passed stone but no other surgical findings, vital signs reassuring, patient agreeable to return should symptoms worsen, he understands his results and the indications for return and is agreeable to the plan.     Final diagnoses:  Flank pain  Hematuria, unspecified type    ED Discharge Orders          Ordered    oxyCODONE -acetaminophen  (PERCOCET/ROXICET) 5-325 MG tablet  Every 6 hours PRN        01/26/24 1132    ondansetron  (ZOFRAN ) 4 MG tablet  Every 6 hours        01/26/24 1132    tamsulosin  (FLOMAX ) 0.4 MG CAPS capsule  Daily        01/26/24 1132  Cleotilde Rogue, MD 01/26/24 [phone]

## 2024-01-26 NOTE — ED Notes (Signed)
Pt aware urine sample is needed. Pt unable to provide at this time

## 2024-01-27 LAB — URINE CULTURE: Culture: NO GROWTH

## 2024-01-31 ENCOUNTER — Ambulatory Visit: Attending: Cardiovascular Disease | Admitting: *Deleted

## 2024-01-31 DIAGNOSIS — I480 Paroxysmal atrial fibrillation: Secondary | ICD-10-CM | POA: Diagnosis not present

## 2024-01-31 DIAGNOSIS — Z5181 Encounter for therapeutic drug level monitoring: Secondary | ICD-10-CM | POA: Insufficient documentation

## 2024-01-31 LAB — POCT INR: INR: 1.8 — AB (ref 2.0–3.0)

## 2024-01-31 NOTE — Progress Notes (Signed)
 INR 1.8. Please see anticoagulation encounter

## 2024-01-31 NOTE — Patient Instructions (Signed)
 Take warfarin 2 tablets tonight then resume 1 tablet daily except 1 1/2 tablets on Wednesdays.  Recheck 3 wks

## 2024-02-02 ENCOUNTER — Other Ambulatory Visit: Payer: Self-pay | Admitting: Cardiovascular Disease

## 2024-02-02 DIAGNOSIS — I4719 Other supraventricular tachycardia: Secondary | ICD-10-CM

## 2024-02-02 NOTE — Progress Notes (Signed)
Remote ICD Transmission.

## 2024-02-06 ENCOUNTER — Ambulatory Visit (INDEPENDENT_AMBULATORY_CARE_PROVIDER_SITE_OTHER): Admitting: Nurse Practitioner

## 2024-02-06 ENCOUNTER — Encounter: Payer: Self-pay | Admitting: Nurse Practitioner

## 2024-02-06 VITALS — BP 94/65 | HR 65 | Temp 98.5°F | Ht 77.0 in | Wt 248.0 lb

## 2024-02-06 DIAGNOSIS — Z09 Encounter for follow-up examination after completed treatment for conditions other than malignant neoplasm: Secondary | ICD-10-CM | POA: Diagnosis not present

## 2024-02-06 DIAGNOSIS — J0100 Acute maxillary sinusitis, unspecified: Secondary | ICD-10-CM | POA: Diagnosis not present

## 2024-02-06 MED ORDER — AMOXICILLIN-POT CLAVULANATE 875-125 MG PO TABS: 1.0000 | ORAL_TABLET | Freq: Two times a day (BID) | ORAL | 0 refills | Status: AC

## 2024-02-06 NOTE — Progress Notes (Signed)
 Subjective:    Patient ID: Danny Hinton, male    DOB: Dec 23, 1980, 43 y.o.   MRN: [id number]  Chief Complaint: hospital follow up  HPI Patient in for: - hospital follow up- patient went to the ED on 01/26/24 with flank pain. Right lower  back pain radiating to right flank area. Dx wth flank pain and hematuria. It is suspected that he passed a kidney stone, her was given percocet and flomax  and was discharged home. Has had no more pain since leaving ED.  - sinus issues- started getting sick 4 days ago. Now has sinus pressure and congestion. Slight cough. No fever. Has use OTC sinus meds-no relief.    Patient Active Problem List   Diagnosis Date Noted   ICD (implantable cardioverter-defibrillator) battery depletion 03/24/2020   Vitamin D  deficiency, vitamin D  = 6.9 (03/28/19) 04/29/2019   Diabetes mellitus (HCC), Rx Victoza  04/29/2019   B12 nutritional deficiency, B12 = 214 (03/18/19) 04/29/2019   Dyslipidemia associated with type 2 diabetes mellitus (HCC), Rx Lipitor 04/29/2019   Depression, reactive, Rx Wellbutrin , Zoloft  04/29/2019   Aneurysm of ascending aorta without rupture 04/29/2019   OSA treated with BiPAP 09/04/2018   Metabolic syndrome 03/26/2015   Mixed hyperlipidemia 02/10/2015   Chronic combined systolic and diastolic heart failure (HCC)    Essential hypertension    Non-ischemic cardiomyopathy (HCC)    Automatic implantable cardioverter-defibrillator in situ    Paroxysmal atrial fibrillation (HCC) 07/18/2012   Long term current use of anticoagulant therapy, Coumadin  07/18/2012       Review of Systems  Constitutional:  Negative for chills and fever.  HENT:  Positive for congestion, sinus pressure and sinus pain. Negative for trouble swallowing and voice change.   Respiratory:  Positive for cough. Negative for shortness of breath.        Objective:   Physical Exam Constitutional:      Appearance: Normal appearance.  HENT:     Right Ear: Tympanic membrane  normal. There is no impacted cerumen.     Left Ear: Tympanic membrane normal. There is no impacted cerumen.     Nose: Congestion and rhinorrhea present.     Right Sinus: Maxillary sinus tenderness present.     Left Sinus: Maxillary sinus tenderness present.     Mouth/Throat:     Pharynx: No oropharyngeal exudate or posterior oropharyngeal erythema.  Cardiovascular:     Rate and Rhythm: Normal rate and regular rhythm.     Heart sounds: Normal heart sounds.  Abdominal:     Tenderness: There is right CVA tenderness and left CVA tenderness.  Skin:    General: Skin is warm and dry.  Neurological:     General: No focal deficit present.     Mental Status: He is alert and oriented to person, place, and time.  Psychiatric:        Mood and Affect: Mood normal.        Behavior: Behavior normal.     BP 94/65   Pulse 65   Temp 98.5 F (36.9 C) (Temporal)   Ht 6' 5 (1.956 m)   Wt 248 lb (112.5 kg)   SpO2 93%   BMI 29.41 kg/m        Assessment & Plan:   Danny Hinton in today with chief complaint of Hospitalization Follow-up (Sinus/)   1. Acute non-recurrent maxillary sinusitis (Primary) 1. Take meds as prescribed 2. Use a cool mist humidifier especially during the winter months and when heat has been  humid. 3. Use saline nose sprays frequently 4. Saline irrigations of the nose can be very helpful if done frequently.  * 4X daily for 1 week*  * Use of a nettie pot can be helpful with this. Follow directions with this* 5. Drink plenty of fluids 6. Keep thermostat turn down low 7.For any cough or congestion- mucinex  8. For fever or aces or pains- take tylenol  or ibuprofen appropriate for age and weight.  * for fevers greater than 101 orally you may alternate ibuprofen and tylenol  every  3 hours.    - amoxicillin -clavulanate (AUGMENTIN ) 875-125 MG tablet; Take 1 tablet by mouth 2 (two) times daily.  Dispense: 14 tablet; Refill: 0  2. Hospital discharge follow-up Hospital records  reviewed Resolved flank pain and hematuria Continue  to force fluids  RTO if symptoms reoccur    The above assessment and management plan was discussed with the patient. The patient verbalized understanding of and has agreed to the management plan. Patient is aware to call the clinic if symptoms persist or worsen. Patient is aware when to return to the clinic for a follow-up visit. Patient educated on when it is appropriate to go to the emergency department.   Mary-Margaret Gladis, FNP

## 2024-02-06 NOTE — Patient Instructions (Addendum)

## 2024-02-08 ENCOUNTER — Other Ambulatory Visit: Payer: Self-pay | Admitting: Family Medicine

## 2024-02-09 ENCOUNTER — Telehealth: Payer: Self-pay

## 2024-02-09 ENCOUNTER — Ambulatory Visit (INDEPENDENT_AMBULATORY_CARE_PROVIDER_SITE_OTHER)

## 2024-02-09 VITALS — BP 94/65 | HR 65 | Ht 77.0 in | Wt 248.0 lb

## 2024-02-09 DIAGNOSIS — Z0000 Encounter for general adult medical examination without abnormal findings: Secondary | ICD-10-CM

## 2024-02-09 NOTE — Progress Notes (Signed)
 Subjective:   Danny Hinton is a 43 y.o. who presents for a Medicare Wellness preventive visit.  As a reminder, Annual Wellness Visits don't include a physical exam, and some assessments may be limited, especially if this visit is performed virtually. We may recommend an in-person follow-up visit with your provider if needed.  Visit Complete: Virtual I connected with  Danny Hinton on 02/09/24 by a video and audio enabled telemedicine application and verified that I am speaking with the correct person using two identifiers.  Patient Location: Home  Provider Location: Home Office  I discussed the limitations of evaluation and management by telemedicine. The patient expressed understanding and agreed to proceed.  Vital Signs: Because this visit was a virtual/telehealth visit, some criteria may be missing or patient reported. Any vitals not documented were not able to be obtained and vitals that have been documented are patient reported.  VideoDeclined- This patient declined Librarian, academic. Therefore the visit was completed with audio only.  Persons Participating in Visit: Patient.  AWV Questionnaire: No: Patient Medicare AWV questionnaire was not completed prior to this visit.  Cardiac Risk Factors include: advanced age (>44men, >54 women);diabetes mellitus;dyslipidemia;hypertension;male gender     Objective:    Today's Vitals   02/09/24 0818  BP: 94/65  Pulse: 65  Weight: 248 lb (112.5 kg)  Height: 6' 5 (1.956 m)   Body mass index is 29.41 kg/m.     02/09/2024    8:06 AM 01/26/2024    7:22 AM 12/15/2022    8:06 AM 03/24/2020    2:11 PM 03/29/2018    9:01 PM 09/14/2017    8:48 AM 02/10/2015    9:31 AM  Advanced Directives  Does Patient Have a Medical Advance Directive? No No No No No  No  Yes   Type of Tax inspector;Living will   Does patient want to make changes to medical advance directive?       No -  Patient declined   Copy of Healthcare Power of Attorney in Chart?       No - copy requested   Would patient like information on creating a medical advance directive?  No - Patient declined Yes (MAU/Ambulatory/Procedural Areas - Information given) No - Patient declined No - Patient declined  Yes (MAU/Ambulatory/Procedural Areas - Information given)       Data saved with a previous flowsheet row definition    Current Medications (verified) Outpatient Encounter Medications as of 02/09/2024  Medication Sig   amoxicillin -clavulanate (AUGMENTIN ) 875-125 MG tablet Take 1 tablet by mouth 2 (two) times daily.   carvedilol  (COREG ) 25 MG tablet TAKE 1/2 (ONE-HALF) TABLET BY MOUTH TWICE DAILY WITH A MEAL   Chlorphen-PE-Acetaminophen  4-10-325 MG TABS Take 1 tablet by mouth every 6 (six) hours as needed.   FLUoxetine  (PROZAC ) 20 MG tablet Take 20 mg by mouth daily.   fluticasone  (FLONASE ) 50 MCG/ACT nasal spray Place 1 spray into both nostrils as needed.   furosemide  (LASIX ) 40 MG tablet TAKE 1 TABLET BY MOUTH ONCE DAILY AS NEEDED. KEEP OFFICE VISIT   loratadine  (CLARITIN ) 10 MG tablet Take 1 tablet by mouth in the evening   metoprolol  tartrate (LOPRESSOR ) 25 MG tablet Take 1 tablet as needed for palpitations   Multiple Vitamin (MULTIVITAMIN WITH MINERALS) TABS tablet Take 1 tablet by mouth daily.   ondansetron  (ZOFRAN ) 4 MG tablet Take 1 tablet (4 mg total) by mouth every 6 (  six) hours.   oxyCODONE -acetaminophen  (PERCOCET/ROXICET) 5-325 MG tablet Take 1 tablet by mouth every 6 (six) hours as needed for severe pain (pain score 7-10).   Probiotic Product (VSL#3) CAPS Take twice a day   sacubitril -valsartan  (ENTRESTO ) 49-51 MG Take 1 tablet by mouth 2 (two) times daily. KEEP OV.   Vitamin D , Ergocalciferol , (DRISDOL ) 1.25 MG (50000 UNIT) CAPS capsule Take 1 capsule (50,000 Units total) by mouth every 7 (seven) days. (Patient taking differently: Take 50,000 Units by mouth 2 (two) times a week. Tues and Thurs)    warfarin (COUMADIN ) 5 MG tablet TAKE 1 TO 1 & 1/2 (ONE & ONE-HALF) TABLETS BY MOUTH ONCE DAILY OR AS DIRECTED BY COUMADIN  CLINIC   No facility-administered encounter medications on file as of 02/09/2024.    Allergies (verified) Patient has no known allergies.   History: Past Medical History:  Diagnosis Date   Anxiety    Atypical atrial flutter (HCC) 08/19/2014   Pace terminated in device clinic.  AFL cycle length was 390 msec.   Automatic implantable cardioverter-defibrillator in situ    a. St Jude in 2011.   B12 nutritional deficiency, B12 = 214 (03/18/19) 04/29/2019   Chronic anticoagulation with Coumadin     Chronic systolic congestive heart failure (HCC)    a. suspected NICM - dx 2010. EF 15% by TEE, 10-20% by echo at that time. b. s/p St. Jude AICD 2011. c. Echo 01/2010: mod dilated LV, EF 30%, mod aortic root dilitation, no significant valvular disease.   Depression    Diabetes mellitus (HCC), Rx Victoza  04/29/2019   Dyslipidemia associated with type 2 diabetes mellitus (HCC), Rx Lipitor 04/29/2019   Eczema    Enlarged aorta    Hypertension associated with diabetes (HCC)    Mobitz type 2 second degree atrioventricular block    a. During sleeping hours in 2010 suspected due to OSA.   Morbid obesity (HCC)    Nonischemic cardiomyopathy (HCC)    OSA treated with BiPAP 09/04/2018   Severe obstructive sleep apnea with an AHI of 67.2/h and nocturnal hypoxemia with oxygen saturations as low as 82%. Now on BIPAP at 18/14 cm H2O.   PAF (paroxysmal atrial fibrillation) (HCC)    Palpitations    Paroxysmal atrial flutter (HCC), s/p ablation    Seasonal allergies    Sleep apnea    Thoracic aortic aneurysm (HCC), monitored annually 04/29/2019   Vitamin D  deficiency, vitamin D  = 6.9 (03/28/19) 04/29/2019   Past Surgical History:  Procedure Laterality Date   ATRIAL FLUTTER ABLATION N/A 01/18/2014   Procedure: ATRIAL FLUTTER ABLATION;  Surgeon: Elspeth JAYSON Sage, MD;  Location: [facility] CATH LAB;   Service: Cardiovascular;  Laterality: N/A;   CARDIAC DEFIBRILLATOR PLACEMENT  02/17/10   St. Jude Medical 45DR, model number [id number], serial number [id number]   CARDIOVERSION N/A 12/29/2013   Procedure: CARDIOVERSION;  Surgeon: Elspeth JAYSON Sage, MD;  Location: [facility] OR;  Service: Cardiovascular;  Laterality: N/A;   ICD GENERATOR CHANGEOUT N/A 03/24/2020   Procedure: ICD GENERATOR CHANGEOUT;  Surgeon: Francyne Headland, MD;  Location: [facility] INVASIVE CV LAB;  Service: Cardiovascular;  Laterality: N/A;   TOOTH EXTRACTION N/A 10/23/2012   Procedure: EXTRACTION TEETH 1, 16, 17, 30, 31;  Surgeon: Glendia CHRISTELLA Primrose, DDS;  Location: [facility] OR;  Service: Oral Surgery;  Laterality: N/A;   Family History  Problem Relation Age of Onset   Hodgkin's lymphoma Brother 33   Heart disease Mother    Depression Mother    Anxiety disorder Mother  Arrhythmia Mother    Colon polyps Father    Heart disease Maternal Grandfather    Diabetes Paternal Grandmother    Irritable bowel syndrome Maternal Aunt        2 or 3 mat aunts have IBS   Social History   Socioeconomic History   Marital status: Married    Spouse name: Not on file   Number of children: 1   Years of education: Not on file   Highest education level: 12th grade  Occupational History   Occupation: disabled  Tobacco Use   Smoking status: Never   Smokeless tobacco: Never  Vaping Use   Vaping status: Never Used  Substance and Sexual Activity   Alcohol use: No   Drug use: No   Sexual activity: Yes  Other Topics Concern   Not on file  Social History Narrative   Not on file   Social Drivers of Health   Financial Resource Strain: Low Risk  (02/09/2024)   Overall Financial Resource Strain (CARDIA)    Difficulty of Paying Living Expenses: Not hard at all  Recent Concern: Financial Resource Strain - High Risk (12/29/2023)   Overall Financial Resource Strain (CARDIA)    Difficulty of Paying Living Expenses: Hard  Food Insecurity: No Food Insecurity  (02/09/2024)   Hunger Vital Sign    Worried About Running Out of Food in the Last Year: Never true    Ran Out of Food in the Last Year: Never true  Recent Concern: Food Insecurity - Food Insecurity Present (12/29/2023)   Hunger Vital Sign    Worried About Running Out of Food in the Last Year: Sometimes true    Ran Out of Food in the Last Year: Often true  Transportation Needs: No Transportation Needs (02/09/2024)   PRAPARE - Administrator, Civil Service (Medical): No    Lack of Transportation (Non-Medical): No  Physical Activity: Inactive (02/09/2024)   Exercise Vital Sign    Days of Exercise per Week: 0 days    Minutes of Exercise per Session: 0 min  Stress: No Stress Concern Present (02/09/2024)   [employer] of Occupational Health - Occupational Stress Questionnaire    Feeling of Stress: Only a little  Recent Concern: Stress - Stress Concern Present (12/29/2023)   [employer] of Occupational Health - Occupational Stress Questionnaire    Feeling of Stress: Very much  Social Connections: Moderately Integrated (02/09/2024)   Social Connection and Isolation Panel    Frequency of Communication with Friends and Family: Three times a week    Frequency of Social Gatherings with Friends and Family: Never    Attends Religious Services: 1 to 4 times per year    Active Member of [employer] or Organizations: No    Attends Engineer, structural: Not on file    Marital Status: Married    Tobacco Counseling Counseling given: Yes    Clinical Intake:  Pre-visit preparation completed: Yes  Pain : No/denies pain     BMI - recorded: 29.41 Nutritional Status: BMI 25 -29 Overweight Nutritional Risks: None Diabetes: Yes  Lab Results  Component Value Date   HGBA1C 5.0 09/29/2023   HGBA1C 5.3 06/04/2021   HGBA1C 6.1 05/15/2020     How often do you need to have someone help you when you read instructions, pamphlets, or other written materials from your doctor or  pharmacy?: 1 - Never  Interpreter Needed?: No  Information entered by :: alia t/cma   Activities of Daily Living  02/09/2024    8:04 AM  In your present state of health, do you have any difficulty performing the following activities:  Hearing? 0  Vision? 0  Difficulty concentrating or making decisions? 0  Walking or climbing stairs? 0  Dressing or bathing? 0  Doing errands, shopping? 0  Preparing Food and eating ? N  Using the Toilet? N  In the past six months, have you accidently leaked urine? N  Do you have problems with loss of bowel control? N  Managing your Medications? N  Managing your Finances? N  Housekeeping or managing your Housekeeping? N    Patient Care Team: Dettinger, Fonda LABOR, MD as PCP - General (Family Medicine) Croitoru, Jerel, MD as PCP - Cardiology (Cardiology) Inocencio Soyla Lunger, MD as PCP - Electrophysiology (Cardiology)  I have updated your Care Teams any recent Medical Services you may have received from other providers in the past year.     Assessment:   This is a routine wellness examination for Danny.  Hearing/Vision screen Hearing Screening - Comments:: Pt denies hearing dif Vision Screening - Comments:: Pt wear glasses/[facility] In [place]/last 3 mos ago   Goals Addressed             This Visit's Progress    Exercise 3x per week (30 min per time)   On track      Depression Screen     02/09/2024    8:06 AM 02/06/2024    9:02 AM 12/30/2023    8:22 AM 09/29/2023    9:58 AM 06/30/2023   11:25 AM 12/15/2022    8:04 AM 12/23/2021    4:01 PM  PHQ 2/9 Scores  PHQ - 2 Score 6 5 6 2 5  0 6  PHQ- 9 Score 18 21 21 18 21  22     Fall Risk     02/09/2024    8:02 AM 02/06/2024    9:01 AM 12/30/2023    8:21 AM 09/29/2023    9:58 AM 06/30/2023   11:13 AM  Fall Risk   Falls in the past year? 0 0 1 1 0  Number falls in past yr: 0  1 1 0  Injury with Fall? 0  0 0 0  Risk for fall due to : No Fall Risks  -- Impaired balance/gait;Medication  side effect No Fall Risks  Risk for fall due to: Comment   hypotension    Follow up Falls evaluation completed  Falls evaluation completed Falls evaluation completed Falls evaluation completed    MEDICARE RISK AT HOME:  Medicare Risk at Home Any stairs in or around the home?: Yes If so, are there any without handrails?: Yes Home free of loose throw rugs in walkways, pet beds, electrical cords, etc?: Yes Adequate lighting in your home to reduce risk of falls?: Yes Life alert?: No Use of a cane, walker or w/c?: No Grab bars in the bathroom?: Yes Shower chair or bench in shower?: No Elevated toilet seat or a handicapped toilet?: Yes  TIMED UP AND GO:  Was the test performed?  no  Cognitive Function: 6CIT completed    09/14/2017    8:45 AM 02/10/2015    9:37 AM  MMSE - Mini Mental State Exam  Orientation to time 5 5   Orientation to Place 5 5   Registration 3 3   Attention/ Calculation 5 5   Recall 3 2   Language- name 2 objects 2 2   Language- repeat 1 1  Language-  follow 3 step command 3 3   Language- read & follow direction 1 1   Write a sentence 1 1   Copy design 1 1   Total score 30 29      Data saved with a previous flowsheet row definition        02/09/2024    8:15 AM 12/15/2022    8:06 AM  6CIT Screen  What Year? 0 points 0 points  What month? 0 points 0 points  What time? 0 points 0 points  Count back from 20 0 points 0 points  Months in reverse 4 points 0 points  Repeat phrase 2 points 0 points  Total Score 6 points 0 points    Immunizations Immunization History  Administered Date(s) Administered   Influenza,inj,Quad PF,6+ Mos 02/07/2013, 03/26/2015, 03/11/2016   Influenza-Unspecified 02/06/2014, 12/26/2017   Pneumococcal Polysaccharide-23 12/28/2013   Td 01/18/2012    Screening Tests Health Maintenance  Topic Date Due   Diabetic kidney evaluation - Urine ACR  Never done   Hepatitis B Vaccines 19-59 Average Risk (1 of 3 - 19+ 3-dose series) Never  done   HPV VACCINES (1 - 3-dose SCDM series) Never done   FOOT EXAM  08/13/2021   COVID-19 Vaccine (1 - 2024-25 season) Never done   Influenza Vaccine  08/07/2024 (Originally 12/09/2023)   DTaP/Tdap/Td (2 - Tdap) 09/28/2024 (Originally 01/17/2022)   Pneumococcal Vaccine (2 of 2 - PCV) 09/28/2024 (Originally 12/29/2014)   Hepatitis C Screening  09/28/2024 (Originally 07/22/1999)   HIV Screening  09/28/2024 (Originally 07/21/1996)   HEMOGLOBIN A1C  03/31/2024   Diabetic kidney evaluation - eGFR measurement  01/25/2025   Medicare Annual Wellness (AWV)  02/08/2025   Meningococcal B Vaccine  Aged Out   OPHTHALMOLOGY EXAM  Discontinued    Health Maintenance Items Addressed: See Nurse Notes at the end of this note  Additional Screening:  Vision Screening: Recommended annual ophthalmology exams for early detection of glaucoma and other disorders of the eye. Is the patient up to date with their annual eye exam?  Yes  Who is the provider or what is the name of the office in which the patient attends annual eye exams? [facility]   Dental Screening: Recommended annual dental exams for proper oral hygiene  Community Resource Referral / Chronic Care Management: CRR required this visit?  No   CCM required this visit?  No   Plan:    I have personally reviewed and noted the following in the patient's chart:   Medical and social history Use of alcohol, tobacco or illicit drugs  Current medications and supplements including opioid prescriptions. Patient is currently taking opioid prescriptions. Information provided to patient regarding non-opioid alternatives. Patient advised to discuss non-opioid treatment plan with their provider. Functional ability and status Nutritional status Physical activity Advanced directives List of other physicians Hospitalizations, surgeries, and ER visits in previous 12 months Vitals Screenings to include cognitive, depression, and falls Referrals and  appointments  In addition, I have reviewed and discussed with patient certain preventive protocols, quality metrics, and best practice recommendations. A written personalized care plan for preventive services as well as general preventive health recommendations were provided to patient.   Ozie Ned, CMA   02/09/2024   After Visit Summary: (MyChart) Due to this being a telephonic visit, the after visit summary with patients personalized plan was offered to patient via MyChart   Notes: PCP Follow Up Recommendations: pt is aware and due the following: UrineACR, foot  exam, Hep B vaccine/HPV vaccine

## 2024-02-09 NOTE — Telephone Encounter (Signed)
 I called and spoke with patient and made him aware. He voiced understanding.

## 2024-02-09 NOTE — Telephone Encounter (Signed)
 Okay I agree that it did improve back to 5.4 and that is normal, 2.7 was where it was really low and I was concerned about it but we will just monitor it in the future and he does not have to go for the referral.

## 2024-02-09 NOTE — Patient Instructions (Signed)
 [name],  Thank you for taking the time for your Medicare Wellness Visit. I appreciate your continued commitment to your health goals. Please review the care plan we discussed, and feel free to reach out if I can assist you further.  Medicare recommends these wellness visits once per year to help you and your care team stay ahead of potential health issues. These visits are designed to focus on prevention, allowing your provider to concentrate on managing your acute and chronic conditions during your regular appointments.  Please note that Annual Wellness Visits do not include a physical exam. Some assessments may be limited, especially if the visit was conducted virtually. If needed, we may recommend a separate in-person follow-up with your provider.  Ongoing Care Seeing your primary care provider every 3 to 6 months helps us  monitor your health and provide consistent, personalized care.   Referrals If a referral was made during today's visit and you haven't received any updates within two weeks, please contact the referred provider directly to check on the status.  Recommended Screenings:  Health Maintenance  Topic Date Due   Yearly kidney health urinalysis for diabetes  Never done   Hepatitis B Vaccine (1 of 3 - 19+ 3-dose series) Never done   HPV Vaccine (1 - 3-dose SCDM series) Never done   Complete foot exam   08/13/2021   Medicare Annual Wellness Visit  12/15/2023   COVID-19 Vaccine (1 - 2024-25 season) Never done   Flu Shot  08/07/2024*   DTaP/Tdap/Td vaccine (2 - Tdap) 09/28/2024*   Pneumococcal Vaccine (2 of 2 - PCV) 09/28/2024*   Hepatitis C Screening  09/28/2024*   HIV Screening  09/28/2024*   Hemoglobin A1C  03/31/2024   Yearly kidney function blood test for diabetes  01/25/2025   Meningitis B Vaccine  Aged Out   Eye exam for diabetics  Discontinued  *Topic was postponed. The date shown is not the original due date.       02/09/2024    8:06 AM  Advanced Directives   Does Patient Have a Medical Advance Directive? No   Advance Care Planning is important because it: Ensures you receive medical care that aligns with your values, goals, and preferences. Provides guidance to your family and loved ones, reducing the emotional burden of decision-making during critical moments.  Vision: Annual vision screenings are recommended for early detection of glaucoma, cataracts, and diabetic retinopathy. These exams can also reveal signs of chronic conditions such as diabetes and high blood pressure.  Dental: Annual dental screenings help detect early signs of oral cancer, gum disease, and other conditions linked to overall health, including heart disease and diabetes.  Please see the attached documents for additional preventive care recommendations.

## 2024-02-09 NOTE — Telephone Encounter (Unsigned)
 Copied from CRM [id number]. Topic: Referral - Status >> Feb 09, 2024  2:58 PM Rosaria E wrote: Reason for CRM: Pt wants to know if he still needs to proceed with his referral. He was just seen at the ER, the doctor told him that his levels are normal now and the abnormality previously was most likely due to the kidney stones he had.   Pt is seeking advice from PCP  Best contact: [phone]

## 2024-02-21 ENCOUNTER — Encounter

## 2024-02-28 ENCOUNTER — Other Ambulatory Visit: Payer: Self-pay | Admitting: Cardiovascular Disease

## 2024-02-29 ENCOUNTER — Encounter: Payer: Self-pay | Admitting: Cardiovascular Disease

## 2024-02-29 DIAGNOSIS — I4719 Other supraventricular tachycardia: Secondary | ICD-10-CM

## 2024-03-05 ENCOUNTER — Other Ambulatory Visit: Payer: Self-pay | Admitting: *Deleted

## 2024-03-05 MED ORDER — CARVEDILOL 25 MG PO TABS: 12.5000 mg | ORAL_TABLET | Freq: Two times a day (BID) | ORAL | 0 refills | Status: DC

## 2024-03-05 MED ORDER — SACUBITRIL-VALSARTAN 49-51 MG PO TABS: 1.0000 | ORAL_TABLET | Freq: Two times a day (BID) | ORAL | 0 refills | Status: DC

## 2024-03-05 MED ORDER — WARFARIN SODIUM 5 MG PO TABS: ORAL_TABLET | ORAL | 1 refills | Status: AC

## 2024-03-05 MED ORDER — FUROSEMIDE 40 MG PO TABS: 40.0000 mg | ORAL_TABLET | Freq: Every day | ORAL | 0 refills | Status: DC | PRN

## 2024-03-05 MED ORDER — METOPROLOL TARTRATE 25 MG PO TABS: ORAL_TABLET | ORAL | 0 refills | Status: AC

## 2024-03-12 ENCOUNTER — Ambulatory Visit: Attending: Cardiovascular Disease | Admitting: *Deleted

## 2024-03-12 DIAGNOSIS — I480 Paroxysmal atrial fibrillation: Secondary | ICD-10-CM | POA: Diagnosis not present

## 2024-03-12 DIAGNOSIS — Z5181 Encounter for therapeutic drug level monitoring: Secondary | ICD-10-CM | POA: Diagnosis not present

## 2024-03-12 LAB — POCT INR: INR: 1.5 — AB (ref 2.0–3.0)

## 2024-03-12 NOTE — Progress Notes (Signed)
 INR-1.5; Please see anticoagulation encounter

## 2024-03-12 NOTE — Patient Instructions (Signed)
 Take warfarin 2 tablets tonight then increase dose to 1 tablet daily except 1 1/2 tablets on Mondays, Wednesdays and Fridays.  Recheck 2 wks

## 2024-03-20 ENCOUNTER — Ambulatory Visit: Payer: Medicaid Other

## 2024-03-20 DIAGNOSIS — I428 Other cardiomyopathies: Secondary | ICD-10-CM

## 2024-03-21 LAB — CUP PACEART REMOTE DEVICE CHECK
Battery Remaining Longevity: 65 mo
Battery Remaining Percentage: 62 %
Battery Voltage: 2.98 V
Brady Statistic AP VP Percent: 1 %
Brady Statistic AP VS Percent: 9.2 %
Brady Statistic AS VP Percent: 1 %
Brady Statistic AS VS Percent: 91 %
Brady Statistic RA Percent Paced: 8.4 %
Brady Statistic RV Percent Paced: 1 %
Date Time Interrogation Session: 20251111020246
HighPow Impedance: 42 Ohm
Implantable Lead Connection Status: 753985
Implantable Lead Connection Status: 753985
Implantable Lead Implant Date: 20111010
Implantable Lead Implant Date: 20111010
Implantable Lead Location: 753859
Implantable Lead Location: 753860
Implantable Pulse Generator Implant Date: 20211115
Lead Channel Impedance Value: 400 Ohm
Lead Channel Impedance Value: 440 Ohm
Lead Channel Pacing Threshold Amplitude: 0.75 V
Lead Channel Pacing Threshold Amplitude: 1 V
Lead Channel Pacing Threshold Pulse Width: 0.5 ms
Lead Channel Pacing Threshold Pulse Width: 1 ms
Lead Channel Sensing Intrinsic Amplitude: 11.7 mV
Lead Channel Sensing Intrinsic Amplitude: 3.1 mV
Lead Channel Setting Pacing Amplitude: 2.5 V
Lead Channel Setting Pacing Amplitude: 2.5 V
Lead Channel Setting Pacing Pulse Width: 0.5 ms
Lead Channel Setting Sensing Sensitivity: 0.5 mV
Pulse Gen Serial Number: 111031056
Zone Setting Status: 755011

## 2024-03-23 NOTE — Progress Notes (Signed)
 Remote ICD Transmission

## 2024-03-26 ENCOUNTER — Ambulatory Visit: Attending: Cardiovascular Disease | Admitting: *Deleted

## 2024-03-26 DIAGNOSIS — I480 Paroxysmal atrial fibrillation: Secondary | ICD-10-CM | POA: Insufficient documentation

## 2024-03-26 DIAGNOSIS — Z5181 Encounter for therapeutic drug level monitoring: Secondary | ICD-10-CM | POA: Insufficient documentation

## 2024-03-26 LAB — POCT INR: INR: 3.1 — AB (ref 2.0–3.0)

## 2024-03-26 NOTE — Progress Notes (Signed)
 INR 3.1  Please see anticoagulation encounter

## 2024-03-26 NOTE — Patient Instructions (Signed)
Continue warfarin 1 tablet daily except 1 1/2 tablets on Mondays, Wednesdays and Fridays. Recheck 4 wks

## 2024-04-02 ENCOUNTER — Ambulatory Visit: Payer: Self-pay | Admitting: Cardiovascular Disease

## 2024-04-19 ENCOUNTER — Ambulatory Visit: Attending: Internal Medicine | Admitting: Cardiovascular Disease

## 2024-04-19 ENCOUNTER — Encounter: Payer: Self-pay | Admitting: Cardiovascular Disease

## 2024-04-19 VITALS — BP 84/52 | HR 60 | Ht 75.0 in | Wt 251.0 lb

## 2024-04-19 DIAGNOSIS — Z95810 Presence of automatic (implantable) cardiac defibrillator: Secondary | ICD-10-CM | POA: Insufficient documentation

## 2024-04-19 DIAGNOSIS — I428 Other cardiomyopathies: Secondary | ICD-10-CM | POA: Insufficient documentation

## 2024-04-19 DIAGNOSIS — I480 Paroxysmal atrial fibrillation: Secondary | ICD-10-CM | POA: Diagnosis present

## 2024-04-19 DIAGNOSIS — I4719 Other supraventricular tachycardia: Secondary | ICD-10-CM | POA: Insufficient documentation

## 2024-04-19 DIAGNOSIS — G4733 Obstructive sleep apnea (adult) (pediatric): Secondary | ICD-10-CM | POA: Diagnosis present

## 2024-04-19 DIAGNOSIS — I10 Essential (primary) hypertension: Secondary | ICD-10-CM | POA: Insufficient documentation

## 2024-04-19 DIAGNOSIS — E669 Obesity, unspecified: Secondary | ICD-10-CM | POA: Diagnosis present

## 2024-04-19 DIAGNOSIS — Z7901 Long term (current) use of anticoagulants: Secondary | ICD-10-CM | POA: Diagnosis present

## 2024-04-19 DIAGNOSIS — Z5181 Encounter for therapeutic drug level monitoring: Secondary | ICD-10-CM | POA: Insufficient documentation

## 2024-04-19 DIAGNOSIS — I5042 Chronic combined systolic (congestive) and diastolic (congestive) heart failure: Secondary | ICD-10-CM | POA: Insufficient documentation

## 2024-04-19 DIAGNOSIS — I7121 Aneurysm of the ascending aorta, without rupture: Secondary | ICD-10-CM | POA: Diagnosis present

## 2024-04-19 MED ORDER — FUROSEMIDE 40 MG PO TABS: 40.0000 mg | ORAL_TABLET | Freq: Every day | ORAL | 3 refills | Status: AC | PRN

## 2024-04-19 NOTE — Patient Instructions (Addendum)
 Medication Instructions:  No changes *If you need a refill on your cardiac medications before your next appointment, please call your pharmacy*  Lab Work: None ordered If you have labs (blood work) drawn today and your tests are completely normal, you will receive your results only by: MyChart Message (if you have MyChart) OR A paper copy in the mail If you have any lab test that is abnormal or we need to change your treatment, we will call you to review the results.  Testing/Procedures: None ordered  Follow-Up: At [facility], you and your health needs are our priority.  As part of our continuing mission to provide you with exceptional heart care, our providers are all part of one team.  This team includes your primary Cardiologist (physician) and Advanced Practice Providers or APPs (Physician Assistants and Nurse Practitioners) who all work together to provide you with the care you need, when you need it.  Your next appointment:   First available with Dr Shlomo- sleep clinic  Dr Francyne 1 year  We recommend signing up for the patient portal called MyChart.  Sign up information is provided on this After Visit Summary.  MyChart is used to connect with patients for Virtual Visits (Telemedicine).  Patients are able to view lab/test results, encounter notes, upcoming appointments, etc.  Non-urgent messages can be sent to your provider as well.   To learn more about what you can do with MyChart, go to [email or web address].

## 2024-04-19 NOTE — Progress Notes (Unsigned)
 Patient ID: Danny Hinton, male   DOB: Dec 13, 1980, 43 y.o.   MRN: [id number]     Cardiology Office Note    Date:  04/21/2024   ID:  Danny Hinton, DOB 1980-05-15, MRN [id number]  PCP:  Dettinger, Fonda LABOR, MD  Cardiologist:   Jerel Balding, MD   Chief Complaint  Patient presents with   Congestive Heart Failure    History of Present Illness:  Danny Hinton is a 43 y.o. male who presents in follow-up for nonischemic cardiomyopathy, chronic systolic and diastolic heart failure, nonischemic cardiomyopathy, s/p ICD, history of atrial flutter, atrial fibrillation and paroxysmal atrial tachycardia (which has been sustained and led to heart failure decompensation).  He underwent uncomplicated defibrillator generator change out on March 24, 2020 (his new generator is a St Jude [employer]).  After successful gastric bypass surgery (Roux-en-Y, March 2022) he has lost about 224 pounds, almost 50% of his initial weight of 460 pounds.  He has largely maintained his weight since then.  Continues to generally feel well, but intolerant of the heat during the summer months due to dizziness and dyspnea.  Blood pressure remains quite low at 84/52 today, but he denies any dizziness currently.  He has not experienced syncope.  He denies lower extremity edema, orthopnea, PND, chest pain at rest or with activity.  Currently without problems with orthostatic dizziness.  Occasionally has brief palpitations that take his breath away, which seem to correlate with brief bursts of atrial tachycardia.  The symptoms only last for a few seconds usually.  He does complain of fatigue, which is present all the time.  Has some symptoms of depression, worried about financial obligations.  He needs some dog therapy; he always feels better after he can take his beagles out during deer hunting season.  His wife is disabled due to multiple sclerosis and has had some recent problems.  He was diagnosed with B12 deficiency and received  supplementation.  He passed a kidney stone.  He has not had any serious bleeding problems on warfarin anticoagulation.  He denies falls.  Continues to have prominent varicose veins especially in the distribution of the left greater saphenous vein, but does not have any edema or ulcerations.  On his most recent echocardiogram LV systolic function remains mildly-moderately depressed with an ejection fraction of 40%.  He has had a couple of urinary tract infections this year.  He had a neurological examination that did not show problems with his prostate or kidney stones.  As before, when we check his device he is very sensitive to both atrial and ventricular lead capture threshold testing.  He is exquisitely aware of changes in his heart rate, even when paced at physiological rates.  Presenting rhythm is normal sinus rhythm.  Estimated generator longevity is just over 5 years and all lead parameters are normal.  Actively tested atrial and ventricular capture thresholds today.  Device function is normal with only 8% atrial pacing and less than 1% ventricular pacing.  He has rare episodes of paroxysmal atrial tachycardia may be atypical flutter, usually just a few seconds long, occasionally sustained but never more than 10 minutes.  Typically the atrial rate is around 190 bpm, usually with normal ventricular rates due to AV block.  The device has reported some episodes of nonsustained ventricular tachycardia but I think all of them are atrial driven (at least the ones that have recorded electrograms).  ICD interrogation shows normal findings.  Lead parameters are excellent.  He  has not had any episodes of ventricular tachycardia since February 2022 (pretty much since his gastric bypass surgery).  There is also been a marked reduction in the burden of atrial arrhythmia.  In the last 12 months, his device has only recorded 2 episodes of paroxysmal atrial tachycardia with 1: 1 conduction, both of them quite  brief.  He has not had atrial flutter or atrial fibrillation.  He only has roughly 10% atrial pacing and does not require ventricular pacing.   In September 2015 he underwent cavotricuspid isthmus ablation for atrial flutter. On 08/19/2014, while turkey hunting he develop persistent rapid palpitations and felt unwell. He underwent successful overdrive pacing via his device by Dr. Lynwood Rakers. The rhythm was atypical atrial flutter, cycle length roughly 390 ms. The episode lasted for about 3 hours until he was successfully overdrive paced. In the past he has had paroxysmal atrial fibrillation. The decision was made to ablate his flutter secondary to the occurrence of multiple unnecessary defibrillator shocks in the setting of atrial flutter with rapid ventricular rate.   Past Medical History:  Diagnosis Date   Anxiety    Atypical atrial flutter (HCC) 08/19/2014   Pace terminated in device clinic.  AFL cycle length was 390 msec.   Automatic implantable cardioverter-defibrillator in situ    a. St Jude in 2011.   B12 nutritional deficiency, B12 = 214 (03/18/19) 04/29/2019   Chronic anticoagulation with Coumadin     Chronic systolic congestive heart failure (HCC)    a. suspected NICM - dx 2010. EF 15% by TEE, 10-20% by echo at that time. b. s/p St. Jude AICD 2011. c. Echo 01/2010: mod dilated LV, EF 30%, mod aortic root dilitation, no significant valvular disease.   Depression    Diabetes mellitus (HCC), Rx Victoza  04/29/2019   Dyslipidemia associated with type 2 diabetes mellitus (HCC), Rx Lipitor 04/29/2019   Eczema    Enlarged aorta    Hypertension associated with diabetes (HCC)    Mobitz type 2 second degree atrioventricular block    a. During sleeping hours in 2010 suspected due to OSA.   Morbid obesity (HCC)    Nonischemic cardiomyopathy (HCC)    OSA treated with BiPAP 09/04/2018   Severe obstructive sleep apnea with an AHI of 67.2/h and nocturnal hypoxemia with oxygen saturations as low  as 82%. Now on BIPAP at 18/14 cm H2O.   PAF (paroxysmal atrial fibrillation) (HCC)    Palpitations    Paroxysmal atrial flutter (HCC), s/p ablation    Seasonal allergies    Sleep apnea    Thoracic aortic aneurysm (HCC), monitored annually 04/29/2019   Vitamin D  deficiency, vitamin D  = 6.9 (03/28/19) 04/29/2019    Past Surgical History:  Procedure Laterality Date   ATRIAL FLUTTER ABLATION N/A 01/18/2014   Procedure: ATRIAL FLUTTER ABLATION;  Surgeon: Elspeth JAYSON Sage, MD;  Location: [facility] CATH LAB;  Service: Cardiovascular;  Laterality: N/A;   CARDIAC DEFIBRILLATOR PLACEMENT  02/17/10   St. Jude Medical 45DR, model number [id number], serial number [id number]   CARDIOVERSION N/A 12/29/2013   Procedure: CARDIOVERSION;  Surgeon: Elspeth JAYSON Sage, MD;  Location: [facility] OR;  Service: Cardiovascular;  Laterality: N/A;   ICD GENERATOR CHANGEOUT N/A 03/24/2020   Procedure: ICD GENERATOR CHANGEOUT;  Surgeon: Francyne Headland, MD;  Location: [facility] INVASIVE CV LAB;  Service: Cardiovascular;  Laterality: N/A;   TOOTH EXTRACTION N/A 10/23/2012   Procedure: EXTRACTION TEETH 1, 16, 17, 30, 31;  Surgeon: Glendia CHRISTELLA Primrose, DDS;  Location: [facility] OR;  Service:  Oral Surgery;  Laterality: N/A;    Outpatient Medications Prior to Visit  Medication Sig Dispense Refill   amoxicillin -clavulanate (AUGMENTIN ) 875-125 MG tablet Take 1 tablet by mouth 2 (two) times daily. 14 tablet 0   carvedilol  (COREG ) 25 MG tablet Take 0.5 tablets (12.5 mg total) by mouth 2 (two) times daily with a meal. 50 tablet 0   cyanocobalamin  (VITAMIN B12) 1000 MCG tablet Take 1,000 mcg by mouth daily.     FLUoxetine  (PROZAC ) 20 MG tablet Take 20 mg by mouth daily.     loratadine  (CLARITIN ) 10 MG tablet Take 1 tablet by mouth in the evening 90 tablet 1   metoprolol  tartrate (LOPRESSOR ) 25 MG tablet Take 1 tablet as needed for palpitations 30 tablet 0   Multiple Vitamin (MULTIVITAMIN WITH MINERALS) TABS tablet Take 1 tablet by mouth daily.     ondansetron  (ZOFRAN )  4 MG tablet Take 1 tablet (4 mg total) by mouth every 6 (six) hours. 12 tablet 0   sacubitril -valsartan  (ENTRESTO ) 49-51 MG Take 1 tablet by mouth 2 (two) times daily. Please keep appointment in December receive future refills. Thank you! 100 tablet 0   Vitamin D , Ergocalciferol , (DRISDOL ) 1.25 MG (50000 UNIT) CAPS capsule Take 1 capsule (50,000 Units total) by mouth every 7 (seven) days. (Patient taking differently: Take 50,000 Units by mouth 2 (two) times a week. Tues and Thurs) 12 capsule 0   warfarin (COUMADIN ) 5 MG tablet TAKE 1 TO 1 & 1/2 (ONE & ONE-HALF) TABLETS BY MOUTH ONCE DAILY OR AS DIRECTED BY COUMADIN  CLINIC 135 tablet 1   Chlorphen-PE-Acetaminophen  4-10-325 MG TABS Take 1 tablet by mouth every 6 (six) hours as needed. (Patient not taking: Reported on 04/19/2024) 20 tablet 0   fluticasone  (FLONASE ) 50 MCG/ACT nasal spray Place 1 spray into both nostrils as needed. (Patient not taking: Reported on 04/19/2024) 16 g 11   oxyCODONE -acetaminophen  (PERCOCET/ROXICET) 5-325 MG tablet Take 1 tablet by mouth every 6 (six) hours as needed for severe pain (pain score 7-10). (Patient not taking: Reported on 04/19/2024) 10 tablet 0   Probiotic Product (VSL#3) CAPS Take twice a day (Patient not taking: Reported on 04/19/2024)     furosemide  (LASIX ) 40 MG tablet Take 1 tablet (40 mg total) by mouth daily as needed. Please keep appointment in December receive future refills. Thank you! (Patient not taking: Reported on 04/19/2024) 30 tablet 0   No facility-administered medications prior to visit.     Allergies:   Patient has no known allergies.   Family History:  The patient's family history includes Anxiety disorder in his mother; Arrhythmia in his mother; Colon polyps in his father; Depression in his mother; Diabetes in his paternal grandmother; Heart disease in his maternal grandfather and mother; Hodgkin's lymphoma (age of onset: 28) in his brother; Irritable bowel syndrome in his maternal aunt.    ROS:   Please see the history of present illness.    ROS All other systems are reviewed and are negative.   PHYSICAL EXAM:   VS:  BP (!) 84/52 (BP Location: Left Arm, Patient Position: Sitting, Cuff Size: Large)   Pulse 60   Ht 6' 3 (1.905 m)   Wt 251 lb (113.9 kg)   SpO2 97%   BMI 31.37 kg/m      General: Alert, oriented x3, no distress, healthy left subclavian ICD site Head: no evidence of trauma, PERRL, EOMI, no exophtalmos or lid lag, no myxedema, no xanthelasma; normal ears, nose and oropharynx Neck: normal jugular venous  pulsations and no hepatojugular reflux; brisk carotid pulses without delay and no carotid bruits Chest: clear to auscultation, no signs of consolidation by percussion or palpation, normal fremitus, symmetrical and full respiratory excursions Cardiovascular: normal position and quality of the apical impulse, regular rhythm, normal first and second heart sounds, no murmurs, rubs or gallops Abdomen: no tenderness or distention, no masses by palpation, no abnormal pulsatility or arterial bruits, normal bowel sounds, no hepatosplenomegaly Extremities: no clubbing, cyanosis or edema; normal distal pulses.  Very prominent serpiginous greater saphenous vein varicosities.  No evidence of chronic skin changes or ulcerations. Neurological: grossly nonfocal Psych: Normal mood and affect     Wt Readings from Last 3 Encounters:  04/19/24 251 lb (113.9 kg)  02/09/24 248 lb (112.5 kg)  02/06/24 248 lb (112.5 kg)      Studies/Labs Reviewed:   ECHO 12/28/2021:  1. Left ventricular ejection fraction, by estimation, is 40 to 45%. The  left ventricle has mildly decreased function. The left ventricle  demonstrates global hypokinesis. The left ventricular internal cavity size  was mildly to moderately dilated. Left  ventricular diastolic parameters are indeterminate.   2. Right ventricular systolic function is normal. The right ventricular  size is mildly enlarged.  There is normal pulmonary artery systolic  pressure.   3. Right atrial size was mildly dilated.   4. The mitral valve is grossly normal. Trivial mitral valve  regurgitation. No evidence of mitral stenosis.   5. The aortic valve is tricuspid. Aortic valve regurgitation is not  visualized. No aortic stenosis is present.   6. Aortic dilatation noted. Aneurysm of the aortic root, measuring 46 mm.  There is mild dilatation of the ascending aorta.   7. The inferior vena cava is normal in size with greater than 50%  respiratory variability, suggesting right atrial pressure of 3 mmHg.   Comparison(s): No significant change from prior study. 01/27/18 EF 40%.   EKG:    EKG Interpretation Date/Time:  Thursday April 19 2024 09:02:21 EST Ventricular Rate:  60 PR Interval:  202 QRS Duration:  110 QT Interval:  382 QTC Calculation: 382 R Axis:   -17  Text Interpretation: Normal sinus rhythm Normal ECG When compared with ECG of 25-Nov-2022 08:55, No significant change was found Confirmed by Giuseppe Duchemin [phone]) on 04/19/2024 9:19:21 AM        Recent Labs: 01/26/2024: ALT 19; BUN 11; Creatinine, Ser 0.83; Hemoglobin 13.4; Platelets 255; Potassium 3.5; Sodium 139   Lipid Panel    Component Value Date/Time   CHOL 164 09/29/2023 0938   TRIG 83 09/29/2023 0938   HDL 43 09/29/2023 0938   CHOLHDL 3.8 09/29/2023 0938   CHOLHDL 5.3 03/20/2009 0410   VLDL 22 03/20/2009 0410   LDLCALC 105 (H) 09/29/2023 0938   LDLDIRECT 52 03/26/2015 0824   09/29/2023 hemoglobin A1c 5.0%  ASSESSMENT:    1. Paroxysmal atrial fibrillation (HCC)   2. Chronic combined systolic and diastolic heart failure (HCC)   3. Non-ischemic cardiomyopathy (HCC)   4. Paroxysmal atrial tachycardia   5. Encounter for therapeutic drug monitoring   6. Long term current use of anticoagulant therapy, Coumadin    7. Essential hypertension   8. ICD (implantable cardioverter-defibrillator) in place   9. Mild obesity   10. OSA  treated with BiPAP   11. Aneurysm of ascending aorta without rupture      PLAN:  In order of problems listed above:    CHF: Due to nonischemic cardiomyopathy.  Typically NYHA functional class  II.  Appears clinically euvolemic.  Only takes loop diuretics intermittently.  We have decided not to use SGLT2 inhibitors due to to his history of recurrent unexplained urinary tract infections unusual for a young man.  On maximum tolerated doses of carvedilol  and Entresto  (his blood pressure is quite low and we have had to reduce the doses of his heart failure medicines.  No room to add spironolactone . Although with medical therapy his ejection fraction has improved from its worst level, he still has moderately depressed LVEF 40%.   Paroxysmal atrial tachycardia/atypical flutter/AFib: with history of previous cavotricuspid isthmus flutter ablation.  He continues to have occasional episodes of sustained atrial tachycardia with rates around 190 bpm, but the overall burden of mode switches under 1%.  Has not had any sustained high ventricular rates.  Only antiarrhythmic he is on currently is carvedilol .   Warfarin: Has not had any bleeding complications. ICD: Normal device function.  Remote downloads every 3 months and yearly office visits. Mildly obese: He has gained back just a little bit of weight and is back in mildly obese range, but is still roughly half the weight he was before his gastric bypass HLP: Most recent lipid profile shows a borderline HDL of 40 and LDL 105, normal triglycerides.  He does not have known CAD or PAD. DM: Resolved after massive weight loss.  Recent hemoglobin A1c 5.0%. OSA: He is compliant with BiPAP and denies hypersomnolence.  Needs follow-up in sleep clinic to see if his fatigue is due to inadequate settings on CPAP (depression may also be playing a role). TAAA: Has not had any imaging studies since the last CT angiogram 10/08/2021: Thoracic Aorta: --Ascending Aorta: 3.7 cm,  previously 4.0 cm --Aortic Arch: 3.3 cm --Descending Aorta: 3.1 cm, previously 3.3 cm No aortic root measurement reported, but by my measurement 45 mm. When adjusted for his body size (6'5 tall) these aortic diameters are actually not at all impressive.  Will increase the frequency of scans to every 3 years, due in 2026.  Perform sooner if he develops chest pain symptoms.  Medication Adjustments/Labs and Tests Ordered: Current medicines are reviewed at length with the patient today.  Concerns regarding medicines are outlined above.  Medication changes, Labs and Tests ordered today are listed in the Patient Instructions below. Patient Instructions  Medication Instructions:  No changes *If you need a refill on your cardiac medications before your next appointment, please call your pharmacy*  Lab Work: None ordered If you have labs (blood work) drawn today and your tests are completely normal, you will receive your results only by: MyChart Message (if you have MyChart) OR A paper copy in the mail If you have any lab test that is abnormal or we need to change your treatment, we will call you to review the results.  Testing/Procedures: None ordered  Follow-Up: At [facility], you and your health needs are our priority.  As part of our continuing mission to provide you with exceptional heart care, our providers are all part of one team.  This team includes your primary Cardiologist (physician) and Advanced Practice Providers or APPs (Physician Assistants and Nurse Practitioners) who all work together to provide you with the care you need, when you need it.  Your next appointment:   First available with Dr Shlomo- sleep clinic  Dr Francyne 1 year  We recommend signing up for the patient portal called MyChart.  Sign up information is provided on this After Visit Summary.  MyChart  is used to connect with patients for Virtual Visits (Telemedicine).  Patients are able to view  lab/test results, encounter notes, upcoming appointments, etc.  Non-urgent messages can be sent to your provider as well.   To learn more about what you can do with MyChart, go to [email or web address].        Signed, Jerel Balding, MD  04/21/2024 11:44 AM    [facility] [address] [place], [place], [place]  [address] Phone: [phone]; Fax: [phone]

## 2024-04-21 ENCOUNTER — Other Ambulatory Visit: Payer: Self-pay | Admitting: Cardiovascular Disease

## 2024-04-21 ENCOUNTER — Encounter: Payer: Self-pay | Admitting: Cardiovascular Disease

## 2024-04-21 DIAGNOSIS — I4719 Other supraventricular tachycardia: Secondary | ICD-10-CM

## 2024-04-23 ENCOUNTER — Ambulatory Visit

## 2024-04-25 ENCOUNTER — Ambulatory Visit: Payer: Self-pay

## 2024-04-25 NOTE — Telephone Encounter (Signed)
 FYI Only or Action Required?: FYI only for provider: appointment scheduled on 04/26/24.  Patient was last seen in primary care on 02/06/2024 by Gladis Mustard, FNP.  Called Nurse Triage reporting Back Pain.  Symptoms began 2 days ago.  Interventions attempted: Nothing.  Symptoms are: gradually worsening.  Triage Disposition: See Physician Within 24 Hours  Patient/caregiver understands and will follow disposition?:   Copied from CRM #[id number]. Topic: Clinical - Red Word Triage >> Apr 25, 2024 10:06 AM Kevelyn M wrote: Red Word that prompted transfer to Nurse Triage: Pain in left lower side from possibly a kidney stone. He passed one recently and went to the ER. Pain went away. He slowly feels pain coming back over the last day or so. An appointment was scheduled for Thursday but felt he needed to speak to a nurse just in case. Reason for Disposition  [1] MODERATE pain (e.g., interferes with normal activities) AND [2] pain comes and goes AND [3] present > 24 hours  Answer Assessment - Initial Assessment Questions 1. MAIN CONCERN OR SYMPTOM:  What is your main concern right now? What question do you have? What's the main symptom you're worried about? (e.g., blood in urine, flank pain)     Pain in left flank area  2. ONSET: When did the  flank pain  start?     2 days ago 3. BETTER-SAME-WORSE: Are you getting better, staying the same, or getting worse compared to how you felt at your last visit to the doctor (most recent medical visit)?     Pain is worsening 4. VISIT DATE: When were you seen? (Date)     Hospitalized 2 months ago and passed kidney stone 5. VISIT DOCTOR: What is the name of the doctor (or NP/PA) taking care of you now?      6. VISIT DIAGNOSIS:  What was the main symptom or problem that you were seen for? Were you given a diagnosis?       7. TREATMENT: Did you have any treatment for your kidney stone? (e.g., none, doctor exam, lithotripsy,  medicines, stent) If Yes, ask: When did you have this treatment?     Pain medication 8. NEXT APPOINTMENT: Do you have a follow-up appointment with your doctor?      9. PAIN: Is there any pain? If Yes, ask: How bad is it?  (Scale 0-10; or none, mild, moderate, severe)     5/10 increasing with movement 10. FEVER: Do you have a fever? If Yes, ask: What is it, how was it measured, and when did it start?       denies 11. OTHER SYMPTOMS: Do you have any other symptoms? (e.g., abdomen pain, blood in urine, vomiting)        Wife states urine discolored  Protocols used: Kidney Stone Follow-up Call-A-AH

## 2024-04-25 NOTE — Telephone Encounter (Signed)
 Appt made.

## 2024-04-26 ENCOUNTER — Ambulatory Visit: Attending: Cardiovascular Disease

## 2024-04-26 ENCOUNTER — Ambulatory Visit: Admitting: Nurse Practitioner

## 2024-04-26 VITALS — BP 107/63 | HR 57 | Temp 97.8°F | Ht 75.0 in | Wt 256.4 lb

## 2024-04-26 DIAGNOSIS — R10A2 Flank pain, left side: Secondary | ICD-10-CM

## 2024-04-26 DIAGNOSIS — Z5181 Encounter for therapeutic drug level monitoring: Secondary | ICD-10-CM | POA: Insufficient documentation

## 2024-04-26 DIAGNOSIS — R109 Unspecified abdominal pain: Secondary | ICD-10-CM

## 2024-04-26 DIAGNOSIS — I480 Paroxysmal atrial fibrillation: Secondary | ICD-10-CM | POA: Diagnosis present

## 2024-04-26 DIAGNOSIS — R3989 Other symptoms and signs involving the genitourinary system: Secondary | ICD-10-CM

## 2024-04-26 LAB — URINALYSIS, ROUTINE W REFLEX MICROSCOPIC
Bilirubin, UA: NEGATIVE
Glucose, UA: NEGATIVE
Ketones, UA: NEGATIVE
Nitrite, UA: NEGATIVE
Protein,UA: NEGATIVE
Specific Gravity, UA: 1.025 (ref 1.005–1.030)
Urobilinogen, Ur: 0.2 mg/dL (ref 0.2–1.0)
pH, UA: 5.5 (ref 5.0–7.5)

## 2024-04-26 LAB — POCT INR: INR: 2.9 (ref 2.0–3.0)

## 2024-04-26 LAB — MICROSCOPIC EXAMINATION
Bacteria, UA: NONE SEEN
Renal Epithel, UA: NONE SEEN /HPF

## 2024-04-26 MED ORDER — TAMSULOSIN HCL 0.4 MG PO CAPS: 0.4000 mg | ORAL_CAPSULE | Freq: Every day | ORAL | 0 refills | Status: AC

## 2024-04-26 MED ORDER — KETOROLAC TROMETHAMINE 30 MG/ML IJ SOLN
30.0000 mg | Freq: Once | INTRAMUSCULAR | Status: AC
  Administered 2024-04-26: 10:00:00 30 mg via INTRAMUSCULAR

## 2024-04-26 NOTE — Patient Instructions (Signed)
Continue warfarin 1 tablet daily except 1 1/2 tablets on Mondays, Wednesdays and Fridays. Recheck 4 wks

## 2024-04-26 NOTE — Progress Notes (Signed)
 INR 2.9; Please see anticoagulation encounter

## 2024-04-26 NOTE — Progress Notes (Signed)
 Subjective:  Patient ID: Danny Hinton, male    DOB: Jun 21, 1980, 43 y.o.   MRN: [id number]  Patient Care Team: Dettinger, Fonda LABOR, MD as PCP - General (Family Medicine) Croitoru, Jerel, MD as PCP - Cardiology (Cardiology) Inocencio Soyla Lunger, MD as PCP - Electrophysiology (Cardiology)   Chief Complaint:  Flank Pain (Left side flank pain, thinks it may be kidney stone )   HPI: Danny Hinton is a 43 y.o. male presenting on 04/26/2024 for Flank Pain (Left side flank pain, thinks it may be kidney stone )   Discussed the use of AI scribe software for clinical note transcription with the patient, who gave verbal consent to proceed.  History of Present Illness Danny Hinton is a 43 year old male who presents with left-sided abdominal pain suggestive of kidney stones.  He has been experiencing left-sided abdominal pain for the past week. The pain originates in the left kidney area and sometimes radiates towards his left lower abdominal quadrant. It is intermittent, lasting several hours on some days, and is absent on others. He rates the pain as a 6 out of 10 at its worst.  He has a history of passing kidney stones, with the first occurrence in September. During that episode, he experienced severe pain that prompted a visit to the hospital, where hematuria was noted, and kidney stones were suspected. He has not taken any for the current episode.  No nausea or vomiting. The pain can feel worse in certain positions. His urine at home has been dark yellow in color.      Relevant past medical, surgical, family, and social history reviewed and updated as indicated.  Allergies and medications reviewed and updated. Data reviewed: Chart in Epic.   Past Medical History:  Diagnosis Date   Anxiety    Atypical atrial flutter (HCC) 08/19/2014   Pace terminated in device clinic.  AFL cycle length was 390 msec.   Automatic implantable cardioverter-defibrillator in situ    a. St Jude in 2011.   B12  nutritional deficiency, B12 = 214 (03/18/19) 04/29/2019   Chronic anticoagulation with Coumadin     Chronic systolic congestive heart failure (HCC)    a. suspected NICM - dx 2010. EF 15% by TEE, 10-20% by echo at that time. b. s/p St. Jude AICD 2011. c. Echo 01/2010: mod dilated LV, EF 30%, mod aortic root dilitation, no significant valvular disease.   Depression    Diabetes mellitus (HCC), Rx Victoza  04/29/2019   Dyslipidemia associated with type 2 diabetes mellitus (HCC), Rx Lipitor 04/29/2019   Eczema    Enlarged aorta    Hypertension associated with diabetes (HCC)    Mobitz type 2 second degree atrioventricular block    a. During sleeping hours in 2010 suspected due to OSA.   Morbid obesity (HCC)    Nonischemic cardiomyopathy (HCC)    OSA treated with BiPAP 09/04/2018   Severe obstructive sleep apnea with an AHI of 67.2/h and nocturnal hypoxemia with oxygen saturations as low as 82%. Now on BIPAP at 18/14 cm H2O.   PAF (paroxysmal atrial fibrillation) (HCC)    Palpitations    Paroxysmal atrial flutter (HCC), s/p ablation    Seasonal allergies    Sleep apnea    Thoracic aortic aneurysm (HCC), monitored annually 04/29/2019   Vitamin D  deficiency, vitamin D  = 6.9 (03/28/19) 04/29/2019    Past Surgical History:  Procedure Laterality Date   ATRIAL FLUTTER ABLATION N/A 01/18/2014   Procedure: ATRIAL FLUTTER  ABLATION;  Surgeon: Elspeth JAYSON Sage, MD;  Location: [facility] CATH LAB;  Service: Cardiovascular;  Laterality: N/A;   CARDIAC DEFIBRILLATOR PLACEMENT  02/17/10   St. Jude Medical 45DR, model number [id number], serial number [id number]   CARDIOVERSION N/A 12/29/2013   Procedure: CARDIOVERSION;  Surgeon: Elspeth JAYSON Sage, MD;  Location: [facility] OR;  Service: Cardiovascular;  Laterality: N/A;   ICD GENERATOR CHANGEOUT N/A 03/24/2020   Procedure: ICD GENERATOR CHANGEOUT;  Surgeon: Francyne Headland, MD;  Location: [facility] INVASIVE CV LAB;  Service: Cardiovascular;  Laterality: N/A;   TOOTH EXTRACTION N/A 10/23/2012    Procedure: EXTRACTION TEETH 1, 16, 17, 30, 31;  Surgeon: Glendia CHRISTELLA Primrose, DDS;  Location: [facility] OR;  Service: Oral Surgery;  Laterality: N/A;    Social History   Socioeconomic History   Marital status: Married    Spouse name: Not on file   Number of children: 1   Years of education: Not on file   Highest education level: 12th grade  Occupational History   Occupation: disabled  Tobacco Use   Smoking status: Never   Smokeless tobacco: Never  Vaping Use   Vaping status: Never Used  Substance and Sexual Activity   Alcohol use: No   Drug use: No   Sexual activity: Yes  Other Topics Concern   Not on file  Social History Narrative   Not on file   Social Drivers of Health   Tobacco Use: Low Risk (04/26/2024)   Patient History    Smoking Tobacco Use: Never    Smokeless Tobacco Use: Never    Passive Exposure: Not on file  Financial Resource Strain: Medium Risk (04/26/2024)   Overall Financial Resource Strain (CARDIA)    Difficulty of Paying Living Expenses: Somewhat hard  Food Insecurity: Food Insecurity Present (04/26/2024)   Epic    Worried About Programme Researcher, Broadcasting/film/video in the Last Year: Never true    Ran Out of Food in the Last Year: Sometimes true  Transportation Needs: No Transportation Needs (04/26/2024)   Epic    Lack of Transportation (Medical): No    Lack of Transportation (Non-Medical): No  Physical Activity: Inactive (04/26/2024)   Exercise Vital Sign    Days of Exercise per Week: 0 days    Minutes of Exercise per Session: Not on file  Stress: Stress Concern Present (04/26/2024)   Danny Hinton [employer]ational [employer] - Occupational Stress Questionnaire    Feeling of Stress: Very much  Social Connections: Moderately Isolated (04/26/2024)   Social Connection and Isolation Panel    Frequency of Communication with Friends and Family: Twice a week    Frequency of Social Gatherings with Friends and Family: Never    Attends Religious Services: 1 to 4 times per year     Active Member of Clubs or Organizations: No    Attends Banker Meetings: Not on file    Marital Status: Married  Catering Manager Violence: Not At Risk (02/09/2024)   Epic    Fear of Current or Ex-Partner: No    Emotionally Abused: No    Physically Abused: No    Sexually Abused: No  Depression (PHQ2-9): High Risk (02/09/2024)   Depression (PHQ2-9)    PHQ-2 Score: 18  Alcohol Screen: Low Risk (04/26/2024)   Alcohol Screen    Last Alcohol Screening Score (AUDIT): 0  Housing: High Risk (04/26/2024)   Epic    Unable to Pay for Housing in the Last Year: Yes    Number of Times Moved in the  Last Year: 0    Homeless in the Last Year: No  Utilities: Not At Risk (02/09/2024)   Epic    Threatened with loss of utilities: No  Health Literacy: Adequate Health Literacy (02/09/2024)   B1300 Health Literacy    Frequency of need for help with medical instructions: Never    Outpatient Encounter Medications as of 04/26/2024  Medication Sig   carvedilol  (COREG ) 25 MG tablet TAKE 1/2 (ONE-HALF) TABLET BY MOUTH TWICE DAILY WITH A MEAL   cyanocobalamin  (VITAMIN B12) 1000 MCG tablet Take 1,000 mcg by mouth daily.   FLUoxetine  (PROZAC ) 20 MG tablet Take 20 mg by mouth daily.   furosemide  (LASIX ) 40 MG tablet Take 1 tablet (40 mg total) by mouth daily as needed.   loratadine  (CLARITIN ) 10 MG tablet Take 1 tablet by mouth in the evening   metoprolol  tartrate (LOPRESSOR ) 25 MG tablet Take 1 tablet as needed for palpitations   Multiple Vitamin (MULTIVITAMIN WITH MINERALS) TABS tablet Take 1 tablet by mouth daily.   ondansetron  (ZOFRAN ) 4 MG tablet Take 1 tablet (4 mg total) by mouth every 6 (six) hours.   sacubitril -valsartan  (ENTRESTO ) 49-51 MG Take 1 tablet by mouth 2 (two) times daily. Please keep appointment in December receive future refills. Thank you!   tamsulosin  (FLOMAX ) 0.4 MG CAPS capsule Take 1 capsule (0.4 mg total) by mouth daily.   Vitamin D , Ergocalciferol , (DRISDOL ) 1.25 MG  (50000 UNIT) CAPS capsule Take 1 capsule (50,000 Units total) by mouth every 7 (seven) days. (Patient taking differently: Take 50,000 Units by mouth 2 (two) times a week. Tues and Thurs)   warfarin (COUMADIN ) 5 MG tablet TAKE 1 TO 1 & 1/2 (ONE & ONE-HALF) TABLETS BY MOUTH ONCE DAILY OR AS DIRECTED BY COUMADIN  CLINIC   amoxicillin -clavulanate (AUGMENTIN ) 875-125 MG tablet Take 1 tablet by mouth 2 (two) times daily. (Patient not taking: Reported on 04/26/2024)   Chlorphen-PE-Acetaminophen  4-10-325 MG TABS Take 1 tablet by mouth every 6 (six) hours as needed. (Patient not taking: Reported on 04/26/2024)   fluticasone  (FLONASE ) 50 MCG/ACT nasal spray Place 1 spray into both nostrils as needed. (Patient not taking: Reported on 04/26/2024)   oxyCODONE -acetaminophen  (PERCOCET/ROXICET) 5-325 MG tablet Take 1 tablet by mouth every 6 (six) hours as needed for severe pain (pain score 7-10). (Patient not taking: Reported on 04/26/2024)   Probiotic Product (VSL#3) CAPS Take twice a day (Patient not taking: Reported on 04/26/2024)   Facility-Administered Encounter Medications as of 04/26/2024  Medication   ketorolac  (TORADOL ) 30 MG/ML injection 30 mg    Allergies[1]  Pertinent ROS per HPI, otherwise unremarkable      Objective:  BP 107/63   Pulse (!) 57   Temp 97.8 F (36.6 C) (Temporal)   Ht 6' 3 (1.905 m)   Wt 256 lb 6.4 oz (116.3 kg)   SpO2 99%   BMI 32.05 kg/m    Wt Readings from Last 3 Encounters:  04/26/24 256 lb 6.4 oz (116.3 kg)  04/19/24 251 lb (113.9 kg)  02/09/24 248 lb (112.5 kg)    Physical Exam Vitals and nursing note reviewed.  Constitutional:      General: He is not in acute distress. HENT:     Head: Normocephalic and atraumatic.     Nose: Nose normal.     Mouth/Throat:     Mouth: Mucous membranes are moist.  Eyes:     General: No scleral icterus.    Extraocular Movements: Extraocular movements intact.     Conjunctiva/sclera: Conjunctivae  normal.     Pupils: Pupils  are equal, round, and reactive to light.  Cardiovascular:     Heart sounds: Normal heart sounds.  Pulmonary:     Effort: Pulmonary effort is normal.     Breath sounds: Normal breath sounds.  Abdominal:     Tenderness: There is left CVA tenderness.  Musculoskeletal:        General: Normal range of motion.  Skin:    General: Skin is warm and dry.  Neurological:     Mental Status: He is alert and oriented to person, place, and time.  Psychiatric:        Mood and Affect: Mood normal.        Behavior: Behavior normal.        Thought Content: Thought content normal.        Judgment: Judgment normal.    Physical Exam      Results for orders placed or performed in visit on 04/26/24  POCT INR   Collection Time: 04/26/24  8:14 AM  Result Value Ref Range   INR 2.9 2.0 - 3.0   POC INR         Pertinent labs & imaging results that were available during my care of the patient were reviewed by me and considered in my medical decision making.  Assessment & Plan:  Ashford was seen today for flank pain.  Diagnoses and all orders for this visit:  Left sided abdominal pain -     Urinalysis, Routine w reflex microscopic -     Urine Culture -     tamsulosin  (FLOMAX ) 0.4 MG CAPS capsule; Take 1 capsule (0.4 mg total) by mouth daily. -     ketorolac  (TORADOL ) 30 MG/ML injection 30 mg  Left flank pain -     Urine Culture -     tamsulosin  (FLOMAX ) 0.4 MG CAPS capsule; Take 1 capsule (0.4 mg total) by mouth daily. -     ketorolac  (TORADOL ) 30 MG/ML injection 30 mg  Urine discoloration -     Urinalysis, Routine w reflex microscopic     Assessment and Plan Check a 43 year old African-American male seen today for left leg pain, no acute distress  Assessment & Plan Nephrolithiasis Intermittent left flank pain radiating to the left lower abdomen, consistent with nephrolithiasis. Urinalysis showed trace blood and WBCs. Previous episode confirmed by hospital. - Administered Toradol   injection for pain management. - Cultured urine for infection. - Prescribed Flomax  to aid stone passage. - Follow up with urine culture results next week; prescribe antibiotics if necessary.  -Increase hydration    Continue all other maintenance medications.  Follow up plan: Return if symptoms worsen or fail to improve.   Continue healthy lifestyle choices, including diet (rich in fruits, vegetables, and lean proteins, and low in salt and simple carbohydrates) and exercise (at least 30 minutes of moderate physical activity daily).  Educational handout given for   Clinical References  Side Pain (Flank Plain) in Adults: What it Means  Flank pain is pain in your side. The flank is the area on your side between your upper belly (abdomen) and your spine. The pain may be sudden, or it may be long-term. It may be mild or very bad. Treatment will depend on what's causing your flank pain. Possible causes of flank pain Pain in this area can be caused by many things, such as: Muscle soreness or injury. Kidney infection, kidney stones, or kidney disease. Stress. A disease of the spine. Lung  problems like: A lung infection (pneumonia). Fluid around the lungs. Shingles. This is a skin rash caused by the chickenpox virus. Problems in the belly like: Trouble pooping (constipation). Appendicitis. Gallbladder disease. Abnormal growth of cells or tissue (tumors). Follow these instructions at home: Drink more fluids as told. Rest as told. Take your medicines only as told. Keep a journal to keep track of: What causes your flank pain. What makes your flank pain feel better. Keep all follow-up visits. Your health care provider will need to check on your condition. Contact a health care provider if: Medicine doesn't help your pain. You have new symptoms. Your pain gets worse. Your symptoms last longer than 2-3 days. You're peeing more often than normal. You have flank pain and a  fever. You feel like you may throw up or you throw up. You have blood in your pee. Get help right away if: You have trouble breathing. You're short of breath. Your belly hurts, or it's swollen or red. You feel like you'll faint, or you faint. These symptoms may be an emergency. Call 911 right away. Do not wait to see if the symptoms will go away. Do not drive yourself to the hospital. This information is not intended to replace advice given to you by your health care provider. Make sure you discuss any questions you have with your health care provider. Document Revised: 08/19/2023 Document Reviewed: 08/19/2023 Elsevier Patient Education  2025 Elsevier Inc. Abdominal Pain, Adult  Many things can cause belly (abdominal) pain. In most cases, belly pain is not a serious problem and can be watched and treated at home. But in some cases, it can be serious. Your doctor will try to find the cause of your belly pain. Follow these instructions at home: Medicines Take over-the-counter and prescription medicines only as told by your doctor. Do not take medicines that help you poop (laxatives) unless told by your doctor. General instructions Watch your belly pain for any changes. Tell your doctor if the pain gets worse. Drink enough fluid to keep your pee (urine) pale yellow. Contact a doctor if: Your belly pain changes or gets worse. You have very bad cramping or bloating in your belly. You vomit. Your pain gets worse with meals, after eating, or with certain foods. You have trouble pooping or have watery poop for more than 2-3 days. You are not hungry, or you lose weight without trying. You have signs of not getting enough fluid or water (dehydration). These may include: Dark pee, very little pee, or no pee. Cracked lips or dry mouth. Feeling sleepy or weak. You have pain when you pee or poop. Your belly pain wakes you up at night. You have blood in your pee. You have a fever. Get help  right away if: You cannot stop vomiting. Your pain is only in one part of your belly, like on the right side. You have bloody or black poop, or poop that looks like tar. You have trouble breathing. You have chest pain. These symptoms may be an emergency. Get help right away. Call 911. Do not wait to see if the symptoms will go away. Do not drive yourself to the hospital. This information is not intended to replace advice given to you by your health care provider. Make sure you discuss any questions you have with your health care provider. Document Revised: 02/10/2022 Document Reviewed: 02/10/2022 Elsevier Patient Education  2024 Elsevier Inc.656  The above assessment and management plan was discussed with the patient. The  patient verbalized understanding of and has agreed to the management plan. Patient is aware to call the clinic if they develop any new symptoms or if symptoms persist or worsen. Patient is aware when to return to the clinic for a follow-up visit. Patient educated on when it is appropriate to go to the emergency department.   Kaeson Kleinert [place] Thompson, DNP [facility] [address] [place], [place] [address] [phone]       [1] No Known Allergies

## 2024-04-30 ENCOUNTER — Ambulatory Visit: Payer: Self-pay | Admitting: Nurse Practitioner

## 2024-05-16 ENCOUNTER — Telehealth: Payer: Self-pay | Admitting: Cardiovascular Disease

## 2024-05-16 DIAGNOSIS — I7121 Aneurysm of the ascending aorta, without rupture: Secondary | ICD-10-CM

## 2024-05-16 NOTE — Telephone Encounter (Signed)
 Yes, please go ahead and order a CT angiogram of the aorta for ascending aortic aneurysm.  Thank you

## 2024-05-16 NOTE — Telephone Encounter (Signed)
 Spoke with patient and he reports that he thinks he might be due for a ct of his aorta. Last result says to complete yearly. Do you want this ordered?

## 2024-05-16 NOTE — Telephone Encounter (Signed)
" °  The patient would like to follow up on the test that Dr. JAYSON recommended. There is no order on file. Last office visit 04/19/24 "

## 2024-05-21 NOTE — Telephone Encounter (Signed)
 CTA chest/Aorta has been ordered, will send the pt a [facility] message

## 2024-05-25 ENCOUNTER — Telehealth: Payer: Self-pay | Admitting: Cardiovascular Disease

## 2024-05-25 NOTE — Telephone Encounter (Signed)
 Pt requesting a sleep study order so he can follow up with Dr. Shlomo.

## 2024-05-28 ENCOUNTER — Ambulatory Visit: Payer: Medicare (Managed Care) | Attending: Cardiovascular Disease | Admitting: *Deleted

## 2024-05-28 DIAGNOSIS — Z5181 Encounter for therapeutic drug level monitoring: Secondary | ICD-10-CM | POA: Diagnosis not present

## 2024-05-28 DIAGNOSIS — I480 Paroxysmal atrial fibrillation: Secondary | ICD-10-CM | POA: Diagnosis not present

## 2024-05-28 LAB — POCT INR: INR: 3.7 — AB (ref 2.0–3.0)

## 2024-05-28 NOTE — Patient Instructions (Signed)
 Hold warfarin tonight then resume 1 tablet daily except 1 1/2 tablets on Mondays, Wednesdays and Fridays.  Recheck 4 wks

## 2024-05-28 NOTE — Progress Notes (Signed)
 INR 3.7 Please see anticoagulation encounter

## 2024-05-29 ENCOUNTER — Ambulatory Visit (HOSPITAL_COMMUNITY)
Admission: RE | Admit: 2024-05-29 | Discharge: 2024-05-29 | Disposition: A | Discharge status: Home / Self Care | Payer: Medicare (Managed Care) | Source: Ambulatory Visit | Attending: Cardiology | Admitting: Cardiology

## 2024-05-29 ENCOUNTER — Ambulatory Visit: Payer: Self-pay | Admitting: Cardiovascular Disease

## 2024-05-29 DIAGNOSIS — E1169 Type 2 diabetes mellitus with other specified complication: Secondary | ICD-10-CM | POA: Insufficient documentation

## 2024-05-29 DIAGNOSIS — I7121 Aneurysm of the ascending aorta, without rupture: Secondary | ICD-10-CM | POA: Diagnosis present

## 2024-05-29 LAB — POCT I-STAT CREATININE: Creatinine, Ser: 0.9 mg/dL (ref 0.61–1.24)

## 2024-05-29 MED ORDER — IOHEXOL 350 MG/ML SOLN
100.0000 mL | Freq: Once | INTRAVENOUS | Status: AC | PRN
  Administered 2024-05-29: 100 mL via INTRAVENOUS

## 2024-05-30 NOTE — Telephone Encounter (Signed)
 Patient needs an appointment to see dr Shlomo to get restarted.

## 2024-05-31 ENCOUNTER — Other Ambulatory Visit: Payer: Self-pay | Admitting: Cardiovascular Disease

## 2024-05-31 DIAGNOSIS — I4719 Other supraventricular tachycardia: Secondary | ICD-10-CM

## 2024-06-19 ENCOUNTER — Ambulatory Visit: Payer: Medicaid Other

## 2024-06-25 ENCOUNTER — Ambulatory Visit: Payer: Medicare (Managed Care)

## 2024-09-18 ENCOUNTER — Ambulatory Visit: Payer: Medicare (Managed Care)

## 2024-12-18 ENCOUNTER — Ambulatory Visit: Payer: Medicare (Managed Care)

## 2025-02-11 ENCOUNTER — Ambulatory Visit: Payer: Self-pay

## 2025-03-19 ENCOUNTER — Ambulatory Visit: Payer: Medicare (Managed Care)

## 2025-06-18 ENCOUNTER — Ambulatory Visit: Payer: Medicare (Managed Care)

## 2025-09-17 ENCOUNTER — Ambulatory Visit: Payer: Medicare (Managed Care)
